# Patient Record
Sex: Male | Born: 1955
Health system: Southern US, Community
[De-identification: ages and names within clinical notes are randomized; demographics above are authoritative.]

## PROBLEM LIST (undated history)

## (undated) DIAGNOSIS — I509 Heart failure, unspecified: Secondary | ICD-10-CM

## (undated) DIAGNOSIS — M1990 Unspecified osteoarthritis, unspecified site: Secondary | ICD-10-CM

## (undated) DIAGNOSIS — I10 Essential (primary) hypertension: Secondary | ICD-10-CM

## (undated) DIAGNOSIS — E871 Hypo-osmolality and hyponatremia: Secondary | ICD-10-CM

## (undated) DIAGNOSIS — C61 Malignant neoplasm of prostate: Secondary | ICD-10-CM

## (undated) DIAGNOSIS — K219 Gastro-esophageal reflux disease without esophagitis: Secondary | ICD-10-CM

## (undated) HISTORY — PX: SHOULDER SURGERY: SHX246

---

## 1999-09-05 ENCOUNTER — Emergency Department (HOSPITAL_COMMUNITY): Admission: EM | Admit: 1999-09-05 | Discharge: 1999-09-05 | Payer: Self-pay | Admitting: Emergency Medicine

## 2000-08-14 ENCOUNTER — Ambulatory Visit (HOSPITAL_COMMUNITY): Admission: RE | Admit: 2000-08-14 | Discharge: 2000-08-14 | Payer: Self-pay | Admitting: Gastroenterology

## 2000-08-14 ENCOUNTER — Encounter (INDEPENDENT_AMBULATORY_CARE_PROVIDER_SITE_OTHER): Payer: Self-pay | Admitting: *Deleted

## 2004-08-28 ENCOUNTER — Emergency Department (HOSPITAL_COMMUNITY): Admission: EM | Admit: 2004-08-28 | Discharge: 2004-08-28 | Payer: Self-pay | Admitting: Advanced Practice Midwife

## 2007-08-27 ENCOUNTER — Emergency Department (HOSPITAL_COMMUNITY): Admission: EM | Admit: 2007-08-27 | Discharge: 2007-08-28 | Payer: Self-pay | Admitting: Emergency Medicine

## 2010-07-29 ENCOUNTER — Emergency Department (HOSPITAL_COMMUNITY): Admission: EM | Admit: 2010-07-29 | Discharge: 2010-07-29 | Payer: Self-pay | Admitting: Emergency Medicine

## 2011-04-26 NOTE — Procedures (Signed)
[place]  Patient:    Austin [name], Austin [name]                     MRN: [id number] Proc. Date: 08/14/00 Adm. Date:  [id number] Attending:  Charna Elizabeth CC:         Kern Reap, M.D.   Procedure Report  DATE OF BIRTH:  [birth year]  REFERRING PHYSICIAN:  Kern Reap, M.D.  PROCEDURE PERFORMED:  Colonoscopy with biopsy x 1.  ENDOSCOPIST:  Anselmo Rod, M.D.  INSTRUMENT USED:  Olympus video colonoscope.  INDICATIONS FOR PROCEDURE:  Rectal bleeding in a 54 year old black male, rule out colonic polyps, masses, hemorrhoids, etc.  PREPROCEDURE PREPARATION:  Informed consent was procured from the patient. The patient was fasted for eight hours prior to the procedure and prepped with a bottle of magnesium citrate and a gallon of NuLytely the night prior to the procedure.  PREPROCEDURE PHYSICAL:  The patient had stable vital signs.  Neck supple. Chest clear to auscultation.  S1, S2 regular.  Abdomen soft with normal abdominal bowel sounds.  DESCRIPTION OF PROCEDURE:  The patient was placed in the left lateral decubitus position and sedated with 75 mg of Demerol and 7 mg of Versed intravenously.  Once the patient was adequately sedated and maintained on low-flow oxygen and continuous cardiac monitoring, the Olympus video colonoscope was advanced from the rectum to the cecum without difficulty.  The entire colonic mucosa appeared healthy with a normal vascular pattern except for one small sessile polyp that was removed by core biopsy from 10 cm.  There were moderate sized internal hemorrhoids.  The patient tolerated the procedure well without complications.  No large masses or polyps were seen.  IMPRESSION: 1. Healthy-appearing colon except for a small sessile polyp removed by cold    biopsy forceps from 10 cm. 2. Moderate sized nonbleeding internal hemorrhoids. 3. Otherwise normal-appearing colon up to cecum.  RECOMMENDATIONS: 1. The  patient has been advised to increase the fluid and fiber in his diet. 2. Await pathology results. 3. Outpatient follow-up in the next two weeks.DD:  08/14/00 TD:  08/15/00 Job: [id number]

## 2011-09-19 LAB — URINALYSIS, ROUTINE W REFLEX MICROSCOPIC
Glucose, UA: NEGATIVE
Hgb urine dipstick: NEGATIVE
Ketones, ur: >80 — AB
Protein, ur: 30 — AB

## 2011-09-19 LAB — LIPASE, BLOOD: Lipase: 25

## 2011-09-19 LAB — DIFFERENTIAL
Basophils Absolute: 0
Eosinophils Absolute: 0
Monocytes Absolute: 0.3
Neutrophils Relative %: 92 — ABNORMAL HIGH

## 2011-09-19 LAB — BASIC METABOLIC PANEL
CO2: 27
Calcium: 9.1
Chloride: 94 — ABNORMAL LOW
GFR calc Af Amer: >60
Glucose, Bld: 120 — ABNORMAL HIGH
Potassium: 3.9
Sodium: 134 — ABNORMAL LOW

## 2011-09-19 LAB — CBC
HCT: 38.2 — ABNORMAL LOW
Hemoglobin: 13.2
MCHC: 34.6
Platelets: 198
RBC: 4.15 — ABNORMAL LOW
RDW: 14.6 — ABNORMAL HIGH

## 2011-09-19 LAB — URINE MICROSCOPIC-ADD ON

## 2014-01-12 ENCOUNTER — Other Ambulatory Visit: Payer: Self-pay | Admitting: Urology

## 2014-01-19 ENCOUNTER — Encounter (HOSPITAL_COMMUNITY): Payer: Self-pay | Admitting: Pharmacy Technician

## 2014-01-24 ENCOUNTER — Encounter (HOSPITAL_COMMUNITY)
Admission: RE | Admit: 2014-01-24 | Discharge: 2014-01-24 | Disposition: A | Discharge status: Home / Self Care | Payer: 59 | Source: Ambulatory Visit | Attending: Urology | Admitting: Urology

## 2014-01-24 ENCOUNTER — Encounter (HOSPITAL_COMMUNITY): Payer: Self-pay

## 2014-01-24 ENCOUNTER — Ambulatory Visit (HOSPITAL_COMMUNITY)
Admission: RE | Admit: 2014-01-24 | Discharge: 2014-01-24 | Disposition: A | Discharge status: Home / Self Care | Payer: 59 | Source: Ambulatory Visit | Attending: Urology | Admitting: Urology

## 2014-01-24 DIAGNOSIS — Z01810 Encounter for preprocedural cardiovascular examination: Secondary | ICD-10-CM | POA: Insufficient documentation

## 2014-01-24 DIAGNOSIS — Z01812 Encounter for preprocedural laboratory examination: Secondary | ICD-10-CM | POA: Insufficient documentation

## 2014-01-24 HISTORY — DX: Gastro-esophageal reflux disease without esophagitis: K21.9

## 2014-01-24 HISTORY — DX: Malignant neoplasm of prostate: C61

## 2014-01-24 HISTORY — DX: Essential (primary) hypertension: I10

## 2014-01-24 HISTORY — DX: Unspecified osteoarthritis, unspecified site: M19.90

## 2014-01-24 LAB — BASIC METABOLIC PANEL
BUN: 13 mg/dL (ref 6–23)
CHLORIDE: 97 meq/L (ref 96–112)
CO2: 26 meq/L (ref 19–32)
Calcium: 9.4 mg/dL (ref 8.4–10.5)
Creatinine, Ser: 1.04 mg/dL (ref 0.50–1.35)
GFR calc Af Amer: 90 mL/min — ABNORMAL LOW (ref >90)
GFR, EST NON AFRICAN AMERICAN: 77 mL/min — AB (ref >90)
GLUCOSE: 85 mg/dL (ref 70–99)
POTASSIUM: 3.9 meq/L (ref 3.7–5.3)
Sodium: 138 mEq/L (ref 137–147)

## 2014-01-24 LAB — CBC
HCT: 32.3 % — ABNORMAL LOW (ref 39.0–52.0)
HEMOGLOBIN: 10.9 g/dL — AB (ref 13.0–17.0)
MCH: 32.2 pg (ref 26.0–34.0)
MCHC: 33.7 g/dL (ref 30.0–36.0)
MCV: 95.6 fL (ref 78.0–100.0)
PLATELETS: 250 10*3/uL (ref 150–400)
RBC: 3.38 MIL/uL — AB (ref 4.22–5.81)
RDW: 12.9 % (ref 11.5–15.5)
WBC: 7.1 10*3/uL (ref 4.0–10.5)

## 2014-01-24 LAB — ABO/RH: ABO/RH(D): B NEG

## 2014-01-24 NOTE — Patient Instructions (Addendum)
19 Austin [name]  01/24/2014   Your procedure is scheduled on: 01/26/14  Report to [facility] at 06:30 AM.  Call this number if you have problems the morning of surgery [phone]: [phone]   Remember:   Do not eat food or drink liquids After Midnight.   Do not wear jewelry, make-up or nail polish.  Do not wear lotions, powders, or perfumes. You may wear deodorant.  Do not shave 48 hours prior to surgery. Men may shave face and neck.  Do not bring valuables to the hospital.  Contacts, dentures or bridgework may not be worn into surgery.  Leave suitcase in the car. After surgery it may be brought to your room.  For patients admitted to the hospital, checkout time is 11:00 AM the day of discharge.    Please read over the following fact sheets that you were given:[name] preparing for surgery sheet, blood fact sheet [name], RN  pre op nurse call if needed [phone]    FAILURE TO [name]   Patient Signature: ___________________________________________

## 2014-01-26 ENCOUNTER — Encounter (HOSPITAL_COMMUNITY)
Admission: RE | Disposition: A | Discharge status: Home / Self Care | Payer: Self-pay | Source: Ambulatory Visit | Attending: Urology

## 2014-01-26 ENCOUNTER — Inpatient Hospital Stay (HOSPITAL_COMMUNITY): Payer: 59 | Admitting: Certified Registered Nurse Anesthetist

## 2014-01-26 ENCOUNTER — Encounter (HOSPITAL_COMMUNITY): Payer: Self-pay | Admitting: Certified Registered Nurse Anesthetist

## 2014-01-26 ENCOUNTER — Inpatient Hospital Stay (HOSPITAL_COMMUNITY)
Admission: RE | Admit: 2014-01-26 | Discharge: 2014-01-27 | DRG: 708 | Disposition: A | Discharge status: Home / Self Care | Payer: 59 | Source: Ambulatory Visit | Attending: Urology | Admitting: Urology

## 2014-01-26 ENCOUNTER — Encounter (HOSPITAL_COMMUNITY): Payer: 59 | Admitting: Certified Registered Nurse Anesthetist

## 2014-01-26 DIAGNOSIS — I10 Essential (primary) hypertension: Secondary | ICD-10-CM | POA: Diagnosis present

## 2014-01-26 DIAGNOSIS — C61 Malignant neoplasm of prostate: Principal | ICD-10-CM | POA: Diagnosis present

## 2014-01-26 DIAGNOSIS — K219 Gastro-esophageal reflux disease without esophagitis: Secondary | ICD-10-CM | POA: Diagnosis present

## 2014-01-26 DIAGNOSIS — N529 Male erectile dysfunction, unspecified: Secondary | ICD-10-CM | POA: Diagnosis present

## 2014-01-26 DIAGNOSIS — Z79899 Other long term (current) drug therapy: Secondary | ICD-10-CM

## 2014-01-26 HISTORY — PX: ROBOT ASSISTED LAPAROSCOPIC RADICAL PROSTATECTOMY: SHX5141

## 2014-01-26 HISTORY — PX: LYMPHADENECTOMY: SHX5960

## 2014-01-26 LAB — TYPE AND SCREEN
ABO/RH(D): B NEG
ANTIBODY SCREEN: NEGATIVE

## 2014-01-26 LAB — HEMOGLOBIN AND HEMATOCRIT, BLOOD
HCT: 31.5 % — ABNORMAL LOW (ref 39.0–52.0)
Hemoglobin: 10.5 g/dL — ABNORMAL LOW (ref 13.0–17.0)

## 2014-01-26 SURGERY — ROBOTIC ASSISTED LAPAROSCOPIC RADICAL PROSTATECTOMY
Anesthesia: General

## 2014-01-26 MED ORDER — KCL IN DEXTROSE-NACL 20-5-0.45 MEQ/L-%-% IV SOLN
INTRAVENOUS | Status: DC
  Administered 2014-01-26 – 2014-01-27 (×2): via INTRAVENOUS
  Filled 2014-01-26 (×4): qty 1000

## 2014-01-26 MED ORDER — PROPOFOL 10 MG/ML IV BOLUS
INTRAVENOUS | Status: AC
  Filled 2014-01-26: qty 20

## 2014-01-26 MED ORDER — HYDROCODONE-ACETAMINOPHEN 5-325 MG PO TABS: 1.0000 | ORAL_TABLET | Freq: Four times a day (QID) | ORAL | Status: DC | PRN

## 2014-01-26 MED ORDER — DEXAMETHASONE SODIUM PHOSPHATE 10 MG/ML IJ SOLN
INTRAMUSCULAR | Status: DC | PRN
  Administered 2014-01-26: 10 mg via INTRAVENOUS

## 2014-01-26 MED ORDER — LOSARTAN POTASSIUM 50 MG PO TABS
100.0000 mg | ORAL_TABLET | Freq: Every day | ORAL | Status: DC
  Administered 2014-01-26 – 2014-01-27 (×2): 100 mg via ORAL
  Filled 2014-01-26 (×3): qty 2

## 2014-01-26 MED ORDER — KETOROLAC TROMETHAMINE 30 MG/ML IJ SOLN: 15.0000 mg | Freq: Once | INTRAMUSCULAR | Status: DC | PRN

## 2014-01-26 MED ORDER — FENTANYL CITRATE 0.05 MG/ML IJ SOLN
INTRAMUSCULAR | Status: AC
  Filled 2014-01-26: qty 5

## 2014-01-26 MED ORDER — ONDANSETRON HCL 4 MG/2ML IJ SOLN
INTRAMUSCULAR | Status: DC | PRN
  Administered 2014-01-26 (×2): 2 mg via INTRAVENOUS

## 2014-01-26 MED ORDER — SODIUM CHLORIDE 0.9 % IV SOLN
Freq: Once | INTRAVENOUS | Status: AC
  Administered 2014-01-26: 1000 mL via INTRAVENOUS

## 2014-01-26 MED ORDER — ONDANSETRON HCL 4 MG/2ML IJ SOLN
INTRAMUSCULAR | Status: AC
  Filled 2014-01-26: qty 2

## 2014-01-26 MED ORDER — HYDRALAZINE HCL 20 MG/ML IJ SOLN
INTRAMUSCULAR | Status: DC | PRN
  Administered 2014-01-26: 10 mg via INTRAVENOUS

## 2014-01-26 MED ORDER — SUCCINYLCHOLINE CHLORIDE 20 MG/ML IJ SOLN
INTRAMUSCULAR | Status: DC | PRN
  Administered 2014-01-26: 100 mg via INTRAVENOUS

## 2014-01-26 MED ORDER — EPHEDRINE SULFATE 50 MG/ML IJ SOLN
INTRAMUSCULAR | Status: AC
  Filled 2014-01-26: qty 1

## 2014-01-26 MED ORDER — LIDOCAINE HCL (CARDIAC) 20 MG/ML IV SOLN
INTRAVENOUS | Status: DC | PRN
  Administered 2014-01-26: 100 mg via INTRAVENOUS

## 2014-01-26 MED ORDER — HYDROCHLOROTHIAZIDE 25 MG PO TABS
25.0000 mg | ORAL_TABLET | Freq: Every day | ORAL | Status: DC
  Administered 2014-01-26 – 2014-01-27 (×2): 25 mg via ORAL
  Filled 2014-01-26 (×3): qty 1

## 2014-01-26 MED ORDER — MIDAZOLAM HCL 2 MG/2ML IJ SOLN
INTRAMUSCULAR | Status: AC
  Filled 2014-01-26: qty 2

## 2014-01-26 MED ORDER — ATROPINE SULFATE 0.4 MG/ML IJ SOLN
INTRAMUSCULAR | Status: AC
  Filled 2014-01-26: qty 1

## 2014-01-26 MED ORDER — ACETAMINOPHEN 500 MG PO TABS
1000.0000 mg | ORAL_TABLET | Freq: Four times a day (QID) | ORAL | Status: AC
  Administered 2014-01-26 – 2014-01-27 (×3): 1000 mg via ORAL
  Filled 2014-01-26 (×3): qty 2

## 2014-01-26 MED ORDER — SENNA 8.6 MG PO TABS
1.0000 | ORAL_TABLET | Freq: Two times a day (BID) | ORAL | Status: DC
  Administered 2014-01-27 (×2): 8.6 mg via ORAL
  Filled 2014-01-26 (×2): qty 1

## 2014-01-26 MED ORDER — GLYCOPYRROLATE 0.2 MG/ML IJ SOLN
INTRAMUSCULAR | Status: DC | PRN
  Administered 2014-01-26: 0.2 mg via INTRAVENOUS
  Administered 2014-01-26: .2 mg via INTRAVENOUS

## 2014-01-26 MED ORDER — ONDANSETRON HCL 4 MG/2ML IJ SOLN: 4.0000 mg | INTRAMUSCULAR | Status: DC | PRN

## 2014-01-26 MED ORDER — HYDROMORPHONE HCL PF 1 MG/ML IJ SOLN
INTRAMUSCULAR | Status: AC
  Filled 2014-01-26: qty 1

## 2014-01-26 MED ORDER — CISATRACURIUM BESYLATE (PF) 10 MG/5ML IV SOLN
INTRAVENOUS | Status: DC | PRN
  Administered 2014-01-26: 2 mg via INTRAVENOUS
  Administered 2014-01-26 (×2): 4 mg via INTRAVENOUS
  Administered 2014-01-26 (×2): 2 mg via INTRAVENOUS
  Administered 2014-01-26: 10 mg via INTRAVENOUS

## 2014-01-26 MED ORDER — HYDROMORPHONE HCL PF 1 MG/ML IJ SOLN
0.5000 mg | INTRAMUSCULAR | Status: DC | PRN
  Administered 2014-01-26 – 2014-01-27 (×2): 1 mg via INTRAVENOUS
  Filled 2014-01-26 (×2): qty 1

## 2014-01-26 MED ORDER — CIPROFLOXACIN HCL 500 MG PO TABS: 500.0000 mg | ORAL_TABLET | Freq: Two times a day (BID) | ORAL | Status: DC

## 2014-01-26 MED ORDER — NEOSTIGMINE METHYLSULFATE 1 MG/ML IJ SOLN
INTRAMUSCULAR | Status: AC
  Filled 2014-01-26: qty 10

## 2014-01-26 MED ORDER — HYDRALAZINE HCL 20 MG/ML IJ SOLN
INTRAMUSCULAR | Status: AC
  Filled 2014-01-26: qty 1

## 2014-01-26 MED ORDER — PROMETHAZINE HCL 25 MG/ML IJ SOLN: 6.2500 mg | INTRAMUSCULAR | Status: DC | PRN

## 2014-01-26 MED ORDER — LABETALOL HCL 5 MG/ML IV SOLN
INTRAVENOUS | Status: DC | PRN
  Administered 2014-01-26 (×2): 2.5 mg via INTRAVENOUS
  Administered 2014-01-26: 5 mg via INTRAVENOUS

## 2014-01-26 MED ORDER — HYDROMORPHONE HCL PF 1 MG/ML IJ SOLN
0.2500 mg | INTRAMUSCULAR | Status: DC | PRN
  Administered 2014-01-26 (×2): 0.5 mg via INTRAVENOUS

## 2014-01-26 MED ORDER — NEOSTIGMINE METHYLSULFATE 1 MG/ML IJ SOLN
INTRAMUSCULAR | Status: DC | PRN
  Administered 2014-01-26: 4 mg via INTRAVENOUS

## 2014-01-26 MED ORDER — FENTANYL CITRATE 0.05 MG/ML IJ SOLN
INTRAMUSCULAR | Status: DC | PRN
  Administered 2014-01-26: 50 ug via INTRAVENOUS
  Administered 2014-01-26: 150 ug via INTRAVENOUS
  Administered 2014-01-26: 100 ug via INTRAVENOUS
  Administered 2014-01-26 (×4): 50 ug via INTRAVENOUS

## 2014-01-26 MED ORDER — GLYCOPYRROLATE 0.2 MG/ML IJ SOLN
INTRAMUSCULAR | Status: AC
  Filled 2014-01-26: qty 3

## 2014-01-26 MED ORDER — BUPIVACAINE LIPOSOME 1.3 % IJ SUSP
20.0000 mL | Freq: Once | INTRAMUSCULAR | Status: DC
  Filled 2014-01-26: qty 20

## 2014-01-26 MED ORDER — LACTATED RINGERS IR SOLN
Status: DC | PRN
  Administered 2014-01-26: 1000 mL

## 2014-01-26 MED ORDER — LACTATED RINGERS IV SOLN
INTRAVENOUS | Status: DC | PRN
  Administered 2014-01-26 (×3): via INTRAVENOUS

## 2014-01-26 MED ORDER — CEFAZOLIN SODIUM-DEXTROSE 2-3 GM-% IV SOLR
INTRAVENOUS | Status: AC
  Filled 2014-01-26: qty 50

## 2014-01-26 MED ORDER — KCL IN DEXTROSE-NACL 20-5-0.45 MEQ/L-%-% IV SOLN
INTRAVENOUS | Status: AC
  Filled 2014-01-26: qty 1000

## 2014-01-26 MED ORDER — PROPOFOL 10 MG/ML IV BOLUS
INTRAVENOUS | Status: DC | PRN
  Administered 2014-01-26: 200 mg via INTRAVENOUS
  Administered 2014-01-26 (×2): 25 mg via INTRAVENOUS

## 2014-01-26 MED ORDER — LABETALOL HCL 5 MG/ML IV SOLN
INTRAVENOUS | Status: AC
  Filled 2014-01-26: qty 4

## 2014-01-26 MED ORDER — BUPIVACAINE LIPOSOME 1.3 % IJ SUSP
INTRAMUSCULAR | Status: DC | PRN
  Administered 2014-01-26: 20 mL

## 2014-01-26 MED ORDER — CISATRACURIUM BESYLATE 20 MG/10ML IV SOLN
INTRAVENOUS | Status: AC
  Filled 2014-01-26: qty 10

## 2014-01-26 MED ORDER — LIDOCAINE HCL (CARDIAC) 20 MG/ML IV SOLN
INTRAVENOUS | Status: AC
  Filled 2014-01-26: qty 5

## 2014-01-26 MED ORDER — SODIUM CHLORIDE 0.9 % IJ SOLN
INTRAMUSCULAR | Status: AC
  Filled 2014-01-26: qty 10

## 2014-01-26 MED ORDER — CEFAZOLIN SODIUM-DEXTROSE 2-3 GM-% IV SOLR
2.0000 g | INTRAVENOUS | Status: AC
  Administered 2014-01-26: 2 g via INTRAVENOUS

## 2014-01-26 MED ORDER — OXYCODONE HCL 5 MG PO TABS
5.0000 mg | ORAL_TABLET | ORAL | Status: DC | PRN
  Administered 2014-01-26 – 2014-01-27 (×4): 5 mg via ORAL
  Filled 2014-01-26 (×4): qty 1

## 2014-01-26 MED ORDER — DOCUSATE SODIUM 100 MG PO CAPS
100.0000 mg | ORAL_CAPSULE | Freq: Two times a day (BID) | ORAL | Status: DC
  Administered 2014-01-27 (×2): 100 mg via ORAL
  Filled 2014-01-26 (×3): qty 1

## 2014-01-26 MED ORDER — SODIUM CHLORIDE 0.9 % IJ SOLN
INTRAMUSCULAR | Status: AC
  Filled 2014-01-26: qty 20

## 2014-01-26 MED ORDER — MIDAZOLAM HCL 5 MG/5ML IJ SOLN
INTRAMUSCULAR | Status: DC | PRN
  Administered 2014-01-26: 0.5 mg via INTRAVENOUS
  Administered 2014-01-26: 1 mg via INTRAVENOUS
  Administered 2014-01-26: 0.5 mg via INTRAVENOUS

## 2014-01-26 MED ORDER — FENTANYL CITRATE 0.05 MG/ML IJ SOLN
INTRAMUSCULAR | Status: AC
  Filled 2014-01-26: qty 2

## 2014-01-26 MED ORDER — INDOCYANINE GREEN 25 MG IV SOLR
INTRAVENOUS | Status: DC | PRN
  Administered 2014-01-26: .4 mg

## 2014-01-26 MED ORDER — LOSARTAN POTASSIUM-HCTZ 100-25 MG PO TABS: 1.0000 | ORAL_TABLET | Freq: Every morning | ORAL | Status: DC

## 2014-01-26 MED ORDER — DEXAMETHASONE SODIUM PHOSPHATE 10 MG/ML IJ SOLN
INTRAMUSCULAR | Status: AC
  Filled 2014-01-26: qty 1

## 2014-01-26 SURGICAL SUPPLY — 54 items
ADH SKN CLS APL DERMABOND .7 (GAUZE/BANDAGES/DRESSINGS) ×2
CABLE HIGH FREQUENCY MONO STRZ (ELECTRODE) ×4 IMPLANT
CANISTER SUCTION 2500CC (MISCELLANEOUS) ×4 IMPLANT
CATH FOLEY 2WAY SLVR 18FR 30CC (CATHETERS) ×4 IMPLANT
CATH TIEMANN FOLEY 18FR 5CC (CATHETERS) ×4 IMPLANT
CHLORAPREP W/TINT 26ML (MISCELLANEOUS) ×4 IMPLANT
CLIP LIGATING HEM O LOK PURPLE (MISCELLANEOUS) ×8 IMPLANT
CLIP LIGATING HEMO LOK XL GOLD (MISCELLANEOUS) ×4 IMPLANT
CLOTH BEACON ORANGE TIMEOUT ST (SAFETY) ×4 IMPLANT
CONT SPECI 4OZ STER CLIK (MISCELLANEOUS) ×4 IMPLANT
COVER SURGICAL LIGHT HANDLE (MISCELLANEOUS) ×4 IMPLANT
COVER TIP SHEARS 8 DVNC (MISCELLANEOUS) ×2 IMPLANT
COVER TIP SHEARS 8MM DA VINCI (MISCELLANEOUS) ×2
CUTTER ECHEON FLEX ENDO 45 340 (ENDOMECHANICALS) ×4 IMPLANT
DECANTER SPIKE VIAL GLASS SM (MISCELLANEOUS) ×4 IMPLANT
DERMABOND ADVANCED (GAUZE/BANDAGES/DRESSINGS) ×2
DERMABOND ADVANCED .7 DNX12 (GAUZE/BANDAGES/DRESSINGS) ×2 IMPLANT
DRAPE SURG IRRIG POUCH 19X23 (DRAPES) ×4 IMPLANT
DRSG TEGADERM 2-3/8X2-3/4 SM (GAUZE/BANDAGES/DRESSINGS) ×16 IMPLANT
DRSG TEGADERM 4X4.75 (GAUZE/BANDAGES/DRESSINGS) ×8 IMPLANT
DRSG TEGADERM 6X8 (GAUZE/BANDAGES/DRESSINGS) ×8 IMPLANT
ELECT REM PT RETURN 9FT ADLT (ELECTROSURGICAL) ×4
ELECTRODE REM PT RTRN 9FT ADLT (ELECTROSURGICAL) ×2 IMPLANT
GAUZE SPONGE 2X2 8PLY STRL LF (GAUZE/BANDAGES/DRESSINGS) ×2 IMPLANT
GLOVE BIO SURGEON STRL SZ 6.5 (GLOVE) ×3 IMPLANT
GLOVE BIO SURGEONS STRL SZ 6.5 (GLOVE) ×1
GLOVE BIOGEL M STRL SZ7.5 (GLOVE) ×12 IMPLANT
GOWN STRL REUS W/TWL LRG LVL3 (GOWN DISPOSABLE) ×8 IMPLANT
GOWN STRL REUS W/TWL XL LVL3 (GOWN DISPOSABLE) ×8 IMPLANT
HEMOSTAT SURGICEL 4X8 (HEMOSTASIS) ×4 IMPLANT
HOLDER FOLEY CATH W/STRAP (MISCELLANEOUS) ×4 IMPLANT
IV LACTATED RINGERS 1000ML (IV SOLUTION) ×4 IMPLANT
KIT ACCESSORY DA VINCI DISP (KITS) ×2
KIT ACCESSORY DVNC DISP (KITS) ×2 IMPLANT
KIT PROCEDURE DA VINCI SI (MISCELLANEOUS) ×2
KIT PROCEDURE DVNC SI (MISCELLANEOUS) ×2 IMPLANT
NEEDLE INSUFFLATION 14GA 120MM (NEEDLE) ×4 IMPLANT
NEEDLE SPNL 22GX7 SPINOC (NEEDLE) ×4 IMPLANT
PACK ROBOT UROLOGY CUSTOM (CUSTOM PROCEDURE TRAY) ×4 IMPLANT
RELOAD GREEN ECHELON 45 (STAPLE) ×4 IMPLANT
SET TUBE IRRIG SUCTION NO TIP (IRRIGATION / IRRIGATOR) ×4 IMPLANT
SOLUTION ELECTROLUBE (MISCELLANEOUS) ×4 IMPLANT
SPONGE GAUZE 2X2 STER 10/PKG (GAUZE/BANDAGES/DRESSINGS) ×2
SPONGE LAP 4X18 X RAY DECT (DISPOSABLE) ×4 IMPLANT
SUT ETHILON 3 0 PS 1 (SUTURE) ×4 IMPLANT
SUT MNCRL AB 4-0 PS2 18 (SUTURE) ×8 IMPLANT
SUT PDS AB 1 CT1 27 (SUTURE) ×8 IMPLANT
SUT VICRYL 0 UR6 27IN ABS (SUTURE) ×4 IMPLANT
SUT VLOC BARB 180 ABS3/0GR12 (SUTURE) ×12
SUTURE VLOC BRB 180 ABS3/0GR12 (SUTURE) ×6 IMPLANT
SYR 27GX1/2 1ML LL SAFETY (SYRINGE) ×4 IMPLANT
TOWEL OR NON WOVEN STRL DISP B (DISPOSABLE) ×4 IMPLANT
TROCAR 12M 150ML BLUNT (TROCAR) ×4 IMPLANT
WATER STERILE IRR 1500ML POUR (IV SOLUTION) ×8 IMPLANT

## 2014-01-26 NOTE — Transfer of Care (Signed)
Immediate Anesthesia Transfer of Care Note  Patient: Austin [name]  Procedure(s) Performed: Procedure(s): ROBOTIC ASSISTED LAPAROSCOPIC RADICAL PROSTATECTOMY (N/A) LYMPHADENECTOMY WITH INDOCYANINE GREEN DYE (Bilateral)  Patient Location: PACU  Anesthesia Type:General  Level of Consciousness: awake, alert , oriented, patient cooperative and responds to stimulation  Airway & Oxygen Therapy: Patient Spontanous Breathing and Patient connected to face mask oxygen  Post-op Assessment: Report given to PACU RN, Post -op Vital signs reviewed and stable and Patient moving all extremities  Post vital signs: Reviewed and stable  Complications: No apparent anesthesia complications

## 2014-01-26 NOTE — Brief Op Note (Signed)
01/26/2014  12:19 PM  PATIENT:  Austin [name]  58 y.o. male  PRE-OPERATIVE DIAGNOSIS:  PROSTATE CANCER  POST-OPERATIVE DIAGNOSIS:  PROSTATE CANCER  PROCEDURE:  Procedure(s): ROBOTIC ASSISTED LAPAROSCOPIC RADICAL PROSTATECTOMY (N/A) LYMPHADENECTOMY WITH INDOCYANINE GREEN DYE (Bilateral)  SURGEON:  Surgeon(s) and Role:    * Alexis Frock, MD - Primary  PHYSICIAN ASSISTANT:   ASSISTANTS: Felipa Furnace, PA   ANESTHESIA:   local and general  EBL:  Total I/O In: 1000 [I.V.:1000] Out: 50 [Blood:50]  BLOOD ADMINISTERED:none  DRAINS: 1 - JP to bulb suction, 2 - Foley to straight drain   LOCAL MEDICATIONS USED:  MARCAINE     SPECIMEN:  Source of Specimen:  1 - Bilateral pelvic lymph nodes, 2 - periprostatic fat, 3- Posterior blader neck margin and revised, 4- Rt periviescial sentinal node, 5 - radical prostatectomy  DISPOSITION OF SPECIMEN:  PATHOLOGY  COUNTS:  YES  TOURNIQUET:  * No tourniquets in log *  DICTATION: .Other Dictation: Dictation Number [id number]  PLAN OF CARE: Admit to inpatient   PATIENT DISPOSITION:  PACU - hemodynamically stable.   Delay start of Pharmacological VTE agent (>24hrs) due to surgical blood loss or risk of bleeding: not applicable

## 2014-01-26 NOTE — Anesthesia Preprocedure Evaluation (Signed)
Anesthesia Evaluation  Patient identified by MRN, date of birth, ID band Patient awake    Reviewed: Allergy & Precautions, H&P , NPO status , Patient's Chart, lab work & pertinent test results  Airway Mallampati: II  TM Distance: <3 FB Neck ROM: Full    Dental no notable dental hx.    Pulmonary neg pulmonary ROS,  breath sounds clear to auscultation  Pulmonary exam normal       Cardiovascular hypertension, Pt. on medications Rhythm:Regular Rate:Normal     Neuro/Psych negative neurological ROS  negative psych ROS   GI/Hepatic negative GI ROS, Neg liver ROS,   Endo/Other  negative endocrine ROS  Renal/GU negative Renal ROS  negative genitourinary   Musculoskeletal negative musculoskeletal ROS (+)   Abdominal   Peds negative pediatric ROS (+)  Hematology  (+) anemia ,   Anesthesia Other Findings   Reproductive/Obstetrics negative OB ROS                             Anesthesia Physical Anesthesia Plan  ASA: II  Anesthesia Plan: General   Post-op Pain Management:    Induction: Intravenous  Airway Management Planned: Oral ETT  Additional Equipment:   Intra-op Plan:   Post-operative Plan: Extubation in OR  Informed Consent: I have reviewed the patients History and Physical, chart, labs and discussed the procedure including the risks, benefits and alternatives for the proposed anesthesia with the patient or authorized representative who has indicated his/her understanding and acceptance.   Dental advisory given  Plan Discussed with: CRNA and Surgeon  Anesthesia Plan Comments:         Anesthesia Quick Evaluation  

## 2014-01-26 NOTE — Anesthesia Procedure Notes (Signed)
Procedure Name: Intubation Date/Time: 01/26/2014 9:15 AM Performed by: Ofilia Neas Pre-anesthesia Checklist: Patient identified, Emergency Drugs available, Suction available, Patient being monitored and Timeout performed Patient Re-evaluated:Patient Re-evaluated prior to inductionPreoxygenation: Pre-oxygenation with 100% oxygen Intubation Type: IV induction Ventilation: Mask ventilation without difficulty Laryngoscope Size: Mac and 4 Grade View: Grade II Tube type: Oral Tube size: 7.5 mm Number of attempts: 1 Airway Equipment and Method: Stylet Placement Confirmation: ETT inserted through vocal cords under direct vision and positive ETCO2 Secured at: 20 cm Tube secured with: Tape Dental Injury: Teeth and Oropharynx as per pre-operative assessment

## 2014-01-26 NOTE — H&P (Signed)
Austin [name] is an 58 y.o. male.    Chief Complaint: Pre-Op Robotic Prostatectomy  HPI:    1 - Large Volume Moderate Risk Prostate Cancer - Pt with Gleason 4+3=7 in RMA, RLA; Gleason 3+4=7 in RMM RMB; Gleason 3+3=6 in ALL others by prostate biopsy 11/2013 on evaluation of PSA 4.9. TRUS volume 19m, no medial lobe.  2 - Erectile Dysfunction - Pt with slowly progressive decline in ability to achieve and maintain erection. Presently adequate for intercourse "most but not all" of the time. No prior therapy. Libido preserved.   PMH sig for HTN. No prior surgery. No CV disease.   Today KMorisis seen to proceed with prostatectomy. No interval fevers.  Past Medical History  Diagnosis Date  . Prostate cancer   . Hypertension   . GERD (gastroesophageal reflux disease)   . Arthritis     hands    Past Surgical History  Procedure Laterality Date  . Shoulder surgery Left     No family history on file. Social History:  reports that he has never smoked. He has never used smokeless tobacco. He reports that he drinks alcohol. He reports that he does not use illicit drugs.  Allergies: No Known Allergies  Medications Prior to Admission  Medication Sig Dispense Refill  . losartan-hydrochlorothiazide (HYZAAR) 100-25 MG per tablet Take 1 tablet by mouth every morning.      . Multiple Vitamin (MULTIVITAMIN WITH MINERALS) TABS tablet Take 1 tablet by mouth daily.        Results for orders placed during the hospital encounter of 01/24/14 (from the past 48 hour(s))  CBC     Status: Abnormal   Collection Time    01/24/14  3:05 PM      Result Value Ref Range   WBC 7.1  4.0 - 10.5 K/uL   RBC 3.38 (*) 4.22 - 5.81 MIL/uL   Hemoglobin 10.9 (*) 13.0 - 17.0 g/dL   HCT 32.3 (*) 39.0 - 52.0 %   MCV 95.6  78.0 - 100.0 fL   MCH 32.2  26.0 - 34.0 pg   MCHC 33.7  30.0 - 36.0 g/dL   RDW 12.9  11.5 - 15.5 %   Platelets 250  150 - 400 K/uL  BASIC METABOLIC PANEL     Status: Abnormal   Collection Time     01/24/14  3:05 PM      Result Value Ref Range   Sodium 138  137 - 147 mEq/L   Potassium 3.9  3.7 - 5.3 mEq/L   Chloride 97  96 - 112 mEq/L   CO2 26  19 - 32 mEq/L   Glucose, Bld 85  70 - 99 mg/dL   BUN 13  6 - 23 mg/dL   Creatinine, Ser 1.04  0.50 - 1.35 mg/dL   Calcium 9.4  8.4 - 10.5 mg/dL   GFR calc non Af Amer 77 (*) >90 mL/min   GFR calc Af Amer 90 (*) >90 mL/min   Comment: (NOTE)     The eGFR has been calculated using the CKD EPI equation.     This calculation has not been validated in all clinical situations.     eGFR's persistently <90 mL/min signify possible Chronic Kidney     Disease.  TYPE AND SCREEN     Status: None   Collection Time    01/24/14  3:05 PM      Result Value Ref Range   ABO/RH(D) B NEG  Antibody Screen NEG     Sample Expiration 02/07/2014    ABO/RH     Status: None   Collection Time    01/24/14  3:05 PM      Result Value Ref Range   ABO/RH(D) B NEG     Dg Chest 2 View  01/24/2014   CLINICAL DATA:  58 year old male preoperative study for prostatectomy. Initial encounter.  EXAM: CHEST  2 VIEW  COMPARISON:  07/29/2010.  FINDINGS: Larger lung volumes. Normal cardiac size and mediastinal contours. Visualized tracheal air column is within normal limits. The lungs are clear. No pneumothorax or effusion. Stable visualized osseous structures.  IMPRESSION: Negative, no acute cardiopulmonary abnormality.   Electronically Signed   By: Lars Pinks M.D.   On: 01/24/2014 16:16    Review of Systems  Constitutional: Negative.  Negative for fever and chills.  HENT: Negative.   Eyes: Negative.   Respiratory: Negative.   Cardiovascular: Negative.   Gastrointestinal: Negative.   Genitourinary: Negative.  Negative for hematuria and flank pain.  Musculoskeletal: Negative.   Skin: Negative.   Neurological: Negative.   Endo/Heme/Allergies: Negative.   Psychiatric/Behavioral: Negative.     Blood pressure 148/98, pulse 100, temperature 97.9 F (36.6 C),  temperature source Oral, resp. rate 18, SpO2 100.00%. Physical Exam  Constitutional: He is oriented to person, place, and time. He appears well-developed and well-nourished.  HENT:  Head: Normocephalic and atraumatic.  Eyes: EOM are normal. Pupils are equal, round, and reactive to light.  Neck: Normal range of motion. Neck supple.  Cardiovascular: Normal rate.   Respiratory: Effort normal. He has no wheezes.  GI: Soft. Bowel sounds are normal.  Genitourinary: Penis normal.  Musculoskeletal: Normal range of motion.  Neurological: He is alert and oriented to person, place, and time.  Skin: Skin is warm and dry.  Psychiatric: He has a normal mood and affect. His behavior is normal. Judgment and thought content normal.     Assessment/Plan  1 - Large Volume Moderate Risk Prostate Cancer - Although not "high risk" by criteria, is certainly very significant cancer in relatively young man with minimal comorbidity. We addressed that regardless of primary therapy I estimate approx 50% chance he will need adjuvant therapy.  We rediscussed prostatectomy and specifically robotic prostatectomy with bilateral pelvic lymphadenectomy being the technique that I most commonly perform. I showed the patient on their abdomen the approximately 6 small incision (trocar) sites as well as presumed extraction sites with robotic approach as well as possible open incision sites should open conversion be necessary. We rediscussed peri-operative risks including bleeding, infection, deep vein thrombosis, pulmonary embolism, compartment syndrome, nuropathy / neuropraxia, heart attack, stroke, death, as well as long-term risks such as non-cure / need for additional therapy. We specifically readdressed that the procedure would compromise urinary control leading to stress incontinence which typically resolves with time and pelvic rehabilitation (Kegel's, etc..), but can sometimes be permanent and require additional therapy  including surgery. We also specifically readdressed sexual sequellae including significant erectile dysfunction which typically partially resolves with time but can also be permanent and require additional therapy including surgery.   We rediscussed the typical hospital course including usual 1-2 night hospitalization, discharge with foley catheter in place usually for 1-2 weeks before voiding trial as well as usually 2 week recovery until able to perform most non-strenuous activity and 6 weeks until able to return to most jobs and more strenuous activity such as exercise. Pt voiced understanding and wants to proceed today as planned.  2 - Erectile Dysfunction - Presently modest bother. I very explicitly stated that this will be significantly worse post-op. He has very good understanding of this and his primary goal remains cancer control.     Kiylee Thoreson 01/26/2014, 6:32 AM

## 2014-01-26 NOTE — Preoperative (Signed)
Beta Blockers   Reason not to administer Beta Blockers:Not Applicable 

## 2014-01-26 NOTE — Discharge Instructions (Signed)
1. Activity:  You are encouraged to ambulate frequently (about every hour during waking hours) to help prevent blood clots from forming in your legs or lungs.  However, you should not engage in any heavy lifting (> 10-15 lbs), strenuous activity, or straining. °2. Diet: You should continue a clear liquid diet until passing gas from below.  Once this occurs, you may advance your diet to a soft diet that would be easy to digest (i.e soups, scrambled eggs, mashed potatoes, etc.) for 24 hours just as you would if getting over a bad stomach flu.  If tolerating this diet well for 24 hours, you may then begin eating regular food.  It will be normal to have some amount of bloating, nausea, and abdominal discomfort intermittently. °3. Prescriptions:  You will be provided a prescription for pain medication to take as needed.  If your pain is not severe enough to require the prescription pain medication, you may take extra strength Tylenol instead.  You should also take an over the counter stool softener (Colace 100 mg twice daily) to avoid straining with bowel movements as the pain medication may constipate you. Finally, you will also be provided a prescription for an antibiotic to begin the day prior to your return visit in the office for catheter removal. °4. Catheter care: You will be taught how to take care of the catheter by the nursing staff prior to discharge from the hospital.  You may use both a leg bag and the larger bedside bag but it is recommended to at least use the bigger bedside bag at nighttime as the leg bag is small and will fill up overnight and also does not drain as well when lying flat. You may periodically feel a strong urge to void with the catheter in place.  This is a bladder spasm and most often can occur when having a bowel movement or when you are moving around. It is typically self-limited and usually will stop after a few minutes.  You may use some Vaseline or Neosporin around the tip of the  catheter to reduce friction at the tip of the penis. °5. Incisions: You may remove your dressing bandages the 2nd day after surgery.  You most likely will have a few small staples in each of the incisions and once the bandages are removed, the incisions may stay open to air.  You may start showering (not soaking or bathing in water) 48 hours after surgery and the incisions simply need to be patted dry after the shower.  No additional care is needed. °6. What to call us about: You should call the office ([phone]) if you develop fever > 101, persistent vomiting, or the catheter stops draining. Also, feel free to call with any other questions you may have and remember the handout that was provided to you as a reference preoperatively which answers many of the common questions that arise after surgery. ° °You may resume aspirin, vitamins, and supplements 7 days after surgery. °

## 2014-01-27 ENCOUNTER — Encounter (HOSPITAL_COMMUNITY): Payer: Self-pay | Admitting: Urology

## 2014-01-27 LAB — BASIC METABOLIC PANEL
BUN: 10 mg/dL (ref 6–23)
CALCIUM: 8.5 mg/dL (ref 8.4–10.5)
CO2: 28 mEq/L (ref 19–32)
Chloride: 99 mEq/L (ref 96–112)
Creatinine, Ser: 0.98 mg/dL (ref 0.50–1.35)
GFR, EST NON AFRICAN AMERICAN: 89 mL/min — AB (ref >90)
Glucose, Bld: 159 mg/dL — ABNORMAL HIGH (ref 70–99)
POTASSIUM: 4.3 meq/L (ref 3.7–5.3)
Sodium: 139 mEq/L (ref 137–147)

## 2014-01-27 LAB — HEMOGLOBIN AND HEMATOCRIT, BLOOD
HCT: 28.8 % — ABNORMAL LOW (ref 39.0–52.0)
HEMOGLOBIN: 9.3 g/dL — AB (ref 13.0–17.0)

## 2014-01-27 NOTE — Progress Notes (Signed)
Urology Progress Note  Subjective:     No acute urologic events overnight. He has tolerated regular food. Negative flatus or BM. Negative nausea. Positive ambulation.   ROS: Negative: chest pain or SOB.  Objective:  Patient Vitals for the past 24 hrs:  BP Temp Temp src Pulse Resp SpO2 Height Weight  01/27/14 0550 131/88 mmHg 97.5 F (36.4 C) Oral 83 18 100 % - -  01/27/14 0149 113/74 mmHg 98.1 F (36.7 C) Oral 73 16 99 % - -  01/26/14 2134 114/69 mmHg 98.4 F (36.9 C) Oral 81 16 96 % - -  01/26/14 1354 - - - - - - 5\' 7"  (1.702 m) 72.4 kg (159 lb 9.8 oz)  01/26/14 1345 146/86 mmHg 97.4 F (36.3 C) - 104 16 100 % - -  01/26/14 1330 - 97.9 F (36.6 C) - - - - - -  01/26/14 1315 144/79 mmHg - - 94 17 100 % - -  01/26/14 1300 145/86 mmHg - - 90 13 100 % - -  01/26/14 1245 139/76 mmHg - - 82 17 100 % - -  01/26/14 1235 133/83 mmHg 97.6 F (36.4 C) - 84 15 100 % - -    Physical Exam: General:  No acute distress, awake Cardiovascular:    [x]   S1/S2 present, RRR  []   Irregularly irregular Chest:  CTA-B Abdomen:               []  Soft, appropriately TTP  []  Soft, NTTP  [x]  Soft, appropriately TTP, incision(s) clean/dry/intact, JP serosanguinous.  Genitourinary: Foley draining clear yellow urine.     I/O last 3 completed shifts: In: 4125 [I.V.:4125] Out: 1460 [WYOVZ:8588; Drains:190; Blood:50]  Recent Labs     01/24/14  1505  01/26/14  1250  01/27/14  0412  HGB  10.9*  10.5*  9.3*  WBC  7.1   --    --   PLT  250   --    --     Recent Labs     01/24/14  1505  01/27/14  0412  NA  138  139  K  3.9  4.3  CL  97  99  CO2  26  28  BUN  13  10  CREATININE  1.04  0.98  CALCIUM  9.4  8.5  GFRNONAA  77*  89*  GFRAA  90*  >90     No results found for this basename: PT, INR, APTT,  in the last 72 hours   No components found with this basename: ABG,     Length of stay: 1 days.  Assessment: Prostate cancer POD#1 Robotic radical prostatectomy w/ bilateral PLND  (Dr. Tresa Moore)   Plan: Continue ambulation.  D/c JP drain.   Saline lock IV.  Discharge home today.  PA to give home d/c instructions.   Rolan Bucco, MD [phone]

## 2014-01-27 NOTE — Discharge Summary (Signed)
  Date of admission: 01/26/2014  Date of discharge: 01/27/2014  Admission diagnosis: Prostate Cancer  Discharge diagnosis: Prostate Cancer  History and Physical: For full details, please see admission history and physical. Briefly, Austin [name] is a 58 y.o. gentleman with localized prostate cancer.  After discussing management/treatment options, he elected to proceed with surgical treatment.  Hospital Course: Austin [name] was taken to the operating room on 01/26/2014 and underwent a robotic assisted laparoscopic radical prostatectomy. He tolerated this procedure well and without complications. Postoperatively, he was able to be transferred to a regular hospital room following recovery from anesthesia.  He was able to begin ambulating the night of surgery. He remained hemodynamically stable overnight.  He had excellent urine output with appropriately minimal output from his pelvic drain and his pelvic drain was removed on POD #1.  He was transitioned to oral pain medication, tolerated a clear liquid diet, and had met all discharge criteria and was able to be discharged home later on POD#1.  Laboratory values:  Recent Labs  01/26/14 1250 01/27/14 0412  HGB 10.5* 9.3*  HCT 31.5* 28.8*    Disposition: Home  Discharge instruction: He was instructed to be ambulatory but to refrain from heavy lifting, strenuous activity, or driving. He was instructed on urethral catheter care.  Discharge medications:     Medication List    STOP taking these medications       multivitamin with minerals Tabs tablet      TAKE these medications       ciprofloxacin 500 MG tablet  Commonly known as:  CIPRO  Take 1 tablet (500 mg total) by mouth 2 (two) times daily. Start day prior to office visit for foley removal     HYDROcodone-acetaminophen 5-325 MG per tablet  Commonly known as:  NORCO  Take 1-2 tablets by mouth every 6 (six) hours as needed.     losartan-hydrochlorothiazide 100-25 MG per  tablet  Commonly known as:  HYZAAR  Take 1 tablet by mouth every morning.        Followup: He will followup in 1 week for catheter removal and to discuss his surgical pathology results.  I have seen and examined the patient and agree with the above assessment and plan.

## 2014-01-27 NOTE — Progress Notes (Signed)
Patient discharge home. Discharge instructions including leg bag/cath care reviewed with patient and wife. Patient verbalized understanding.

## 2014-01-27 NOTE — Op Note (Signed)
NAME:  EMPEROR, HIRTE NO.:  [id number]  MEDICAL RECORD NO.:  [id number]  LOCATION:  [id number]                         FACILITY:  [facility]  PHYSICIAN:  Alexis Frock, MD     DATE OF BIRTH:  1956-09-26  DATE OF PROCEDURE: 01/26/2014 DATE OF DISCHARGE:                              OPERATIVE REPORT   DIAGNOSIS:  Moderate risk prostate cancer.  PROCEDURES:  Robotic-assisted laparoscopic radical prostatectomy, bilateral pelvic lymphadenectomy, template plus sentinel ICG.  ESTIMATED BLOOD LOSS:  100 mL.  COMPLICATIONS:  None.  ASSISTANT: Felipa Furnace, PA  SPECIMENS: 1. Radical prostatectomy. 2. Periprostatic fat. 3. Right external iliac lymph nodes. 4. Right obturator lymph nodes. 5. Left external iliac lymph nodes. 6. Left obturator lymph nodes. 7. Right perivesical lymph node, sentinel. 8. Posterior bladder neck margin frozen section, benign glands     present. 9. Posterior bladder neck margin revised for permanent.  INDICATIONS:  Mr. Sunde is a pleasant 58 year old gentleman with history of elevated PSA.  He was found on workup of this to have moderate risk prostate cancer including bilateral disease as well as apical and base involvement.  Options were discussed in detail including surveillance versus various forms of radiation versus surgery with and without minimally invasive assistance and he wished to proceed with the latter.  Informed consent was obtained and placed in the medical record.  PROCEDURE IN DETAIL:  Patient being Anna Guldner, was verified. Procedure being robotic radical prostatectomy was confirmed.  Procedure was carried out.  Time-out was performed.  Intravenous antibiotics were administered.  General endotracheal anesthesia was introduced.  The patient was placed into a low lithotomy position.  Sterile field was created prepping and draping the patient's penis, perineum, and proximal thighs using iodine x3.  His infra-xiphoid  abdomen was prepped using chlorhexidine gluconate after further fashioned on the operating table using 3-inch tape across the chest over foam padding.  A test of steep Trendelenburg position was performed.  He was found to be suitably positioned.  Foley catheter was placed for easier straight drain.  Next, high-flow low pressure pneumoperitoneum was obtained using Veress technique in the infraumbilical midline having passed the aspiration and drop test.  Next, a 12-mm robotic camera port was placed in the same location.  Laparoscopic examination of the peritoneal cavity revealed no significant adhesions and no visceral injury.  Additional ports were placed as follows; right paramedian 8-mm robotic port, right far lateral 12-mm assistant port, right paramedian 5 mm suction port, left paramedian 8-mm robotic port, left far lateral 8-mm robotic port.  Robot was docked and passed through electronic checks.  Initial attention was directed at development of the space of Retzius first on the left side. Incision was made lateral to the left medial umbilical ligament from the area of the umbilicus towards the area of the internal ring and coursing across the iliac vessels.  The bladder was carefully swept away from the pelvic sidewall towards the area of the endopelvic fascia, and mirror image dissection was performed on the right side sweeping the right bladder way from the pelvic sidewall.  Loose anterior bladder test was then taken down using cautery scissors, it exposed the anterior base of  the prostate.  There was some periprostatic fat, which was further released from the bladder neck area and set aside for permanent pathology.  Next, a 0.2 mL of indocyanine green dye was then injected into the right and left lobes respectively.  The percutaneous spinal needle with aspiration in between each steps to avoid spillage, which did not occur.  Next, the endopelvic fascia was carefully swept  away from the lateral aspect of the prostate first on the right side, then on the left side in the base to apex orientation.  This exposed the area of the dorsal venous complex, which was controlled using vascular load stapler, and approximately 15 minutes had elapsed since dye injection. The pelvis was interrogated with infrared light, no significant sentinel nodes were found in the typical template areas.  As such, left template lymphadenectomy was then carefully performed.  First, the left external group was obtained by carefully mobilizing all fiber fatty tissue in the confines of the left external iliac artery, vein, pelvic side wall, and ureter.  Lymphostasis was achieved with cold clips.  This set aside, labeled left external iliac lymph nodes.  Next, all fiber fatty tissue in the confines of the left obturator nerve pelvic side wall and left external iliac vein were carefully mobilized, set aside, labeled left obturator lymph nodes.  Obturator nerve was inspected following these maneuvers and found to be uninjured.  Next, a mirror image lymphadenectomy was performed on the right side.  Again, the right external iliac lymph nodes, the confines being the right external iliac artery, vein, ureter and pelvic sidewall.  Lymphostasis was achieved with cold clips, and also the right obturator group was similarly obtained with lymphostasis clips.  The obturator nerve was inspected on the right side and found to be uninjured.  The entire pelvis was once again inspected under near infrared fluorescence and a single area was noted corresponding to the lymphatic channel at the right perivesical area.  There was one small area, which appeared to be a node along this channel.  This was quite small, was dissected free and set aside, labeled the right perivesical lymph node, sentinel.  Next, a bladder neck was identified by moving the Foley catheter back and forth, and incision was made in  anterior-posterior direction separating the bladder neck from the base of the prostate.  At the posterior bladder neck area, there appeared to be a questionable amount of small prostatic tissue still with the bladder side, this was sent for permanent pathology and found to be consistent with some benign glans.  Therefore, this area was revised until the circular muscle fibers of the bladder neck could be convincingly seen.  This set aside, labeled final posterior bladder neck margin.  Next, posterior dissection was performed by incising approximately 7 mm inferior and posterior to the posterior lip of the prostate and an obvious fascia was then seen and entered.  The bilateral vas deferens were dissected for distance approximately 4 cm, placed on gentle superior traction and ligated, and the bilateral seminal vesicles were dissected to the tips and also placed on gentle superior traction. Dissection was then proceeded still within the plane albeit in the base to apex orientation, thus exposed the pedicles bilaterally.  First on the left side, the pedicles were controlled using sequential clipping technique towards the area of the presumed neurovascular bundle.  The neurovascular tissue was very carefully swept laterally, performing aggressive nerve sparing on the left side.  In the right side, the pedicle was  similarly controlled using cold clips and a moderate aggressive nerve sparing was performed on the right side.  Next, apical dissection was performed by placing the prostate in gentle superior traction approaching from the anterior aspect.  The membranous urethra was coldly incised as of the posterior urethral plate, this completely freed up the prostatectomy specimen, which was placed in an EndoCatch bag for later retrieval.  Digital rectal exam was then performed using indicator glove and no evidence of rectal violation was seen.  Next, posterior urethral dissection was performed  using a single V-Loc suture reapproximating the posterior urethral plate to the posterior bladder neck bringing the structures into tension-free apposition.  Next, mucosal apposition was performed using double-armed V-Loc suture from the 6 o'clock to 12 o'clock position moving the catheter back and forth. Anterior reconstruction was performed by anchoring the anastomotic stitch to the area of the puboprostatic ligaments.  The Foley catheter was then easily placed and irrigated quantitatively and no gross leak was identified.  There was still a small area of the venous using near the area of the right pedicle, this was carefully inspected and no arterial pump pressure was seen.  As such, a Surgicel was placed in this location, which resulted in complete hemostasis.  All sponge and needle counts were correct.  Closed suction drain was brought through the previous left lateral most assistant port into the area of the pelvis. Robot was then undocked.  The previous 12-mm assistant port on the right side was closed using Vicryl and a suture passer under laparoscopic vision.  Specimen was retrieved by extending the previous camera port site for total distance approximately 3 cm, removing the prostatectomy specimen and setting aside for permanent pathology.  This extraction site was closed at the level of the fascia using figure-of-eight PDS x3 followed by the Scarpa's using Vicryl.  All incisions were infiltrated with dilute lipolyzed Marcaine and reapproximated at the level of skin using subcuticular Monocryl followed by Dermabond.  The procedure was then terminated.  The patient tolerated the procedure well.  There were no immediate periprocedural complications.  The patient was taken to the postanesthesia care unit in stable condition.          ______________________________ Alexis Frock, MD     TM/MEDQ  D:  01/26/2014  T:  01/27/2014  Job:  [id number]

## 2014-01-31 NOTE — Anesthesia Postprocedure Evaluation (Signed)
  Anesthesia Post-op Note  Patient: Austin [name]  Procedure(s) Performed: Procedure(s) (LRB): ROBOTIC ASSISTED LAPAROSCOPIC RADICAL PROSTATECTOMY (N/A) LYMPHADENECTOMY WITH INDOCYANINE GREEN DYE (Bilateral)  Patient Location: PACU  Anesthesia Type: General  Level of Consciousness: awake and alert   Airway and Oxygen Therapy: Patient Spontanous Breathing  Post-op Pain: mild  Post-op Assessment: Post-op Vital signs reviewed, Patient's Cardiovascular Status Stable, Respiratory Function Stable, Patent Airway and No signs of Nausea or vomiting  Last Vitals:  Filed Vitals:   01/27/14 1428  BP: 111/72  Pulse: 87  Temp: 36.6 C  Resp: 18    Post-op Vital Signs: stable   Complications: No apparent anesthesia complications

## 2017-04-05 ENCOUNTER — Other Ambulatory Visit: Payer: Self-pay

## 2017-04-05 ENCOUNTER — Encounter (HOSPITAL_COMMUNITY): Payer: Self-pay | Admitting: Emergency Medicine

## 2017-04-05 ENCOUNTER — Emergency Department (HOSPITAL_COMMUNITY)
Admission: EM | Admit: 2017-04-05 | Discharge: 2017-04-05 | Disposition: A | Discharge status: Home / Self Care | Payer: 59 | Attending: Emergency Medicine | Admitting: Emergency Medicine

## 2017-04-05 DIAGNOSIS — R799 Abnormal finding of blood chemistry, unspecified: Secondary | ICD-10-CM | POA: Diagnosis present

## 2017-04-05 DIAGNOSIS — E871 Hypo-osmolality and hyponatremia: Secondary | ICD-10-CM | POA: Insufficient documentation

## 2017-04-05 DIAGNOSIS — Z8546 Personal history of malignant neoplasm of prostate: Secondary | ICD-10-CM | POA: Diagnosis not present

## 2017-04-05 DIAGNOSIS — I10 Essential (primary) hypertension: Secondary | ICD-10-CM | POA: Diagnosis not present

## 2017-04-05 LAB — CBC WITH DIFFERENTIAL/PLATELET
BASOS ABS: 0 10*3/uL (ref 0.0–0.1)
Basophils Relative: 0 %
Eosinophils Absolute: 0 10*3/uL (ref 0.0–0.7)
Eosinophils Relative: 0 %
HEMATOCRIT: 30.2 % — AB (ref 39.0–52.0)
HEMOGLOBIN: 10.6 g/dL — AB (ref 13.0–17.0)
Lymphocytes Relative: 15 %
Lymphs Abs: 1.4 10*3/uL (ref 0.7–4.0)
MCH: 32.7 pg (ref 26.0–34.0)
MCHC: 35.1 g/dL (ref 30.0–36.0)
MCV: 93.2 fL (ref 78.0–100.0)
Monocytes Absolute: 1.8 10*3/uL — ABNORMAL HIGH (ref 0.1–1.0)
Monocytes Relative: 19 %
NEUTROS PCT: 66 %
Neutro Abs: 6.1 10*3/uL (ref 1.7–7.7)
Platelets: 351 10*3/uL (ref 150–400)
RBC: 3.24 MIL/uL — ABNORMAL LOW (ref 4.22–5.81)
RDW: 11.9 % (ref 11.5–15.5)
WBC: 9.3 10*3/uL (ref 4.0–10.5)

## 2017-04-05 LAB — BASIC METABOLIC PANEL
ANION GAP: 11 (ref 5–15)
BUN: 18 mg/dL (ref 6–20)
CHLORIDE: 90 mmol/L — AB (ref 101–111)
CO2: 23 mmol/L (ref 22–32)
Calcium: 9.3 mg/dL (ref 8.9–10.3)
Creatinine, Ser: 1.31 mg/dL — ABNORMAL HIGH (ref 0.61–1.24)
GFR calc Af Amer: >60 mL/min (ref >60)
GFR calc non Af Amer: 57 mL/min — ABNORMAL LOW (ref >60)
Glucose, Bld: 92 mg/dL (ref 65–99)
Potassium: 3.6 mmol/L (ref 3.5–5.1)
Sodium: 124 mmol/L — ABNORMAL LOW (ref 135–145)

## 2017-04-05 MED ORDER — SODIUM CHLORIDE 0.9 % IV BOLUS (SEPSIS)
2000.0000 mL | Freq: Once | INTRAVENOUS | Status: AC
  Administered 2017-04-05: 2000 mL via INTRAVENOUS

## 2017-04-05 MED ORDER — SODIUM CHLORIDE 0.9 % IV SOLN
INTRAVENOUS | Status: DC
  Administered 2017-04-05: 13:00:00 via INTRAVENOUS

## 2017-04-05 NOTE — ED Triage Notes (Signed)
Pt from home with wife sts that he was called by his PCP this am after being seen yesterday. Pt was told to come to ED because his Na+ low and his kidney function is elevated. Pt reports that he was able to urinate normally this am. Pt reports that had  Many days of nausea with emesis. Pt reports that he drank powerade yesterday and last night with no difficulty. Pt is A&O and in NAD

## 2017-04-05 NOTE — ED Provider Notes (Signed)
[facility] DEPT Provider Note   CSN: [id number] Arrival date & time: 04/05/17  1139     History   Chief Complaint Chief Complaint  Patient presents with  . Abnormal Lab    HPI Austin [name] is a 61 y.o. male.  61 year old male resents here with complaint of left eye revised showed a low sodium as well as elevated creatinine. Patient had been sick several days ago with vomiting which he attributed to any medication. Denied any fever or chills. No abdominal discomfort. Went to his doctor's office yesterday and was found to be hyponatremic he was the value. States he feels that his baseline at this time. He can take oral intake properly. Was sent to the ED for further management      Past Medical History:  Diagnosis Date  . Arthritis    hands  . GERD (gastroesophageal reflux disease)   . Hypertension   . Prostate cancer [facility])     Patient Active Problem List   Diagnosis Date Noted  . Prostate cancer ([place]) 01/26/2014    Past Surgical History:  Procedure Laterality Date  . LYMPHADENECTOMY Bilateral 01/26/2014   Procedure: LYMPHADENECTOMY WITH INDOCYANINE GREEN DYE;  Surgeon: Alexis Frock, MD;  Location: [facility] ORS;  Service: Urology;  Laterality: Bilateral;  . ROBOT ASSISTED LAPAROSCOPIC RADICAL PROSTATECTOMY N/A 01/26/2014   Procedure: ROBOTIC ASSISTED LAPAROSCOPIC RADICAL PROSTATECTOMY;  Surgeon: Alexis Frock, MD;  Location: [facility] ORS;  Service: Urology;  Laterality: N/A;  . SHOULDER SURGERY Left        Home Medications    Prior to Admission medications   Medication Sig Start Date End Date Taking? Authorizing Provider  ciprofloxacin (CIPRO) 500 MG tablet Take 1 tablet (500 mg total) by mouth 2 (two) times daily. Start day prior to office visit for foley removal 01/26/14   Debbrah Alar, PA-C  HYDROcodone-acetaminophen (NORCO) 5-325 MG per tablet Take 1-2 tablets by mouth every 6 (six) hours as needed. 01/26/14   Debbrah Alar, PA-C  losartan-hydrochlorothiazide  (HYZAAR) 100-25 MG per tablet Take 1 tablet by mouth every morning.    Historical Provider, MD    Family History No family history on file.  Social History Social History  Substance Use Topics  . Smoking status: Never Smoker  . Smokeless tobacco: Never Used  . Alcohol use Yes     Comment: 3 beers daily     Allergies   Patient has no known allergies.   Review of Systems Review of Systems  All other systems reviewed and are negative.    Physical Exam Updated Vital Signs BP 110/75   Pulse 84   Temp 97.7 F (36.5 C) (Oral)   Resp 15   Ht 5' 9.5" (1.765 m)   Wt 72.6 kg   SpO2 98%   BMI 23.29 kg/m   Physical Exam  Constitutional: He is oriented to person, place, and time. He appears well-developed and well-nourished.  Non-toxic appearance. No distress.  HENT:  Head: Normocephalic and atraumatic.  Eyes: Conjunctivae, EOM and lids are normal. Pupils are equal, round, and reactive to light.  Neck: Normal range of motion. Neck supple. No tracheal deviation present. No thyroid mass present.  Cardiovascular: Normal rate, regular rhythm and normal heart sounds.  Exam reveals no gallop.   No murmur heard. Pulmonary/Chest: Effort normal and breath sounds normal. No stridor. No respiratory distress. He has no decreased breath sounds. He has no wheezes. He has no rhonchi. He has no rales.  Abdominal: Soft. Normal appearance and bowel  sounds are normal. He exhibits no distension. There is no tenderness. There is no rebound and no CVA tenderness.  Musculoskeletal: Normal range of motion. He exhibits no edema or tenderness.  Neurological: He is alert and oriented to person, place, and time. He has normal strength. No cranial nerve deficit or sensory deficit. GCS eye subscore is 4. GCS verbal subscore is 5. GCS motor subscore is 6.  Skin: Skin is warm and dry. No abrasion and no rash noted.  Psychiatric: He has a normal mood and affect. His speech is normal and behavior is normal.    Nursing note and vitals reviewed.    ED Treatments / Results  Labs (all labs ordered are listed, but only abnormal results are displayed) Labs Reviewed  CBC WITH DIFFERENTIAL/PLATELET - Abnormal; Notable for the following:       Result Value   RBC 3.24 (*)    Hemoglobin 10.6 (*)    HCT 30.2 (*)    Monocytes Absolute 1.8 (*)    All other components within normal limits  BASIC METABOLIC PANEL - Abnormal; Notable for the following:    Sodium 124 (*)    Chloride 90 (*)    Creatinine, Ser 1.31 (*)    GFR calc non Af Amer 57 (*)    All other components within normal limits    EKG  EKG Interpretation None       Radiology No results found.  Procedures Procedures (including critical care time)  Medications Ordered in ED Medications  0.9 %  sodium chloride infusion ( Intravenous New Bag/Given 04/05/17 1250)  sodium chloride 0.9 % bolus 2,000 mL (not administered)     Initial Impression / Assessment and Plan / ED Course  I have reviewed the triage vital signs and the nursing notes.  Pertinent labs & imaging results that were available during my care of the patient were reviewed by me and considered in my medical decision making (see chart for details).     Patient given IV fluids here for his likely dehydration. Sodium value noted. Patient feels fine and will follow-up with his doctor next week for repeat lab studies.  Final Clinical Impressions(s) / ED Diagnoses   Final diagnoses:  None    New Prescriptions New Prescriptions   No medications on file     Lacretia Leigh, MD 04/05/17 1515

## 2017-04-05 NOTE — Discharge Instructions (Signed)
Your sodium today was 124 and your creatinine was 1.31. He was given fluids in the ER and need to follow-up with your Dr. next week for repeat basic metabolic panel

## 2018-09-25 ENCOUNTER — Ambulatory Visit: Payer: 59 | Admitting: Nurse Practitioner

## 2018-09-25 ENCOUNTER — Encounter: Payer: Self-pay | Admitting: Nurse Practitioner

## 2018-09-25 VITALS — BP 120/90 | HR 109 | Temp 97.9°F | Ht 69.5 in | Wt 147.8 lb

## 2018-09-25 DIAGNOSIS — F1010 Alcohol abuse, uncomplicated: Secondary | ICD-10-CM | POA: Diagnosis not present

## 2018-09-25 DIAGNOSIS — I10 Essential (primary) hypertension: Secondary | ICD-10-CM

## 2018-09-25 DIAGNOSIS — K921 Melena: Secondary | ICD-10-CM

## 2018-09-25 DIAGNOSIS — Z9119 Patient's noncompliance with other medical treatment and regimen: Secondary | ICD-10-CM

## 2018-09-25 NOTE — Progress Notes (Addendum)
  Subjective:     Patient ID: Austin [name] , male    DOB: 09-03-1956 , 61 y.o.   MRN: [id number]   Reports blood in his stool for the last 2 months, describes as being red.  Denies constipation.  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  Drinks 1-2 beer per night.   Hypertension  This is a chronic problem. The current episode started more than 1 year ago. The problem is uncontrolled. Pertinent negatives include no anxiety or headaches. Risk factors for coronary artery disease include male gender.     Past Medical History:  Diagnosis Date  . Arthritis    hands  . GERD (gastroesophageal reflux disease)   . Hypertension   . Prostate cancer ([place])       Current Outpatient Medications:  .  losartan-hydrochlorothiazide (HYZAAR) 100-25 MG per tablet, Take 1 tablet by mouth every morning., Disp: , Rfl:    No Known Allergies   Review of Systems  Constitutional: Negative.   Respiratory: Negative.   Cardiovascular: Negative.   Gastrointestinal: Positive for blood in stool. Negative for abdominal distention, abdominal pain, anal bleeding, constipation, diarrhea, nausea, rectal pain and vomiting.  Skin: Negative.   Neurological: Negative for headaches.     Today's Vitals   09/25/18 1441  BP: 120/90  Pulse: (!) 109  Temp: 97.9 F (36.6 C)  TempSrc: Oral  SpO2: 94%  Weight: 147 lb 12.8 oz (67 kg)  Height: 5' 9.5" (1.765 m)   Body mass index is 21.51 kg/m.   Objective:  Physical Exam  Constitutional: He is oriented to person, place, and time. He appears well-developed and well-nourished.  Cardiovascular: Normal heart sounds and intact distal pulses. Tachycardia present.  Pulmonary/Chest: Effort normal and breath sounds normal.  Genitourinary: Rectal exam shows no tenderness and guaiac negative stool.  Neurological: He is alert and oriented to person, place, and time.  Smells of alcohol.    Skin: Skin is warm and dry.         Assessment And Plan:     1. Blood in stool  Negative guiac, however with his alcohol history I will refer him to GI for further evaluation   - Ambulatory referral to Gastroenterology - CBC with Diff  2. ETOH abuse  Reports drinking 1-2 beer per day,   Smells of alcohol today  Will check alcohol level today  Encouraged to quit drinking - Ambulatory referral to Gastroenterology - CBC with Diff - Alcohol  3. Essential hypertension  Chronic,   Has not been seen in 1 1/2 years  Will restart his blood pressure medicine - BMP8+eGFR       [name], FNP

## 2018-09-30 LAB — CBC WITH DIFFERENTIAL/PLATELET
BASOS ABS: 0 10*3/uL (ref 0.0–0.2)
Basos: 0 %
EOS (ABSOLUTE): 0 10*3/uL (ref 0.0–0.4)
Eos: 0 %
Hematocrit: 30.8 % — ABNORMAL LOW (ref 37.5–51.0)
Hemoglobin: 10.4 g/dL — ABNORMAL LOW (ref 13.0–17.7)
IMMATURE GRANS (ABS): 0 10*3/uL (ref 0.0–0.1)
IMMATURE GRANULOCYTES: 0 %
LYMPHS: 31 %
Lymphocytes Absolute: 2.7 10*3/uL (ref 0.7–3.1)
MCH: 31.8 pg (ref 26.6–33.0)
MCHC: 33.8 g/dL (ref 31.5–35.7)
MCV: 94 fL (ref 79–97)
Monocytes Absolute: 1.1 10*3/uL — ABNORMAL HIGH (ref 0.1–0.9)
Monocytes: 13 %
NEUTROS PCT: 56 %
Neutrophils Absolute: 4.8 10*3/uL (ref 1.4–7.0)
PLATELETS: 237 10*3/uL (ref 150–450)
RBC: 3.27 x10E6/uL — ABNORMAL LOW (ref 4.14–5.80)
RDW: 12.8 % (ref 12.3–15.4)
WBC: 8.6 10*3/uL (ref 3.4–10.8)

## 2018-09-30 LAB — BMP8+EGFR
BUN/Creatinine Ratio: 9 — ABNORMAL LOW (ref 10–24)
BUN: 9 mg/dL (ref 8–27)
CALCIUM: 9 mg/dL (ref 8.6–10.2)
CHLORIDE: 99 mmol/L (ref 96–106)
CO2: 22 mmol/L (ref 20–29)
Creatinine, Ser: 0.97 mg/dL (ref 0.76–1.27)
GFR calc Af Amer: 96 mL/min/{1.73_m2} (ref >59)
GFR calc non Af Amer: 83 mL/min/{1.73_m2} (ref >59)
Glucose: 69 mg/dL (ref 65–99)
Potassium: 4.2 mmol/L (ref 3.5–5.2)
Sodium: 137 mmol/L (ref 134–144)

## 2018-09-30 LAB — ETHANOL: ETHANOL LVL: 0.137 %

## 2018-11-10 DIAGNOSIS — K921 Melena: Secondary | ICD-10-CM | POA: Insufficient documentation

## 2018-11-10 DIAGNOSIS — I10 Essential (primary) hypertension: Secondary | ICD-10-CM | POA: Insufficient documentation

## 2018-11-10 DIAGNOSIS — F1010 Alcohol abuse, uncomplicated: Secondary | ICD-10-CM | POA: Insufficient documentation

## 2018-11-26 ENCOUNTER — Other Ambulatory Visit: Payer: Self-pay

## 2018-11-26 MED ORDER — LOSARTAN POTASSIUM-HCTZ 100-25 MG PO TABS: 1.0000 | ORAL_TABLET | Freq: Every day | ORAL | 1 refills | Status: DC

## 2019-04-09 ENCOUNTER — Other Ambulatory Visit: Payer: Self-pay | Admitting: Nurse Practitioner

## 2019-07-13 ENCOUNTER — Other Ambulatory Visit: Payer: Self-pay

## 2019-07-13 DIAGNOSIS — Z20822 Contact with and (suspected) exposure to covid-19: Secondary | ICD-10-CM

## 2019-07-14 LAB — NOVEL CORONAVIRUS, NAA: SARS-CoV-2, NAA: NOT DETECTED

## 2019-07-15 ENCOUNTER — Telehealth: Payer: Self-pay

## 2019-07-15 NOTE — Telephone Encounter (Signed)
Patient's wife called stating pt was exposed to covid at work and he needs  To be tested.  I HAVE RETURNED HER CALL AND SHE STATED HE WENT AND GOT TESTED AT [facility] I ADVISED HER THAT HE SHOULD BE QUARANTINED UNTIL HIS RESULTS ARE BACK. PT CONSENTED TO TELEPHONE VISIT. Lonia Mad

## 2019-07-19 ENCOUNTER — Ambulatory Visit (INDEPENDENT_AMBULATORY_CARE_PROVIDER_SITE_OTHER): Payer: BC Managed Care – PPO | Admitting: Nurse Practitioner

## 2019-07-19 ENCOUNTER — Other Ambulatory Visit: Payer: Self-pay

## 2019-07-19 ENCOUNTER — Encounter: Payer: Self-pay | Admitting: Nurse Practitioner

## 2019-07-19 VITALS — BP 130/88 | Wt 147.0 lb

## 2019-07-19 DIAGNOSIS — Z0289 Encounter for other administrative examinations: Secondary | ICD-10-CM | POA: Diagnosis not present

## 2019-07-19 DIAGNOSIS — Z20822 Contact with and (suspected) exposure to covid-19: Secondary | ICD-10-CM

## 2019-07-19 DIAGNOSIS — Z20828 Contact with and (suspected) exposure to other viral communicable diseases: Secondary | ICD-10-CM | POA: Diagnosis not present

## 2019-07-19 NOTE — Progress Notes (Deleted)
  Subjective:     Patient ID: Austin [name] , male    DOB: Dec 02, 1956 , 63 y.o.   MRN: [id number]   Chief Complaint  Patient presents with  . covid    patient stated he does not have any symptoms he was only tested because he was exposed to someone positive     HPI  He went to have a COVID test due to exposure - coworker was positive.  He worked with him 7 days ago.  He has been out of work for 7 days.  He is now back at work.  CMT [phone] Attn: [place].      Past Medical History:  Diagnosis Date  . Arthritis    hands  . GERD (gastroesophageal reflux disease)   . Hypertension   . Prostate cancer ([place])      No family history on file.   Current Outpatient Medications:  .  losartan-hydrochlorothiazide (HYZAAR) 100-25 MG tablet, Take 1 tablet by mouth daily., Disp: 90 tablet, Rfl: 1 .  Multiple Vitamin (MULTIVITAMIN) tablet, Take 1 tablet by mouth daily., Disp: , Rfl:    No Known Allergies   Review of Systems  Constitutional: Negative.   Respiratory: Negative.   Cardiovascular: Negative.   Neurological: Negative.   Psychiatric/Behavioral: Negative.      Today's Vitals   07/19/19 1452  BP: 130/88  Weight: 147 lb (66.7 kg)  PainSc: 0-No pain   Body mass index is 21.4 kg/m.   Objective:  Physical Exam Constitutional:      Appearance: Normal appearance.  Cardiovascular:     Rate and Rhythm: Normal rate and regular rhythm.     Pulses: Normal pulses.     Heart sounds: Normal heart sounds. No murmur.  Pulmonary:     Effort: Pulmonary effort is normal. No respiratory distress.     Breath sounds: Normal breath sounds.  Skin:    Capillary Refill: Capillary refill takes less than 2 seconds.  Neurological:     General: No focal deficit present.     Mental Status: He is alert and oriented to person, place, and time.         Assessment And Plan:     1. Exposure to Covid-19 Virus  He was exposed to a coworker who was positive for coronavirus, he has been  out of work for the last 7 days  His coronavirus test was negative  He does not have any symptoms  Will provide a letter to return to work.    [name], FNP    THE PATIENT IS ENCOURAGED TO PRACTICE SOCIAL DISTANCING DUE TO THE COVID-19 PANDEMIC.

## 2019-08-01 ENCOUNTER — Encounter: Payer: Self-pay | Admitting: Nurse Practitioner

## 2019-08-04 NOTE — Progress Notes (Signed)
Virtual Visit via Telephone   This visit type was conducted due to national recommendations for restrictions regarding the COVID-19 Pandemic (e.g. social distancing) in an effort to limit this patient's exposure and mitigate transmission in our community.  Due to his co-morbid illnesses, this patient is at least at moderate risk for complications without adequate follow up.  This format is felt to be most appropriate for this patient at this time.  All issues noted in this document were discussed and addressed.  A limited physical exam was performed with this format.    This visit type was conducted due to national recommendations for restrictions regarding the COVID-19 Pandemic (e.g. social distancing) in an effort to limit this patient's exposure and mitigate transmission in our community.  Patients identity confirmed using two different identifiers.  This format is felt to be most appropriate for this patient at this time.  All issues noted in this document were discussed and addressed.  No physical exam was performed (except for noted visual exam findings with Video Visits).    Date:  08/04/2019   ID:  Austin [name], DOB January 18, 1956, MRN [id number]  Patient Location:  Home - spoke with Normajean Glasgow  Provider location:   Office    Chief Complaint:  Requested to be tested for covid due to possible exposure  History of Present Illness:    Austin [name] is a 63 y.o. male who presents via video conferencing for a telehealth visit today.    The patient does not have symptoms concerning for COVID-19 infection (fever, chills, cough, or new shortness of breath).   He went to have a COVID test due to exposure - coworker was positive.  He worked with him 7 days ago.  He has been out of work for 7 days.  He is now back at work.  CMT [phone] Attn: [place].     Past Medical History:  Diagnosis Date  . Arthritis    hands  . GERD (gastroesophageal reflux disease)   . Hypertension    . Prostate cancer [facility])    Past Surgical History:  Procedure Laterality Date  . LYMPHADENECTOMY Bilateral 01/26/2014   Procedure: LYMPHADENECTOMY WITH INDOCYANINE GREEN DYE;  Surgeon: Alexis Frock, MD;  Location: [facility];  Service: Urology;  Laterality: Bilateral;  . ROBOT ASSISTED LAPAROSCOPIC RADICAL PROSTATECTOMY N/A 01/26/2014   Procedure: ROBOTIC ASSISTED LAPAROSCOPIC RADICAL PROSTATECTOMY;  Surgeon: Alexis Frock, MD;  Location: [facility];  Service: Urology;  Laterality: N/A;  . SHOULDER SURGERY Left      Current Meds  Medication Sig  . losartan-hydrochlorothiazide (HYZAAR) 100-25 MG tablet Take 1 tablet by mouth daily.  . Multiple Vitamin (MULTIVITAMIN) tablet Take 1 tablet by mouth daily.     Allergies:   Patient has no known allergies.   Social History   Tobacco Use  . Smoking status: Never Smoker  . Smokeless tobacco: Never Used  Substance Use Topics  . Alcohol use: Yes    Comment: 3 beers daily  . Drug use: No     Family Hx: The patient's family history is not on file.  ROS:   Please see the history of present illness.    Review of Systems  Constitutional: Negative.   HENT: Negative.   Respiratory: Negative.   Cardiovascular: Negative.   Neurological: Negative for dizziness and tingling.  Psychiatric/Behavioral: Negative.     All other systems reviewed and are negative.   Labs/Other Tests and Data Reviewed:    Recent Labs: 09/25/2018: BUN  9; Creatinine, Ser 0.97; Hemoglobin 10.4; Platelets 237; Potassium 4.2; Sodium 137   Recent Lipid Panel No results found for: CHOL, TRIG, HDL, CHOLHDL, LDLCALC, LDLDIRECT  Wt Readings from Last 3 Encounters:  07/19/19 147 lb (66.7 kg)  09/25/18 147 lb 12.8 oz (67 kg)  04/05/17 160 lb (72.6 kg)     Exam:    Vital Signs:  BP 130/88 (BP Location: Left Arm, Patient Position: Sitting, Cuff Size: Small)   Wt 147 lb (66.7 kg)   BMI 21.40 kg/m     Physical Exam  Constitutional: He is oriented to person, place,  and time.  Neurological: He is alert and oriented to person, place, and time.  Psychiatric: Mood, memory, affect and judgment normal.  Unable to visualize due to telephone visit  ASSESSMENT & PLAN:     1. Exposure to Covid-19 Virus  He was exposed to a coworker who was positive for coronavirus, he has been out of work for the last 7 days  His coronavirus test was negative  He does not have any symptoms  Will provide a letter to return to work.    COVID-19 Education: The signs and symptoms of COVID-19 were discussed with the patient and how to seek care for testing (follow up with PCP or arrange E-visit).  The importance of social distancing was discussed today.  Patient Risk:   After full review of this patients clinical status, I feel that they are at least moderate risk at this time.  Time:   Today, I have spent 10 minutes/ seconds with the patient with telehealth technology discussing above diagnoses.     Medication Adjustments/Labs and Tests Ordered: Current medicines are reviewed at length with the patient today.  Concerns regarding medicines are outlined above.   Tests Ordered: No orders of the defined types were placed in this encounter.   Medication Changes: No orders of the defined types were placed in this encounter.   Disposition:  Follow up prn  Signed, [name], FNP

## 2019-10-15 ENCOUNTER — Other Ambulatory Visit: Payer: Self-pay | Admitting: Nurse Practitioner

## 2020-01-19 ENCOUNTER — Other Ambulatory Visit: Payer: Self-pay | Admitting: Gastroenterology

## 2020-01-19 DIAGNOSIS — R7989 Other specified abnormal findings of blood chemistry: Secondary | ICD-10-CM

## 2020-01-25 ENCOUNTER — Ambulatory Visit
Admission: RE | Admit: 2020-01-25 | Discharge: 2020-01-25 | Disposition: A | Discharge status: Home / Self Care | Payer: 59 | Source: Ambulatory Visit | Attending: Gastroenterology | Admitting: Gastroenterology

## 2020-01-25 DIAGNOSIS — R7989 Other specified abnormal findings of blood chemistry: Secondary | ICD-10-CM

## 2020-01-26 ENCOUNTER — Telehealth: Payer: Self-pay

## 2020-01-26 NOTE — Telephone Encounter (Signed)
PT SPOUSE CALLED TO [facility] APPT FOR SPOUSE ATT TO CONTACT PT BACK [phone] NO ANS PHONE HANGS UP UNABLE TO LVM

## 2020-01-27 ENCOUNTER — Other Ambulatory Visit: Payer: Self-pay | Admitting: Nurse Practitioner

## 2020-01-28 ENCOUNTER — Ambulatory Visit (INDEPENDENT_AMBULATORY_CARE_PROVIDER_SITE_OTHER): Payer: 59

## 2020-01-28 ENCOUNTER — Other Ambulatory Visit: Payer: Self-pay

## 2020-01-28 ENCOUNTER — Encounter: Payer: Self-pay | Admitting: Emergency Medicine

## 2020-01-28 ENCOUNTER — Ambulatory Visit
Admission: EM | Admit: 2020-01-28 | Discharge: 2020-01-28 | Disposition: A | Discharge status: Home / Self Care | Payer: 59 | Attending: Physician Assistant | Admitting: Physician Assistant

## 2020-01-28 DIAGNOSIS — I10 Essential (primary) hypertension: Secondary | ICD-10-CM | POA: Diagnosis not present

## 2020-01-28 DIAGNOSIS — I509 Heart failure, unspecified: Secondary | ICD-10-CM

## 2020-01-28 DIAGNOSIS — R0602 Shortness of breath: Secondary | ICD-10-CM

## 2020-01-28 LAB — CBC WITH DIFFERENTIAL/PLATELET
Basophils Absolute: 0 10*3/uL (ref 0.0–0.2)
Basos: 1 %
EOS (ABSOLUTE): 0 10*3/uL (ref 0.0–0.4)
Eos: 0 %
Hematocrit: 30.7 % — ABNORMAL LOW (ref 37.5–51.0)
Hemoglobin: 9.9 g/dL — ABNORMAL LOW (ref 13.0–17.7)
Immature Grans (Abs): 0 10*3/uL (ref 0.0–0.1)
Immature Granulocytes: 0 %
Lymphocytes Absolute: 1 10*3/uL (ref 0.7–3.1)
Lymphs: 18 %
MCH: 30.1 pg (ref 26.6–33.0)
MCHC: 32.2 g/dL (ref 31.5–35.7)
MCV: 93 fL (ref 79–97)
Monocytes Absolute: 1 10*3/uL — ABNORMAL HIGH (ref 0.1–0.9)
Monocytes: 17 %
NRBC: 1 % — ABNORMAL HIGH (ref 0–0)
Neutrophils Absolute: 3.7 10*3/uL (ref 1.4–7.0)
Neutrophils: 64 %
Platelets: 203 10*3/uL (ref 150–450)
RBC: 3.29 x10E6/uL — ABNORMAL LOW (ref 4.14–5.80)
RDW: 19.3 % — ABNORMAL HIGH (ref 11.6–15.4)
WBC: 5.7 10*3/uL (ref 3.4–10.8)

## 2020-01-28 LAB — BASIC METABOLIC PANEL
BUN/Creatinine Ratio: 11 (ref 10–24)
BUN: 11 mg/dL (ref 8–27)
CO2: 15 mmol/L — ABNORMAL LOW (ref 20–29)
Calcium: 8.3 mg/dL — ABNORMAL LOW (ref 8.6–10.2)
Chloride: 94 mmol/L — ABNORMAL LOW (ref 96–106)
Creatinine, Ser: 1 mg/dL (ref 0.76–1.27)
GFR calc Af Amer: 92 mL/min/{1.73_m2} (ref >59)
GFR calc non Af Amer: 79 mL/min/{1.73_m2} (ref >59)
Glucose: 107 mg/dL — ABNORMAL HIGH (ref 65–99)
Potassium: 4.7 mmol/L (ref 3.5–5.2)
Sodium: 126 mmol/L — ABNORMAL LOW (ref 134–144)

## 2020-01-28 LAB — BRAIN NATRIURETIC PEPTIDE: BNP: 1421 pg/mL — ABNORMAL HIGH (ref 0.0–100.0)

## 2020-01-28 MED ORDER — FUROSEMIDE 40 MG PO TABS: 40.0000 mg | ORAL_TABLET | Freq: Every day | ORAL | 0 refills | Status: DC

## 2020-01-28 NOTE — Discharge Instructions (Addendum)
As discussed, your chest xray showed that you have fluid in your lungs. Given this result with leg swelling, we suspect you to have heart failure. I have attached some information about this. At this time, start lasix to help get fluid off your lungs and legs. Follow up with PCP next week for recheck. If any worsening symptoms, worsening shortness of breath, chest pain, weakness, dizziness, go to the emergency department for further evaluation needed.

## 2020-01-28 NOTE — ED Triage Notes (Signed)
Pt presents to [facility] for assessment of 3 days of cough, emesis, diarrhea (relieved by pepto-bismol).  Pt states he had a COVID test for work 2 days ago that was negative.  States starting this morning he has 1 episode of emesis and also developing SOB, where he breathes more heavily during exertion.

## 2020-01-28 NOTE — ED Provider Notes (Addendum)
[facility]    CSN: [id number] Arrival date & time: 01/28/20  0920      History   Chief Complaint Chief Complaint  Patient presents with  . Cough    HPI Austin [name] is a 64 y.o. male.   65 year old male with history of prostate cancer, GERD, HTN comes in for 1 week history of nausea, vomiting, diarrhea, cough. States nausea, vomiting, diarrhea has improved significantly, but cough has gotten worse. Denies fever, chills, body aches. State had low abdominal pain with vomiting/diarrhea. Can take some fluid intake if taking small sips. Denies loss of taste/smell. Started today with dyspnea on exertion. Denies chest pain. Has noticed leg swelling for the past 4 weeks the improves at night. Denies orthopnea. Never smoker.      Past Medical History:  Diagnosis Date  . Arthritis    hands  . GERD (gastroesophageal reflux disease)   . Hypertension   . Prostate cancer [facility])     Patient Active Problem List   Diagnosis Date Noted  . ETOH abuse 11/10/2018  . Essential hypertension 11/10/2018  . Blood in stool 11/10/2018  . Prostate cancer ([place]) 01/26/2014    Past Surgical History:  Procedure Laterality Date  . LYMPHADENECTOMY Bilateral 01/26/2014   Procedure: LYMPHADENECTOMY WITH INDOCYANINE GREEN DYE;  Surgeon: Alexis Frock, MD;  Location[facility];  Service: Urology;  Laterality: Bilateral;  . ROBOT ASSISTED LAPAROSCOPIC RADICAL PROSTATECTOMY N/A 01/26/2014   Procedure: ROBOTIC ASSISTED LAPAROSCOPIC RADICAL PROSTATECTOMY;  Surgeon: Alexis Frock, MD;  Location: [facility];  Service: Urology;  Laterality: N/A;  . SHOULDER SURGERY Left        Home Medications    Prior to Admission medications   Medication Sig Start Date End Date Taking? Authorizing Provider  furosemide (LASIX) 40 MG tablet Take 1 tablet (40 mg total) by mouth daily. 01/28/20   Ok Edwards, PA-C  losartan-hydrochlorothiazide (HYZAAR) 100-25 MG tablet TAKE 1 TABLET BY MOUTH EVERY DAY 01/27/20    [name], FNP  Multiple Vitamin (MULTIVITAMIN) tablet Take 1 tablet by mouth daily.    [provider]    Family History Family History  Problem Relation Age of Onset  . Hypertension Mother   . Hypertension Father     Social History Social History   Tobacco Use  . Smoking status: Never Smoker  . Smokeless tobacco: Never Used  Substance Use Topics  . Alcohol use: Yes    Comment: 3 beers daily  . Drug use: No     Allergies   Patient has no known allergies.   Review of Systems Review of Systems  Reason unable to perform ROS: See HPI as above.     Physical Exam Triage Vital Signs ED Triage Vitals [01/28/20 0936]  Enc Vitals Group     BP (!) 125/93     Pulse Rate (!) 104     Resp 20     Temp 97.9 F (36.6 C)     Temp Source Temporal     SpO2 95 %     Weight      Height      Head Circumference      Peak Flow      Pain Score 0     Pain Loc      Pain Edu?      Excl. in [place]?    No data found.  Updated Vital Signs BP (!) 125/93 (BP Location: Left Arm)   Pulse (!) 104  Temp 97.9 F (36.6 C) (Temporal)   Resp 20   SpO2 95%   Physical Exam Constitutional:      General: He is not in acute distress.    Appearance: Normal appearance. He is not ill-appearing, toxic-appearing or diaphoretic.  HENT:     Head: Normocephalic and atraumatic.     Mouth/Throat:     Mouth: Mucous membranes are moist.     Pharynx: Oropharynx is clear. Uvula midline.  Cardiovascular:     Rate and Rhythm: Regular rhythm. Tachycardia present.     Heart sounds: Normal heart sounds. No murmur. No friction rub. No gallop.   Pulmonary:     Effort: Pulmonary effort is normal. No accessory muscle usage, prolonged expiration, respiratory distress or retractions.     Comments: Lungs clear to auscultation without adventitious lung sounds. Abdominal:     General: Bowel sounds are normal.     Palpations: Abdomen is soft.     Tenderness: There is no right CVA tenderness, left  CVA tenderness or guarding.  Musculoskeletal:     Cervical back: Normal range of motion and neck supple.     Comments: 1+ pitting edema bilaterally to below the knee. No erythema, warmth. No tenderness to palpation  Neurological:     General: No focal deficit present.     Mental Status: He is alert and oriented to person, place, and time.     UC Treatments / Results  Labs (all labs ordered are listed, but only abnormal results are displayed) Labs Reviewed  CBC WITH DIFFERENTIAL/PLATELET - Abnormal; Notable for the following components:      Result Value   RBC 3.29 (*)    Hemoglobin 9.9 (*)    Hematocrit 30.7 (*)    RDW 19.3 (*)    Monocytes Absolute 1.0 (*)    NRBC 1 (*)    All other components within normal limits   Narrative:    Performed at:  [facility] [address] [place], [place]  [id number] Lab Director: Rush Farmer MD, Phone:  [phone]  BASIC METABOLIC PANEL - Abnormal; Notable for the following components:   Glucose 107 (*)    Sodium 126 (*)    Chloride 94 (*)    CO2 15 (*)    Calcium 8.3 (*)    All other components within normal limits   Narrative:    Performed at:  [facility] [address] [place], [place]  [id number] Lab Director: Rush Farmer MD, Phone:  [phone]  [place] - Abnormal; Notable for the following components:   BNP 1,421.0 (*)    All other components within normal limits   Narrative:    Performed at:  [address] [place], [place]  [id number] Lab Director: Rush Farmer MD, Phone:  [phone]    EKG   Radiology DG Chest 2 View  Result Date: 01/28/2020 CLINICAL DATA:  Shortness of breath.  Leg swelling. EXAM: CHEST - 2 VIEW COMPARISON:  January 24, 2014 FINDINGS: No pneumothorax. The cardiac silhouette appears enlarged, new since 2015. There is a small right effusion with underlying atelectasis. There is a small left effusion with associated opacity in the  left base. The hila and mediastinum are normal. No pneumothorax. IMPRESSION: 1. The cardiac silhouette is enlarged, new since 2015. This could represent cardiomegaly or pericardial effusion. 2. There is a small left effusion with associated opacity. The associated opacity could represent pneumonia, aspiration, or atelectasis. Recommend clinical correlation. 3. Small  right effusion with atelectasis. Electronically Signed   By: Dorise Bullion III M.D   On: 01/28/2020 10:13    Procedures Procedures (including critical care time)  Medications Ordered in UC Medications - No data to display  Initial Impression / Assessment and Plan / UC Course  I have reviewed the triage vital signs and the nursing notes.  Pertinent labs & imaging results that were available during my care of the patient were reviewed by me and considered in my medical decision making (see chart for details).    Discussed case with Dr Lanice Shirts. 64 year old male with history of HTN comes in for 1 week history of nausea, vomiting, diarrhea, cough. N/V/D has resolved, but with worsening cough, now with dyspnea on exertion. Denies fever, chills, body aches. Leg swelling that patient feels has been for 4 weeks. No orthopnea, chest pain. COVID test 2 days ago negative.   EKG sinus tachycardia, 103bpm, rightward axis, V5-V6 T wave inversion, otherwise, no significant changes from prior EKG. CXR with bilateral small effusion, with current clinical picture, consistent with new onset CHF. He is without chest pain, low suspicion of ACS causing symptoms. Will obtain CBC, BMP, BNP for further evaluation. Return precautions given. Otherwise patient to follow up with PCP next week for further evaluation needed and management needed. Patient discharged in stable condition pending lab results. Patient and wife expresses understanding and agrees to plan.  Case discussed with Dr Lanny Cramp and agrees to plan.   Discussed lab results with Dr Lanny Cramp, BNP  elevated, confirming CHF. Otherwise, no alarming signs at this time. Jerry Caras, RN contacted patient to inform of lab results, where patient verbalized improvement of symptoms. Will continue to monitor for now and follow plan placed prior with PCP follow up within the week. Return precautions were provided again per RN. Please see RN note.   Final Clinical Impressions(s) / UC Diagnoses   Final diagnoses:  New onset of congestive heart failure [facility])   ED Prescriptions    Medication Sig Dispense Auth. Provider   furosemide (LASIX) 40 MG tablet Take 1 tablet (40 mg total) by mouth daily. 5 tablet Ok Edwards, PA-C     PDMP not reviewed this encounter.   Ok Edwards, PA-C 01/28/20 [place], Babita Amaker V, PA-C 01/29/20 1316

## 2020-01-29 ENCOUNTER — Telehealth: Payer: Self-pay | Admitting: Emergency Medicine

## 2020-01-29 NOTE — Telephone Encounter (Signed)
Spoke with pt and pt wife; results given and verbalized understanding of plan of care

## 2020-01-31 ENCOUNTER — Encounter: Payer: Self-pay | Admitting: Nurse Practitioner

## 2020-01-31 ENCOUNTER — Other Ambulatory Visit: Payer: Self-pay | Admitting: Nurse Practitioner

## 2020-01-31 ENCOUNTER — Other Ambulatory Visit: Payer: Self-pay

## 2020-01-31 ENCOUNTER — Ambulatory Visit: Payer: 59 | Admitting: Nurse Practitioner

## 2020-01-31 VITALS — BP 126/80 | HR 81 | Temp 97.6°F | Ht 69.5 in | Wt 156.6 lb

## 2020-01-31 DIAGNOSIS — F1010 Alcohol abuse, uncomplicated: Secondary | ICD-10-CM

## 2020-01-31 DIAGNOSIS — I110 Hypertensive heart disease with heart failure: Secondary | ICD-10-CM

## 2020-01-31 DIAGNOSIS — I509 Heart failure, unspecified: Secondary | ICD-10-CM | POA: Diagnosis not present

## 2020-01-31 DIAGNOSIS — I10 Essential (primary) hypertension: Secondary | ICD-10-CM

## 2020-01-31 NOTE — Progress Notes (Addendum)
This visit occurred during the SARS-CoV-2 public health emergency.  Safety protocols were in place, including screening questions prior to the visit, additional usage of staff PPE, and extensive cleaning of exam room while observing appropriate contact time as indicated for disinfecting solutions.  Subjective:     Patient ID: Austin [name] , male    DOB: [birth year] , 64 y.o.   MRN: [id number]   Chief Complaint  Patient presents with  . Follow-up    patient went to the urgent care and was diagnosed with bronchitis     HPI  He went to work and was told that he did not look good.  He was feeling dizzy.  He reports he had bronchitis.  He was coughing and would vomit at times.  He was diagnosed with new onset CHF.  When he laid flat he would have shortness of breath and would vomit.  He took nyquil and did not get better. He reports he had swelling to his feet but his wife feels this has improved.     Past Medical History:  Diagnosis Date  . Arthritis    hands  . GERD (gastroesophageal reflux disease)   . Hypertension   . Prostate cancer [facility])      Family History  Problem Relation Age of Onset  . Hypertension Mother   . Hypertension Father      Current Outpatient Medications:  .  furosemide (LASIX) 40 MG tablet, Take 1 tablet (40 mg total) by mouth daily., Disp: 5 tablet, Rfl: 0 .  losartan-hydrochlorothiazide (HYZAAR) 100-25 MG tablet, TAKE 1 TABLET BY MOUTH EVERY DAY, Disp: 30 tablet, Rfl: 0 .  Multiple Vitamin (MULTIVITAMIN) tablet, Take 1 tablet by mouth daily., Disp: , Rfl:    No Known Allergies   Review of Systems  Constitutional: Negative.   Respiratory: Negative.   Cardiovascular: Negative for chest pain, palpitations and leg swelling.  Neurological: Positive for dizziness. Negative for headaches.  Psychiatric/Behavioral: Negative.      Today's Vitals   01/31/20 1033  BP: 126/80  Pulse: 81  Temp: 97.6 F (36.4 C)  TempSrc: Oral  Weight: 156 lb 9.6 oz (71  kg)  Height: 5' 9.5" (1.765 m)  PainSc: 0-No pain   Body mass index is 22.79 kg/m.   Objective:  Physical Exam Constitutional:      General: He is not in acute distress.    Appearance: Normal appearance.  Cardiovascular:     Rate and Rhythm: Normal rate and regular rhythm.     Pulses: Normal pulses.     Heart sounds: Normal heart sounds. No murmur.  Pulmonary:     Effort: Pulmonary effort is normal. No respiratory distress.     Breath sounds: Normal breath sounds.  Musculoskeletal:     Right lower leg: Edema (trace) present.     Left lower leg: Edema (trace) present.  Skin:    Capillary Refill: Capillary refill takes less than 2 seconds.  Neurological:     General: No focal deficit present.     Mental Status: He is alert and oriented to person, place, and time.  Psychiatric:        Mood and Affect: Mood normal.        Behavior: Behavior normal.        Thought Content: Thought content normal.        Judgment: Judgment normal.         Assessment And Plan:     1. Essential hypertension Chronic, fair control  He does not follow up on a regular basis at times but his blood pressure is doing okay at this time  2. Acute congestive heart failure, unspecified heart failure type ([place])  New diagnosis after having swelling to feet, coughing and elevated BNP  Treated with lasix x 5 days  He is continue with his blood pressure medication with a diuretic  Advised to avoid alcohol, high salt foods  Will order an ECHO to check for any abnormalities and will possibly refer to cardiology pending results - Brain natriuretic peptide  3. ETOH abuse  Discussed with him the importance to cut back focusing on quitting drinking alcohol.     [name], FNP    THE PATIENT IS ENCOURAGED TO PRACTICE SOCIAL DISTANCING DUE TO THE COVID-19 PANDEMIC.

## 2020-02-01 ENCOUNTER — Other Ambulatory Visit: Payer: Self-pay | Admitting: Nurse Practitioner

## 2020-02-01 DIAGNOSIS — I509 Heart failure, unspecified: Secondary | ICD-10-CM

## 2020-02-01 LAB — BRAIN NATRIURETIC PEPTIDE: BNP: 2128.8 pg/mL — ABNORMAL HIGH (ref 0.0–100.0)

## 2020-02-01 MED ORDER — FUROSEMIDE 20 MG PO TABS: 20.0000 mg | ORAL_TABLET | Freq: Every day | ORAL | 2 refills | Status: DC

## 2020-02-03 ENCOUNTER — Other Ambulatory Visit (HOSPITAL_COMMUNITY): Payer: 59

## 2020-02-09 LAB — HM COLONOSCOPY

## 2020-02-14 ENCOUNTER — Encounter: Payer: Self-pay | Admitting: Internal Medicine

## 2020-02-14 ENCOUNTER — Ambulatory Visit: Payer: 59 | Admitting: Nurse Practitioner

## 2020-02-14 ENCOUNTER — Other Ambulatory Visit: Payer: Self-pay

## 2020-02-14 ENCOUNTER — Encounter: Payer: Self-pay | Admitting: Nurse Practitioner

## 2020-02-14 VITALS — BP 126/84 | HR 78 | Temp 97.7°F | Ht 69.5 in | Wt 156.4 lb

## 2020-02-14 DIAGNOSIS — I10 Essential (primary) hypertension: Secondary | ICD-10-CM

## 2020-02-14 DIAGNOSIS — Z1159 Encounter for screening for other viral diseases: Secondary | ICD-10-CM

## 2020-02-14 DIAGNOSIS — I509 Heart failure, unspecified: Secondary | ICD-10-CM | POA: Diagnosis not present

## 2020-02-14 DIAGNOSIS — I110 Hypertensive heart disease with heart failure: Secondary | ICD-10-CM

## 2020-02-14 MED ORDER — LOSARTAN POTASSIUM-HCTZ 100-25 MG PO TABS: 1.0000 | ORAL_TABLET | Freq: Every day | ORAL | 1 refills | Status: DC

## 2020-02-14 NOTE — Progress Notes (Signed)
This visit occurred during the SARS-CoV-2 public health emergency.  Safety protocols were in place, including screening questions prior to the visit, additional usage of staff PPE, and extensive cleaning of exam room while observing appropriate contact time as indicated for disinfecting solutions.  Subjective:     Patient ID: Austin [name] , male    DOB: June 07, 1956 , 64 y.o.   MRN: [id number]   Chief Complaint  Patient presents with  . Hypertension    HPI  He did not have the ECHO done due to insurance, planning for Wednesday appt.   Follow up new onset CHF. Continues to drink at least 1 - 40 oz beer daily.   He has a decreased appetite.  His wife reports a drastic improvement in his swelling and wheezing, and shortness of breath.   He is no longer coughing and vomiting.    Hypertension This is a chronic problem. The current episode started more than 1 year ago. The problem is unchanged. The problem is controlled. Pertinent negatives include no anxiety, chest pain, headaches, palpitations or PND. There are no associated agents to hypertension. Risk factors for coronary artery disease include sedentary lifestyle. There are no compliance problems.  There is no history of angina. There is no history of chronic renal disease.     Past Medical History:  Diagnosis Date  . Arthritis    hands  . GERD (gastroesophageal reflux disease)   . Hypertension   . Prostate cancer [facility])      Family History  Problem Relation Age of Onset  . Hypertension Mother   . Hypertension Father      Current Outpatient Medications:  .  furosemide (LASIX) 20 MG tablet, Take 1 tablet (20 mg total) by mouth daily., Disp: 30 tablet, Rfl: 2 .  losartan-hydrochlorothiazide (HYZAAR) 100-25 MG tablet, TAKE 1 TABLET BY MOUTH EVERY DAY, Disp: 30 tablet, Rfl: 0 .  Multiple Vitamin (MULTIVITAMIN) tablet, Take 1 tablet by mouth daily., Disp: , Rfl:    No Known Allergies   Review of Systems  Constitutional:  Positive for appetite change (decreased appetite).  Respiratory: Negative.   Cardiovascular: Negative.  Negative for chest pain, palpitations, leg swelling and PND.  Neurological: Negative for dizziness and headaches.  Psychiatric/Behavioral: Negative.      Today's Vitals   02/14/20 0941  BP: 126/84  Pulse: 78  Temp: 97.7 F (36.5 C)  TempSrc: Oral  Weight: 156 lb 6.4 oz (70.9 kg)  Height: 5' 9.5" (1.765 m)  PainSc: 0-No pain   Body mass index is 22.77 kg/m.   Objective:  Physical Exam Constitutional:      Appearance: Normal appearance.  Cardiovascular:     Rate and Rhythm: Normal rate and regular rhythm.     Pulses: Normal pulses.     Heart sounds: Murmur (mild murmur present) present.  Pulmonary:     Effort: Pulmonary effort is normal. No respiratory distress.     Breath sounds: Normal breath sounds.  Skin:    Capillary Refill: Capillary refill takes less than 2 seconds.  Neurological:     General: No focal deficit present.     Mental Status: He is alert and oriented to person, place, and time.         Assessment And Plan:   1. Acute congestive heart failure, unspecified heart failure type [facility])  He is doing much better, swelling is improved  No longer has wheezing, mild murmur present  Will likely refer to Cardiology pending ECHO  He  is advised to increase his water intake - BMP8+eGFR  2. Essential hypertension  Chronic, good control  Continue with current medications - losartan-hydrochlorothiazide (HYZAAR) 100-25 MG tablet; Take 1 tablet by mouth daily.  Dispense: 90 tablet; Refill: 1 - BMP8+eGFR  3. Encounter for hepatitis C screening test for low risk patient Will check for Hepatitis C screening due to being born between the years [birth year] - Hepatitis C antibody   [name], FNP    THE PATIENT IS ENCOURAGED TO PRACTICE SOCIAL DISTANCING DUE TO THE COVID-19 PANDEMIC.

## 2020-02-15 LAB — BMP8+EGFR
BUN/Creatinine Ratio: 16 (ref 10–24)
BUN: 19 mg/dL (ref 8–27)
CO2: 21 mmol/L (ref 20–29)
Calcium: 9.4 mg/dL (ref 8.6–10.2)
Chloride: 94 mmol/L — ABNORMAL LOW (ref 96–106)
Creatinine, Ser: 1.22 mg/dL (ref 0.76–1.27)
GFR calc Af Amer: 72 mL/min/{1.73_m2} (ref >59)
GFR calc non Af Amer: 62 mL/min/{1.73_m2} (ref >59)
Glucose: 68 mg/dL (ref 65–99)
Potassium: 3.7 mmol/L (ref 3.5–5.2)
Sodium: 131 mmol/L — ABNORMAL LOW (ref 134–144)

## 2020-02-15 LAB — HEPATITIS C ANTIBODY: Hep C Virus Ab: <0.1 s/co ratio (ref 0.0–0.9)

## 2020-02-16 ENCOUNTER — Other Ambulatory Visit: Payer: Self-pay | Admitting: Nurse Practitioner

## 2020-02-16 ENCOUNTER — Ambulatory Visit (HOSPITAL_COMMUNITY): Payer: 59 | Attending: Cardiovascular Disease

## 2020-02-16 ENCOUNTER — Other Ambulatory Visit: Payer: Self-pay

## 2020-02-16 ENCOUNTER — Other Ambulatory Visit: Payer: 59

## 2020-02-16 DIAGNOSIS — R931 Abnormal findings on diagnostic imaging of heart and coronary circulation: Secondary | ICD-10-CM

## 2020-02-16 DIAGNOSIS — I509 Heart failure, unspecified: Secondary | ICD-10-CM

## 2020-02-16 DIAGNOSIS — I10 Essential (primary) hypertension: Secondary | ICD-10-CM | POA: Diagnosis present

## 2020-02-16 MED ORDER — PERFLUTREN LIPID MICROSPHERE
1.0000 mL | INTRAVENOUS | Status: AC | PRN
  Administered 2020-02-16: 2 mL via INTRAVENOUS

## 2020-02-17 ENCOUNTER — Telehealth: Payer: Self-pay | Admitting: Cardiology

## 2020-02-17 ENCOUNTER — Other Ambulatory Visit: Payer: Self-pay | Admitting: Nurse Practitioner

## 2020-02-17 DIAGNOSIS — R931 Abnormal findings on diagnostic imaging of heart and coronary circulation: Secondary | ICD-10-CM

## 2020-02-17 DIAGNOSIS — I509 Heart failure, unspecified: Secondary | ICD-10-CM

## 2020-02-17 LAB — BRAIN NATRIURETIC PEPTIDE: BNP: 583.9 pg/mL — ABNORMAL HIGH (ref 0.0–100.0)

## 2020-02-17 MED ORDER — ENTRESTO 24-26 MG PO TABS: 1.0000 | ORAL_TABLET | Freq: Two times a day (BID) | ORAL | 2 refills | Status: DC

## 2020-02-17 NOTE — Telephone Encounter (Signed)
Pts wife is calling requesting to accompany the pt to his OV appt with Dr. Gardiner Rhyme on 02/22/20.  Wife states the pt does not have any disabilities that require him to have assistance, but he is just a poor historian with giving his medical information to Providers, and remembering what the Provider tells him to do after the visit is complete.  Wife is aware of our visitor policy and being he has no mental or physical disabilities, she is ok with that, but would like to be included in on the appt via cell phone, when Dr.Schumann is doing the visit with the pt.  Wife states she knows his medications the best, and she will need to know if any changes or testing is recommended at that visit.  Informed the pts Wife Austin [name] that we can absolutely accommodate having her on the speaker phone while Dr. Gardiner Rhyme is seeing the pt on 3/16.  Informed the pts Wife that I will route this message to Dr. Gardiner Rhyme and his RN to make them aware that wife would like to be included on pt the  OV via cell phone.  Her number they should call was confirmed as [phone].  Updated this in appt notes as well. Wife verbalized understanding and agrees with this plan.

## 2020-02-17 NOTE — Telephone Encounter (Signed)
Wife, Cecille Rubin, is requesting to come with patient to his appt with Dr. Gardiner Rhyme on 02/22/20. She states that she goes with him to all of his appts because he can tend to get confused then upset. Please advise.

## 2020-02-20 NOTE — H&P (View-Only) (Signed)
Cardiology Office Note:    Date:  02/22/2020   ID:  Austin [name], DOB May 06, 1956, MRN [id number]  PCP:  [name], MD  Cardiologist:  No primary care provider on file.  Electrophysiologist:  None   Referring MD: [name], FNP   Chief Complaint  Patient presents with  . Congestive Heart Failure    History of Present Illness:    Austin [name] is a 64 y.o. male with a hx of prostate cancer, hypertension, alcohol use who is referred by [name], FNP for evaluation of newly diagnosed systolic heart failure.  He presented to urgent care on 01/28/2020 with nausea, vomiting, diarrhea, cough, and dyspnea on exertion.  Chest x-ray showed bilateral small pleural effusions.  BNP was elevated.  Discharged on Lasix 40 mg daily x5 days.  TTE on 02/16/2020 showed EF less than 20%, global hypokinesis, severe LV dilatation, grade 3 diastolic dysfunction, severe RV dysfunction, severe left atrial dilatation, severe right atrial dilatation, mild mitral regurgitation, mild to moderate tricuspid regurgitation.  He was started on Entresto on 3/11.  Denies any lightheadedness, dizziness, syncope.  Reports dyspnea has improved with Lasix, currently on 20 mg daily.  He denies any chest pain but does have dyspnea on exertion.  No smoking history.  Reports he would drink about 5 beers/day, now drinking about 1 beer per day.  Brother had stent in 34s    Past Medical History:  Diagnosis Date  . Arthritis    hands  . GERD (gastroesophageal reflux disease)   . Hypertension   . Prostate cancer [facility])     Past Surgical History:  Procedure Laterality Date  . LYMPHADENECTOMY Bilateral 01/26/2014   Procedure: LYMPHADENECTOMY WITH INDOCYANINE GREEN DYE;  Surgeon: Alexis Frock, MD;  Location: [facility];  Service: Urology;  Laterality: Bilateral;  . ROBOT ASSISTED LAPAROSCOPIC RADICAL PROSTATECTOMY N/A 01/26/2014   Procedure: ROBOTIC ASSISTED LAPAROSCOPIC RADICAL PROSTATECTOMY;  Surgeon: Alexis Frock,  MD;  Location: [facility];  Service: Urology;  Laterality: N/A;  . SHOULDER SURGERY Left     Current Medications: Current Meds  Medication Sig  . furosemide (LASIX) 20 MG tablet Take 1 tablet (20 mg total) by mouth daily.  . Multiple Vitamin (MULTIVITAMIN) tablet Take 1 tablet by mouth daily.  . [DISCONTINUED] sacubitril-valsartan (ENTRESTO) 24-26 MG Take 1 tablet by mouth 2 (two) times daily.     Allergies:   Patient has no known allergies.   Social History   Socioeconomic History  . Marital status: Married    Spouse name: Not on file  . Number of children: Not on file  . Years of education: Not on file  . Highest education level: Not on file  Occupational History  . Not on file  Tobacco Use  . Smoking status: Never Smoker  . Smokeless tobacco: Never Used  Substance and Sexual Activity  . Alcohol use: Yes    Comment: 3 beers daily  . Drug use: No  . Sexual activity: Not on file  Other Topics Concern  . Not on file  Social History Narrative  . Not on file   Social Determinants of Health   Financial Resource Strain:   . Difficulty of Paying Living Expenses:   Food Insecurity:   . Worried About Charity fundraiser in the Last Year:   . Arboriculturist in the Last Year:   Transportation Needs:   . Film/video editor (Medical):   Marland Kitchen Lack of Transportation (Non-Medical):   Physical Activity:   .  Days of Exercise per Week:   . Minutes of Exercise per Session:   Stress:   . Feeling of Stress :   Social Connections:   . Frequency of Communication with Friends and Family:   . Frequency of Social Gatherings with Friends and Family:   . Attends Religious Services:   . Active Member of Clubs or Organizations:   . Attends Archivist Meetings:   Marland Kitchen Marital Status:      Family History: The patient's family history includes Hypertension in his father and mother.  ROS:   Please see the history of present illness.     All other systems reviewed and are  negative.  EKGs/Labs/Other Studies Reviewed:    The following studies were reviewed today:   EKG:  EKG is  ordered today.  The ekg ordered today demonstrates normal sinus rhythm, rate 100, LVH with repolarization abnormalities, QTC 464  TTE 02/16/20: 1. Left ventricular ejection fraction, by estimation, is <20%. The left  ventricle has severely decreased function. The left ventricle demonstrates  global hypokinesis. The left ventricular internal cavity size was severely  dilated. Left ventricular  diastolic parameters are consistent with Grade III diastolic dysfunction  (restrictive). There is the interventricular septum is flattened in  diastole ('D' shaped left ventricle), consistent with right ventricular  volume overload.  2. Right ventricular systolic function is severely reduced. The right  ventricular size is severely enlarged. There is normal pulmonary artery  systolic pressure.  3. Left atrial size was severely dilated.  4. Right atrial size was severely dilated.  5. The mitral valve is normal in structure. Mild mitral valve  regurgitation. No evidence of mitral stenosis.  6. Tricuspid valve regurgitation is mild to moderate.  7. The aortic valve is normal in structure. Aortic valve regurgitation is  not visualized. No aortic stenosis is present.    Recent Labs: 01/28/2020: Hemoglobin 9.9; Platelets 203 02/14/2020: BUN 19; Creatinine, Ser 1.22; Potassium 3.7; Sodium 131 02/16/2020: BNP 583.9  Recent Lipid Panel No results found for: CHOL, TRIG, HDL, CHOLHDL, VLDL, LDLCALC, LDLDIRECT  Physical Exam:    VS:  BP (!) 82/64 (BP Location: Left Arm, Patient Position: Sitting, Cuff Size: Normal)   Pulse 100   Temp (!) 97.2 F (36.2 C)   Ht 5' 9.5" (1.765 m)   Wt 164 lb (74.4 kg)   SpO2 99%   BMI 23.87 kg/m     Wt Readings from Last 3 Encounters:  02/22/20 164 lb (74.4 kg)  02/14/20 156 lb 6.4 oz (70.9 kg)  01/31/20 156 lb 9.6 oz (71 kg)     GEN: in no acute  distress HEENT: Normal NECK: + JVD LYMPHATICS: No lymphadenopathy CARDIAC: RRR, no murmurs, rubs, gallops RESPIRATORY:  Clear to auscultation without rales, wheezing or rhonchi  ABDOMEN: Soft, non-tender, non-distended MUSCULOSKELETAL:  1+ BLE edema SKIN: Warm and dry NEUROLOGIC:  Alert and oriented x 3 PSYCHIATRIC:  Normal affect   ASSESSMENT:    1. Acute combined systolic and diastolic heart failure (HCC)   2. Cardiomyopathy, unspecified type ([place])   3. Hypotension, unspecified hypotension type   4. Pre-procedure lab exam   5. Lipid screening   6. Alcohol use    PLAN:    Acute combined systolic and diastolic heart failure: new diagnosis, TTE on 02/16/2020 showed EF less than 20%.  Unclear etiology, differential includes ischemia and alcohol use -Started on Entresto 24-26 mg on 02/17/2020.  Hypotensive to 82/64 in clinic today, will discontinue Entresto.   -  Will hold off on starting other GDMT meds given significant hypotension in clinic today.  Will monitor BP off Entresto and start Toprol-XL and losartan as able -On Lasix 20 mg daily.  Appears mildly hypervolemic on exam, would continue Lasix -Check BMET, CBC, lipid panel -LHC/RHC.  Risks and benefits of cardiac catheterization have been discussed with the patient.  These include bleeding, infection, kidney damage, stroke, heart attack, death.  The patient understands these risks and is willing to proceed.  Hypotension: BP 82/64 in clinic today.  He is asymptomatic.  Suspect secondary to [facility] use, will discontinue.  Advised that if develops symptoms such as lightheadedness or syncope, or worsening dyspnea, will need evaluation in ED  Alcohol use: Was drinking at least 5 beers after work each night, has cut back to 1 drink per day.  Encouraged cessation  RTC in 1 week  Medication Adjustments/Labs and Tests Ordered: Current medicines are reviewed at length with the patient today.  Concerns regarding medicines are outlined  above.  Orders Placed This Encounter  Procedures  . Basic metabolic panel  . CBC  . Lipid panel  . EKG 12-Lead   No orders of the defined types were placed in this encounter.   Patient Instructions  Medication Instructions:  STOP Entresto  *If you need a refill on your cardiac medications before your next appointment, please call your pharmacy*   Lab Work: BMET, CBC, [place] at 11:05 AM [address]   If you have labs (blood work) drawn today and your tests are completely normal, you will receive your results only by: Marland Kitchen MyChart Message (if you have MyChart) OR . A paper copy in the mail If you have any lab test that is abnormal or we need to change your treatment, we will call you to review the results.   Testing/Procedures: Your physician has requested that you have a cardiac catheterization. Cardiac catheterization is used to diagnose and/or treat various heart conditions. Doctors may recommend this procedure for a number of different reasons. The most common reason is to evaluate chest pain. Chest pain can be a symptom of coronary artery disease (CAD), and cardiac catheterization can show whether plaque is narrowing or blocking your heart's arteries. This procedure is also used to evaluate the valves, as well as measure the blood flow and oxygen levels in different parts of your heart. For further information please visit HugeFiesta.[email or web address]. Please follow instruction sheet, as given.  Follow-Up: At [facility], you and your health needs are our priority.  As part of our continuing mission to provide you with exceptional heart care, we have created designated Provider Care Teams.  These Care Teams include your primary Cardiologist (physician) and Advanced Practice Providers (APPs -  Physician Assistants and Nurse Practitioners) who all work together to provide you with the care you need, when you need it.  We recommend signing up for the  patient portal called "MyChart".  Sign up information is provided on this After Visit Summary.  MyChart is used to connect with patients for Virtual Visits (Telemedicine).  Patients are able to view lab/test results, encounter notes, upcoming appointments, etc.  Non-urgent messages can be sent to your provider as well.   To learn more about what you can do with MyChart, go to [email or web address].    Your next appointment:   3/26 at 11:20 AM with Dr. Gardiner Rhyme  Other Instructions Take blood pressure at home-call tomorrow with readings  Signed, Donato Heinz, MD  02/22/2020 11:54 AM    [place]

## 2020-02-20 NOTE — Progress Notes (Signed)
Cardiology Office Note:    Date:  02/22/2020   ID:  Austin [name], DOB [birth year], MRN [id number]  PCP:  [name], MD  Cardiologist:  No primary care provider on file.  Electrophysiologist:  None   Referring MD: [name], FNP   Chief Complaint  Patient presents with  . Congestive Heart Failure    History of Present Illness:    Austin [name] is a 64 y.o. male with a hx of prostate cancer, hypertension, alcohol use who is referred by [name], FNP for evaluation of newly diagnosed systolic heart failure.  He presented to urgent care on 01/28/2020 with nausea, vomiting, diarrhea, cough, and dyspnea on exertion.  Chest x-ray showed bilateral small pleural effusions.  BNP was elevated.  Discharged on Lasix 40 mg daily x5 days.  TTE on 02/16/2020 showed EF less than 20%, global hypokinesis, severe LV dilatation, grade 3 diastolic dysfunction, severe RV dysfunction, severe left atrial dilatation, severe right atrial dilatation, mild mitral regurgitation, mild to moderate tricuspid regurgitation.  He was started on Entresto on 3/11.  Denies any lightheadedness, dizziness, syncope.  Reports dyspnea has improved with Lasix, currently on 20 mg daily.  He denies any chest pain but does have dyspnea on exertion.  No smoking history.  Reports he would drink about 5 beers/day, now drinking about 1 beer per day.  Brother had stent in 38s    Past Medical History:  Diagnosis Date  . Arthritis    hands  . GERD (gastroesophageal reflux disease)   . Hypertension   . Prostate cancer [facility])     Past Surgical History:  Procedure Laterality Date  . LYMPHADENECTOMY Bilateral 01/26/2014   Procedure: LYMPHADENECTOMY WITH INDOCYANINE GREEN DYE;  Surgeon: Alexis Frock, MD;  Location: [facility];  Service: Urology;  Laterality: Bilateral;  . ROBOT ASSISTED LAPAROSCOPIC RADICAL PROSTATECTOMY N/A 01/26/2014   Procedure: ROBOTIC ASSISTED LAPAROSCOPIC RADICAL PROSTATECTOMY;  Surgeon: Alexis Frock,  MD;  Location: [facility];  Service: Urology;  Laterality: N/A;  . SHOULDER SURGERY Left     Current Medications: Current Meds  Medication Sig  . furosemide (LASIX) 20 MG tablet Take 1 tablet (20 mg total) by mouth daily.  . Multiple Vitamin (MULTIVITAMIN) tablet Take 1 tablet by mouth daily.  . [DISCONTINUED] sacubitril-valsartan (ENTRESTO) 24-26 MG Take 1 tablet by mouth 2 (two) times daily.     Allergies:   Patient has no known allergies.   Social History   Socioeconomic History  . Marital status: Married    Spouse name: Not on file  . Number of children: Not on file  . Years of education: Not on file  . Highest education level: Not on file  Occupational History  . Not on file  Tobacco Use  . Smoking status: Never Smoker  . Smokeless tobacco: Never Used  Substance and Sexual Activity  . Alcohol use: Yes    Comment: 3 beers daily  . Drug use: No  . Sexual activity: Not on file  Other Topics Concern  . Not on file  Social History Narrative  . Not on file   Social Determinants of Health   Financial Resource Strain:   . Difficulty of Paying Living Expenses:   Food Insecurity:   . Worried About Charity fundraiser in the Last Year:   . Arboriculturist in the Last Year:   Transportation Needs:   . Film/video editor (Medical):   Marland Kitchen Lack of Transportation (Non-Medical):   Physical Activity:   .  Days of Exercise per Week:   . Minutes of Exercise per Session:   Stress:   . Feeling of Stress :   Social Connections:   . Frequency of Communication with Friends and Family:   . Frequency of Social Gatherings with Friends and Family:   . Attends Religious Services:   . Active Member of Clubs or Organizations:   . Attends Archivist Meetings:   Marland Kitchen Marital Status:      Family History: The patient's family history includes Hypertension in his father and mother.  ROS:   Please see the history of present illness.     All other systems reviewed and are  negative.  EKGs/Labs/Other Studies Reviewed:    The following studies were reviewed today:   EKG:  EKG is  ordered today.  The ekg ordered today demonstrates normal sinus rhythm, rate 100, LVH with repolarization abnormalities, QTC 464  TTE 02/16/20: 1. Left ventricular ejection fraction, by estimation, is <20%. The left  ventricle has severely decreased function. The left ventricle demonstrates  global hypokinesis. The left ventricular internal cavity size was severely  dilated. Left ventricular  diastolic parameters are consistent with Grade III diastolic dysfunction  (restrictive). There is the interventricular septum is flattened in  diastole ('D' shaped left ventricle), consistent with right ventricular  volume overload.  2. Right ventricular systolic function is severely reduced. The right  ventricular size is severely enlarged. There is normal pulmonary artery  systolic pressure.  3. Left atrial size was severely dilated.  4. Right atrial size was severely dilated.  5. The mitral valve is normal in structure. Mild mitral valve  regurgitation. No evidence of mitral stenosis.  6. Tricuspid valve regurgitation is mild to moderate.  7. The aortic valve is normal in structure. Aortic valve regurgitation is  not visualized. No aortic stenosis is present.    Recent Labs: 01/28/2020: Hemoglobin 9.9; Platelets 203 02/14/2020: BUN 19; Creatinine, Ser 1.22; Potassium 3.7; Sodium 131 02/16/2020: BNP 583.9  Recent Lipid Panel No results found for: CHOL, TRIG, HDL, CHOLHDL, VLDL, LDLCALC, LDLDIRECT  Physical Exam:    VS:  BP (!) 82/64 (BP Location: Left Arm, Patient Position: Sitting, Cuff Size: Normal)   Pulse 100   Temp (!) 97.2 F (36.2 C)   Ht 5' 9.5" (1.765 m)   Wt 164 lb (74.4 kg)   SpO2 99%   BMI 23.87 kg/m     Wt Readings from Last 3 Encounters:  02/22/20 164 lb (74.4 kg)  02/14/20 156 lb 6.4 oz (70.9 kg)  01/31/20 156 lb 9.6 oz (71 kg)     GEN: in no acute  distress HEENT: Normal NECK: + JVD LYMPHATICS: No lymphadenopathy CARDIAC: RRR, no murmurs, rubs, gallops RESPIRATORY:  Clear to auscultation without rales, wheezing or rhonchi  ABDOMEN: Soft, non-tender, non-distended MUSCULOSKELETAL:  1+ BLE edema SKIN: Warm and dry NEUROLOGIC:  Alert and oriented x 3 PSYCHIATRIC:  Normal affect   ASSESSMENT:    1. Acute combined systolic and diastolic heart failure (HCC)   2. Cardiomyopathy, unspecified type ([place])   3. Hypotension, unspecified hypotension type   4. Pre-procedure lab exam   5. Lipid screening   6. Alcohol use    PLAN:    Acute combined systolic and diastolic heart failure: new diagnosis, TTE on 02/16/2020 showed EF less than 20%.  Unclear etiology, differential includes ischemia and alcohol use -Started on Entresto 24-26 mg on 02/17/2020.  Hypotensive to 82/64 in clinic today, will discontinue Entresto.   -  Will hold off on starting other GDMT meds given significant hypotension in clinic today.  Will monitor BP off Entresto and start Toprol-XL and losartan as able -On Lasix 20 mg daily.  Appears mildly hypervolemic on exam, would continue Lasix -Check BMET, CBC, lipid panel -LHC/RHC.  Risks and benefits of cardiac catheterization have been discussed with the patient.  These include bleeding, infection, kidney damage, stroke, heart attack, death.  The patient understands these risks and is willing to proceed.  Hypotension: BP 82/64 in clinic today.  He is asymptomatic.  Suspect secondary to [facility] use, will discontinue.  Advised that if develops symptoms such as lightheadedness or syncope, or worsening dyspnea, will need evaluation in ED  Alcohol use: Was drinking at least 5 beers after work each night, has cut back to 1 drink per day.  Encouraged cessation  RTC in 1 week  Medication Adjustments/Labs and Tests Ordered: Current medicines are reviewed at length with the patient today.  Concerns regarding medicines are outlined  above.  Orders Placed This Encounter  Procedures  . Basic metabolic panel  . CBC  . Lipid panel  . EKG 12-Lead   No orders of the defined types were placed in this encounter.   Patient Instructions  Medication Instructions:  STOP Entresto  *If you need a refill on your cardiac medications before your next appointment, please call your pharmacy*   Lab Work: BMET, CBC, [place] at 11:05 AM [address]   If you have labs (blood work) drawn today and your tests are completely normal, you will receive your results only by: Marland Kitchen MyChart Message (if you have MyChart) OR . A paper copy in the mail If you have any lab test that is abnormal or we need to change your treatment, we will call you to review the results.   Testing/Procedures: Your physician has requested that you have a cardiac catheterization. Cardiac catheterization is used to diagnose and/or treat various heart conditions. Doctors may recommend this procedure for a number of different reasons. The most common reason is to evaluate chest pain. Chest pain can be a symptom of coronary artery disease (CAD), and cardiac catheterization can show whether plaque is narrowing or blocking your heart's arteries. This procedure is also used to evaluate the valves, as well as measure the blood flow and oxygen levels in different parts of your heart. For further information please visit HugeFiesta.tn. Please follow instruction sheet, as given.  Follow-Up: At [facility], you and your health needs are our priority.  As part of our continuing mission to provide you with exceptional heart care, we have created designated Provider Care Teams.  These Care Teams include your primary Cardiologist (physician) and Advanced Practice Providers (APPs -  Physician Assistants and Nurse Practitioners) who all work together to provide you with the care you need, when you need it.  We recommend signing up for the  patient portal called "MyChart".  Sign up information is provided on this After Visit Summary.  MyChart is used to connect with patients for Virtual Visits (Telemedicine).  Patients are able to view lab/test results, encounter notes, upcoming appointments, etc.  Non-urgent messages can be sent to your provider as well.   To learn more about what you can do with MyChart, go to [email or web address].    Your next appointment:   3/26 at 11:20 AM with Dr. Gardiner Rhyme  Other Instructions Take blood pressure at home-call tomorrow with readings  Signed, Donato Heinz, MD  02/22/2020 11:54 AM    [place]

## 2020-02-22 ENCOUNTER — Encounter: Payer: Self-pay | Admitting: Cardiology

## 2020-02-22 ENCOUNTER — Ambulatory Visit: Payer: 59 | Admitting: Cardiology

## 2020-02-22 ENCOUNTER — Other Ambulatory Visit: Payer: Self-pay

## 2020-02-22 ENCOUNTER — Other Ambulatory Visit (HOSPITAL_COMMUNITY)
Admission: RE | Admit: 2020-02-22 | Discharge: 2020-02-22 | Disposition: A | Discharge status: Home / Self Care | Payer: 59 | Source: Ambulatory Visit | Attending: Cardiovascular Disease | Admitting: Cardiovascular Disease

## 2020-02-22 VITALS — BP 82/64 | HR 100 | Temp 97.2°F | Ht 69.5 in | Wt 164.0 lb

## 2020-02-22 DIAGNOSIS — I5041 Acute combined systolic (congestive) and diastolic (congestive) heart failure: Secondary | ICD-10-CM | POA: Diagnosis not present

## 2020-02-22 DIAGNOSIS — Z789 Other specified health status: Secondary | ICD-10-CM

## 2020-02-22 DIAGNOSIS — Z01812 Encounter for preprocedural laboratory examination: Secondary | ICD-10-CM | POA: Insufficient documentation

## 2020-02-22 DIAGNOSIS — I429 Cardiomyopathy, unspecified: Secondary | ICD-10-CM | POA: Diagnosis not present

## 2020-02-22 DIAGNOSIS — Z20822 Contact with and (suspected) exposure to covid-19: Secondary | ICD-10-CM | POA: Diagnosis not present

## 2020-02-22 DIAGNOSIS — F1090 Alcohol use, unspecified, uncomplicated: Secondary | ICD-10-CM

## 2020-02-22 DIAGNOSIS — Z7289 Other problems related to lifestyle: Secondary | ICD-10-CM

## 2020-02-22 DIAGNOSIS — I959 Hypotension, unspecified: Secondary | ICD-10-CM

## 2020-02-22 DIAGNOSIS — Z13220 Encounter for screening for lipoid disorders: Secondary | ICD-10-CM

## 2020-02-22 LAB — LIPID PANEL
Chol/HDL Ratio: 1.6 ratio (ref 0.0–5.0)
Cholesterol, Total: 125 mg/dL (ref 100–199)
HDL: 78 mg/dL (ref >39)
LDL Chol Calc (NIH): 28 mg/dL (ref 0–99)
Triglycerides: 103 mg/dL (ref 0–149)
VLDL Cholesterol Cal: 19 mg/dL (ref 5–40)

## 2020-02-22 LAB — CBC
Hematocrit: 31.2 % — ABNORMAL LOW (ref 37.5–51.0)
Hemoglobin: 10.3 g/dL — ABNORMAL LOW (ref 13.0–17.7)
MCH: 30 pg (ref 26.6–33.0)
MCHC: 33 g/dL (ref 31.5–35.7)
MCV: 91 fL (ref 79–97)
Platelets: 217 10*3/uL (ref 150–450)
RBC: 3.43 x10E6/uL — ABNORMAL LOW (ref 4.14–5.80)
RDW: 18.1 % — ABNORMAL HIGH (ref 11.6–15.4)
WBC: 4.9 10*3/uL (ref 3.4–10.8)

## 2020-02-22 LAB — BASIC METABOLIC PANEL
BUN/Creatinine Ratio: 10 (ref 10–24)
BUN: 9 mg/dL (ref 8–27)
CO2: 16 mmol/L — ABNORMAL LOW (ref 20–29)
Calcium: 9.3 mg/dL (ref 8.6–10.2)
Chloride: 91 mmol/L — ABNORMAL LOW (ref 96–106)
Creatinine, Ser: 0.88 mg/dL (ref 0.76–1.27)
GFR calc Af Amer: 105 mL/min/{1.73_m2} (ref >59)
GFR calc non Af Amer: 91 mL/min/{1.73_m2} (ref >59)
Glucose: 74 mg/dL (ref 65–99)
Potassium: 5.5 mmol/L — ABNORMAL HIGH (ref 3.5–5.2)
Sodium: 122 mmol/L — ABNORMAL LOW (ref 134–144)

## 2020-02-22 LAB — SARS CORONAVIRUS 2 (TAT 6-24 HRS): SARS Coronavirus 2: NEGATIVE

## 2020-02-22 NOTE — Patient Instructions (Signed)
Medication Instructions:  STOP Entresto  *If you need a refill on your cardiac medications before your next appointment, please call your pharmacy*   Lab Work: BMET, CBC, Lipid TODAY  COVID TEST TODAY at 11:05 AM [address]   If you have labs (blood work) drawn today and your tests are completely normal, you will receive your results only by: Marland Kitchen MyChart Message (if you have MyChart) OR . A paper copy in the mail If you have any lab test that is abnormal or we need to change your treatment, we will call you to review the results.   Testing/Procedures: Your physician has requested that you have a cardiac catheterization. Cardiac catheterization is used to diagnose and/or treat various heart conditions. Doctors may recommend this procedure for a number of different reasons. The most common reason is to evaluate chest pain. Chest pain can be a symptom of coronary artery disease (CAD), and cardiac catheterization can show whether plaque is narrowing or blocking your heart's arteries. This procedure is also used to evaluate the valves, as well as measure the blood flow and oxygen levels in different parts of your heart. For further information please visit HugeFiesta.tn. Please follow instruction sheet, as given.  Follow-Up: At [facility], you and your health needs are our priority.  As part of our continuing mission to provide you with exceptional heart care, we have created designated Provider Care Teams.  These Care Teams include your primary Cardiologist (physician) and Advanced Practice Providers (APPs -  Physician Assistants and Nurse Practitioners) who all work together to provide you with the care you need, when you need it.  We recommend signing up for the patient portal called "MyChart".  Sign up information is provided on this After Visit Summary.  MyChart is used to connect with patients for Virtual Visits (Telemedicine).  Patients are able to view lab/test results,  encounter notes, upcoming appointments, etc.  Non-urgent messages can be sent to your provider as well.   To learn more about what you can do with MyChart, go to [email or web address].    Your next appointment:   3/26 at 11:20 AM with Dr. Gardiner Rhyme  Other Instructions Take blood pressure at home-call tomorrow with readings

## 2020-02-23 ENCOUNTER — Telehealth: Payer: Self-pay | Admitting: Cardiology

## 2020-02-23 NOTE — Telephone Encounter (Signed)
  Pt's wife calling, she said she's returning call from [facility].   Please call

## 2020-02-23 NOTE — Telephone Encounter (Signed)
Spoke to wife: reporting blood pressure readings as requested by Dr. Gardiner Rhyme after stopping Delene Loll yesterday.   BP today 88/67 92/72 95/75.  Patient denies dizziness, lightheadedness.   Advised to continue to monitor as this is improving since OV yesterday.    Ok to call with updated readings tomorrow as well.   She agreed and verbalized understanding.

## 2020-02-24 ENCOUNTER — Telehealth: Payer: Self-pay | Admitting: *Deleted

## 2020-02-24 NOTE — Telephone Encounter (Signed)
Spoke with pt wife, she reports the patient is feeling fine. Will forward to haley, dr schumann's nurse.

## 2020-02-24 NOTE — Telephone Encounter (Addendum)
Pt contacted pre-catheterization scheduled at [facility] for: Friday February 25, 2020 7:30 AM Verified arrival time and place: [facility]) at: 5:30 AM   No solid food after midnight prior to cath, clear liquids until 5 AM day of procedure.  Hold: Lasix-AM of procedure  Except hold medications AM meds can be  taken pre-cath with sip of water including: ASA 81 mg   Confirmed patient has responsible adult to drive home post procedure and observe 24 hours after arriving home: yes  Currently, due to Covid-19 pandemic, only one person will be allowed with patient. Must be the same person for patient's entire stay and will be required to wear a mask. They will be asked to wait in the waiting room for the duration of the patient's stay.  Patients are required to wear a mask when they enter the hospital.      COVID-19 Pre-Screening Questions:  . In the past 7 to 10 days have you had a cough,  shortness of breath, headache, congestion, fever (100 or greater) body aches, chills, sore throat, or sudden loss of taste or sense of smell? Per pt's wife, some symptoms related to current heart problem . Have you been around anyone with known Covid 19 in the past 7-10 days? no . Have you been around anyone who is awaiting Covid 19 test results in the past 7 to 10 days? no . Have you been around anyone who has been exposed to Covid 19, or has mentioned symptoms of Covid 19 within the past 7 to 10 days? no   I reviewed procedure/mask/visitor instructions, COVID-19 screening questions with pt's wife (DPR), she verbalized understanding, thanked me for call.               Pt's wife aware per Dr Lajoyce Lauber tomorrow morning at hospital.

## 2020-02-24 NOTE — Telephone Encounter (Signed)
Pt c/o BP issue: STAT if pt c/o blurred vision, one-sided weakness or slurred speech  1. What are your last 5 BP readings?   02/24/20:   7:30 AM - 89/70 7:53 AM - 130/80 8:10 AM - 100/76  2. Are you having any other symptoms (ex. Dizziness, headache, blurred vision, passed out)? No  3. What is your BP issue? Patient's wife, Dory Larsen states she is calling to report updated BP readings to [facility]. Please call to discuss.

## 2020-02-25 ENCOUNTER — Encounter (HOSPITAL_COMMUNITY)
Admission: AD | Disposition: A | Discharge status: Home / Self Care | Payer: Self-pay | Source: Home / Self Care | Attending: Cardiovascular Disease

## 2020-02-25 ENCOUNTER — Ambulatory Visit (HOSPITAL_COMMUNITY)
Admission: AD | Admit: 2020-02-25 | Discharge: 2020-02-25 | Disposition: A | Discharge status: Home / Self Care | Payer: 59 | Attending: Cardiovascular Disease | Admitting: Cardiovascular Disease

## 2020-02-25 DIAGNOSIS — I5022 Chronic systolic (congestive) heart failure: Secondary | ICD-10-CM | POA: Diagnosis not present

## 2020-02-25 DIAGNOSIS — I110 Hypertensive heart disease with heart failure: Secondary | ICD-10-CM | POA: Insufficient documentation

## 2020-02-25 DIAGNOSIS — M19042 Primary osteoarthritis, left hand: Secondary | ICD-10-CM | POA: Diagnosis not present

## 2020-02-25 DIAGNOSIS — I428 Other cardiomyopathies: Secondary | ICD-10-CM | POA: Diagnosis present

## 2020-02-25 DIAGNOSIS — I959 Hypotension, unspecified: Secondary | ICD-10-CM | POA: Diagnosis not present

## 2020-02-25 DIAGNOSIS — Z79899 Other long term (current) drug therapy: Secondary | ICD-10-CM | POA: Diagnosis not present

## 2020-02-25 DIAGNOSIS — Z8249 Family history of ischemic heart disease and other diseases of the circulatory system: Secondary | ICD-10-CM | POA: Insufficient documentation

## 2020-02-25 DIAGNOSIS — M19041 Primary osteoarthritis, right hand: Secondary | ICD-10-CM | POA: Insufficient documentation

## 2020-02-25 HISTORY — PX: RIGHT/LEFT HEART CATH AND CORONARY ANGIOGRAPHY: CATH118266

## 2020-02-25 LAB — POCT I-STAT 7, (LYTES, BLD GAS, ICA,H+H)
Acid-base deficit: 4 mmol/L — ABNORMAL HIGH (ref 0.0–2.0)
Bicarbonate: 20.7 mmol/L (ref 20.0–28.0)
Calcium, Ion: 1.24 mmol/L (ref 1.15–1.40)
HCT: 32 % — ABNORMAL LOW (ref 39.0–52.0)
Hemoglobin: 10.9 g/dL — ABNORMAL LOW (ref 13.0–17.0)
O2 Saturation: 99 %
Potassium: 3.7 mmol/L (ref 3.5–5.1)
Sodium: 129 mmol/L — ABNORMAL LOW (ref 135–145)
TCO2: 22 mmol/L (ref 22–32)
pCO2 arterial: 34.1 mmHg (ref 32.0–48.0)
pH, Arterial: 7.391 (ref 7.350–7.450)
pO2, Arterial: 142 mmHg — ABNORMAL HIGH (ref 83.0–108.0)

## 2020-02-25 LAB — BASIC METABOLIC PANEL
Anion gap: 12 (ref 5–15)
BUN: 12 mg/dL (ref 8–23)
CO2: 23 mmol/L (ref 22–32)
Calcium: 9.4 mg/dL (ref 8.9–10.3)
Chloride: 91 mmol/L — ABNORMAL LOW (ref 98–111)
Creatinine, Ser: 0.94 mg/dL (ref 0.61–1.24)
GFR calc Af Amer: >60 mL/min (ref >60)
GFR calc non Af Amer: >60 mL/min (ref >60)
Glucose, Bld: 90 mg/dL (ref 70–99)
Potassium: 3.9 mmol/L (ref 3.5–5.1)
Sodium: 126 mmol/L — ABNORMAL LOW (ref 135–145)

## 2020-02-25 LAB — POCT I-STAT EG7
Acid-base deficit: 4 mmol/L — ABNORMAL HIGH (ref 0.0–2.0)
Bicarbonate: 21.3 mmol/L (ref 20.0–28.0)
Calcium, Ion: 1.09 mmol/L — ABNORMAL LOW (ref 1.15–1.40)
HCT: 31 % — ABNORMAL LOW (ref 39.0–52.0)
Hemoglobin: 10.5 g/dL — ABNORMAL LOW (ref 13.0–17.0)
O2 Saturation: 75 %
Potassium: 3.5 mmol/L (ref 3.5–5.1)
Sodium: 132 mmol/L — ABNORMAL LOW (ref 135–145)
TCO2: 22 mmol/L (ref 22–32)
pCO2, Ven: 38.1 mmHg — ABNORMAL LOW (ref 44.0–60.0)
pH, Ven: 7.355 (ref 7.250–7.430)
pO2, Ven: 42 mmHg (ref 32.0–45.0)

## 2020-02-25 SURGERY — RIGHT/LEFT HEART CATH AND CORONARY ANGIOGRAPHY
Anesthesia: LOCAL

## 2020-02-25 MED ORDER — HYDRALAZINE HCL 20 MG/ML IJ SOLN: 10.0000 mg | INTRAMUSCULAR | Status: DC | PRN

## 2020-02-25 MED ORDER — SODIUM CHLORIDE 0.9% FLUSH: 3.0000 mL | INTRAVENOUS | Status: DC | PRN

## 2020-02-25 MED ORDER — FENTANYL CITRATE (PF) 100 MCG/2ML IJ SOLN
INTRAMUSCULAR | Status: DC | PRN
  Administered 2020-02-25: 25 ug via INTRAVENOUS

## 2020-02-25 MED ORDER — FUROSEMIDE 10 MG/ML IJ SOLN
INTRAMUSCULAR | Status: DC | PRN
  Administered 2020-02-25: 20 mg via INTRAVENOUS

## 2020-02-25 MED ORDER — SODIUM CHLORIDE 0.9 % IV SOLN: 250.0000 mL | INTRAVENOUS | Status: DC | PRN

## 2020-02-25 MED ORDER — HEPARIN SODIUM (PORCINE) 1000 UNIT/ML IJ SOLN
INTRAMUSCULAR | Status: AC
  Filled 2020-02-25: qty 1

## 2020-02-25 MED ORDER — LIDOCAINE HCL (PF) 1 % IJ SOLN
INTRAMUSCULAR | Status: AC
  Filled 2020-02-25: qty 30

## 2020-02-25 MED ORDER — LABETALOL HCL 5 MG/ML IV SOLN: 10.0000 mg | INTRAVENOUS | Status: DC | PRN

## 2020-02-25 MED ORDER — SODIUM CHLORIDE 0.9 % IV SOLN: INTRAVENOUS | Status: DC

## 2020-02-25 MED ORDER — HEPARIN (PORCINE) IN NACL 1000-0.9 UT/500ML-% IV SOLN
INTRAVENOUS | Status: AC
  Filled 2020-02-25: qty 1000

## 2020-02-25 MED ORDER — MIDAZOLAM HCL 2 MG/2ML IJ SOLN
INTRAMUSCULAR | Status: AC
  Filled 2020-02-25: qty 2

## 2020-02-25 MED ORDER — FUROSEMIDE 10 MG/ML IJ SOLN
INTRAMUSCULAR | Status: AC
  Filled 2020-02-25: qty 4

## 2020-02-25 MED ORDER — MIDAZOLAM HCL 2 MG/2ML IJ SOLN
INTRAMUSCULAR | Status: DC | PRN
  Administered 2020-02-25: 1 mg via INTRAVENOUS

## 2020-02-25 MED ORDER — VERAPAMIL HCL 2.5 MG/ML IV SOLN
INTRAVENOUS | Status: DC | PRN
  Administered 2020-02-25: 10 mL via INTRA_ARTERIAL

## 2020-02-25 MED ORDER — HEPARIN (PORCINE) IN NACL 1000-0.9 UT/500ML-% IV SOLN
INTRAVENOUS | Status: DC | PRN
  Administered 2020-02-25 (×2): 500 mL

## 2020-02-25 MED ORDER — LIDOCAINE HCL (PF) 1 % IJ SOLN
INTRAMUSCULAR | Status: DC | PRN
  Administered 2020-02-25 (×2): 2 mL

## 2020-02-25 MED ORDER — ASPIRIN 81 MG PO CHEW: 81.0000 mg | CHEWABLE_TABLET | ORAL | Status: AC

## 2020-02-25 MED ORDER — IOHEXOL 350 MG/ML SOLN
INTRAVENOUS | Status: DC | PRN
  Administered 2020-02-25: 45 mL

## 2020-02-25 MED ORDER — FENTANYL CITRATE (PF) 100 MCG/2ML IJ SOLN
INTRAMUSCULAR | Status: AC
  Filled 2020-02-25: qty 2

## 2020-02-25 MED ORDER — ONDANSETRON HCL 4 MG/2ML IJ SOLN: 4.0000 mg | Freq: Four times a day (QID) | INTRAMUSCULAR | Status: DC | PRN

## 2020-02-25 MED ORDER — SODIUM CHLORIDE 0.9% FLUSH: 3.0000 mL | Freq: Two times a day (BID) | INTRAVENOUS | Status: DC

## 2020-02-25 MED ORDER — SODIUM CHLORIDE 0.9 % IV SOLN: INTRAVENOUS | Status: AC

## 2020-02-25 MED ORDER — VERAPAMIL HCL 2.5 MG/ML IV SOLN
INTRAVENOUS | Status: AC
  Filled 2020-02-25: qty 2

## 2020-02-25 MED ORDER — ACETAMINOPHEN 325 MG PO TABS: 650.0000 mg | ORAL_TABLET | ORAL | Status: DC | PRN

## 2020-02-25 MED ORDER — ASPIRIN 81 MG PO CHEW
CHEWABLE_TABLET | ORAL | Status: AC
  Filled 2020-02-25: qty 1

## 2020-02-25 MED ORDER — HEPARIN SODIUM (PORCINE) 1000 UNIT/ML IJ SOLN
INTRAMUSCULAR | Status: DC | PRN
  Administered 2020-02-25: 4000 [IU] via INTRAVENOUS

## 2020-02-25 SURGICAL SUPPLY — 12 items
CATH 5FR JL3.5 JR4 ANG PIG MP (CATHETERS) ×1 IMPLANT
CATH BALLN WEDGE 5F 110CM (CATHETERS) ×1 IMPLANT
DEVICE RAD COMP TR BAND LRG (VASCULAR PRODUCTS) ×2 IMPLANT
GLIDESHEATH SLEND SS 6F .021 (SHEATH) ×1 IMPLANT
GUIDEWIRE INQWIRE 1.5J.035X260 (WIRE) IMPLANT
INQWIRE 1.5J .035X260CM (WIRE) ×2
KIT ENCORE 26 ADVANTAGE (KITS) ×1 IMPLANT
KIT HEART LEFT (KITS) ×2 IMPLANT
PACK CARDIAC CATHETERIZATION (CUSTOM PROCEDURE TRAY) ×2 IMPLANT
SHEATH GLIDE SLENDER 4/5FR (SHEATH) ×2 IMPLANT
TRANSDUCER W/STOPCOCK (MISCELLANEOUS) ×2 IMPLANT
TUBING CIL FLEX 10 FLL-RA (TUBING) ×2 IMPLANT

## 2020-02-25 NOTE — Interval H&P Note (Signed)
History and Physical Interval Note:  02/25/2020 7:19 AM  Austin [name]  has presented today for surgery, with the diagnosis of cardiomyopathy.  The various methods of treatment have been discussed with the patient and family. After consideration of risks, benefits and other options for treatment, the patient has consented to  Procedure(s): RIGHT/LEFT HEART CATH AND CORONARY ANGIOGRAPHY (N/A) as a surgical intervention.  The patient's history has been reviewed, patient examined, no change in status, stable for surgery.  I have reviewed the patient's chart and labs.  Questions were answered to the patient's satisfaction.    Cath Lab Visit (complete for each Cath Lab visit)  Clinical Evaluation Leading to the Procedure:   ACS: No.  Non-ACS:    Anginal Classification: CCS II  Anti-ischemic medical therapy: No Therapy  Non-Invasive Test Results: No non-invasive testing performed  Prior CABG: No previous CABG        Lauree Chandler

## 2020-02-25 NOTE — Research (Signed)
[place] Informed Consent   Subject Name: Austin [name]  Subject met inclusion and exclusion criteria.  The informed consent form, study requirements and expectations were reviewed with the subject and questions and concerns were addressed prior to the signing of the consent form.  The subject verbalized understanding of the trail requirements.  The subject agreed to participate in the [facility] trial and signed the informed consent.  The informed consent was obtained prior to performance of any protocol-specific procedures for the subject.  A copy of the signed informed consent was given to the subject and a copy was placed in the subject's medical record.  Philemon Kingdom D 02/25/2020, 0700am

## 2020-02-25 NOTE — Discharge Instructions (Signed)
Increase Lasix to 40 mg po BID for 3 days then return to 40 mg daily  Radial Site Care  This sheet gives you information about how to care for yourself after your procedure. Your health care provider may also give you more specific instructions. If you have problems or questions, contact your health care provider. What can I expect after the procedure? After the procedure, it is common to have:  Bruising and tenderness at the catheter insertion area. Follow these instructions at home: Medicines  Take over-the-counter and prescription medicines only as told by your health care provider. Insertion site care  Follow instructions from your health care provider about how to take care of your insertion site. Make sure you: ? Wash your hands with soap and water before you change your bandage (dressing). If soap and water are not available, use hand sanitizer. ? Change your dressing as told by your health care provider. ? Leave stitches (sutures), skin glue, or adhesive strips in place. These skin closures may need to stay in place for 2 weeks or longer. If adhesive strip edges start to loosen and curl up, you may trim the loose edges. Do not remove adhesive strips completely unless your health care provider tells you to do that.  Check your insertion site every day for signs of infection. Check for: ? Redness, swelling, or pain. ? Fluid or blood. ? Pus or a bad smell. ? Warmth.  Do not take baths, swim, or use a hot tub until your health care provider approves.  You may shower 24-48 hours after the procedure, or as directed by your health care provider. ? Remove the dressing and gently wash the site with plain soap and water. ? Pat the area dry with a clean towel. ? Do not rub the site. That could cause bleeding.  Do not apply powder or lotion to the site. Activity   For 24 hours after the procedure, or as directed by your health care provider: ? Do not flex or bend the affected  arm. ? Do not push or pull heavy objects with the affected arm. ? Do not drive yourself home from the hospital or clinic. You may drive 24 hours after the procedure unless your health care provider tells you not to. ? Do not operate machinery or power tools.  Do not lift anything that is heavier than 10 lb (4.5 kg), or the limit that you are told, until your health care provider says that it is safe.  Ask your health care provider when it is okay to: ? Return to work or school. ? Resume usual physical activities or sports. ? Resume sexual activity. General instructions  If the catheter site starts to bleed, raise your arm and put firm pressure on the site. If the bleeding does not stop, get help right away. This is a medical emergency.  If you went home on the same day as your procedure, a responsible adult should be with you for the first 24 hours after you arrive home.  Keep all follow-up visits as told by your health care provider. This is important. Contact a health care provider if:  You have a fever.  You have redness, swelling, or yellow drainage around your insertion site. Get help right away if:  You have unusual pain at the radial site.  The catheter insertion area swells very fast.  The insertion area is bleeding, and the bleeding does not stop when you hold steady pressure on the area.  Your arm or hand becomes pale, cool, tingly, or numb. These symptoms may represent a serious problem that is an emergency. Do not wait to see if the symptoms will go away. Get medical help right away. Call your local emergency services (911 in the [place] Do not drive yourself to the hospital. Summary  After the procedure, it is common to have bruising and tenderness at the site.  Follow instructions from your health care provider about how to take care of your radial site wound. Check the wound every day for signs of infection.  Do not lift anything that is heavier than 10 lb (4.5  kg), or the limit that you are told, until your health care provider says that it is safe. This information is not intended to replace advice given to you by your health care provider. Make sure you discuss any questions you have with your health care provider. Document Revised: 12/31/2017 Document Reviewed: 12/31/2017 Elsevier Patient Education  2020 [place] After These instructions provide you with information about caring for yourself after your procedure. Your health care provider may also give you more specific instructions. Your treatment has been planned according to current medical practices, but problems sometimes occur. Call your health care provider if you have any problems or questions after your procedure. What can I expect after the procedure? After your procedure, you may:  Feel sleepy for several hours.  Feel clumsy and have poor balance for several hours.  Feel forgetful about what happened after the procedure.  Have poor judgment for several hours.  Feel nauseous or vomit.  Have a sore throat if you had a breathing tube during the procedure. Follow these instructions at home: For at least 24 hours after the procedure:      Have a responsible adult stay with you. It is important to have someone help care for you until you are awake and alert.  Rest as needed.  Do not: ? Participate in activities in which you could fall or become injured. ? Drive. ? Use heavy machinery. ? Drink alcohol. ? Take sleeping pills or medicines that cause drowsiness. ? Make important decisions or sign legal documents. ? Take care of children on your own. Eating and drinking  Follow the diet that is recommended by your health care provider.  If you vomit, drink water, juice, or soup when you can drink without vomiting.  Make sure you have little or no nausea before eating solid foods. General instructions  Take over-the-counter and  prescription medicines only as told by your health care provider.  If you have sleep apnea, surgery and certain medicines can increase your risk for breathing problems. Follow instructions from your health care provider about wearing your sleep device: ? Anytime you are sleeping, including during daytime naps. ? While taking prescription pain medicines, sleeping medicines, or medicines that make you drowsy.  If you smoke, do not smoke without supervision.  Keep all follow-up visits as told by your health care provider. This is important. Contact a health care provider if:  You keep feeling nauseous or you keep vomiting.  You feel light-headed.  You develop a rash.  You have a fever. Get help right away if:  You have trouble breathing. Summary  For several hours after your procedure, you may feel sleepy and have poor judgment.  Have a responsible adult stay with you for at least 24 hours or until you are awake and alert. This information is not  intended to replace advice given to you by your health care provider. Make sure you discuss any questions you have with your health care provider. Document Revised: 02/23/2018 Document Reviewed: 03/17/2016 Elsevier Patient Education  [place].

## 2020-02-28 NOTE — Progress Notes (Addendum)
Cardiology Office Note:    Date:  03/07/2020   ID:  SHERI HOTTINGER, DOB 1956-05-18, MRN [id number]  PCP:  [name], MD  Cardiologist:  No primary care provider on file.  Electrophysiologist:  None   Referring MD: [name], MD   Chief Complaint  Patient presents with  . Congestive Heart Failure    History of Present Illness:    MOSHEH EYMARD is a 64 y.o. male with a hx of prostate cancer, hypertension, alcohol use who presents for follow-up.  He was referred by [name], FNP for evaluation of newly diagnosed systolic heart failure, initially seen on 02/22/2020.  He presented to urgent care on 01/28/2020 with nausea, vomiting, diarrhea, cough, and dyspnea on exertion.  Chest x-ray showed bilateral small pleural effusions.  BNP was elevated.  Discharged on Lasix 40 mg daily x5 days.  TTE on 02/16/2020 showed EF less than 20%, global hypokinesis, severe LV dilatation, grade 3 diastolic dysfunction, severe RV dysfunction, severe left atrial dilatation, severe right atrial dilatation, mild mitral regurgitation, mild to moderate tricuspid regurgitation.  He was started on Entresto on 3/11.  Denies any lightheadedness, dizziness, syncope.  Reports dyspnea has improved with Lasix, currently on 20 mg daily.  He denies any chest pain but does have dyspnea on exertion.  No smoking history.  Reports he would drink about 5 beers/day, now drinking about 1 beer per day.  Brother had stent in 7s  At initial clinic visit on 02/22/2020, he was hypotensive to 82/64.  He had recently been started on Entresto, this was discontinued.  He underwent LHC/RHC on 02/25/2020, which showed no coronary artery disease, but elevated filling pressures (RA 15, RV 49/8, PA 51/24/35, PW 19, LVEDP 28, CI 4.0).  He was given a dose of IV Lasix in the Cath Lab and instructed to take Lasix 40 mg p.o. twice daily for 3 days then return to 40 mg daily.  Has not been weighing himself.  Denies any dyspnea at rest. Can walk  up 1 flight of stairs without stopping.     Past Medical History:  Diagnosis Date  . Arthritis    hands  . GERD (gastroesophageal reflux disease)   . Hypertension   . Prostate cancer [facility])     Past Surgical History:  Procedure Laterality Date  . LYMPHADENECTOMY Bilateral 01/26/2014   Procedure: LYMPHADENECTOMY WITH INDOCYANINE GREEN DYE;  Surgeon: Alexis Frock, MD;  Location: [facility];  Service: Urology;  Laterality: Bilateral;  . RIGHT/LEFT HEART CATH AND CORONARY ANGIOGRAPHY N/A 02/25/2020   Procedure: RIGHT/LEFT HEART CATH AND CORONARY ANGIOGRAPHY;  Surgeon: Burnell Blanks, MD;  Location: [facility];  Service: Cardiovascular;  Laterality: N/A;  . ROBOT ASSISTED LAPAROSCOPIC RADICAL PROSTATECTOMY N/A 01/26/2014   Procedure: ROBOTIC ASSISTED LAPAROSCOPIC RADICAL PROSTATECTOMY;  Surgeon: Alexis Frock, MD;  Location: [facility];  Service: Urology;  Laterality: N/A;  . SHOULDER SURGERY Left     Current Medications: Current Meds  Medication Sig  . furosemide (LASIX) 40 MG tablet Take 1 tablet (40 mg total) by mouth 2 (two) times daily.  . Multiple Vitamin (MULTIVITAMIN) tablet Take 1 tablet by mouth daily.  . [DISCONTINUED] furosemide (LASIX) 20 MG tablet Take 1 tablet (20 mg total) by mouth daily. (Patient taking differently: Take 40 mg by mouth daily. )     Allergies:   Latex   Social History   Socioeconomic History  . Marital status: Married    Spouse name: Not on file  . Number  of children: Not on file  . Years of education: Not on file  . Highest education level: Not on file  Occupational History  . Not on file  Tobacco Use  . Smoking status: Never Smoker  . Smokeless tobacco: Never Used  Substance and Sexual Activity  . Alcohol use: Yes    Comment: 3 beers daily  . Drug use: No  . Sexual activity: Not on file  Other Topics Concern  . Not on file  Social History Narrative  . Not on file   Social Determinants of Health   Financial Resource  Strain:   . Difficulty of Paying Living Expenses:   Food Insecurity:   . Worried About Charity fundraiser in the Last Year:   . Arboriculturist in the Last Year:   Transportation Needs:   . Film/video editor (Medical):   Marland Kitchen Lack of Transportation (Non-Medical):   Physical Activity:   . Days of Exercise per Week:   . Minutes of Exercise per Session:   Stress:   . Feeling of Stress :   Social Connections:   . Frequency of Communication with Friends and Family:   . Frequency of Social Gatherings with Friends and Family:   . Attends Religious Services:   . Active Member of Clubs or Organizations:   . Attends Archivist Meetings:   Marland Kitchen Marital Status:      Family History: The patient's family history includes Hypertension in his father and mother.  ROS:   Please see the history of present illness.     All other systems reviewed and are negative.  EKGs/Labs/Other Studies Reviewed:    The following studies were reviewed today:   EKG:  EKG is  ordered today.  The ekg ordered today demonstrates normal sinus rhythm, rate 113, poor R wave progression, QTC 477  TTE 02/16/20: 1. Left ventricular ejection fraction, by estimation, is <20%. The left  ventricle has severely decreased function. The left ventricle demonstrates  global hypokinesis. The left ventricular internal cavity size was severely  dilated. Left ventricular  diastolic parameters are consistent with Grade III diastolic dysfunction  (restrictive). There is the interventricular septum is flattened in  diastole ('D' shaped left ventricle), consistent with right ventricular  volume overload.  2. Right ventricular systolic function is severely reduced. The right  ventricular size is severely enlarged. There is normal pulmonary artery  systolic pressure.  3. Left atrial size was severely dilated.  4. Right atrial size was severely dilated.  5. The mitral valve is normal in structure. Mild mitral valve   regurgitation. No evidence of mitral stenosis.  6. Tricuspid valve regurgitation is mild to moderate.  7. The aortic valve is normal in structure. Aortic valve regurgitation is  not visualized. No aortic stenosis is present.    Recent Labs: 02/16/2020: BNP 583.9 02/22/2020: Platelets 217 02/25/2020: Hemoglobin 10.9 03/03/2020: BUN 14; Creatinine, Ser 1.02; Magnesium 1.5; Potassium 3.9; Sodium 131  Recent Lipid Panel    Component Value Date/Time   CHOL 125 02/22/2020 1034   TRIG 103 02/22/2020 1034   HDL 78 02/22/2020 1034   CHOLHDL 1.6 02/22/2020 1034   LDLCALC 28 02/22/2020 1034    Physical Exam:    VS:  BP (!) 140/96   Pulse (!) 113   Temp (!) 97 F (36.1 C)   Ht 5' 9.5" (1.765 m)   Wt 164 lb 3.2 oz (74.5 kg)   SpO2 96%   BMI 23.90 kg/m  Wt Readings from Last 3 Encounters:  03/03/20 164 lb 3.2 oz (74.5 kg)  02/25/20 165 lb (74.8 kg)  02/22/20 164 lb (74.4 kg)     GEN: in no acute distress HEENT: Normal NECK: + JVD LYMPHATICS: No lymphadenopathy CARDIAC: RRR, no murmurs, rubs, gallops RESPIRATORY:  Clear to auscultation without rales, wheezing or rhonchi  ABDOMEN: Soft, non-tender, non-distended MUSCULOSKELETAL:  1+ BLE edema SKIN: Warm and dry NEUROLOGIC:  Alert and oriented x 3 PSYCHIATRIC:  Normal affect   ASSESSMENT:    1. Acute combined systolic and diastolic heart failure ([place])   2. NICM (nonischemic cardiomyopathy) ([place])   3. Medication management   4. Alcohol use    PLAN:    Acute combined systolic and diastolic heart failure: new diagnosis, TTE on 02/16/2020 showed EF less than 20%.  Nonischemic cardiomyopathy, normal coronary arteries on cath 02/25/2020.  Could be secondary to significant alcohol use -Hypotensive to 82/64 at initial clinic visit, Delene Loll was discontinued -BP improved with holding entresto. Will start losartan 25 mg daily -On Lasix 40 mg daily.  Appears volume overloaded, will increase Lasix to 40 mg twice daily.  Will check  BMP/magnesium -Cardiac MRI to evaluate NICM  Alcohol use: Was drinking at least 5 beers after work each night, has cut back to 1 drink per day.  Encouraged cessation  RTC in 2 weeks  Medication Adjustments/Labs and Tests Ordered: Current medicines are reviewed at length with the patient today.  Concerns regarding medicines are outlined above.  Orders Placed This Encounter  Procedures  . MR CARDIAC MORPHOLOGY W WO CONTRAST  . Basic metabolic panel  . Magnesium  . EKG 12-Lead   Meds ordered this encounter  Medications  . losartan (COZAAR) 25 MG tablet    Sig: Take 1 tablet (25 mg total) by mouth daily.    Dispense:  90 tablet    Refill:  3  . furosemide (LASIX) 40 MG tablet    Sig: Take 1 tablet (40 mg total) by mouth 2 (two) times daily.    Dispense:  180 tablet    Refill:  3    Patient Instructions  Medication Instructions:  INCREASE furosemide (Lasix) to 40 mg TWO times daily START Losartan 25 mg daily  *If you need a refill on your cardiac medications before your next appointment, please call your pharmacy*   Lab Work: BMET, Mag today  If you have labs (blood work) drawn today and your tests are completely normal, you will receive your results only by: Marland Kitchen MyChart Message (if you have MyChart) OR . A paper copy in the mail If you have any lab test that is abnormal or we need to change your treatment, we will call you to review the results.   Testing/Procedures: Your physician has requested that you have a cardiac MRI. Cardiac MRI uses a computer to create images of your heart as its beating, producing both still and moving pictures of your heart and major blood vessels. For further information please visit [email or web address] Please follow the instruction sheet given to you today for more information.   Follow-Up: At [facility], you and your health needs are our priority.  As part of our continuing mission to provide you with exceptional heart care, we have  created designated Provider Care Teams.  These Care Teams include your primary Cardiologist (physician) and Advanced Practice Providers (APPs -  Physician Assistants and Nurse Practitioners) who all work together to provide you with the care you need, when you need  it.  We recommend signing up for the patient portal called "MyChart".  Sign up information is provided on this After Visit Summary.  MyChart is used to connect with patients for Virtual Visits (Telemedicine).  Patients are able to view lab/test results, encounter notes, upcoming appointments, etc.  Non-urgent messages can be sent to your provider as well.   To learn more about what you can do with MyChart, go to [email or web address].    Your next appointment:   2 week(s)  The format for your next appointment:   In Person  Provider:   Oswaldo Milian, MD   Other Instructions Please weigh yourself daily and write it down. Call the office if you gain 3 lbs overnight or 5 lbs total in 1 week.        Signed, Donato Heinz, MD  03/07/2020 9:36 AM    [place]

## 2020-03-03 ENCOUNTER — Other Ambulatory Visit: Payer: Self-pay

## 2020-03-03 ENCOUNTER — Encounter: Payer: Self-pay | Admitting: Cardiology

## 2020-03-03 ENCOUNTER — Ambulatory Visit (INDEPENDENT_AMBULATORY_CARE_PROVIDER_SITE_OTHER): Payer: 59 | Admitting: Cardiology

## 2020-03-03 VITALS — BP 140/96 | HR 113 | Temp 97.0°F | Ht 69.5 in | Wt 164.2 lb

## 2020-03-03 DIAGNOSIS — F1090 Alcohol use, unspecified, uncomplicated: Secondary | ICD-10-CM

## 2020-03-03 DIAGNOSIS — Z79899 Other long term (current) drug therapy: Secondary | ICD-10-CM

## 2020-03-03 DIAGNOSIS — I5041 Acute combined systolic (congestive) and diastolic (congestive) heart failure: Secondary | ICD-10-CM | POA: Diagnosis not present

## 2020-03-03 DIAGNOSIS — Z7289 Other problems related to lifestyle: Secondary | ICD-10-CM

## 2020-03-03 DIAGNOSIS — I428 Other cardiomyopathies: Secondary | ICD-10-CM

## 2020-03-03 DIAGNOSIS — Z789 Other specified health status: Secondary | ICD-10-CM

## 2020-03-03 MED ORDER — LOSARTAN POTASSIUM 25 MG PO TABS: 25.0000 mg | ORAL_TABLET | Freq: Every day | ORAL | 3 refills | Status: DC

## 2020-03-03 MED ORDER — FUROSEMIDE 40 MG PO TABS: 40.0000 mg | ORAL_TABLET | Freq: Two times a day (BID) | ORAL | 3 refills | Status: DC

## 2020-03-03 NOTE — Patient Instructions (Signed)
Medication Instructions:  INCREASE furosemide (Lasix) to 40 mg TWO times daily START Losartan 25 mg daily  *If you need a refill on your cardiac medications before your next appointment, please call your pharmacy*   Lab Work: BMET, Mag today  If you have labs (blood work) drawn today and your tests are completely normal, you will receive your results only by: Marland Kitchen MyChart Message (if you have MyChart) OR . A paper copy in the mail If you have any lab test that is abnormal or we need to change your treatment, we will call you to review the results.   Testing/Procedures: Your physician has requested that you have a cardiac MRI. Cardiac MRI uses a computer to create images of your heart as its beating, producing both still and moving pictures of your heart and major blood vessels. For further information please visit [email or web address] Please follow the instruction sheet given to you today for more information.   Follow-Up: At [facility], you and your health needs are our priority.  As part of our continuing mission to provide you with exceptional heart care, we have created designated Provider Care Teams.  These Care Teams include your primary Cardiologist (physician) and Advanced Practice Providers (APPs -  Physician Assistants and Nurse Practitioners) who all work together to provide you with the care you need, when you need it.  We recommend signing up for the patient portal called "MyChart".  Sign up information is provided on this After Visit Summary.  MyChart is used to connect with patients for Virtual Visits (Telemedicine).  Patients are able to view lab/test results, encounter notes, upcoming appointments, etc.  Non-urgent messages can be sent to your provider as well.   To learn more about what you can do with MyChart, go to [email or web address].    Your next appointment:   2 week(s)  The format for your next appointment:   In Person  Provider:   Oswaldo Milian,  MD   Other Instructions Please weigh yourself daily and write it down. Call the office if you gain 3 lbs overnight or 5 lbs total in 1 week.

## 2020-03-04 ENCOUNTER — Other Ambulatory Visit: Payer: Self-pay | Admitting: Cardiology

## 2020-03-04 DIAGNOSIS — I428 Other cardiomyopathies: Secondary | ICD-10-CM

## 2020-03-04 LAB — BASIC METABOLIC PANEL
BUN/Creatinine Ratio: 14 (ref 10–24)
BUN: 14 mg/dL (ref 8–27)
CO2: 14 mmol/L — ABNORMAL LOW (ref 20–29)
Calcium: 9 mg/dL (ref 8.6–10.2)
Chloride: 95 mmol/L — ABNORMAL LOW (ref 96–106)
Creatinine, Ser: 1.02 mg/dL (ref 0.76–1.27)
GFR calc Af Amer: 89 mL/min/{1.73_m2} (ref >59)
GFR calc non Af Amer: 77 mL/min/{1.73_m2} (ref >59)
Glucose: 78 mg/dL (ref 65–99)
Potassium: 3.9 mmol/L (ref 3.5–5.2)
Sodium: 131 mmol/L — ABNORMAL LOW (ref 134–144)

## 2020-03-04 LAB — MAGNESIUM: Magnesium: 1.5 mg/dL — ABNORMAL LOW (ref 1.6–2.3)

## 2020-03-04 MED ORDER — POTASSIUM CHLORIDE CRYS ER 20 MEQ PO TBCR: 20.0000 meq | EXTENDED_RELEASE_TABLET | Freq: Every day | ORAL | 3 refills | Status: DC

## 2020-03-04 MED ORDER — MAGNESIUM OXIDE 400 MG PO CAPS: 400.0000 mg | ORAL_CAPSULE | Freq: Two times a day (BID) | ORAL | 3 refills | Status: DC

## 2020-03-09 ENCOUNTER — Other Ambulatory Visit: Payer: Self-pay

## 2020-03-09 DIAGNOSIS — I428 Other cardiomyopathies: Secondary | ICD-10-CM

## 2020-03-09 LAB — BASIC METABOLIC PANEL
BUN/Creatinine Ratio: 20 (ref 10–24)
BUN: 30 mg/dL — ABNORMAL HIGH (ref 8–27)
CO2: 21 mmol/L (ref 20–29)
Calcium: 9.2 mg/dL (ref 8.6–10.2)
Chloride: 87 mmol/L — ABNORMAL LOW (ref 96–106)
Creatinine, Ser: 1.48 mg/dL — ABNORMAL HIGH (ref 0.76–1.27)
GFR calc Af Amer: 57 mL/min/{1.73_m2} — ABNORMAL LOW (ref >59)
GFR calc non Af Amer: 49 mL/min/{1.73_m2} — ABNORMAL LOW (ref >59)
Glucose: 84 mg/dL (ref 65–99)
Potassium: 4.2 mmol/L (ref 3.5–5.2)
Sodium: 127 mmol/L — ABNORMAL LOW (ref 134–144)

## 2020-03-09 LAB — MAGNESIUM: Magnesium: 1.5 mg/dL — ABNORMAL LOW (ref 1.6–2.3)

## 2020-03-10 ENCOUNTER — Ambulatory Visit (INDEPENDENT_AMBULATORY_CARE_PROVIDER_SITE_OTHER): Payer: 59 | Admitting: Cardiology

## 2020-03-10 ENCOUNTER — Encounter: Payer: Self-pay | Admitting: Cardiology

## 2020-03-10 ENCOUNTER — Other Ambulatory Visit: Payer: Self-pay

## 2020-03-10 VITALS — BP 96/68 | HR 95 | Ht 69.5 in | Wt 159.4 lb

## 2020-03-10 DIAGNOSIS — I5041 Acute combined systolic (congestive) and diastolic (congestive) heart failure: Secondary | ICD-10-CM

## 2020-03-10 DIAGNOSIS — N179 Acute kidney failure, unspecified: Secondary | ICD-10-CM

## 2020-03-10 DIAGNOSIS — I428 Other cardiomyopathies: Secondary | ICD-10-CM | POA: Diagnosis not present

## 2020-03-10 MED ORDER — FUROSEMIDE 40 MG PO TABS: 40.0000 mg | ORAL_TABLET | Freq: Every day | ORAL | 3 refills | Status: DC

## 2020-03-10 NOTE — Patient Instructions (Addendum)
Medication Instructions:  Stop taking losartan   don't take  Lasix  Today  03/10/20 , or tomorrow 03/11/20   - Restart taking Lasix at 40 mg  On Sunday 03/11/20  Do not take potassium while not taking Lasix , but continue taking your Magnesium    *If you need a refill on your cardiac medications before your next appointment, please call your pharmacy*   Lab Work: [facility] Magnesium on Monday 03/13/20   Testing/Procedures: Not needed   Follow-Up: At [facility], you and your health needs are our priority.  As part of our continuing mission to provide you with exceptional heart care, we have created designated Provider Care Teams.  These Care Teams include your primary Cardiologist (physician) and Advanced Practice Providers (APPs -  Physician Assistants and Nurse Practitioners) who all work together to provide you with the care you need, when you need it.  We recommend signing up for the patient portal called "MyChart".  Sign up information is provided on this After Visit Summary.  MyChart is used to connect with patients for Virtual Visits (Telemedicine).  Patients are able to view lab/test results, encounter notes, upcoming appointments, etc.  Non-urgent messages can be sent to your provider as well.   To learn more about what you can do with MyChart, go to [email or web address].    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

## 2020-03-10 NOTE — Progress Notes (Signed)
Cardiology Office Note:    Date:  03/10/2020   ID:  MOROCCO EISENBEIS, DOB [birth year], MRN [id number]  PCP:  [name], MD  Cardiologist:  No primary care provider on file.  Electrophysiologist:  None   Referring MD: [name], MD   Chief Complaint  Patient presents with  . Congestive Heart Failure    History of Present Illness:    Austin [name] is a 64 y.o. male with a hx of prostate cancer, hypertension, alcohol use who presents for follow-up.  He was referred by [name], FNP for evaluation of newly diagnosed systolic heart failure, initially seen on 02/22/2020.  He presented to urgent care on 01/28/2020 with nausea, vomiting, diarrhea, cough, and dyspnea on exertion.  Chest x-ray showed bilateral small pleural effusions.  BNP was elevated.  Discharged on Lasix 40 mg daily x5 days.  TTE on 02/16/2020 showed EF less than 20%, global hypokinesis, severe LV dilatation, grade 3 diastolic dysfunction, severe RV dysfunction, severe left atrial dilatation, severe right atrial dilatation, mild mitral regurgitation, mild to moderate tricuspid regurgitation.  He was started on Entresto on 3/11 by his PCP and referred to cardiology.  Reports he would drink about 5 beers/day, now drinking about 1 beer per day.  Brother had stent in 42s  At initial clinic visit on 02/22/2020, he was hypotensive to 82/64.  He had recently been started on Entresto, this was discontinued.  He underwent LHC/RHC on 02/25/2020, which showed no coronary artery disease, but elevated filling pressures (RA 15, RV 49/8, PA 51/24/35, PW 19, LVEDP 28, CI 4.0).  He was given a dose of IV Lasix in the Cath Lab and instructed to take Lasix 40 mg p.o. twice daily for 3 days then return to 40 mg daily.  At clinic visit on  03/03/20, noted to be volume overloaded, lasix dose increased to 40 mg twice daily was also started on losartan 25 mg.  Labs on 03/09/2020 showed worsening renal function (creatinine 1.0->1.5) and worsening  hyponatremia (131 ->127).  Since last clinic visit, he reports that he is feeling well.  Has lost 5 pounds in last week.  Denies any dyspnea and reports his lower extremity edema is improved.   Wt Readings from Last 3 Encounters:  03/10/20 159 lb 6.4 oz (72.3 kg)  03/03/20 164 lb 3.2 oz (74.5 kg)  02/25/20 165 lb (74.8 kg)     Past Medical History:  Diagnosis Date  . Arthritis    hands  . GERD (gastroesophageal reflux disease)   . Hypertension   . Prostate cancer [facility])     Past Surgical History:  Procedure Laterality Date  . LYMPHADENECTOMY Bilateral 01/26/2014   Procedure: LYMPHADENECTOMY WITH INDOCYANINE GREEN DYE;  Surgeon: Alexis Frock, MD;  Location: [facility] ORS;  Service: Urology;  Laterality: Bilateral;  . RIGHT/LEFT HEART CATH AND CORONARY ANGIOGRAPHY N/A 02/25/2020   Procedure: RIGHT/LEFT HEART CATH AND CORONARY ANGIOGRAPHY;  Surgeon: Burnell Blanks, MD;  Location: [facility];  Service: Cardiovascular;  Laterality: N/A;  . ROBOT ASSISTED LAPAROSCOPIC RADICAL PROSTATECTOMY N/A 01/26/2014   Procedure: ROBOTIC ASSISTED LAPAROSCOPIC RADICAL PROSTATECTOMY;  Surgeon: Alexis Frock, MD;  Location: [facility];  Service: Urology;  Laterality: N/A;  . SHOULDER SURGERY Left     Current Medications: Current Meds  Medication Sig  . furosemide (LASIX) 40 MG tablet Take 1 tablet (40 mg total) by mouth daily.  . Magnesium Oxide 400 MG CAPS Take 1 capsule (400 mg total) by mouth 2 (two) times  daily.  . Multiple Vitamin (MULTIVITAMIN) tablet Take 1 tablet by mouth daily.  . potassium chloride SA (KLOR-CON M20) 20 MEQ tablet Take 1 tablet (20 mEq total) by mouth daily.  . [DISCONTINUED] furosemide (LASIX) 40 MG tablet Take 1 tablet (40 mg total) by mouth 2 (two) times daily.  . [DISCONTINUED] losartan (COZAAR) 25 MG tablet Take 1 tablet (25 mg total) by mouth daily.     Allergies:   Latex   Social History   Socioeconomic History  . Marital status: Married    Spouse  name: Not on file  . Number of children: Not on file  . Years of education: Not on file  . Highest education level: Not on file  Occupational History  . Not on file  Tobacco Use  . Smoking status: Never Smoker  . Smokeless tobacco: Never Used  Substance and Sexual Activity  . Alcohol use: Yes    Comment: 3 beers daily  . Drug use: No  . Sexual activity: Not on file  Other Topics Concern  . Not on file  Social History Narrative  . Not on file   Social Determinants of Health   Financial Resource Strain:   . Difficulty of Paying Living Expenses:   Food Insecurity:   . Worried About Charity fundraiser in the Last Year:   . Arboriculturist in the Last Year:   Transportation Needs:   . Film/video editor (Medical):   Marland Kitchen Lack of Transportation (Non-Medical):   Physical Activity:   . Days of Exercise per Week:   . Minutes of Exercise per Session:   Stress:   . Feeling of Stress :   Social Connections:   . Frequency of Communication with Friends and Family:   . Frequency of Social Gatherings with Friends and Family:   . Attends Religious Services:   . Active Member of Clubs or Organizations:   . Attends Archivist Meetings:   Marland Kitchen Marital Status:      Family History: The patient's family history includes Hypertension in his father and mother.  ROS:   Please see the history of present illness.     All other systems reviewed and are negative.  EKGs/Labs/Other Studies Reviewed:    The following studies were reviewed today:   EKG:  EKG is  ordered today.  The ekg ordered today demonstrates normal sinus rhythm, rate 113, poor R wave progression, QTC 477  TTE 02/16/20: 1. Left ventricular ejection fraction, by estimation, is <20%. The left  ventricle has severely decreased function. The left ventricle demonstrates  global hypokinesis. The left ventricular internal cavity size was severely  dilated. Left ventricular  diastolic parameters are consistent with  Grade III diastolic dysfunction  (restrictive). There is the interventricular septum is flattened in  diastole ('D' shaped left ventricle), consistent with right ventricular  volume overload.  2. Right ventricular systolic function is severely reduced. The right  ventricular size is severely enlarged. There is normal pulmonary artery  systolic pressure.  3. Left atrial size was severely dilated.  4. Right atrial size was severely dilated.  5. The mitral valve is normal in structure. Mild mitral valve  regurgitation. No evidence of mitral stenosis.  6. Tricuspid valve regurgitation is mild to moderate.  7. The aortic valve is normal in structure. Aortic valve regurgitation is  not visualized. No aortic stenosis is present.    Recent Labs: 02/16/2020: BNP 583.9 02/22/2020: Platelets 217 02/25/2020: Hemoglobin 10.9 03/09/2020: BUN 30;  Creatinine, Ser 1.48; Magnesium 1.5; Potassium 4.2; Sodium 127  Recent Lipid Panel    Component Value Date/Time   CHOL 125 02/22/2020 1034   TRIG 103 02/22/2020 1034   HDL 78 02/22/2020 1034   CHOLHDL 1.6 02/22/2020 1034   LDLCALC 28 02/22/2020 1034    Physical Exam:    VS:  BP 96/68   Pulse 95   Ht 5' 9.5" (1.765 m)   Wt 159 lb 6.4 oz (72.3 kg)   SpO2 99%   BMI 23.20 kg/m     Wt Readings from Last 3 Encounters:  03/10/20 159 lb 6.4 oz (72.3 kg)  03/03/20 164 lb 3.2 oz (74.5 kg)  02/25/20 165 lb (74.8 kg)     GEN: in no acute distress HEENT: Normal NECK: no JVD LYMPHATICS: No lymphadenopathy CARDIAC: RRR, no murmurs, rubs, gallops RESPIRATORY:  Clear to auscultation without rales, wheezing or rhonchi  ABDOMEN: Soft, non-tender, non-distended MUSCULOSKELETAL:  trace BLE edema SKIN: Warm and dry NEUROLOGIC:  Alert and oriented x 3 PSYCHIATRIC:  Normal affect   ASSESSMENT:    1. Acute combined systolic (congestive) and diastolic (congestive) heart failure ([place])   2. NICM (nonischemic cardiomyopathy) ([place])   3. AKI (acute kidney  injury) ([place])   4. Hypomagnesemia    PLAN:    Acute combined systolic and diastolic heart failure: new diagnosis, TTE on 02/16/2020 showed EF less than 20%.  Nonischemic cardiomyopathy, normal coronary arteries on cath 02/25/2020.  Could be secondary to significant alcohol use -Hypotensive to SBP 80s at initial clinic visit, Entresto was discontinued. -Developed AKI with starting losartan, will hold -On Lasix 40 mg twice daily.  Suspect bump in creatinine due to overdiuresis and starting losartan, will discontinue losartan and hold Lasix for 2 days.  Asked patient to monitor weights over next 2 days and plan to restart Lasix 40 mg once daily on Sunday 4/4.  We will plan to recheck BMP on Monday 4/5 -Cardiac MRI ordered to evaluate NICM -Given his severe systolic dysfunction, and difficulty with tolerating GDMT due to hypotension/AKI as above, will refer to Advanced Heart Failure for evaluation  AKI: Creatinine bumped from 1.0-1.5.  Likely due to overdiuresis, will hold Lasix x2 days and then restart at 40 mg daily as above  Hypomagnesemia: Started on repletion  Alcohol use: Was drinking at least 5 beers after work each night, has cut back to 1 drink per day.  Encouraged cessation  RTC in 1 week  Medication Adjustments/Labs and Tests Ordered: Current medicines are reviewed at length with the patient today.  Concerns regarding medicines are outlined above.  Orders Placed This Encounter  Procedures  . Basic metabolic panel  . Magnesium  . AMB referral to CHF clinic   Meds ordered this encounter  Medications  . furosemide (LASIX) 40 MG tablet    Sig: Take 1 tablet (40 mg total) by mouth daily.    Dispense:  180 tablet    Refill:  3    Patient Instructions  Medication Instructions:  Stop taking losartan   don't take  Lasix  Today  03/10/20 , or tomorrow 03/11/20   - Restart taking Lasix at 40 mg  On Sunday 03/11/20  Do not take potassium while not taking Lasix , but continue taking your  Magnesium    *If you need a refill on your cardiac medications before your next appointment, please call your pharmacy*   Lab Work: [facility] Magnesium on Monday 03/13/20   Testing/Procedures: Not needed   Follow-Up:  At [facility], you and your health needs are our priority.  As part of our continuing mission to provide you with exceptional heart care, we have created designated Provider Care Teams.  These Care Teams include your primary Cardiologist (physician) and Advanced Practice Providers (APPs -  Physician Assistants and Nurse Practitioners) who all work together to provide you with the care you need, when you need it.  We recommend signing up for the patient portal called "MyChart".  Sign up information is provided on this After Visit Summary.  MyChart is used to connect with patients for Virtual Visits (Telemedicine).  Patients are able to view lab/test results, encounter notes, upcoming appointments, etc.  Non-urgent messages can be sent to your provider as well.   To learn more about what you can do with MyChart, go to [email or web address].    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

## 2020-03-14 LAB — MAGNESIUM: Magnesium: 1.6 mg/dL (ref 1.6–2.3)

## 2020-03-14 LAB — BASIC METABOLIC PANEL
BUN/Creatinine Ratio: 20 (ref 10–24)
BUN: 20 mg/dL (ref 8–27)
CO2: 22 mmol/L (ref 20–29)
Calcium: 9.1 mg/dL (ref 8.6–10.2)
Chloride: 91 mmol/L — ABNORMAL LOW (ref 96–106)
Creatinine, Ser: 1.02 mg/dL (ref 0.76–1.27)
GFR calc Af Amer: 89 mL/min/{1.73_m2} (ref >59)
GFR calc non Af Amer: 77 mL/min/{1.73_m2} (ref >59)
Glucose: 91 mg/dL (ref 65–99)
Potassium: 4.3 mmol/L (ref 3.5–5.2)
Sodium: 129 mmol/L — ABNORMAL LOW (ref 134–144)

## 2020-03-16 ENCOUNTER — Telehealth: Payer: Self-pay | Admitting: Cardiology

## 2020-03-16 ENCOUNTER — Encounter: Payer: Self-pay | Admitting: Cardiology

## 2020-03-16 NOTE — Telephone Encounter (Signed)
Spoke with Mrs. Lovena Le concerning appointment for Cardiac MRI scheduled Wednesday 04/12/20 at 11:00 am---arrival time is 10:15 am 1st floor radiology---will mail information to patient.

## 2020-03-16 NOTE — Progress Notes (Signed)
Cardiology Office Note:    Date:  03/17/2020   ID:  Austin [name], DOB 1956/08/12, MRN [id number]  PCP:  [name], MD  Cardiologist:  No primary care provider on file.  Electrophysiologist:  None   Referring MD: [name], MD   Chief Complaint  Patient presents with  . Congestive Heart Failure    History of Present Illness:    Austin [name] is a 64 y.o. male with a hx of prostate cancer, hypertension, alcohol use who presents for follow-up.  He was referred by [name], FNP for evaluation of newly diagnosed systolic heart failure, initially seen on 02/22/2020.  He presented to urgent care on 01/28/2020 with nausea, vomiting, diarrhea, cough, and dyspnea on exertion.  Chest x-ray showed bilateral small pleural effusions.  BNP was elevated.  Discharged on Lasix 40 mg daily x5 days.  TTE on 02/16/2020 showed EF less than 20%, global hypokinesis, severe LV dilatation, grade 3 diastolic dysfunction, severe RV dysfunction, severe left atrial dilatation, severe right atrial dilatation, mild mitral regurgitation, mild to moderate tricuspid regurgitation.  He was started on Entresto on 3/11 by his PCP and referred to cardiology.  Reports he would drink about 5 beers/day, now drinking about 1 beer per day.  Brother had stent in 68s  At initial clinic visit on 02/22/2020, he was hypotensive to 82/64.  He had recently been started on Entresto, this was discontinued.  He underwent LHC/RHC on 02/25/2020, which showed no coronary artery disease, but elevated filling pressures (RA 15, RV 49/8, PA 51/24/35, PW 19, LVEDP 28, CI 4.0).  He was given a dose of IV Lasix in the Cath Lab and instructed to take Lasix 40 mg p.o. twice daily for 3 days then return to 40 mg daily.  At clinic visit on  03/03/20, noted to be volume overloaded, lasix dose increased to 40 mg twice daily was also started on losartan 25 mg.  Labs on 03/09/2020 showed worsening renal function (creatinine 1.0->1.5) and worsening  hyponatremia (131 ->127).  Losartan was discontinued and Lasix was held for 2 days and restarted at 40 mg daily.  Repeat labs 03/13/2020 showed creatinine had normalized (1.0).  Since last clinic visit, reports that he has been doing well.  States that he cut grass last week with pushmower.  Reports some fatigue but otherwise has no complaints.  Weight has been stable.  Currently drinking 3 beers per week.   Wt Readings from Last 3 Encounters:  03/17/20 162 lb 3.2 oz (73.6 kg)  03/10/20 159 lb 6.4 oz (72.3 kg)  03/03/20 164 lb 3.2 oz (74.5 kg)     Past Medical History:  Diagnosis Date  . Arthritis    hands  . GERD (gastroesophageal reflux disease)   . Hypertension   . Prostate cancer [facility])     Past Surgical History:  Procedure Laterality Date  . LYMPHADENECTOMY Bilateral 01/26/2014   Procedure: LYMPHADENECTOMY WITH INDOCYANINE GREEN DYE;  Surgeon: Alexis Frock, MD;  Location: [facility];  Service: Urology;  Laterality: Bilateral;  . RIGHT/LEFT HEART CATH AND CORONARY ANGIOGRAPHY N/A 02/25/2020   Procedure: RIGHT/LEFT HEART CATH AND CORONARY ANGIOGRAPHY;  Surgeon: Burnell Blanks, MD;  Location: [facility];  Service: Cardiovascular;  Laterality: N/A;  . ROBOT ASSISTED LAPAROSCOPIC RADICAL PROSTATECTOMY N/A 01/26/2014   Procedure: ROBOTIC ASSISTED LAPAROSCOPIC RADICAL PROSTATECTOMY;  Surgeon: Alexis Frock, MD;  Location: [facility];  Service: Urology;  Laterality: N/A;  . SHOULDER SURGERY Left     Current Medications:  Current Meds  Medication Sig  . furosemide (LASIX) 40 MG tablet Take 1 tablet (40 mg total) by mouth daily.  . Magnesium Oxide 400 MG CAPS Take 1 capsule (400 mg total) by mouth 2 (two) times daily.  . Multiple Vitamin (MULTIVITAMIN) tablet Take 1 tablet by mouth daily.  . potassium chloride SA (KLOR-CON M20) 20 MEQ tablet Take 1 tablet (20 mEq total) by mouth daily.     Allergies:   Latex   Social History   Socioeconomic History  . Marital status:  Married    Spouse name: Not on file  . Number of children: Not on file  . Years of education: Not on file  . Highest education level: Not on file  Occupational History  . Not on file  Tobacco Use  . Smoking status: Never Smoker  . Smokeless tobacco: Never Used  Substance and Sexual Activity  . Alcohol use: Yes    Comment: 3 beers daily  . Drug use: No  . Sexual activity: Not on file  Other Topics Concern  . Not on file  Social History Narrative  . Not on file   Social Determinants of Health   Financial Resource Strain:   . Difficulty of Paying Living Expenses:   Food Insecurity:   . Worried About Charity fundraiser in the Last Year:   . Arboriculturist in the Last Year:   Transportation Needs:   . Film/video editor (Medical):   Marland Kitchen Lack of Transportation (Non-Medical):   Physical Activity:   . Days of Exercise per Week:   . Minutes of Exercise per Session:   Stress:   . Feeling of Stress :   Social Connections:   . Frequency of Communication with Friends and Family:   . Frequency of Social Gatherings with Friends and Family:   . Attends Religious Services:   . Active Member of Clubs or Organizations:   . Attends Archivist Meetings:   Marland Kitchen Marital Status:      Family History: The patient's family history includes Hypertension in his father and mother.  ROS:   Please see the history of present illness.     All other systems reviewed and are negative.  EKGs/Labs/Other Studies Reviewed:    The following studies were reviewed today:   EKG:  EKG is  ordered today.  The ekg ordered today demonstrates normal sinus rhythm, rate 113, poor R wave progression, QTC 477  TTE 02/16/20: 1. Left ventricular ejection fraction, by estimation, is <20%. The left  ventricle has severely decreased function. The left ventricle demonstrates  global hypokinesis. The left ventricular internal cavity size was severely  dilated. Left ventricular  diastolic parameters are  consistent with Grade III diastolic dysfunction  (restrictive). There is the interventricular septum is flattened in  diastole ('D' shaped left ventricle), consistent with right ventricular  volume overload.  2. Right ventricular systolic function is severely reduced. The right  ventricular size is severely enlarged. There is normal pulmonary artery  systolic pressure.  3. Left atrial size was severely dilated.  4. Right atrial size was severely dilated.  5. The mitral valve is normal in structure. Mild mitral valve  regurgitation. No evidence of mitral stenosis.  6. Tricuspid valve regurgitation is mild to moderate.  7. The aortic valve is normal in structure. Aortic valve regurgitation is  not visualized. No aortic stenosis is present.    Recent Labs: 02/16/2020: BNP 583.9 02/22/2020: Platelets 217 02/25/2020: Hemoglobin 10.9 03/13/2020:  BUN 20; Creatinine, Ser 1.02; Magnesium 1.6; Potassium 4.3; Sodium 129  Recent Lipid Panel    Component Value Date/Time   CHOL 125 02/22/2020 1034   TRIG 103 02/22/2020 1034   HDL 78 02/22/2020 1034   CHOLHDL 1.6 02/22/2020 1034   LDLCALC 28 02/22/2020 1034    Physical Exam:    VS:  BP 125/74   Pulse (!) 101   Temp (!) 96.9 F (36.1 C)   Ht 5\' 10"  (1.778 m)   Wt 162 lb 3.2 oz (73.6 kg)   SpO2 96%   BMI 23.27 kg/m     Wt Readings from Last 3 Encounters:  03/17/20 162 lb 3.2 oz (73.6 kg)  03/10/20 159 lb 6.4 oz (72.3 kg)  03/03/20 164 lb 3.2 oz (74.5 kg)     GEN: in no acute distress HEENT: Normal NECK: JVD LYMPHATICS: No lymphadenopathy CARDIAC: RRR, no murmurs, rubs, gallops RESPIRATORY:  Clear to auscultation without rales, wheezing or rhonchi  ABDOMEN: Soft, non-tender, non-distended MUSCULOSKELETAL:  trace BLE edema SKIN: Warm and dry NEUROLOGIC:  Alert and oriented x 3 PSYCHIATRIC:  Normal affect   ASSESSMENT:    1. Acute combined systolic and diastolic heart failure ([place])   2. NICM (nonischemic cardiomyopathy)  ([place])   3. AKI (acute kidney injury) ([place])   4. Alcohol use   5. Hypomagnesemia    PLAN:    Acute combined systolic and diastolic heart failure: new diagnosis, TTE on 02/16/2020 showed EF less than 20%.  Nonischemic cardiomyopathy, normal coronary arteries on cath 02/25/2020.  Could be secondary to significant alcohol use -Hypotensive to SBP 80s at initial clinic visit, Entresto was discontinued.  Developed AKI with starting losartan, holding -Continue Lasix 40 mg daily -Start Toprol-XL 12.5 mg daily -Cardiac MRI ordered to evaluate NICM -Given his severe systolic dysfunction, and difficulty with tolerating GDMT due to hypotension/AKI as above, referred to Advanced Heart Failure for evaluation.  Has appointment with Dr. Aundra Dubin on 04/11/2020  AKI: Creatinine bumped from 1.0-1.5.  Likely due to overdiuresis, appears to have resolved with holding Lasix x2 days and restarting at 40 mg daily.  Will check BMET, magnesium  Hypomagnesemia: Continue magnesium oxide 40 mg twice daily  Alcohol use: Was drinking at least 5 beers after work each night, has cut back to 3 beers per week.  Encouraged cessation  RTC in 1 week  Medication Adjustments/Labs and Tests Ordered: Current medicines are reviewed at length with the patient today.  Concerns regarding medicines are outlined above.  No orders of the defined types were placed in this encounter.  Meds ordered this encounter  Medications  . metoprolol succinate (TOPROL XL) 25 MG 24 hr tablet    Sig: Take 0.5 tablets (12.5 mg total) by mouth daily.    Dispense:  45 tablet    Refill:  3    Patient Instructions  Medication Instructions:   Begin taking Metoprolol Succinate (Toprol XL), 12.5mg , half a tab, once daily  Labwork: None ordered.  Testing/Procedures: None ordered.  Follow-Up: Your physician recommends that you schedule a follow-up appointment   Friday April 16 @ 9AM   Any Other Special Instructions Will Be Listed Below (If  Applicable).   Eat a heart-healthy diet that is low in salt, saturated fat, and cholesterol. Your doctor may suggest foods that are high in fiber, such as: ? Fresh fruits and vegetables. ? Whole grains. ? Beans.  Do not use any products that contain nicotine or tobacco, such as cigarettes and e-cigarettes. If  you need help quitting, ask your doctor.   General instructions  Keep a record of your weight. ? Record your hospital or clinic weight. When you get home, compare it to your scale and record your weight. ? Weigh yourself first thing in the morning each day, and record the weights. You should weigh yourself every morning after you pee and before you eat breakfast. Wear the same amount of clothing each time you weigh yourself. ? Share your weight record with your doctor. These can help your doctor see if your body is holding extra fluid. ? Tell your doctor right away if you have gained weight quickly, or if you have gained weight as told by your doctor. Your medicines may need to be adjusted.  Check your blood pressure as often as told by your doctor. ? Buy a home blood pressure cuff at your drugstore. ? Record your blood pressure readings. Bring them with you for your clinic visits.  Stay at a healthy weight. Ask your doctor what weight is healthy for you.  Think about doing therapy or being a part of a support group.  Keep all follow-up visits as told by your doctor. This is important. Contact a doctor if:  You have questions about your medicines.  You miss a dose of your medicine. Get help right away if:  You have very bad chest pain, especially if the pain is crushing or pressure-like and spreads to the arms, back, neck, or jaw.  You have more swelling in your hands, feet, ankles, or belly (abdomen).  You feel sick to your stomach (nauseous).  You have strange sweating.  Your skin turns blue or pale.  Your shortness of breath gets worse.  You feel dizzy or  unsteady.  Your vision is blurry.  You have a headache.  You cough up bloody split.  You cannot sleep because it is hard to breathe.  You start to feel a "jumping" or "fluttering" sensation (palpitations) in the chest that is unusual for you.  You feel like you cannot get enough air.  You gain weight rapidly. These symptoms may be an emergency. Do not wait to see if the symptoms will go away. Get medical help right away. Call your local emergency services (911 in the [place] Do not drive yourself to the hospital.    If you need a refill on your cardiac medications before your next appointment, please call your pharmacy.     Signed, Donato Heinz, MD  03/17/2020 10:00 AM    [place]

## 2020-03-17 ENCOUNTER — Other Ambulatory Visit: Payer: Self-pay

## 2020-03-17 ENCOUNTER — Ambulatory Visit (INDEPENDENT_AMBULATORY_CARE_PROVIDER_SITE_OTHER): Payer: 59 | Admitting: Cardiology

## 2020-03-17 ENCOUNTER — Encounter: Payer: Self-pay | Admitting: Cardiology

## 2020-03-17 VITALS — BP 125/74 | HR 101 | Temp 96.9°F | Ht 70.0 in | Wt 162.2 lb

## 2020-03-17 DIAGNOSIS — F1090 Alcohol use, unspecified, uncomplicated: Secondary | ICD-10-CM

## 2020-03-17 DIAGNOSIS — N179 Acute kidney failure, unspecified: Secondary | ICD-10-CM

## 2020-03-17 DIAGNOSIS — I5041 Acute combined systolic (congestive) and diastolic (congestive) heart failure: Secondary | ICD-10-CM | POA: Diagnosis not present

## 2020-03-17 DIAGNOSIS — Z789 Other specified health status: Secondary | ICD-10-CM

## 2020-03-17 DIAGNOSIS — Z7289 Other problems related to lifestyle: Secondary | ICD-10-CM

## 2020-03-17 DIAGNOSIS — I428 Other cardiomyopathies: Secondary | ICD-10-CM

## 2020-03-17 MED ORDER — METOPROLOL SUCCINATE ER 25 MG PO TB24: 12.5000 mg | ORAL_TABLET | Freq: Every day | ORAL | 3 refills | Status: DC

## 2020-03-17 NOTE — Patient Instructions (Addendum)
Medication Instructions:   Begin taking Metoprolol Succinate (Toprol XL), 12.5mg , half a tab, once daily  Labwork: None ordered.  Testing/Procedures: None ordered.  Follow-Up: Your physician recommends that you schedule a follow-up appointment   Friday April 16 @ 9AM   Any Other Special Instructions Will Be Listed Below (If Applicable).   Eat a heart-healthy diet that is low in salt, saturated fat, and cholesterol. Your doctor may suggest foods that are high in fiber, such as: ? Fresh fruits and vegetables. ? Whole grains. ? Beans.  Do not use any products that contain nicotine or tobacco, such as cigarettes and e-cigarettes. If you need help quitting, ask your doctor.   General instructions  Keep a record of your weight. ? Record your hospital or clinic weight. When you get home, compare it to your scale and record your weight. ? Weigh yourself first thing in the morning each day, and record the weights. You should weigh yourself every morning after you pee and before you eat breakfast. Wear the same amount of clothing each time you weigh yourself. ? Share your weight record with your doctor. These can help your doctor see if your body is holding extra fluid. ? Tell your doctor right away if you have gained weight quickly, or if you have gained weight as told by your doctor. Your medicines may need to be adjusted.  Check your blood pressure as often as told by your doctor. ? Buy a home blood pressure cuff at your drugstore. ? Record your blood pressure readings. Bring them with you for your clinic visits.  Stay at a healthy weight. Ask your doctor what weight is healthy for you.  Think about doing therapy or being a part of a support group.  Keep all follow-up visits as told by your doctor. This is important. Contact a doctor if:  You have questions about your medicines.  You miss a dose of your medicine. Get help right away if:  You have very bad chest pain,  especially if the pain is crushing or pressure-like and spreads to the arms, back, neck, or jaw.  You have more swelling in your hands, feet, ankles, or belly (abdomen).  You feel sick to your stomach (nauseous).  You have strange sweating.  Your skin turns blue or pale.  Your shortness of breath gets worse.  You feel dizzy or unsteady.  Your vision is blurry.  You have a headache.  You cough up bloody split.  You cannot sleep because it is hard to breathe.  You start to feel a "jumping" or "fluttering" sensation (palpitations) in the chest that is unusual for you.  You feel like you cannot get enough air.  You gain weight rapidly. These symptoms may be an emergency. Do not wait to see if the symptoms will go away. Get medical help right away. Call your local emergency services (911 in the [place] Do not drive yourself to the hospital.    If you need a refill on your cardiac medications before your next appointment, please call your pharmacy.

## 2020-03-18 LAB — BASIC METABOLIC PANEL
BUN/Creatinine Ratio: 12 (ref 10–24)
BUN: 13 mg/dL (ref 8–27)
CO2: 21 mmol/L (ref 20–29)
Calcium: 9.5 mg/dL (ref 8.6–10.2)
Chloride: 95 mmol/L — ABNORMAL LOW (ref 96–106)
Creatinine, Ser: 1.11 mg/dL (ref 0.76–1.27)
GFR calc Af Amer: 81 mL/min/{1.73_m2} (ref >59)
GFR calc non Af Amer: 70 mL/min/{1.73_m2} (ref >59)
Glucose: 79 mg/dL (ref 65–99)
Potassium: 4.1 mmol/L (ref 3.5–5.2)
Sodium: 134 mmol/L (ref 134–144)

## 2020-03-18 LAB — MAGNESIUM: Magnesium: 1.5 mg/dL — ABNORMAL LOW (ref 1.6–2.3)

## 2020-03-23 NOTE — Progress Notes (Signed)
Cardiology Office Note:    Date:  03/25/2020   ID:  Austin [name], DOB [birth year], MRN [id number]  PCP:  [name], MD  Cardiologist:  No primary care provider on file.  Electrophysiologist:  None   Referring MD: [name], MD   Chief Complaint  Patient presents with  . Congestive Heart Failure    History of Present Illness:    Austin [name] is a 64 y.o. male with a hx of prostate cancer, hypertension, alcohol use who presents for follow-up.  He was referred by [name], FNP for evaluation of newly diagnosed systolic heart failure, initially seen on 02/22/2020.  He presented to urgent care on 01/28/2020 with nausea, vomiting, diarrhea, cough, and dyspnea on exertion.  Chest x-ray showed bilateral small pleural effusions.  BNP was elevated.  Discharged on Lasix 40 mg daily x5 days.  TTE on 02/16/2020 showed EF less than 20%, global hypokinesis, severe LV dilatation, grade 3 diastolic dysfunction, severe RV dysfunction, severe left atrial dilatation, severe right atrial dilatation, mild mitral regurgitation, mild to moderate tricuspid regurgitation.  He was started on Entresto on 3/11 by his PCP and referred to cardiology.  Reports he would drink about 5 beers/day.  Family history includes brother had stent in 68s.  At initial clinic visit on 02/22/2020, he was hypotensive to 82/64.  He had recently been started on Entresto, this was discontinued.  He underwent LHC/RHC on 02/25/2020, which showed no coronary artery disease, but elevated filling pressures (RA 15, RV 49/8, PA 51/24/35, PW 19, LVEDP 28, CI 4.0).  He was given a dose of IV Lasix in the Cath Lab and instructed to take Lasix 40 mg p.o. twice daily for 3 days then return to 40 mg daily.  At clinic visit on  03/03/20, noted to be volume overloaded, lasix dose increased to 40 mg twice daily was also started on losartan 25 mg.  Labs on 03/09/2020 showed worsening renal function (creatinine 1.0->1.5) and worsening hyponatremia  (131 ->127).  Losartan was discontinued and Lasix was held for 2 days and restarted at 40 mg daily.  Repeat labs 03/13/2020 showed creatinine had normalized (1.0).  Since last clinic visit, he reports that his dyspnea is stable.  He states that his weights have been stable at home, but he is up 5 pounds on our scales.  Drank 1 beer this past week.      Wt Readings from Last 3 Encounters:  03/24/20 167 lb (75.8 kg)  03/17/20 162 lb 3.2 oz (73.6 kg)  03/10/20 159 lb 6.4 oz (72.3 kg)     Past Medical History:  Diagnosis Date  . Arthritis    hands  . GERD (gastroesophageal reflux disease)   . Hypertension   . Prostate cancer [facility])     Past Surgical History:  Procedure Laterality Date  . LYMPHADENECTOMY Bilateral 01/26/2014   Procedure: LYMPHADENECTOMY WITH INDOCYANINE GREEN DYE;  Surgeon: Alexis Frock, MD;  Location: [facility];  Service: Urology;  Laterality: Bilateral;  . RIGHT/LEFT HEART CATH AND CORONARY ANGIOGRAPHY N/A 02/25/2020   Procedure: RIGHT/LEFT HEART CATH AND CORONARY ANGIOGRAPHY;  Surgeon: Burnell Blanks, MD;  Location: [place] CV LAB;  Service: Cardiovascular;  Laterality: N/A;  . ROBOT ASSISTED LAPAROSCOPIC RADICAL PROSTATECTOMY N/A 01/26/2014   Procedure: ROBOTIC ASSISTED LAPAROSCOPIC RADICAL PROSTATECTOMY;  Surgeon: Alexis Frock, MD;  Location: [facility];  Service: Urology;  Laterality: N/A;  . SHOULDER SURGERY Left     Current Medications: Current Meds  Medication Sig  .  furosemide (LASIX) 40 MG tablet Take 1 tablet (40 mg total) by mouth daily.  . Magnesium Oxide 400 MG CAPS Take 1 capsule (400 mg total) by mouth 2 (two) times daily.  . metoprolol succinate (TOPROL XL) 25 MG 24 hr tablet Take 0.5 tablets (12.5 mg total) by mouth daily.  . Multiple Vitamin (MULTIVITAMIN) tablet Take 1 tablet by mouth daily.  . potassium chloride SA (KLOR-CON M20) 20 MEQ tablet Take 1 tablet (20 mEq total) by mouth daily.     Allergies:   Latex   Social History    Socioeconomic History  . Marital status: Married    Spouse name: Not on file  . Number of children: Not on file  . Years of education: Not on file  . Highest education level: Not on file  Occupational History  . Not on file  Tobacco Use  . Smoking status: Never Smoker  . Smokeless tobacco: Never Used  Substance and Sexual Activity  . Alcohol use: Yes    Comment: 3 beers daily  . Drug use: No  . Sexual activity: Not on file  Other Topics Concern  . Not on file  Social History Narrative  . Not on file   Social Determinants of Health   Financial Resource Strain:   . Difficulty of Paying Living Expenses:   Food Insecurity:   . Worried About Charity fundraiser in the Last Year:   . Arboriculturist in the Last Year:   Transportation Needs:   . Film/video editor (Medical):   Marland Kitchen Lack of Transportation (Non-Medical):   Physical Activity:   . Days of Exercise per Week:   . Minutes of Exercise per Session:   Stress:   . Feeling of Stress :   Social Connections:   . Frequency of Communication with Friends and Family:   . Frequency of Social Gatherings with Friends and Family:   . Attends Religious Services:   . Active Member of Clubs or Organizations:   . Attends Archivist Meetings:   Marland Kitchen Marital Status:      Family History: The patient's family history includes Hypertension in his father and mother.  ROS:   Please see the history of present illness.     All other systems reviewed and are negative.  EKGs/Labs/Other Studies Reviewed:    The following studies were reviewed today:   EKG:  EKG is  ordered today.  The ekg ordered today demonstrates normal sinus rhythm, rate 113, poor R wave progression, QTC 477  TTE 02/16/20: 1. Left ventricular ejection fraction, by estimation, is <20%. The left  ventricle has severely decreased function. The left ventricle demonstrates  global hypokinesis. The left ventricular internal cavity size was severely   dilated. Left ventricular  diastolic parameters are consistent with Grade III diastolic dysfunction  (restrictive). There is the interventricular septum is flattened in  diastole ('D' shaped left ventricle), consistent with right ventricular  volume overload.  2. Right ventricular systolic function is severely reduced. The right  ventricular size is severely enlarged. There is normal pulmonary artery  systolic pressure.  3. Left atrial size was severely dilated.  4. Right atrial size was severely dilated.  5. The mitral valve is normal in structure. Mild mitral valve  regurgitation. No evidence of mitral stenosis.  6. Tricuspid valve regurgitation is mild to moderate.  7. The aortic valve is normal in structure. Aortic valve regurgitation is  not visualized. No aortic stenosis is present.  Recent Labs: 02/22/2020: Platelets 217 02/25/2020: Hemoglobin 10.9 03/24/2020: BNP 3,023.7; BUN 8; Creatinine, Ser 1.01; Magnesium 1.2; Potassium 4.3; Sodium 127  Recent Lipid Panel    Component Value Date/Time   CHOL 125 02/22/2020 1034   TRIG 103 02/22/2020 1034   HDL 78 02/22/2020 1034   CHOLHDL 1.6 02/22/2020 1034   LDLCALC 28 02/22/2020 1034    Physical Exam:    VS:  BP 130/82   Pulse 67   Temp (!) 97 F (36.1 C)   Ht 5\' 9"  (1.753 m)   Wt 167 lb (75.8 kg)   SpO2 97%   BMI 24.66 kg/m     Wt Readings from Last 3 Encounters:  03/24/20 167 lb (75.8 kg)  03/17/20 162 lb 3.2 oz (73.6 kg)  03/10/20 159 lb 6.4 oz (72.3 kg)     GEN: in no acute distress HEENT: Normal NECK: + JVD LYMPHATICS: No lymphadenopathy CARDIAC: RRR, no murmurs, rubs, gallops RESPIRATORY:  Clear to auscultation without rales, wheezing or rhonchi  ABDOMEN: Soft, non-tender, non-distended MUSCULOSKELETAL:  trace BLE edema SKIN: Warm and dry NEUROLOGIC:  Alert and oriented x 3 PSYCHIATRIC:  Normal affect   ASSESSMENT:    1. Acute combined systolic and diastolic heart failure ([place])   2.  Hypomagnesemia   3. Medication management    PLAN:    Acute combined systolic and diastolic heart failure: new diagnosis, TTE on 02/16/2020 showed EF less than 20%.  Nonischemic cardiomyopathy, normal coronary arteries on cath 02/25/2020.  Could be secondary to significant alcohol use -Hypotensive to SBP 80s at initial clinic visit, Entresto was discontinued.  Developed AKI with starting losartan, holding -Continue Lasix 40 mg daily -Continue Toprol-XL 12.5 mg daily -Reports stable weights at home, but up 5 pounds on our scales.  Lungs are CTAB and no edema, but he does have JVD suggesting extra volume.  Will check BMET, BNP.  If stable creatinine/potassium, will add spironolactone 12.5 mg daily to augment diuresis -Cardiac MRI ordered to evaluate NICM -Given his severe systolic dysfunction, and difficulty with tolerating GDMT due to hypotension/AKI as above, referred to Advanced Heart Failure for evaluation.  Has appointment with Dr. Aundra Dubin on 04/11/2020  Hypomagnesemia: Continue magnesium oxide 400 mg twice daily.  He reports he was only taking once a day, recommended taking twice daily given persistent hypomagnesemia.  Will recheck magnesium today  Alcohol use: Was drinking at least 5 beers after work each night, has cut back to 1 beer in last week.  Encouraged cessation  RTC in 1 week  Medication Adjustments/Labs and Tests Ordered: Current medicines are reviewed at length with the patient today.  Concerns regarding medicines are outlined above.  Orders Placed This Encounter  Procedures  . Basic metabolic panel  . Brain natriuretic peptide  . Magnesium   No orders of the defined types were placed in this encounter.   Patient Instructions  Medication Instructions:  Your physician recommends that you continue on your current medications as directed. Please refer to the Current Medication list given to you today.  *If you need a refill on your cardiac medications before your next  appointment, please call your pharmacy*   Lab Work: BMET, BNP, Mag today  If you have labs (blood work) drawn today and your tests are completely normal, you will receive your results only by: Marland Kitchen MyChart Message (if you have MyChart) OR . A paper copy in the mail If you have any lab test that is abnormal or we need to change  your treatment, we will call you to review the results.  Follow-Up: At [facility], you and your health needs are our priority.  As part of our continuing mission to provide you with exceptional heart care, we have created designated Provider Care Teams.  These Care Teams include your primary Cardiologist (physician) and Advanced Practice Providers (APPs -  Physician Assistants and Nurse Practitioners) who all work together to provide you with the care you need, when you need it.  We recommend signing up for the patient portal called "MyChart".  Sign up information is provided on this After Visit Summary.  MyChart is used to connect with patients for Virtual Visits (Telemedicine).  Patients are able to view lab/test results, encounter notes, upcoming appointments, etc.  Non-urgent messages can be sent to your provider as well.   To learn more about what you can do with MyChart, go to [email or web address].    Your next appointment:   2 week(s)  The format for your next appointment:   In Person  Provider:   Oswaldo Milian, MD       Signed, Donato Heinz, MD  03/25/2020 8:39 PM    [place]

## 2020-03-24 ENCOUNTER — Encounter: Payer: Self-pay | Admitting: Cardiology

## 2020-03-24 ENCOUNTER — Ambulatory Visit (INDEPENDENT_AMBULATORY_CARE_PROVIDER_SITE_OTHER): Payer: 59 | Admitting: Cardiology

## 2020-03-24 ENCOUNTER — Other Ambulatory Visit: Payer: Self-pay

## 2020-03-24 VITALS — BP 130/82 | HR 67 | Temp 97.0°F | Ht 69.0 in | Wt 167.0 lb

## 2020-03-24 DIAGNOSIS — Z7289 Other problems related to lifestyle: Secondary | ICD-10-CM | POA: Diagnosis not present

## 2020-03-24 DIAGNOSIS — Z79899 Other long term (current) drug therapy: Secondary | ICD-10-CM

## 2020-03-24 DIAGNOSIS — I5041 Acute combined systolic (congestive) and diastolic (congestive) heart failure: Secondary | ICD-10-CM | POA: Diagnosis not present

## 2020-03-24 DIAGNOSIS — Z789 Other specified health status: Secondary | ICD-10-CM

## 2020-03-24 NOTE — Patient Instructions (Signed)
Medication Instructions:  Your physician recommends that you continue on your current medications as directed. Please refer to the Current Medication list given to you today.  *If you need a refill on your cardiac medications before your next appointment, please call your pharmacy*   Lab Work: BMET, BNP, Mag today  If you have labs (blood work) drawn today and your tests are completely normal, you will receive your results only by: Marland Kitchen MyChart Message (if you have MyChart) OR . A paper copy in the mail If you have any lab test that is abnormal or we need to change your treatment, we will call you to review the results.  Follow-Up: At [facility], you and your health needs are our priority.  As part of our continuing mission to provide you with exceptional heart care, we have created designated Provider Care Teams.  These Care Teams include your primary Cardiologist (physician) and Advanced Practice Providers (APPs -  Physician Assistants and Nurse Practitioners) who all work together to provide you with the care you need, when you need it.  We recommend signing up for the patient portal called "MyChart".  Sign up information is provided on this After Visit Summary.  MyChart is used to connect with patients for Virtual Visits (Telemedicine).  Patients are able to view lab/test results, encounter notes, upcoming appointments, etc.  Non-urgent messages can be sent to your provider as well.   To learn more about what you can do with MyChart, go to [email or web address].    Your next appointment:   2 week(s)  The format for your next appointment:   In Person  Provider:   Oswaldo Milian, MD

## 2020-03-25 ENCOUNTER — Telehealth: Payer: Self-pay | Admitting: Cardiology

## 2020-03-25 LAB — BRAIN NATRIURETIC PEPTIDE: BNP: 3023.7 pg/mL — ABNORMAL HIGH (ref 0.0–100.0)

## 2020-03-25 LAB — BASIC METABOLIC PANEL
BUN/Creatinine Ratio: 8 — ABNORMAL LOW (ref 10–24)
BUN: 8 mg/dL (ref 8–27)
CO2: 17 mmol/L — ABNORMAL LOW (ref 20–29)
Calcium: 9.2 mg/dL (ref 8.6–10.2)
Chloride: 90 mmol/L — ABNORMAL LOW (ref 96–106)
Creatinine, Ser: 1.01 mg/dL (ref 0.76–1.27)
GFR calc Af Amer: 90 mL/min/{1.73_m2} (ref >59)
GFR calc non Af Amer: 78 mL/min/{1.73_m2} (ref >59)
Glucose: 82 mg/dL (ref 65–99)
Potassium: 4.3 mmol/L (ref 3.5–5.2)
Sodium: 127 mmol/L — ABNORMAL LOW (ref 134–144)

## 2020-03-25 LAB — MAGNESIUM: Magnesium: 1.2 mg/dL — ABNORMAL LOW (ref 1.6–2.3)

## 2020-03-25 NOTE — Telephone Encounter (Signed)
Spoke with patient and his wife.  His labs show significantly elevated BNP (3024, up from 583) and worsening hyponatremia.  Renal function stable.  Worsening hypomagnesemia (1.2).  Recommend admission for repletion of his magnesium and then close monitoring with IV diuresis.  He does not want to come to ED tonight, but is agreeable to coming tomorrow.  States that he will come to [facility] ED tomorrow morning.

## 2020-03-26 ENCOUNTER — Inpatient Hospital Stay (HOSPITAL_COMMUNITY)
Admission: EM | Admit: 2020-03-26 | Discharge: 2020-03-28 | DRG: 292 | Disposition: A | Discharge status: Home / Self Care | Payer: 59 | Attending: Cardiology | Admitting: Cardiology

## 2020-03-26 ENCOUNTER — Other Ambulatory Visit: Payer: Self-pay

## 2020-03-26 ENCOUNTER — Encounter (HOSPITAL_COMMUNITY): Payer: Self-pay | Admitting: Emergency Medicine

## 2020-03-26 ENCOUNTER — Inpatient Hospital Stay: Payer: Self-pay

## 2020-03-26 ENCOUNTER — Emergency Department (HOSPITAL_COMMUNITY): Payer: 59

## 2020-03-26 DIAGNOSIS — I5043 Acute on chronic combined systolic (congestive) and diastolic (congestive) heart failure: Secondary | ICD-10-CM | POA: Diagnosis present

## 2020-03-26 DIAGNOSIS — Z8546 Personal history of malignant neoplasm of prostate: Secondary | ICD-10-CM

## 2020-03-26 DIAGNOSIS — I110 Hypertensive heart disease with heart failure: Principal | ICD-10-CM | POA: Diagnosis present

## 2020-03-26 DIAGNOSIS — E871 Hypo-osmolality and hyponatremia: Secondary | ICD-10-CM | POA: Diagnosis present

## 2020-03-26 DIAGNOSIS — Z7289 Other problems related to lifestyle: Secondary | ICD-10-CM

## 2020-03-26 DIAGNOSIS — Z79899 Other long term (current) drug therapy: Secondary | ICD-10-CM | POA: Diagnosis not present

## 2020-03-26 DIAGNOSIS — E876 Hypokalemia: Secondary | ICD-10-CM | POA: Diagnosis present

## 2020-03-26 DIAGNOSIS — Z8249 Family history of ischemic heart disease and other diseases of the circulatory system: Secondary | ICD-10-CM

## 2020-03-26 DIAGNOSIS — D509 Iron deficiency anemia, unspecified: Secondary | ICD-10-CM | POA: Diagnosis present

## 2020-03-26 DIAGNOSIS — I5082 Biventricular heart failure: Secondary | ICD-10-CM | POA: Diagnosis present

## 2020-03-26 DIAGNOSIS — E861 Hypovolemia: Secondary | ICD-10-CM | POA: Diagnosis present

## 2020-03-26 DIAGNOSIS — I509 Heart failure, unspecified: Secondary | ICD-10-CM

## 2020-03-26 DIAGNOSIS — Z20822 Contact with and (suspected) exposure to covid-19: Secondary | ICD-10-CM | POA: Diagnosis present

## 2020-03-26 DIAGNOSIS — F1010 Alcohol abuse, uncomplicated: Secondary | ICD-10-CM | POA: Diagnosis present

## 2020-03-26 DIAGNOSIS — I428 Other cardiomyopathies: Secondary | ICD-10-CM | POA: Diagnosis present

## 2020-03-26 DIAGNOSIS — Z720 Tobacco use: Secondary | ICD-10-CM | POA: Diagnosis not present

## 2020-03-26 DIAGNOSIS — I5023 Acute on chronic systolic (congestive) heart failure: Secondary | ICD-10-CM | POA: Diagnosis not present

## 2020-03-26 HISTORY — DX: Hypo-osmolality and hyponatremia: E87.1

## 2020-03-26 HISTORY — DX: Hypomagnesemia: E83.42

## 2020-03-26 LAB — BASIC METABOLIC PANEL
Anion gap: 11 (ref 5–15)
Anion gap: 13 (ref 5–15)
BUN: 7 mg/dL — ABNORMAL LOW (ref 8–23)
BUN: 6 mg/dL — ABNORMAL LOW (ref 8–23)
CO2: 20 mmol/L — ABNORMAL LOW (ref 22–32)
CO2: 21 mmol/L — ABNORMAL LOW (ref 22–32)
Calcium: 8.6 mg/dL — ABNORMAL LOW (ref 8.9–10.3)
Calcium: 9.1 mg/dL (ref 8.9–10.3)
Chloride: 93 mmol/L — ABNORMAL LOW (ref 98–111)
Chloride: 94 mmol/L — ABNORMAL LOW (ref 98–111)
Creatinine, Ser: 1.01 mg/dL (ref 0.61–1.24)
Creatinine, Ser: 0.99 mg/dL (ref 0.61–1.24)
GFR calc Af Amer: >60 mL/min (ref >60)
GFR calc Af Amer: >60 mL/min (ref >60)
GFR calc non Af Amer: >60 mL/min (ref >60)
GFR calc non Af Amer: >60 mL/min (ref >60)
Glucose, Bld: 100 mg/dL — ABNORMAL HIGH (ref 70–99)
Glucose, Bld: 93 mg/dL (ref 70–99)
Potassium: 3.6 mmol/L (ref 3.5–5.1)
Potassium: 3.9 mmol/L (ref 3.5–5.1)
Sodium: 124 mmol/L — ABNORMAL LOW (ref 135–145)
Sodium: 128 mmol/L — ABNORMAL LOW (ref 135–145)

## 2020-03-26 LAB — BRAIN NATRIURETIC PEPTIDE: B Natriuretic Peptide: 2124.9 pg/mL — ABNORMAL HIGH (ref 0.0–100.0)

## 2020-03-26 LAB — TROPONIN I (HIGH SENSITIVITY)
Troponin I (High Sensitivity): 19 ng/L — ABNORMAL HIGH (ref <18)
Troponin I (High Sensitivity): 19 ng/L — ABNORMAL HIGH (ref <18)

## 2020-03-26 LAB — COOXEMETRY PANEL
Carboxyhemoglobin: 0.7 % (ref 0.5–1.5)
Methemoglobin: 0.6 % (ref 0.0–1.5)
O2 Saturation: 11.3 %
Total hemoglobin: 10.4 g/dL — ABNORMAL LOW (ref 12.0–16.0)

## 2020-03-26 LAB — CBC
HCT: 29 % — ABNORMAL LOW (ref 39.0–52.0)
Hemoglobin: 9.8 g/dL — ABNORMAL LOW (ref 13.0–17.0)
MCH: 33.1 pg (ref 26.0–34.0)
MCHC: 33.8 g/dL (ref 30.0–36.0)
MCV: 98 fL (ref 80.0–100.0)
Platelets: 141 10*3/uL — ABNORMAL LOW (ref 150–400)
RBC: 2.96 MIL/uL — ABNORMAL LOW (ref 4.22–5.81)
RDW: 21.7 % — ABNORMAL HIGH (ref 11.5–15.5)
WBC: 5.7 10*3/uL (ref 4.0–10.5)
nRBC: 0 % (ref 0.0–0.2)

## 2020-03-26 LAB — SARS CORONAVIRUS 2 (TAT 6-24 HRS): SARS Coronavirus 2: NEGATIVE

## 2020-03-26 LAB — MAGNESIUM
Magnesium: 1.3 mg/dL — ABNORMAL LOW (ref 1.7–2.4)
Magnesium: 1.6 mg/dL — ABNORMAL LOW (ref 1.7–2.4)

## 2020-03-26 MED ORDER — SODIUM CHLORIDE 0.9% FLUSH: 10.0000 mL | INTRAVENOUS | Status: DC | PRN

## 2020-03-26 MED ORDER — MAGNESIUM SULFATE 2 GM/50ML IV SOLN
2.0000 g | Freq: Once | INTRAVENOUS | Status: AC
  Administered 2020-03-26: 18:00:00 2 g via INTRAVENOUS
  Filled 2020-03-26: qty 50

## 2020-03-26 MED ORDER — FUROSEMIDE 10 MG/ML IJ SOLN
60.0000 mg | Freq: Once | INTRAMUSCULAR | Status: AC
  Administered 2020-03-26: 60 mg via INTRAVENOUS
  Filled 2020-03-26: qty 6

## 2020-03-26 MED ORDER — SODIUM CHLORIDE 0.9 % IV SOLN: 250.0000 mL | INTRAVENOUS | Status: DC | PRN

## 2020-03-26 MED ORDER — MAGNESIUM SULFATE 2 GM/50ML IV SOLN
2.0000 g | Freq: Once | INTRAVENOUS | Status: AC
  Administered 2020-03-26: 11:00:00 2 g via INTRAVENOUS
  Filled 2020-03-26: qty 50

## 2020-03-26 MED ORDER — SODIUM CHLORIDE 0.9% FLUSH: 3.0000 mL | Freq: Once | INTRAVENOUS | Status: DC

## 2020-03-26 MED ORDER — SODIUM CHLORIDE 0.9% FLUSH
3.0000 mL | Freq: Two times a day (BID) | INTRAVENOUS | Status: DC
  Administered 2020-03-26 – 2020-03-27 (×3): 3 mL via INTRAVENOUS

## 2020-03-26 MED ORDER — ACETAMINOPHEN 325 MG PO TABS: 650.0000 mg | ORAL_TABLET | ORAL | Status: DC | PRN

## 2020-03-26 MED ORDER — ENOXAPARIN SODIUM 40 MG/0.4ML ~~LOC~~ SOLN
40.0000 mg | SUBCUTANEOUS | Status: DC
  Administered 2020-03-26 – 2020-03-27 (×2): 40 mg via SUBCUTANEOUS
  Filled 2020-03-26 (×3): qty 0.4

## 2020-03-26 MED ORDER — SPIRONOLACTONE 12.5 MG HALF TABLET
12.5000 mg | ORAL_TABLET | Freq: Every day | ORAL | Status: DC
  Administered 2020-03-26 – 2020-03-28 (×3): 12.5 mg via ORAL
  Filled 2020-03-26 (×3): qty 1

## 2020-03-26 MED ORDER — CHLORHEXIDINE GLUCONATE CLOTH 2 % EX PADS
6.0000 | MEDICATED_PAD | Freq: Every day | CUTANEOUS | Status: DC
  Administered 2020-03-27 – 2020-03-28 (×2): 6 via TOPICAL

## 2020-03-26 MED ORDER — ONDANSETRON HCL 4 MG/2ML IJ SOLN: 4.0000 mg | Freq: Four times a day (QID) | INTRAMUSCULAR | Status: DC | PRN

## 2020-03-26 MED ORDER — POTASSIUM CHLORIDE CRYS ER 20 MEQ PO TBCR
40.0000 meq | EXTENDED_RELEASE_TABLET | Freq: Once | ORAL | Status: AC
  Administered 2020-03-26: 40 meq via ORAL
  Filled 2020-03-26: qty 2

## 2020-03-26 MED ORDER — SODIUM CHLORIDE 0.9% FLUSH: 3.0000 mL | INTRAVENOUS | Status: DC | PRN

## 2020-03-26 NOTE — Progress Notes (Signed)
Peripherally Inserted Central Catheter Placement  The IV Nurse has discussed with the patient and/or persons authorized to consent for the patient, the purpose of this procedure and the potential benefits and risks involved with this procedure.  The benefits include less needle sticks, lab draws from the catheter, and the patient may be discharged home with the catheter. Risks include, but not limited to, infection, bleeding, blood clot (thrombus formation), and puncture of an artery; nerve damage and irregular heartbeat and possibility to perform a PICC exchange if needed/ordered by physician.  Alternatives to this procedure were also discussed.  Bard Power PICC patient education guide, fact sheet on infection prevention and patient information card has been provided to patient /or left at bedside.    PICC Placement Documentation  PICC Double Lumen [id number] PICC Right Basilic 35 cm (Active)  Indication for Insertion or Continuance of Line Vasoactive infusions 03/26/20 1800  Site Assessment Clean;Dry;Intact 03/26/20 1800  Lumen #1 Status Flushed;Blood return noted;Saline locked 03/26/20 1800  Lumen #2 Status Flushed;Blood return noted;Saline locked 03/26/20 1800  Dressing Type Transparent 03/26/20 1800  Dressing Status Clean;Dry;Intact;Antimicrobial disc in place 03/26/20 1800  [place] checked and tightened 03/26/20 1800  Dressing Change Due 04/02/20 03/26/20 1800       Austin [name] 03/26/2020, 6:49 PM

## 2020-03-26 NOTE — ED Provider Notes (Signed)
Emergency Department Provider Note   I have reviewed the triage vital signs and the nursing notes.   HISTORY  Chief Complaint abnormal labs   HPI Austin [name] is a 64 y.o. male with past medical history of nonischemic cardiomyopathy with EF of less than 20% presents to the emergency department with abnormal labs.  He had labs drawn at his cardiology office on 4/16.  Labs were drawn at that visit and results came back yesterday.  He was called by his cardiologist who recommended he come into the emergency department for evaluation and admission for replacement of his magnesium along with diuresis.  Patient states that he is not having symptoms such as shortness of breath or chest pain.  His weight has been stable in the 165 range.  He has been compliant with his diuresis at home.  He denies feeling short of breath or having chest pain.   Past Medical History:  Diagnosis Date  . Arthritis    hands  . GERD (gastroesophageal reflux disease)   . Hypertension   . Hypomagnesemia   . Hyponatremia   . Prostate cancer [facility])     Patient Active Problem List   Diagnosis Date Noted  . NICM (nonischemic cardiomyopathy) ([place])   . ETOH abuse 11/10/2018  . Essential hypertension 11/10/2018  . Blood in stool 11/10/2018  . Prostate cancer ([place]) 01/26/2014    Past Surgical History:  Procedure Laterality Date  . LYMPHADENECTOMY Bilateral 01/26/2014   Procedure: LYMPHADENECTOMY WITH INDOCYANINE GREEN DYE;  Surgeon: Alexis Frock, MD;  [facility];  Service: Urology;  Laterality: Bilateral;  . RIGHT/LEFT HEART CATH AND CORONARY ANGIOGRAPHY N/A 02/25/2020   Procedure: RIGHT/LEFT HEART CATH AND CORONARY ANGIOGRAPHY;  Surgeon: Burnell Blanks, MD;  Location: [place] CV LAB;  Service: Cardiovascular;  Laterality: N/A;  . ROBOT ASSISTED LAPAROSCOPIC RADICAL PROSTATECTOMY N/A 01/26/2014   Procedure: ROBOTIC ASSISTED LAPAROSCOPIC RADICAL PROSTATECTOMY;  Surgeon: Alexis Frock, MD;   [facility];  Service: Urology;  Laterality: N/A;  . SHOULDER SURGERY Left     Allergies Latex  Family History  Problem Relation Age of Onset  . Hypertension Mother   . Hypertension Father     Social History Social History   Tobacco Use  . Smoking status: Never Smoker  . Smokeless tobacco: Never Used  Substance Use Topics  . Alcohol use: Yes    Comment: 3 beers daily  . Drug use: No    Review of Systems  Constitutional: No fever/chills. Cardiovascular: Denies chest pain. Respiratory: Denies shortness of breath. Gastrointestinal: No abdominal pain.  Genitourinary: Negative for dysuria. Musculoskeletal: Negative for back pain. Skin: Negative for rash. Neurological: Negative for headaches.  10-point ROS otherwise negative.  ____________________________________________   PHYSICAL EXAM:  VITAL SIGNS: ED Triage Vitals  Enc Vitals Group     BP 03/26/20 0723 119/89     Pulse Rate 03/26/20 0723 (!) 101     Resp 03/26/20 0723 14     Temp 03/26/20 0723 98.7 F (37.1 C)     Temp Source 03/26/20 0723 Oral     SpO2 03/26/20 0723 100 %     Weight 03/26/20 0723 165 lb (74.8 kg)     Height 03/26/20 0723 5' 9.5" (1.765 m)   Constitutional: Alert and oriented. Well appearing and in no acute distress. Eyes: Conjunctivae are normal.  Head: Atraumatic. Nose: No congestion/rhinnorhea. Mouth/Throat: Mucous membranes are moist.  Neck: No stridor.   Cardiovascular: Tachycardia. Good peripheral circulation. Grossly normal heart  sounds.   Respiratory: Normal respiratory effort.  No retractions. Lungs CTAB. Gastrointestinal: Soft and nontender. No distention.  Musculoskeletal: No gross deformities of extremities. Neurologic:  Normal speech and language.  Skin:  Skin is warm, dry and intact. No rash noted.  ____________________________________________   LABS (all labs ordered are listed, but only abnormal results are displayed)  Labs Reviewed  BASIC METABOLIC PANEL -  Abnormal; Notable for the following components:      Result Value   Sodium 124 (*)    Chloride 93 (*)    CO2 20 (*)    Glucose, Bld 100 (*)    BUN 7 (*)    Calcium 8.6 (*)    All other components within normal limits  CBC - Abnormal; Notable for the following components:   RBC 2.96 (*)    Hemoglobin 9.8 (*)    HCT 29.0 (*)    RDW 21.7 (*)    Platelets 141 (*)    All other components within normal limits  BRAIN NATRIURETIC PEPTIDE - Abnormal; Notable for the following components:   B Natriuretic Peptide 2,124.9 (*)    All other components within normal limits  MAGNESIUM - Abnormal; Notable for the following components:   Magnesium 1.3 (*)    All other components within normal limits  TROPONIN I (HIGH SENSITIVITY) - Abnormal; Notable for the following components:   Troponin I (High Sensitivity) 19 (*)    All other components within normal limits  TROPONIN I (HIGH SENSITIVITY) - Abnormal; Notable for the following components:   Troponin I (High Sensitivity) 19 (*)    All other components within normal limits   ____________________________________________  EKG   EKG Interpretation  Date/Time:  Sunday March 26 2020 07:31:25 EDT Ventricular Rate:  102 PR Interval:  172 QRS Duration: 112 QT Interval:  380 QTC Calculation: 495 R Axis:   -49 Text Interpretation: Sinus tachycardia Possible Left atrial enlargement Left axis deviation Minimal voltage criteria for LVH, may be normal variant ( Cornell product ) ST & T wave abnormality, consider lateral ischemia Abnormal ECG No STEMI Confirmed by Nanda Quinton [phone]) on 03/26/2020 8:22:13 AM       ____________________________________________  RADIOLOGY  DG Chest Portable 1 View  Result Date: 03/26/2020 CLINICAL DATA:  Shortness of breath.  Hypertension. EXAM: PORTABLE CHEST 1 VIEW COMPARISON:  01/28/2020 FINDINGS: Midline trachea. Moderate cardiomegaly. Small left pleural effusion is similar. Right pleural effusion has resolved.  No pneumothorax. Possible mild pulmonary venous congestion, without overt congestive failure. Left greater than right base airspace disease, similar on the left and improved on the right. IMPRESSION: Cardiomegaly with possible mild pulmonary venous congestion, but no overt congestive failure. Persistent small left pleural effusion with adjacent atelectasis or infection. Improved right base Airspace disease, likely atelectasis. Electronically Signed   By: Abigail Miyamoto M.D.   On: 03/26/2020 09:09    ____________________________________________   PROCEDURES  Procedure(s) performed:   Procedures  CRITICAL CARE Performed by: Margette Fast Total critical care time: 35 minutes Critical care time was exclusive of separately billable procedures and treating other patients. Critical care was necessary to treat or prevent imminent or life-threatening deterioration. Critical care was time spent personally by me on the following activities: development of treatment plan with patient and/or surrogate as well as nursing, discussions with consultants, evaluation of patient's response to treatment, examination of patient, obtaining history from patient or surrogate, ordering and performing treatments and interventions, ordering and review of laboratory studies, ordering and review of  radiographic studies, pulse oximetry and re-evaluation of patient's condition.  Nanda Quinton, MD Emergency Medicine ____________________________________________   INITIAL IMPRESSION / ASSESSMENT AND PLAN / ED COURSE  Pertinent labs & imaging results that were available during my care of the patient were reviewed by me and considered in my medical decision making (see chart for details).   Patient presents to the emergency department evaluation of abnormal labs drawn at his cardiology office 2 days ago.  Plan for repeat labs here including magnesium.  Will obtain chest x-ray.  Patient is not having increased shortness of  breath.  Appears relatively euvolemic on exam here.   Labs reviewed and discussed with patient and Dr. Gardiner Rhyme with Cardiology. He will admit. Mg replaced and will give lasix.   Discussed patient's case with Cardiology, Dr. Gardiner Rhyme to request admission. Patient and family (if present) updated with plan. Care transferred to Cardiology service.  I reviewed all nursing notes, vitals, pertinent old records, EKGs, labs, imaging (as available).  ____________________________________________  FINAL CLINICAL IMPRESSION(S) / ED DIAGNOSES  Final diagnoses:  Hypomagnesemia  Congestive heart failure, unspecified HF chronicity, unspecified heart failure type ([place])    MEDICATIONS GIVEN DURING THIS VISIT:  Medications  sodium chloride flush (NS) 0.9 % injection 3 mL (3 mLs Intravenous Not Given 03/26/20 0851)  magnesium sulfate IVPB 2 g 50 mL (2 g Intravenous New Bag/Given 03/26/20 1054)  furosemide (LASIX) injection 60 mg (60 mg Intravenous Given 03/26/20 1125)    Note:  This document was prepared using Dragon voice recognition software and may include unintentional dictation errors.  Nanda Quinton, MD, [facility] Emergency Medicine    Davelyn Gwinn, [facility], MD 03/26/20 [phone]

## 2020-03-26 NOTE — H&P (Addendum)
Cardiology Admission History and Physical:   Patient ID: Austin [name] MRN: [id number]; DOB: [birth year]   Admission date: 03/26/2020  Primary Care Provider: [name], MD Primary Cardiologist: No primary care provider on file. Primary Electrophysiologist:  None   Chief Complaint:  Heart failure  Patient Profile:   Austin [name] is a 64 y.o. male with history of prostate cancer, hypertension, alcohol use, recently diagnosed combined systolic/diastolic heart failure who presents with decompensated heart failure.    History of Present Illness:   [name] is a 64 y.o. male with history of prostate cancer, hypertension, alcohol use, recently diagnosed combined systolic/diastolic heart failure who presents with decompensated heart failure.  He presented to urgent care on 01/28/2020 with nausea, vomiting, diarrhea, cough, and dyspnea on exertion.  Chest x-ray showed bilateral small pleural effusions.  BNP was elevated.  Discharged on Lasix 40 mg daily x5 days.  TTE on 02/16/2020 showed EF less than 20%, global hypokinesis, severe LV dilatation, grade 3 diastolic dysfunction, severe RV dysfunction, severe left atrial dilatation, severe right atrial dilatation, mild mitral regurgitation, mild to moderate tricuspid regurgitation.  He was started on Entresto on 3/11 by his PCP and referred to cardiology.  Reports he would drink about 5 beers/day.    At initial clinic visit on 02/22/2020, he was hypotensive to 82/64.  He had recently been started on Entresto, this was discontinued.  He underwent LHC/RHC on 02/25/2020, which showed no coronary artery disease, but elevated filling pressures (RA 15, RV 49/8, PA 51/24/35, PW 19, LVEDP 28, CI 4.0).  He was given a dose of IV Lasix in the Cath Lab and instructed to take Lasix 40 mg p.o. twice daily for 3 days then return to 40 mg daily.  At clinic visit on  03/03/20, noted to be volume overloaded, lasix dose increased to 40 mg twice daily was also  started on losartan 25 mg.  Labs on 03/09/2020 showed worsening renal function (creatinine 1.0->1.5) and worsening hyponatremia (131 ->127).  Losartan was discontinued and Lasix was held for 2 days and restarted at 40 mg daily.  Repeat labs 03/13/2020 showed creatinine had normalized (1.0).  At clinic visit on 4/16, he was noted to be volume overloaded.  His Labs showed significantly elevated BNP (3024, up from 583) and worsening hyponatremia.  Renal function stable.  Worsening hypomagnesemia (1.2).  Recommended admission for repletion of his magnesium and close monitoring with IV diuresis.  He currently reports his dyspnea is stable.  He had diarrhea earlier this week, but states that has resolved.   Past Medical History:  Diagnosis Date  . Arthritis    hands  . GERD (gastroesophageal reflux disease)   . Hypertension   . Hypomagnesemia   . Hyponatremia   . Prostate cancer [facility])     Past Surgical History:  Procedure Laterality Date  . LYMPHADENECTOMY Bilateral 01/26/2014   Procedure: LYMPHADENECTOMY WITH INDOCYANINE GREEN DYE;  Surgeon: Alexis Frock, MD;  Location: [facility];  Service: Urology;  Laterality: Bilateral;  . RIGHT/LEFT HEART CATH AND CORONARY ANGIOGRAPHY N/A 02/25/2020   Procedure: RIGHT/LEFT HEART CATH AND CORONARY ANGIOGRAPHY;  Surgeon: Burnell Blanks, MD;  Location: [facility];  Service: Cardiovascular;  Laterality: N/A;  . ROBOT ASSISTED LAPAROSCOPIC RADICAL PROSTATECTOMY N/A 01/26/2014   Procedure: ROBOTIC ASSISTED LAPAROSCOPIC RADICAL PROSTATECTOMY;  Surgeon: Alexis Frock, MD;  Location: [facility];  Service: Urology;  Laterality: N/A;  . SHOULDER SURGERY Left      Medications Prior to Admission: Prior to  Admission medications   Medication Sig Start Date End Date Taking? Authorizing Provider  furosemide (LASIX) 40 MG tablet Take 1 tablet (40 mg total) by mouth daily. 03/10/20  Yes Donato Heinz, MD  Magnesium Oxide 400 MG CAPS Take 1 capsule (400 mg  total) by mouth 2 (two) times daily. 03/04/20  Yes Donato Heinz, MD  metoprolol succinate (TOPROL XL) 25 MG 24 hr tablet Take 0.5 tablets (12.5 mg total) by mouth daily. 03/17/20  Yes Donato Heinz, MD  Multiple Vitamin (MULTIVITAMIN) tablet Take 1 tablet by mouth daily.   Yes [provider]  potassium chloride SA (KLOR-CON M20) 20 MEQ tablet Take 1 tablet (20 mEq total) by mouth daily. 03/04/20  Yes Donato Heinz, MD     Allergies:    Allergies  Allergen Reactions  . Latex Swelling and Rash    Social History:   Social History   Socioeconomic History  . Marital status: Married    Spouse name: Not on file  . Number of children: Not on file  . Years of education: Not on file  . Highest education level: Not on file  Occupational History  . Not on file  Tobacco Use  . Smoking status: Never Smoker  . Smokeless tobacco: Never Used  Substance and Sexual Activity  . Alcohol use: Yes    Comment: 3 beers daily  . Drug use: No  . Sexual activity: Not on file  Other Topics Concern  . Not on file  Social History Narrative  . Not on file   Social Determinants of Health   Financial Resource Strain:   . Difficulty of Paying Living Expenses:   Food Insecurity:   . Worried About Charity fundraiser in the Last Year:   . Arboriculturist in the Last Year:   Transportation Needs:   . Film/video editor (Medical):   Marland Kitchen Lack of Transportation (Non-Medical):   Physical Activity:   . Days of Exercise per Week:   . Minutes of Exercise per Session:   Stress:   . Feeling of Stress :   Social Connections:   . Frequency of Communication with Friends and Family:   . Frequency of Social Gatherings with Friends and Family:   . Attends Religious Services:   . Active Member of Clubs or Organizations:   . Attends Archivist Meetings:   Marland Kitchen Marital Status:   Intimate Partner Violence:   . Fear of Current or Ex-Partner:   . Emotionally Abused:     Marland Kitchen Physically Abused:   . Sexually Abused:     Family History:   The patient's family history includes Hypertension in his father and mother.    ROS:  Please see the history of present illness.  All other ROS reviewed and negative.     Physical Exam/Data:   Vitals:   03/26/20 1115 03/26/20 1130 03/26/20 1145 03/26/20 1200  BP: (!) 122/92 (!) 127/92 (!) 127/95 138/83  Pulse:  97    Resp: (!) 25 (!) 27 19 (!) 27  Temp:      TempSrc:      SpO2:  100%    Weight:      Height:        Intake/Output Summary (Last 24 hours) at 03/26/2020 1215 Last data filed at 03/26/2020 1154 Gross per 24 hour  Intake 50 ml  Output 275 ml  Net -225 ml   Last 3 Weights 03/26/2020 03/24/2020 03/17/2020  Weight (lbs)  165 lb 167 lb 162 lb 3.2 oz  Weight (kg) 74.844 kg 75.751 kg 73.573 kg     Body mass index is 24.02 kg/m.  General:  in no acute distress HEENT: normal Lymph: no adenopathy Neck: + JVD Endocrine:  No thryomegaly Vascular: No carotid bruits Cardiac:  normal S1, S2; RRR; no murmur  Lungs:  clear to auscultation bilaterally, no wheezing, rhonchi or rales  Abd: soft, nontender, no hepatomegaly  Ext: 1+ LE edema Musculoskeletal:  No deformities, BUE and BLE strength normal and equal Skin: warm and dry  Neuro:  CNs 2-12 intact, no focal abnormalities noted Psych:  Normal affect    EKG:  The ECG that was done  was personally reviewed and demonstrates sinus tachycardia, rate 102, LVH, T wave inversion in V5/6  Relevant CV Studies: TTE 02/16/20: 1. Left ventricular ejection fraction, by estimation, is <20%. The left  ventricle has severely decreased function. The left ventricle demonstrates  global hypokinesis. The left ventricular internal cavity size was severely  dilated. Left ventricular  diastolic parameters are consistent with Grade III diastolic dysfunction  (restrictive). There is the interventricular septum is flattened in  diastole ('D' shaped left ventricle), consistent  with right ventricular  volume overload.  2. Right ventricular systolic function is severely reduced. The right  ventricular size is severely enlarged. There is normal pulmonary artery  systolic pressure.  3. Left atrial size was severely dilated.  4. Right atrial size was severely dilated.  5. The mitral valve is normal in structure. Mild mitral valve  regurgitation. No evidence of mitral stenosis.  6. Tricuspid valve regurgitation is mild to moderate.  7. The aortic valve is normal in structure. Aortic valve regurgitation is  not visualized. No aortic stenosis is present.   LHC/RHC 02/25/20:  Hemodynamic findings consistent with mild pulmonary hypertension.   1. No angiographic evidence of CAD 2. Elevated right and left heart pressures.   Medical management of cardiomyopathy. Would recommend starting a beta blocker and Ace-inh/ARB as BP tolerates. Continue Lasix. Will give one dose of IV Lasix today. I will have him take lasix 40 mg po BID at home for three days then return to 40 mg daily.   Laboratory Data:  High Sensitivity Troponin:   Recent Labs  Lab 03/26/20 0825 03/26/20 1035  TROPONINIHS 19* 19*      Chemistry Recent Labs  Lab 03/24/20 0956 03/26/20 0734  NA 127* 124*  K 4.3 3.6  CL 90* 93*  CO2 17* 20*  GLUCOSE 82 100*  BUN 8 7*  CREATININE 1.01 1.01  CALCIUM 9.2 8.6*  GFRNONAA 78 >60  GFRAA 90 >60  ANIONGAP  --  11    No results for input(s): PROT, ALBUMIN, AST, ALT, ALKPHOS, BILITOT in the last 168 hours. Hematology Recent Labs  Lab 03/26/20 0734  WBC 5.7  RBC 2.96*  HGB 9.8*  HCT 29.0*  MCV 98.0  MCH 33.1  MCHC 33.8  RDW 21.7*  PLT 141*   BNP Recent Labs  Lab 03/24/20 0956 03/26/20 0825  BNP 3,023.7* 2,124.9*    DDimer No results for input(s): DDIMER in the last 168 hours.   Radiology/Studies:  DG Chest Portable 1 View  Result Date: 03/26/2020 CLINICAL DATA:  Shortness of breath.  Hypertension. EXAM: PORTABLE CHEST 1 VIEW  COMPARISON:  01/28/2020 FINDINGS: Midline trachea. Moderate cardiomegaly. Small left pleural effusion is similar. Right pleural effusion has resolved. No pneumothorax. Possible mild pulmonary venous congestion, without overt congestive failure. Left greater than  right base airspace disease, similar on the left and improved on the right. IMPRESSION: Cardiomegaly with possible mild pulmonary venous congestion, but no overt congestive failure. Persistent small left pleural effusion with adjacent atelectasis or infection. Improved right base Airspace disease, likely atelectasis. Electronically Signed   By: Abigail Miyamoto M.D.   On: 03/26/2020 09:09    Assessment and Plan:   Acute on chronic combined systolic/diastolic heart failure: TTE on 02/16/2020 with LVEF less than 20%, severe RV systolic dysfunction.  Right heart cath/left heart cath on 3/19 showed normal coronary arteries, but elevated filling pressures (RA 15, RV 49/8, PA 51/24/35, PW 19, LVEDP 28, CI 4.0).  Difficulty with tolerating GDMT due to hypotension/AKI.  Worsening volume overload and hyponatremia/hypomagnesemia as outpatient, prompting inpatient admission -Given IV Lasix 60 mg in the ED, will follow up response -Start spironolactone 12.5 mg daily -Place PICC line to check CVP/co-ox -Has been unable to tolerate Entresto (hypotension), losartan (AKI), and had recent decompensation after starting metoprolol.  Will hold beta-blocker -Cardiac MRI has been scheduled as outpatient for further work-up of his cardiomyopathy  Hypomagnesemia: Magnesium 1.3.  Given 2 mg IV magnesium in the ED. we will follow and continue to replete as needed  Alcohol use: Previously was drinking at least 5 beers per night, which may be etiology of his cardiomyopathy.  He has cut back significantly, reported only 1 beer in the past week.  Will monitor for evidence of withdrawal   Severity of Illness: The appropriate patient status for this patient is INPATIENT.  Inpatient status is judged to be reasonable and necessary in order to provide the required intensity of service to ensure the patient's safety. The patient's presenting symptoms, physical exam findings, and initial radiographic and laboratory data in the context of their chronic comorbidities is felt to place them at high risk for further clinical deterioration. Furthermore, it is not anticipated that the patient will be medically stable for discharge from the hospital within 2 midnights of admission. The following factors support the patient status of inpatient.   " The patient's presenting symptoms include acute on chronic combined systolic and diastolic heart failure. " The worrisome physical exam findings include +JVD. LE edema. " The initial radiographic and laboratory data are worrisome because of hyponatremia. " The chronic co-morbidities include chronic combined systolic and diastolic heart failure.   * I certify that at the point of admission it is my clinical judgment that the patient will require inpatient hospital care spanning beyond 2 midnights from the point of admission due to high intensity of service, high risk for further deterioration and high frequency of surveillance required.*    For questions or updates, please contact [place] Please consult [email or web address] for contact info under        Signed, Donato Heinz, MD  03/26/2020 12:15 PM

## 2020-03-26 NOTE — ED Triage Notes (Signed)
Pt states PCP called him yesterday and told him to come to ED for abnormal labs.  Per note, pt with elevated BNP, hyponatremia, and hypomagnesemia.  Pt states he feels fine.

## 2020-03-27 DIAGNOSIS — E871 Hypo-osmolality and hyponatremia: Secondary | ICD-10-CM

## 2020-03-27 DIAGNOSIS — Z720 Tobacco use: Secondary | ICD-10-CM

## 2020-03-27 LAB — IRON AND TIBC
Iron: 27 ug/dL — ABNORMAL LOW (ref 45–182)
Saturation Ratios: 6 % — ABNORMAL LOW (ref 17.9–39.5)
TIBC: 416 ug/dL (ref 250–450)
UIBC: 389 ug/dL

## 2020-03-27 LAB — TSH: TSH: 0.936 u[IU]/mL (ref 0.350–4.500)

## 2020-03-27 LAB — BASIC METABOLIC PANEL
Anion gap: 10 (ref 5–15)
BUN: 7 mg/dL — ABNORMAL LOW (ref 8–23)
CO2: 23 mmol/L (ref 22–32)
Calcium: 8.7 mg/dL — ABNORMAL LOW (ref 8.9–10.3)
Chloride: 96 mmol/L — ABNORMAL LOW (ref 98–111)
Creatinine, Ser: 0.92 mg/dL (ref 0.61–1.24)
GFR calc Af Amer: >60 mL/min (ref >60)
GFR calc non Af Amer: >60 mL/min (ref >60)
Glucose, Bld: 126 mg/dL — ABNORMAL HIGH (ref 70–99)
Potassium: 3.3 mmol/L — ABNORMAL LOW (ref 3.5–5.1)
Sodium: 129 mmol/L — ABNORMAL LOW (ref 135–145)

## 2020-03-27 LAB — CBC
HCT: 30.1 % — ABNORMAL LOW (ref 39.0–52.0)
Hemoglobin: 9.9 g/dL — ABNORMAL LOW (ref 13.0–17.0)
MCH: 32.4 pg (ref 26.0–34.0)
MCHC: 32.9 g/dL (ref 30.0–36.0)
MCV: 98.4 fL (ref 80.0–100.0)
Platelets: 146 10*3/uL — ABNORMAL LOW (ref 150–400)
RBC: 3.06 MIL/uL — ABNORMAL LOW (ref 4.22–5.81)
RDW: 21.7 % — ABNORMAL HIGH (ref 11.5–15.5)
WBC: 4.6 10*3/uL (ref 4.0–10.5)
nRBC: 0 % (ref 0.0–0.2)

## 2020-03-27 LAB — COOXEMETRY PANEL
Carboxyhemoglobin: 1.6 % — ABNORMAL HIGH (ref 0.5–1.5)
Methemoglobin: 0.4 % (ref 0.0–1.5)
O2 Saturation: 63.8 %
Total hemoglobin: 9.8 g/dL — ABNORMAL LOW (ref 12.0–16.0)

## 2020-03-27 LAB — MAGNESIUM: Magnesium: 1.6 mg/dL — ABNORMAL LOW (ref 1.7–2.4)

## 2020-03-27 LAB — FERRITIN: Ferritin: 79 ng/mL (ref 24–336)

## 2020-03-27 LAB — VITAMIN B12: Vitamin B-12: 892 pg/mL (ref 180–914)

## 2020-03-27 MED ORDER — DIGOXIN 125 MCG PO TABS
0.1250 mg | ORAL_TABLET | Freq: Every day | ORAL | Status: DC
  Administered 2020-03-28: 0.125 mg via ORAL
  Filled 2020-03-27: qty 1

## 2020-03-27 MED ORDER — SACUBITRIL-VALSARTAN 24-26 MG PO TABS
1.0000 | ORAL_TABLET | Freq: Two times a day (BID) | ORAL | Status: DC
  Administered 2020-03-27 – 2020-03-28 (×2): 1 via ORAL
  Filled 2020-03-27 (×2): qty 1

## 2020-03-27 MED ORDER — FUROSEMIDE 10 MG/ML IJ SOLN
80.0000 mg | Freq: Two times a day (BID) | INTRAMUSCULAR | Status: DC
  Administered 2020-03-28: 09:00:00 80 mg via INTRAVENOUS
  Filled 2020-03-27: qty 8

## 2020-03-27 MED ORDER — FUROSEMIDE 10 MG/ML IJ SOLN
40.0000 mg | Freq: Two times a day (BID) | INTRAMUSCULAR | Status: DC
  Administered 2020-03-27: 40 mg via INTRAVENOUS
  Filled 2020-03-27: qty 4

## 2020-03-27 MED ORDER — POTASSIUM CHLORIDE CRYS ER 20 MEQ PO TBCR
40.0000 meq | EXTENDED_RELEASE_TABLET | ORAL | Status: AC
  Administered 2020-03-27 (×2): 40 meq via ORAL
  Filled 2020-03-27 (×2): qty 2

## 2020-03-27 MED ORDER — MAGNESIUM SULFATE 2 GM/50ML IV SOLN
2.0000 g | Freq: Once | INTRAVENOUS | Status: AC
  Administered 2020-03-27: 2 g via INTRAVENOUS
  Filled 2020-03-27: qty 50

## 2020-03-27 NOTE — Progress Notes (Addendum)
Progress Note  Patient Name: Austin [name] Date of Encounter: 03/27/2020  Primary Cardiologist: No primary care provider on file.   Subjective   PICC line placed, CVP 17 yesterday, but not getting a good waveform this morning.  Co-ox 64%.  Net negative 2.9L on IV lasix 60 mg yesterday.  Weight down 5 lbs.  Reports dyspnea improved.  Inpatient Medications    Scheduled Meds: . Chlorhexidine Gluconate Cloth  6 each Topical Daily  . enoxaparin (LOVENOX) injection  40 mg Subcutaneous Q24H  . sodium chloride flush  3 mL Intravenous Once  . sodium chloride flush  3 mL Intravenous Q12H  . spironolactone  12.5 mg Oral Daily   Continuous Infusions: . sodium chloride     PRN Meds: sodium chloride, acetaminophen, ondansetron (ZOFRAN) IV, sodium chloride flush, sodium chloride flush   Vital Signs    Vitals:   03/26/20 2357 03/27/20 0419 03/27/20 0700 03/27/20 0736  BP: 126/87 112/74 (!) 129/91   Pulse: 95 95    Resp:   16   Temp: 98.5 F (36.9 C) 98.2 F (36.8 C)  98 F (36.7 C)  TempSrc: Axillary Oral  Oral  SpO2:   100%   Weight:  72.6 kg    Height:        Intake/Output Summary (Last 24 hours) at 03/27/2020 0919 Last data filed at 03/27/2020 0700 Gross per 24 hour  Intake 290 ml  Output 3155 ml  Net -2865 ml   Last 3 Weights 03/27/2020 03/26/2020 03/26/2020  Weight (lbs) 160 lb 158 lb 4.8 oz 165 lb  Weight (kg) 72.576 kg 71.804 kg 74.844 kg      Telemetry    Sinus rhythm, rate 80s to 90s- Personally Reviewed  ECG    No new EKG- Personally Reviewed  Physical Exam   GEN: No acute distress.   Neck: + JVD Cardiac: RRR, no murmurs Respiratory: Clear to auscultation bilaterally. GI: Soft, nontender, non-distended  MS: No edema; No deformity. Neuro:  Nonfocal  Psych: Normal affect   Labs    High Sensitivity Troponin:   Recent Labs  Lab 03/26/20 0825 03/26/20 1035  TROPONINIHS 19* 19*      Chemistry Recent Labs  Lab 03/24/20 0956 03/26/20 0734  03/26/20 1633  NA 127* 124* 128*  K 4.3 3.6 3.9  CL 90* 93* 94*  CO2 17* 20* 21*  GLUCOSE 82 100* 93  BUN 8 7* 6*  CREATININE 1.01 1.01 0.99  CALCIUM 9.2 8.6* 9.1  GFRNONAA 78 >60 >60  GFRAA 90 >60 >60  ANIONGAP  --  11 13     Hematology Recent Labs  Lab 03/26/20 0734  WBC 5.7  RBC 2.96*  HGB 9.8*  HCT 29.0*  MCV 98.0  MCH 33.1  MCHC 33.8  RDW 21.7*  PLT 141*    BNP Recent Labs  Lab 03/24/20 0956 03/26/20 0825  BNP 3,023.7* 2,124.9*     DDimer No results for input(s): DDIMER in the last 168 hours.   Radiology    DG Chest Portable 1 View  Result Date: 03/26/2020 CLINICAL DATA:  Shortness of breath.  Hypertension. EXAM: PORTABLE CHEST 1 VIEW COMPARISON:  01/28/2020 FINDINGS: Midline trachea. Moderate cardiomegaly. Small left pleural effusion is similar. Right pleural effusion has resolved. No pneumothorax. Possible mild pulmonary venous congestion, without overt congestive failure. Left greater than right base airspace disease, similar on the left and improved on the right. IMPRESSION: Cardiomegaly with possible mild pulmonary venous congestion, but no overt congestive  failure. Persistent small left pleural effusion with adjacent atelectasis or infection. Improved right base Airspace disease, likely atelectasis. Electronically Signed   By: Abigail Miyamoto M.D.   On: 03/26/2020 09:09   [place] EKG SITE RITE  Result Date: 03/26/2020 If Site Rite image not attached, placement could not be confirmed due to current cardiac rhythm.   Cardiac Studies   TTE 02/16/20: 1. Left ventricular ejection fraction, by estimation, is <20%. The left  ventricle has severely decreased function. The left ventricle demonstrates  global hypokinesis. The left ventricular internal cavity size was severely  dilated. Left ventricular  diastolic parameters are consistent with Grade III diastolic dysfunction  (restrictive). There is the interventricular septum is flattened in  diastole ('D' shaped  left ventricle), consistent with right ventricular  volume overload.  2. Right ventricular systolic function is severely reduced. The right  ventricular size is severely enlarged. There is normal pulmonary artery  systolic pressure.  3. Left atrial size was severely dilated.  4. Right atrial size was severely dilated.  5. The mitral valve is normal in structure. Mild mitral valve  regurgitation. No evidence of mitral stenosis.  6. Tricuspid valve regurgitation is mild to moderate.  7. The aortic valve is normal in structure. Aortic valve regurgitation is  not visualized. No aortic stenosis is present.   LHC/RHC 02/25/20:  Hemodynamic findings consistent with mild pulmonary hypertension.  1. No angiographic evidence of CAD 2. Elevated right and left heart pressures.   Medical management of cardiomyopathy. Would recommend starting a beta blocker and Ace-inh/ARB as BP tolerates. Continue Lasix. Will give one dose of IV Lasix today. I will have him take lasix 40 mg po BID at home for three days then return to 40 mg daily.   Patient Profile     64 y.o. male with history of prostate cancer, hypertension, alcohol use, recently diagnosed combined systolic/diastolic heart failure who presents with decompensated heart failure  Assessment & Plan    Acute on chronic combined systolic/diastolic heart failure: TTE on 02/16/2020 with LVEF less than 20%, severe RV systolic dysfunction.  Right heart cath/left heart cath on 3/19 showed normal coronary arteries, but elevated filling pressures (RA 15, RV 49/8, PA 51/24/35, PW 19, LVEDP 28, CI 4.0).  Difficulty with tolerating GDMT due to hypotension/AKI.  Worsening volume overload and hyponatremia/hypomagnesemia as outpatient, prompting inpatient admission.  PICC line placed, CVP 17, Co-ox 64% on presentation -IV Lasix 40 mg BID -Continue spironolactone 12.5 mg daily -Has been unable to tolerate Entresto (hypotension), losartan (AKI), and had  recent decompensation after starting metoprolol.  Will hold off on ACE/ARB or beta-blocker -Cardiac MRI has been scheduled as outpatient for further work-up of his cardiomyopathy -Scheduled to see Dr Aundra Dubin on 5/4.  Will discuss with Advanced Heart Failure  Hypokalemia/Hypomagnesemia: continue to replete  Hyponatremia: Sodium 124 on admission, improving with diuresis.  129 this morning  Alcohol use: Previously was drinking at least 5 beers per night, which may be etiology of his cardiomyopathy.  He has cut back significantly, reported only 1 beer in the past week.  Will monitor for evidence of withdrawal   For questions or updates, please contact [place] Please consult [email or web address] for contact info under        Signed, Donato Heinz, MD  03/27/2020, 9:19 AM

## 2020-03-27 NOTE — Consult Note (Addendum)
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 Gardiner Rhyme, Cardiology.   64 y/o AAM w/ h/o prostate cancer, HTN, ETOH abuse (reports drinking, on average, 2 40oz beers day) and newly diagnosed heart failure, diagnosed 01/2020 after presenting to an urgent care w/ complaints of nausea, vomiting, cough and DOE. CXR and BNP c/w acute CHF. He was given Rx for Lasix and referred to general cardiology for further evaluation. Outpatient echo, 02/16/20, showed severely reduced LVEF, 20%, w/ global hypokinesis, G3DD and severe RV dysfunction. R/LHC on 3/19 showed elevated right and left heart pressures, mild pulmonary HTN and no CAD. CO was preserved. CO 7.54. CI 3.96. He was treated w/ IV Lasix then transitioned back to PO lasix.   He has been followed in outpatient cardiology clinic by Dr. Gardiner Rhyme and was tried on Entresto but did not tolerate due to hypotension. Later changed to losartan but this was also later stopped after developing an AKI. He has been able to tolerate low dose Toprol XL. Pt last seen in cardiology clinic 4/16 and referral to [facility] was placed. Appt scheduled for 5/4. Labs were also obtained at visit on 4/16 and he was found to have significantly elevated BNP at 3,024, w/ worsening hyponatremia and hypomagnesemia (1.2). He was contacted and recommended admission for IV diuretics, repletion of his magnesium and close inpatient monitoring of electrolytes w/ diuresis. He was admitted on 4/18.   He has been started on IV Lasix 60 mg bid and low dose spironolactone, 12.5 mg. PICC line placed. Co-ox marginal at 64%. BP stable, SBP range 112-138. Na 129. K  3.3. Mg 1.6. SCr 0.92. UOP decent, -3.5L out yesterday. CVP remains elevated, 18-low 20 range. No resting dyspnea. cMRI has been ordered. Not completed yet.   He is married w/ several children. 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      2D Echo 02/16/20 1. Left ventricular ejection fraction, by estimation, is <20%. The left ventricle has severely decreased function. The left ventricle demonstrates global hypokinesis. The left ventricular internal cavity size was severely dilated. Left ventricular diastolic parameters are consistent with Grade III diastolic dysfunction (restrictive). There is the interventricular septum is flattened in diastole ('D' shaped left ventricle), consistent with right ventricular volume overload. 2. Right ventricular systolic function is severely reduced. The right ventricular size is severely enlarged. There is normal pulmonary artery systolic pressure. 3. Left atrial size was severely dilated. 4. Right atrial size was severely dilated. 5. The mitral valve is normal in structure. Mild mitral valve regurgitation. No evidence of mitral stenosis. 6. Tricuspid valve regurgitation is mild to moderate. 7. The aortic valve is normal in structure. Aortic valve regurgitation is not visualized. No aortic stenosis is present.  [facility] 02/25/20 Conclusion    Hemodynamic findings consistent with mild pulmonary hypertension.   1. No angiographic evidence of CAD 2. Elevated right and left heart pressures.   Medical management of cardiomyopathy. Would recommend starting a beta blocker and Ace-inh/ARB as BP tolerates. Continue Lasix. Will give one dose of IV Lasix today. I will have him take lasix 40 mg po BID at  home for three days then return to 40 mg daily.      Review of Systems: [y] = yes, [ ]  = no   . General: Weight gain [ ] ; Weight loss [ ] ; Anorexia [ ] ; Fatigue [ ] ; Fever [ ] ; Chills [ ] ;  Weakness [ ]   . Cardiac: Chest pain/pressure [ ] ; Resting SOB [ ] ; Exertional SOB [ ] ; Orthopnea [ ] ; Pedal Edema [ ] ; Palpitations [ ] ; Syncope [ ] ; Presyncope [ ] ; Paroxysmal nocturnal dyspnea[ ]   . Pulmonary: Cough [ ] ; Wheezing[ ] ; Hemoptysis[ ] ; Sputum [ ] ; Snoring [ ]   . GI: Vomiting[ ] ; Dysphagia[ ] ; Melena[ ] ; Hematochezia [ ] ; Heartburn[ ] ; Abdominal pain [ ] ; Constipation [ ] ; Diarrhea [ ] ; BRBPR [ ]   . GU: Hematuria[ ] ; Dysuria [ ] ; Nocturia[ ]   . Vascular: Pain in legs with walking [ ] ; Pain in feet with lying flat [ ] ; Non-healing sores [ ] ; Stroke [ ] ; TIA [ ] ; Slurred speech [ ] ;  . Neuro: Headaches[ ] ; Vertigo[ ] ; Seizures[ ] ; Paresthesias[ ] ;Blurred vision [ ] ; Diplopia [ ] ; Vision changes [ ]   . Ortho/Skin: Arthritis [ ] ; Joint pain [ ] ; Muscle pain [ ] ; Joint swelling [ ] ; Back Pain [ ] ; Rash [ ]   . Psych: Depression[ ] ; Anxiety[ ]   . Heme: Bleeding problems [ ] ; Clotting disorders [ ] ; Anemia [ ]   . Endocrine: Diabetes [ ] ; Thyroid dysfunction[ ]   Home Medications Prior to Admission medications   Medication Sig Start Date End Date Taking? Authorizing Provider  furosemide (LASIX) 40 MG tablet Take 1 tablet (40 mg total) by mouth daily. 03/10/20  Yes Donato Heinz, MD  Magnesium Oxide 400 MG CAPS Take 1 capsule (400 mg total) by mouth 2 (two) times daily. 03/04/20  Yes Donato Heinz, MD  metoprolol succinate (TOPROL XL) 25 MG 24 hr tablet Take 0.5 tablets (12.5 mg total) by mouth daily. 03/17/20  Yes Donato Heinz, MD  Multiple Vitamin (MULTIVITAMIN) tablet Take 1 tablet by mouth daily.   Yes [provider]  potassium chloride SA (KLOR-CON M20) 20 MEQ tablet Take 1 tablet (20 mEq total) by mouth daily. 03/04/20  Yes Donato Heinz, MD    Past Medical History: Past Medical History:  Diagnosis Date  . Arthritis    hands  . GERD (gastroesophageal reflux disease)   . Hypertension   . Hypomagnesemia   . Hyponatremia   . Prostate  cancer [facility])     Past Surgical History: Past Surgical History:  Procedure Laterality Date  . LYMPHADENECTOMY Bilateral 01/26/2014   Procedure: LYMPHADENECTOMY WITH INDOCYANINE GREEN DYE;  Surgeon: Alexis Frock, MD;  Location: [facility];  Service: Urology;  Laterality: Bilateral;  . RIGHT/LEFT HEART CATH AND CORONARY ANGIOGRAPHY N/A 02/25/2020   Procedure: RIGHT/LEFT HEART CATH AND CORONARY ANGIOGRAPHY;  Surgeon: Burnell Blanks, MD;  Location: [facility];  Service: Cardiovascular;  Laterality: N/A;  . ROBOT ASSISTED LAPAROSCOPIC RADICAL PROSTATECTOMY N/A 01/26/2014   Procedure: ROBOTIC ASSISTED LAPAROSCOPIC RADICAL PROSTATECTOMY;  Surgeon: Alexis Frock, MD;  Location: [facility];  Service: Urology;  Laterality: N/A;  . SHOULDER SURGERY Left     Family History: Family History  Problem Relation Age of Onset  . Hypertension Mother   . Hypertension Father     Social History: Social History   Socioeconomic History  . Marital status: Married    Spouse name: Not on file  . Number of children: Not on file  .  Years of education: Not on file  . Highest education level: Not on file  Occupational History  . Not on file  Tobacco Use  . Smoking status: Never Smoker  . Smokeless tobacco: Never Used  Substance and Sexual Activity  . Alcohol use: Yes    Comment: 3 beers daily  . Drug use: No  . Sexual activity: Not on file  Other Topics Concern  . Not on file  Social History Narrative  . Not on file   Social Determinants of Health   Financial Resource Strain:   . Difficulty of Paying Living Expenses:   Food Insecurity:   . Worried About Charity fundraiser in the Last Year:   . Arboriculturist in the Last Year:   Transportation Needs:   . Film/video editor (Medical):   Marland Kitchen Lack of Transportation (Non-Medical):   Physical Activity:   . Days of Exercise per Week:   . Minutes of Exercise per Session:   Stress:   . Feeling of Stress :   Social Connections:   .  Frequency of Communication with Friends and Family:   . Frequency of Social Gatherings with Friends and Family:   . Attends Religious Services:   . Active Member of Clubs or Organizations:   . Attends Archivist Meetings:   Marland Kitchen Marital Status:     Allergies:  Allergies  Allergen Reactions  . Latex Swelling and Rash    Objective:    Vital Signs:   Temp:  [97.8 F (36.6 C)-98.5 F (36.9 C)] 97.8 F (36.6 C) (04/19 1149) Pulse Rate:  [95-107] 95 (04/19 0419) Resp:  [16] 16 (04/19 0700) BP: (112-135)/(74-99) 129/91 (04/19 0700) SpO2:  [98 %-100 %] 100 % (04/19 0700) Weight:  [72.6 kg] 72.6 kg (04/19 0419) Last BM Date: 03/26/20  Weight change: Filed Weights   03/26/20 0723 03/26/20 1505 03/27/20 0419  Weight: 74.8 kg 71.8 kg 72.6 kg    Intake/Output:   Intake/Output Summary (Last 24 hours) at 03/27/2020 1527 Last data filed at 03/27/2020 1151 Gross per 24 hour  Intake 360 ml  Output 2080 ml  Net -1720 ml      Physical Exam    CVP 18-low 20 range  General:  Middle aged AAM, dentition in poor repair. No resp difficulty HEENT: normal Neck: supple. Elevated JVD . Carotids 2+ bilat; no bruits. No lymphadenopathy or thyromegaly appreciated. Cor: PMI nondisplaced. Regular rate & rhythm. No rubs, gallops or murmurs. Lungs: clear, no wheezing  Abdomen: soft, nontender, nondistended. No hepatosplenomegaly. No bruits or masses. Good bowel sounds. Extremities: no cyanosis, clubbing, rash, edema Neuro: alert & orientedx3, cranial nerves grossly intact. moves all 4 extremities w/o difficulty. Affect pleasant   Telemetry   NSR 90s, no ventricular arrhythmias   EKG    Sinus tachycardia, LAE, 102 bpm, LVH   Labs   Basic Metabolic Panel: Recent Labs  Lab 03/24/20 0956 03/24/20 0956 03/26/20 0734 03/26/20 0825 03/26/20 1633 03/27/20 0954  NA 127*  --  124*  --  128* 129*  K 4.3  --  3.6  --  3.9 3.3*  CL 90*  --  93*  --  94* 96*  CO2 17*  --  20*  --   21* 23  GLUCOSE 82  --  100*  --  93 126*  BUN 8  --  7*  --  6* 7*  CREATININE 1.01  --  1.01  --  0.99 0.92  CALCIUM  9.2   < > 8.6*  --  9.1 8.7*  MG 1.2*  --   --  1.3* 1.6* 1.6*   < > = values in this interval not displayed.    Liver Function Tests: No results for input(s): AST, ALT, ALKPHOS, BILITOT, PROT, ALBUMIN in the last 168 hours. No results for input(s): LIPASE, AMYLASE in the last 168 hours. No results for input(s): AMMONIA in the last 168 hours.  CBC: Recent Labs  Lab 03/26/20 0734 03/27/20 0954  WBC 5.7 4.6  HGB 9.8* 9.9*  HCT 29.0* 30.1*  MCV 98.0 98.4  PLT 141* 146*    Cardiac Enzymes: No results for input(s): CKTOTAL, CKMB, CKMBINDEX, TROPONINI in the last 168 hours.  BNP: BNP (last 3 results) Recent Labs    02/16/20 1038 03/24/20 0956 03/26/20 0825  BNP 583.9* 3,023.7* 2,124.9*    ProBNP (last 3 results) No results for input(s): PROBNP in the last 8760 hours.   CBG: No results for input(s): GLUCAP in the last 168 hours.  Coagulation Studies: No results for input(s): LABPROT, INR in the last 72 hours.   Imaging   No results found.   Medications:     Current Medications: . Chlorhexidine Gluconate Cloth  6 each Topical Daily  . enoxaparin (LOVENOX) injection  40 mg Subcutaneous Q24H  . furosemide  40 mg Intravenous BID  . sodium chloride flush  3 mL Intravenous Once  . sodium chloride flush  3 mL Intravenous Q12H  . spironolactone  12.5 mg Oral Daily    Infusions: . sodium chloride       Assessment/Plan   1. Acute on Chronic Combined Systolic and Diastolic Heart Failure, W/ Biventricular Dysfunction:  - Diagnosed 01/2020. Echo 3/21 w/ severe biventricular failure. LVEF 123456, RV systolic function severely reduced. Also w/ grade 3 DD. [facility] 3/21 showed elevated right and left heart pressures, mild pulmonary HTN and no CAD c/w NICM. CO was preserved. CO 7.54. CI 3.96.  - ETOH abuse potential etiology. TSH normal. HIV test pending.  Plan cMRI to r/o infiltrative CM. Also recommend outpatient sleep study. ETOH cessation imperative  - needs further diuresis, does not appear grossly volume overloaded on exam but CVP elevated in the 18-low 20 range. BNP was elevated at 3000 on admit.  - may need inotropic support to push diuresis. Initial co-ox marginal at 64%.  - Has had difficulties tolerating GDMT due to hypotension and AKI. Failed both Entresto and Losartan.  - continue low dose spironolactone, 12.5 mg bid - add digoxin 0.125 mg - may retry low dose losartan 12.5 mg qhs - may need addition of midodrine for BP support  - not a good candidate for advanced therapies due to ETOH abuse. Not a candidate for VAD w/ RV failure.   2. Hyponatremia: 124 on admit, trending up now at 129. Mentation ok - suspect hypervolemic hyponatremia - continue diuresis per above  - monitor, can use tolvaptan as needed   - fluid restrict  3. Hypomagnesemia - Mg 1.6, likely 2/2 chronic ETOH abuse  - received 2 mg of MgSO4 - repeat Mg level in AM, may need scheduled Mag Oxide   4. Hypokalemia - K 3.3, secondary to IV diuresis + chronic hypomagnesemia - supp Mg per above + KCl supp  - continue spironolactone, 12.5 mg bid    5. Anemia: - chronic, at least since 2015, baseline Hgb 9-10  - may be 2/2 chronic ETOH use, MCV borderline at 98  - check Fe, Ferritin,  Folate and B-12   6. ETOH Abuse - prior to admit was drinking 2 40 oz beers a day - no signs of withdrawal at present  - discussed importance of quitting   Length of Stay: 1  Brittainy Simmons, PA-C  03/27/2020, 3:27 PM  Advanced Heart Failure Team Pager [phone] (M-F; 7a - 4p)  Please contact [facility] for night-coverage after hours (4p -7a ) and weekends on [email or web address]  Patient seen with PA, agree with the above note.   Patient has had exertional dyspnea since 1/21.  I reviewed his echo from 3/21, EF 20% with moderately decreased RV systolic function.  Cath with no  CAD, preserved cardiac output, elevated right>left heart filling pressures.  He drinks at least 2 40 oz beers/day.  Have had difficulty with volume management as outpatient, and have had difficulty getting him on GDMT due to hypotension and elevated creatinine.  Currently, SBP 110s-120s with creatinine 0.92.  He was admitted with ongoing exertional dyspnea.   PICC in place, CVP 18 with co-ox 64%.  He is diuresing reasonably well with IV Lasix 40 mg bid.   General: NAD Neck: JVP 14-16 cm, no thyromegaly or thyroid nodule.  Lungs: Clear to auscultation bilaterally with normal respiratory effort. CV: Lateral PMI.  Heart regular S1/S2, no S3/S4, no murmur.  No peripheral edema.   Abdomen: Soft, nontender, no hepatosplenomegaly, no distention.  Skin: Intact without lesions or rashes.  Neurologic: Alert and oriented x 3.  Psych: Normal affect. Extremities: No clubbing or cyanosis.  HEENT: Normal.   1. Acute on chronic systolic CHF: Nonischemic cardiomyopathy.  Echo from 3/21 was reviewed, showed EF 20% with moderately decreased RV systolic function by my read.  RHC/LHC from 3/21 showed no significant coronary disease, preserved cardiac output, R>L heart failure.  It is possible that this is ETOH cardiomyopathy, could also be myocarditis-related.  No definite FH of CHF.  CVP 18 today with co-ox 64%. Think RV failure is playing a significant role here. Volume overloaded on exam. Creatinine stable 0.92.  - Needs further diuresis, Lasix 80 mg IV bid.  - Continue spironolactone 12.5 daily.  - Re-try Entresto 24/26 bid, he appears to have BP room.  - He will need cardiac MRI to assess for infiltrative disease.  - Agree with digoxin 0.125.  - Add on LFTs (RV failure).  - Narrow QRS, so unlikely to be CRT candidate.  2. Hyponatremia: Hypovolemic hyponatremia, Na 129 today.  - Fluid restrict.  3. ETOH abuse: At least 2 40 oz beers/day.  Needs to cut back.   [name] 03/27/2020 5:14 PM

## 2020-03-28 ENCOUNTER — Inpatient Hospital Stay (HOSPITAL_COMMUNITY): Payer: 59

## 2020-03-28 ENCOUNTER — Encounter: Payer: Self-pay | Admitting: *Deleted

## 2020-03-28 DIAGNOSIS — I5023 Acute on chronic systolic (congestive) heart failure: Secondary | ICD-10-CM

## 2020-03-28 DIAGNOSIS — Z006 Encounter for examination for normal comparison and control in clinical research program: Secondary | ICD-10-CM

## 2020-03-28 LAB — HEPATIC FUNCTION PANEL
ALT: 17 U/L (ref 0–44)
AST: 28 U/L (ref 15–41)
Albumin: 3.2 g/dL — ABNORMAL LOW (ref 3.5–5.0)
Alkaline Phosphatase: 89 U/L (ref 38–126)
Bilirubin, Direct: 0.5 mg/dL — ABNORMAL HIGH (ref 0.0–0.2)
Indirect Bilirubin: 1.1 mg/dL — ABNORMAL HIGH (ref 0.3–0.9)
Total Bilirubin: 1.6 mg/dL — ABNORMAL HIGH (ref 0.3–1.2)
Total Protein: 7.2 g/dL (ref 6.5–8.1)

## 2020-03-28 LAB — CBC
HCT: 33.2 % — ABNORMAL LOW (ref 39.0–52.0)
Hemoglobin: 11 g/dL — ABNORMAL LOW (ref 13.0–17.0)
MCH: 32.4 pg (ref 26.0–34.0)
MCHC: 33.1 g/dL (ref 30.0–36.0)
MCV: 97.9 fL (ref 80.0–100.0)
Platelets: 156 10*3/uL (ref 150–400)
RBC: 3.39 MIL/uL — ABNORMAL LOW (ref 4.22–5.81)
RDW: 22 % — ABNORMAL HIGH (ref 11.5–15.5)
WBC: 5 10*3/uL (ref 4.0–10.5)
nRBC: 0 % (ref 0.0–0.2)

## 2020-03-28 LAB — HIV ANTIBODY (ROUTINE TESTING W REFLEX): HIV Screen 4th Generation wRfx: NONREACTIVE

## 2020-03-28 LAB — FOLATE RBC
Folate, Hemolysate: 454 ng/mL
Folate, RBC: 1469 ng/mL (ref >498)
Hematocrit: 30.9 % — ABNORMAL LOW (ref 37.5–51.0)

## 2020-03-28 LAB — COOXEMETRY PANEL
Carboxyhemoglobin: 1.1 % (ref 0.5–1.5)
Carboxyhemoglobin: 1.7 % — ABNORMAL HIGH (ref 0.5–1.5)
Methemoglobin: 0.6 % (ref 0.0–1.5)
Methemoglobin: 0.9 % (ref 0.0–1.5)
O2 Saturation: 47.8 %
O2 Saturation: 60.3 %
Total hemoglobin: 11 g/dL — ABNORMAL LOW (ref 12.0–16.0)
Total hemoglobin: 12.3 g/dL (ref 12.0–16.0)

## 2020-03-28 LAB — BASIC METABOLIC PANEL
Anion gap: 9 (ref 5–15)
BUN: 7 mg/dL — ABNORMAL LOW (ref 8–23)
CO2: 23 mmol/L (ref 22–32)
Calcium: 8.8 mg/dL — ABNORMAL LOW (ref 8.9–10.3)
Chloride: 98 mmol/L (ref 98–111)
Creatinine, Ser: 0.93 mg/dL (ref 0.61–1.24)
GFR calc Af Amer: >60 mL/min (ref >60)
GFR calc non Af Amer: >60 mL/min (ref >60)
Glucose, Bld: 106 mg/dL — ABNORMAL HIGH (ref 70–99)
Potassium: 4.2 mmol/L (ref 3.5–5.1)
Sodium: 130 mmol/L — ABNORMAL LOW (ref 135–145)

## 2020-03-28 LAB — MAGNESIUM: Magnesium: 1.5 mg/dL — ABNORMAL LOW (ref 1.7–2.4)

## 2020-03-28 MED ORDER — SPIRONOLACTONE 25 MG PO TABS: 25.0000 mg | ORAL_TABLET | Freq: Every day | ORAL | Status: DC

## 2020-03-28 MED ORDER — TORSEMIDE 20 MG PO TABS: 40.0000 mg | ORAL_TABLET | Freq: Every day | ORAL | 5 refills | Status: DC

## 2020-03-28 MED ORDER — SODIUM CHLORIDE 0.9 % IV SOLN
510.0000 mg | INTRAVENOUS | Status: DC
  Administered 2020-03-28: 12:00:00 510 mg via INTRAVENOUS
  Filled 2020-03-28: qty 17

## 2020-03-28 MED ORDER — SACUBITRIL-VALSARTAN 24-26 MG PO TABS: 1.0000 | ORAL_TABLET | Freq: Two times a day (BID) | ORAL | 5 refills | Status: DC

## 2020-03-28 MED ORDER — MAGNESIUM SULFATE 4 GM/100ML IV SOLN
4.0000 g | Freq: Once | INTRAVENOUS | Status: AC
  Administered 2020-03-28: 10:00:00 4 g via INTRAVENOUS
  Filled 2020-03-28: qty 100

## 2020-03-28 MED ORDER — DIGOXIN 125 MCG PO TABS: 0.1250 mg | ORAL_TABLET | Freq: Every day | ORAL | 5 refills | Status: DC

## 2020-03-28 MED ORDER — SPIRONOLACTONE 25 MG PO TABS: 25.0000 mg | ORAL_TABLET | Freq: Every day | ORAL | 5 refills | Status: DC

## 2020-03-28 MED ORDER — TORSEMIDE 20 MG PO TABS
40.0000 mg | ORAL_TABLET | Freq: Every day | ORAL | Status: DC
  Administered 2020-03-28: 40 mg via ORAL
  Filled 2020-03-28: qty 2

## 2020-03-28 MED ORDER — GADOBUTROL 1 MMOL/ML IV SOLN
9.0000 mL | Freq: Once | INTRAVENOUS | Status: AC | PRN
  Administered 2020-03-28: 15:00:00 9 mL via INTRAVENOUS

## 2020-03-28 NOTE — TOC Progression Note (Signed)
Transition of Care [facility]) - Progression Note    Patient Details  Name: Austin [name] MRN: [id number] Date of Birth: Mar 05, 1956  Transition of Care [facility]) CM/SW Contact  Zenon Mayo, RN Phone Number: 03/28/2020, 4:15 PM  Clinical Narrative:    NCM spoke with patient , asked if he wanted a [facility] for CHF disease management , he states no his wife will be with him.         Expected Discharge Plan and Services           Expected Discharge Date: 03/28/20                                     Social Determinants of Health (SDOH) Interventions    Readmission Risk Interventions No flowsheet data found.

## 2020-03-28 NOTE — Discharge Summary (Signed)
Advanced Heart Failure Team  Discharge Summary   Patient ID: Austin [name] MRN: [id number], DOB/AGE: 1956/04/09 64 y.o. Admit date: 03/26/2020 D/C date:     03/28/2020   Primary Discharge Diagnoses:  Acute on Chronic Combined Systolic and Diastolic Heart Failure w/ Biventricular Dysfunction ETOH abuse Iron Deficiency Anemia  Hypomagnesemia  Hyponatremia   Hospital Course:   64 y/o AAM w/ h/o prostate cancer, HTN, ETOH abuse (reports drinking, on average, 2 40oz beers day) and newly diagnosed heart failure, diagnosed 01/2020 after presenting to an urgent care w/ complaints of nausea, vomiting, cough and DOE. CXR and BNP c/w acute CHF. He was given Rx for Lasix and referred to general cardiology for further evaluation. Outpatient echo, 02/16/20, showed severely reduced LVEF, 20%, w/ global hypokinesis, G3DD and severe RV dysfunction. R/LHC on 3/19 showed elevated right and left heart pressures, mild pulmonary HTN and no CAD. CO was preserved. CO 7.54. CI 3.96. He was treated w/ IV Lasix then transitioned back to PO lasix.   He has been followed in outpatient cardiology clinic by Dr. Gardiner Rhyme and was tried on Entresto but did not tolerate due to hypotension. Later changed to losartan but this was also later stopped after developing an AKI. He has been able to tolerate low dose Toprol XL. Pt last seen in cardiology clinic 4/16 and referral to [facility] was placed. Appt scheduled for 5/4. Labs were also obtained at visit on 4/16 and he was found to have significantly elevated BNP at 3,024, w/ worsening hyponatremia and hypomagnesemia (1.2). He was contacted and recommended admission for IV diuretics, repletion of his magnesium and close inpatient monitoring of electrolytes w/ diuresis. He was admitted on 4/18.   He was started on IV Lasix and low dose spironolactone, 12.5 mg. PICC line placed. Co-ox marginal at 64%. BP stable, SBP range 112-138. Na 129. K 3.3. Mg 1.6. SCr 0.92. CVP elevated, 18-low 20  range. AHF was consulted to assist w/ further management.   His lasix was increased to 80 mg bid. He was retried on low dose Entresto, 24-26 bid, and tolerated well. Digoxin 0.125 mg was also added to regimen. He had excellent diuresis. Overall, diuresed 14 lb and CVP improved to 6. Renal function and BP remained stable. No orthostatic symptoms. He was transitioned off of IV lasix and placed on 40 mg PO torsemide. Spironolactone was further increased to 25 mg. Co-ox remained stable at 60%. Cardiac MRI was performed prior to discharge. Results pending (will address results at clinic f/u).   His electrolytes were repleted. He also was found to have IDA. Appears chronic. Baseline hgb has been 9-10 since 2015. Fe was low at 27. He was given a dose of IV Fe and will need to f/u with his PCP post discharge for further w/u.   On 4/20, he was seen and examined by Dr. Aundra Dubin and felt stable for discharge home. AHF clinic f/u arranged on 5/4.   Detailed Hospital Problem List  1. Acute on chronic systolic CHF: Nonischemic cardiomyopathy. Echo from 3/21 was reviewed, showed EF 20% with moderately decreased RV systolic function by my read. RHC/LHC from 3/21 showed no significant coronary disease, preserved cardiac output, R>L heart failure. It is possible that this is ETOH cardiomyopathy, could also be myocarditis-related. No definite FH of CHF. Initial CVP was 18 with co-ox 64%. Think RV failure is playing a significant role here. HFTs ok.  - He has responded nicely to IV Lasix + retrial of Entresto. -2.5L in UOP  yesterday. Wt down additional 9 lb (14 lb total). CVP down to 6 today. SCr stable. Co-ox however low at 48%. Will repeat STAT Co-ox.  - Stop IV Lasix and transition to torsemide 40 mg daily.  - Increase spironolactone to 25 mg daily.  - Continue Entresto 24/26 bid. BP ok today. No dizziness.   - Plan cardiac MRI to assess for infiltrative disease (Dr. Gardiner Rhyme to read) - Continue digoxin 0.125.  -  Narrow QRS, so unlikely to be CRT candidate. ? Barostim  2. Hyponatremia: Hypovolemic hyponatremia, Na 130 today. Mentating ok - Fluid restrict.  3. ETOH abuse: At least 2 40 oz beers/day. Needs to cut back.  4. Hypomagnesemia: 1.5 today. Likely 2/2 ETOH use - give 4 g IV MgSO4  5. Anemia:  - chronic, at least since 2015, baseline Hgb 9-10  - may be 2/2 chronic ETOH use, MCV borderline at 98  - B12 level nl. Folate pending - Fe low at 27, Ferritin 79. Will give a dose of feraheme    Discharge Weight Range: 151 lb  Discharge Vitals: Blood pressure 96/71, pulse 95, temperature 97.7 F (36.5 C), temperature source Oral, resp. rate 17, height 5' 9.5" (1.765 m), weight 68.6 kg, SpO2 98 %.  Labs: Lab Results  Component Value Date   WBC 5.0 03/28/2020   HGB 11.0 (L) 03/28/2020   HCT 33.2 (L) 03/28/2020   MCV 97.9 03/28/2020   PLT 156 03/28/2020    Recent Labs  Lab 03/28/20 0440  NA 130*  K 4.2  CL 98  CO2 23  BUN 7*  CREATININE 0.93  CALCIUM 8.8*  PROT 7.2  BILITOT 1.6*  ALKPHOS 89  ALT 17  AST 28  GLUCOSE 106*   Lab Results  Component Value Date   CHOL 125 02/22/2020   HDL 78 02/22/2020   LDLCALC 28 02/22/2020   TRIG 103 02/22/2020   BNP (last 3 results) Recent Labs    02/16/20 1038 03/24/20 0956 03/26/20 0825  BNP 583.9* 3,023.7* 2,124.9*    ProBNP (last 3 results) No results for input(s): PROBNP in the last 8760 hours.   Diagnostic Studies/Procedures   No results found.  Discharge Medications   Allergies as of 03/28/2020      Reactions   Latex Swelling, Rash      Medication List    STOP taking these medications   furosemide 40 MG tablet Commonly known as: LASIX   metoprolol succinate 25 MG 24 hr tablet Commonly known as: Toprol XL   potassium chloride SA 20 MEQ tablet Commonly known as: Klor-Con M20     TAKE these medications   digoxin 0.125 MG tablet Commonly known as: LANOXIN Take 1 tablet (0.125 mg total) by mouth  daily. Start taking on: March 29, 2020   Magnesium Oxide 400 MG Caps Take 1 capsule (400 mg total) by mouth 2 (two) times daily.   multivitamin tablet Take 1 tablet by mouth daily.   sacubitril-valsartan 24-26 MG Commonly known as: ENTRESTO Take 1 tablet by mouth 2 (two) times daily.   spironolactone 25 MG tablet Commonly known as: ALDACTONE Take 1 tablet (25 mg total) by mouth daily. Start taking on: March 29, 2020   torsemide 20 MG tablet Commonly known as: DEMADEX Take 2 tablets (40 mg total) by mouth daily. Start taking on: March 29, 2020       Disposition   The patient will be discharged in stable condition to home.  Follow-up Information    [name]  S, MD Follow up on 04/11/2020.   Specialty: Cardiology Why: Advanced Heart Failure Clinic 11:40 AM  Parking Garage Code 5008 Contact information: [place] [address] [phone]        [name], MD. Call.   Specialty: Internal Medicine Why: Follow-up for anemia  Contact information: [address] [place] [address] [phone]        Donato Heinz, MD Follow up on 04/06/2020.   Specialty: Cardiology Why: 9:00 AM  Contact information: [address] [place] [address] [phone]             Duration of Discharge Encounter: Greater than 35 minutes   Signed, Nelida Gores 03/28/2020, 2:33 PM

## 2020-03-28 NOTE — Research (Signed)
Spoke with patient re: the [employer] research study.  He is not interested today.

## 2020-03-28 NOTE — Progress Notes (Addendum)
Advanced Heart Failure Rounding Note  PCP-Cardiologist: Donato Heinz, MD   Subjective:    Feels well today. Sitting up on side of bed. No dyspnea. Reports good UOP yesterday. -2.5L charted. Wt down an additional 9 lb (14 lb total since admit). CVP down to 6 today. SCr stable.    BP stable w/ Entresto. Denies dizziness.   K 4.0 Mg 1.5  HFTs ok. AST, ALT normal. Total bili 1.6  Co-ox low at 48%. Will repeat STAT Co-ox.    Objective:   Weight Range: 68.6 kg Body mass index is 22.02 kg/m.   Vital Signs:   Temp:  [97.5 F (36.4 C)-98.5 F (36.9 C)] 97.8 F (36.6 C) (04/20 0420) Pulse Rate:  [90-105] 95 (04/20 0420) Resp:  [16-18] 17 (04/20 0420) BP: (94-109)/(71-86) 96/71 (04/20 0420) SpO2:  [97 %-100 %] 98 % (04/20 0420) Weight:  [68.6 kg] 68.6 kg (04/20 0420) Last BM Date: 03/27/20  Weight change: Filed Weights   03/26/20 1505 03/27/20 0419 03/28/20 0420  Weight: 71.8 kg 72.6 kg 68.6 kg    Intake/Output:   Intake/Output Summary (Last 24 hours) at 03/28/2020 0857 Last data filed at 03/28/2020 [id number] Gross per 24 hour  Intake 480 ml  Output 2700 ml  Net -2220 ml      Physical Exam    CVP 6 General:  Middle aged AAM. No resp difficulty HEENT: Normal Neck: Supple. JVP . Carotids 2+ bilat; no bruits. No lymphadenopathy or thyromegaly appreciated. Cor: PMI nondisplaced. Regular rate & rhythm. No rubs, gallops or murmurs. Lungs: Clear, no wheezing  Abdomen: Soft, nontender, nondistended. No hepatosplenomegaly. No bruits or masses. Good bowel sounds. Extremities: No cyanosis, clubbing, rash, edema Neuro: Alert & orientedx3, cranial nerves grossly intact. moves all 4 extremities w/o difficulty. Affect pleasant   Telemetry   NSR 90s, no arrhthymias    Labs    CBC Recent Labs    03/27/20 0954 03/28/20 0440  WBC 4.6 5.0  HGB 9.9* 11.0*  HCT 30.1* 33.2*  MCV 98.4 97.9  PLT 146* [id number]   Basic Metabolic Panel Recent Labs    03/27/20 0954  03/28/20 0440  NA 129* 130*  K 3.3* 4.2  CL 96* 98  CO2 23 23  GLUCOSE 126* 106*  BUN 7* 7*  CREATININE 0.92 0.93  CALCIUM 8.7* 8.8*  MG 1.6* 1.5*   Liver Function Tests Recent Labs    03/28/20 0440  AST 28  ALT 17  ALKPHOS 89  BILITOT 1.6*  PROT 7.2  ALBUMIN 3.2*   No results for input(s): LIPASE, AMYLASE in the last 72 hours. Cardiac Enzymes No results for input(s): CKTOTAL, CKMB, CKMBINDEX, TROPONINI in the last 72 hours.  BNP: BNP (last 3 results) Recent Labs    02/16/20 1038 03/24/20 0956 03/26/20 0825  BNP 583.9* 3,023.7* 2,124.9*    ProBNP (last 3 results) No results for input(s): PROBNP in the last 8760 hours.   D-Dimer No results for input(s): DDIMER in the last 72 hours. Hemoglobin A1C No results for input(s): HGBA1C in the last 72 hours. Fasting Lipid Panel No results for input(s): CHOL, HDL, LDLCALC, TRIG, CHOLHDL, LDLDIRECT in the last 72 hours. Thyroid Function Tests Recent Labs    03/27/20 0956  TSH 0.936    Other results:   Imaging     No results found.   Medications:     Scheduled Medications: . Chlorhexidine Gluconate Cloth  6 each Topical Daily  . digoxin  0.125 mg Oral Daily  .  enoxaparin (LOVENOX) injection  40 mg Subcutaneous Q24H  . furosemide  80 mg Intravenous BID  . sacubitril-valsartan  1 tablet Oral BID  . sodium chloride flush  3 mL Intravenous Once  . sodium chloride flush  3 mL Intravenous Q12H  . spironolactone  12.5 mg Oral Daily     Infusions: . sodium chloride    . magnesium sulfate bolus IVPB       PRN Medications:  sodium chloride, acetaminophen, ondansetron (ZOFRAN) IV, sodium chloride flush, sodium chloride flush    Assessment/Plan   1. Acute on chronic systolic CHF: Nonischemic cardiomyopathy.  Echo from 3/21 was reviewed, showed EF 20% with moderately decreased RV systolic function by my read.  RHC/LHC from 3/21 showed no significant coronary disease, preserved cardiac output, R>L  heart failure.  It is possible that this is ETOH cardiomyopathy, could also be myocarditis-related.  No definite FH of CHF.  Initial CVP was 18 with co-ox 64%. Think RV failure is playing a significant role here. HFTs ok.  - He has responded nicely to IV Lasix + retrial of Entresto. -2.5L in UOP yesterday. Wt down additional 9 lb (14 lb total). CVP down to 6 today. SCr stable. Co-ox however low at 48%. Will repeat STAT Co-ox.  - Stop IV Lasix and transition to torsemide 40 mg daily.  - Increase spironolactone to 25 mg daily.  - Continue Entresto 24/26 bid. BP ok today. No dizziness.   - Plan cardiac MRI to assess for infiltrative disease (Dr. Gardiner Rhyme to read) - Continue digoxin 0.125.  - Narrow QRS, so unlikely to be CRT candidate. ? Barostim  2. Hyponatremia: Hypovolemic hyponatremia, Na 130 today. Mentating ok - Fluid restrict.  3. ETOH abuse: At least 2 40 oz beers/day.  Needs to cut back.  4. Hypomagnesemia: 1.5 today. Likely 2/2 ETOH use - give 4 g IV MgSO4  5. Anemia:  - chronic, at least since 2015, baseline Hgb 9-10  - may be 2/2 chronic ETOH use, MCV borderline at 98  - B12 level nl. Folate pending - Fe low at 27, Ferritin 79. Will give a dose of feraheme    If repeat co-ox normal, could be discharged home later this afternoon, after cardiac MRI. If low co-ox, <55%, will need inotrope. We will f/u on result of co-ox and will leave final recs if stable for d/c.   He has f/u arranged in [facility] on 5/4. Will place appt info in AVS.    Length of Stay: 2  Lyda Jester, PA-C  03/28/2020, 8:57 AM   Patient seen with PA, agree with the above note.   Repeat co-ox today was 60%.  CVP down to 6.    On exam, he is not volume overloaded.   Stop IV Lasix today, start torsemide 40 mg daily. Increase spironolactone to 25 mg daily.  Continue Entresto and digoxin.  I think that he can go home later today after his cardiac MRI.   [name] 03/28/2020 1:42 PM  Advanced Heart  Failure Team Pager [phone] (M-F; 7a - 4p)  Please contact [facility] for night-coverage after hours (4p -7a ) and weekends on [email or web address]

## 2020-03-28 NOTE — Progress Notes (Signed)
PICC removed per order.  Vaseline gauze and 4x4 gauze pressure dressing applied with manual pressure held x5 minutes.  No bleeding post removal, patient and wife aware to leave dressing on for 24 hours.  Follow up with MD for s/s infection, return to ER if bleeding from site and unable to control with manual pressure.  Verbalized understanding.

## 2020-03-29 ENCOUNTER — Telehealth: Payer: Self-pay

## 2020-03-29 NOTE — Telephone Encounter (Signed)
Transition Care Management Follow-up Telephone Call  Date of discharge and from where: 03/28/2020 from [facility]  How have you been since you were released from the hospital? Sleeping per spouse  Any questions or concerns? No   Items Reviewed:  Did the pt receive and understand the discharge instructions provided? Yes   Medications obtained and verified? Yes   Any new allergies since your discharge? No   Dietary orders reviewed? No  Do you have support at home? Yes   Other (ie: DME, Home Health, etc) n/a  Functional Questionnaire: (I = Independent and D = Dependent) ADL's: I  Bathing/Dressing- I   Meal Prep- I  Eating- I  Maintaining continence- I  Transferring/Ambulation- I  Managing Meds- I   Follow up appointments reviewed:    PCP Hospital f/u appt confirmed? Yes  Scheduled to see [name] FNP on 04/03/2020 @ 3:00..  Are transportation arrangements needed? No   If their condition worsens, is the pt aware to call  their PCP or go to the ED? Yes  Was the patient provided with contact information for the PCP's office or ED? Yes  Was the pt encouraged to call back with questions or concerns? Yes

## 2020-04-03 ENCOUNTER — Encounter: Payer: Self-pay | Admitting: Nurse Practitioner

## 2020-04-03 ENCOUNTER — Ambulatory Visit (INDEPENDENT_AMBULATORY_CARE_PROVIDER_SITE_OTHER): Payer: 59 | Admitting: Nurse Practitioner

## 2020-04-03 ENCOUNTER — Other Ambulatory Visit: Payer: Self-pay

## 2020-04-03 ENCOUNTER — Ambulatory Visit: Payer: 59 | Admitting: Nurse Practitioner

## 2020-04-03 VITALS — BP 112/72 | HR 101 | Temp 97.7°F | Ht 67.6 in | Wt 151.4 lb

## 2020-04-03 DIAGNOSIS — D509 Iron deficiency anemia, unspecified: Secondary | ICD-10-CM | POA: Diagnosis not present

## 2020-04-03 DIAGNOSIS — I5043 Acute on chronic combined systolic (congestive) and diastolic (congestive) heart failure: Secondary | ICD-10-CM | POA: Diagnosis not present

## 2020-04-03 MED ORDER — FERROUS SULFATE 325 (65 FE) MG PO TABS: 325.0000 mg | ORAL_TABLET | Freq: Every day | ORAL | 3 refills | Status: DC

## 2020-04-03 NOTE — Progress Notes (Signed)
This visit occurred during the SARS-CoV-2 public health emergency.  Safety protocols were in place, including screening questions prior to the visit, additional usage of staff PPE, and extensive cleaning of exam room while observing appropriate contact time as indicated for disinfecting solutions.  Subjective:     Patient ID: Austin [name] , male    DOB: 07/15/1956 , 64 y.o.   MRN: [id number]   Chief Complaint  Patient presents with  . Hospitalization Follow-up    patient has been doing ok silnce leaving the hospital     HPI  Here for follow up from hospitalization. 4/18 - 4/20 he had labs done on 4/16 which showed significantly elevated BNP at 3,024, w/ worsening hyponatremia and hypomagnesemia (1.2). He was contacted and recommended admission for IV diuretics, repletion of his magnesium and close inpatient monitoring of electrolytes w/ diuresis. He was started on IV Lasix and low dose spironolatone, 12.5mg .  He was tried again on low dose Entresto, 24-26 bid and tolerated well.  Digoxin 0.125 mg was also added with good results with diuresis.  Overall he diuresed 14 lbs and his CVP improved to 6.  He is now on torsemide 40 mg PO and his Spironolactone was further increased to 25 mg. He also has iron deficiency anemia which appeared to be chronic. He was given a dose of IV iron and placed on oral supplementation at discharge.   He is not requiring home health at this time but is currently out of work  He was unable to get the entresto due to cost.    Wt Readings from Last 3 Encounters: 04/03/20 : 151 lb 6.4 oz (68.7 kg) 03/28/20 : 151 lb 4.8 oz (68.6 kg) 03/24/20 : 167 lb (75.8 kg)     Past Medical History:  Diagnosis Date  . Arthritis    hands  . GERD (gastroesophageal reflux disease)   . Hypertension   . Hypomagnesemia   . Hyponatremia   . Prostate cancer [facility])      Family History  Problem Relation Age of Onset  . Hypertension Mother   . Hypertension Father       Current Outpatient Medications:  .  digoxin (LANOXIN) 0.125 MG tablet, Take 1 tablet (0.125 mg total) by mouth daily., Disp: 30 tablet, Rfl: 5 .  Magnesium Oxide 400 MG CAPS, Take 1 capsule (400 mg total) by mouth 2 (two) times daily., Disp: 180 capsule, Rfl: 3 .  Multiple Vitamin (MULTIVITAMIN) tablet, Take 1 tablet by mouth daily., Disp: , Rfl:  .  sacubitril-valsartan (ENTRESTO) 24-26 MG, Take 1 tablet by mouth 2 (two) times daily., Disp: 60 tablet, Rfl: 5 .  spironolactone (ALDACTONE) 25 MG tablet, Take 1 tablet (25 mg total) by mouth daily., Disp: 30 tablet, Rfl: 5 .  torsemide (DEMADEX) 20 MG tablet, Take 2 tablets (40 mg total) by mouth daily., Disp: 60 tablet, Rfl: 5   Allergies  Allergen Reactions  . Latex Swelling and Rash     Review of Systems  Constitutional: Negative.   Respiratory: Negative.   Cardiovascular: Negative for chest pain, palpitations and leg swelling.  Neurological: Negative.   Psychiatric/Behavioral: Negative.      Today's Vitals   04/03/20 1527  BP: 112/72  Pulse: (!) 101  Temp: 97.7 F (36.5 C)  TempSrc: Oral  Weight: 151 lb 6.4 oz (68.7 kg)  Height: 5' 7.6" (1.717 m)  PainSc: 0-No pain   Body mass index is 23.29 kg/m.   Objective:  Physical Exam Vitals reviewed.  Constitutional:      General: He is not in acute distress.    Appearance: Normal appearance.  Cardiovascular:     Rate and Rhythm: Normal rate and regular rhythm.     Pulses: Normal pulses.     Heart sounds: Normal heart sounds. No murmur heard.   Pulmonary:     Effort: Pulmonary effort is normal. No respiratory distress.     Breath sounds: Normal breath sounds.  Neurological:     General: No focal deficit present.     Mental Status: He is alert and oriented to person, place, and time.     Cranial Nerves: No cranial nerve deficit.  Psychiatric:        Mood and Affect: Mood normal.        Behavior: Behavior normal.        Thought Content: Thought content normal.          Assessment And Plan:     1. Acute on chronic combined systolic (congestive) and diastolic (congestive) heart failure ([place])  TCM Performed. A member of the clinical team spoke with the patient upon dischare. Discharge summary was reviewed in full detail during the visit. Meds reconciled and compared to discharge meds. Medication list is updated and reviewed with the patient.  Greater than 50% face to face time was spent in counseling an coordination of care.  All questions were answered to the satisfaction of the patient.    Started on torsemide and spironalactone, continued with entresto  2. Iron deficiency anemia, unspecified iron deficiency anemia type  Iron infusion given, he is to continue with oral iron supplement and we will recheck his levels in 1-2 months.        [name], FNP    THE PATIENT IS ENCOURAGED TO PRACTICE SOCIAL DISTANCING DUE TO THE COVID-19 PANDEMIC.

## 2020-04-04 ENCOUNTER — Encounter: Payer: Self-pay | Admitting: Nurse Practitioner

## 2020-04-06 ENCOUNTER — Ambulatory Visit: Payer: 59 | Admitting: Cardiology

## 2020-04-11 ENCOUNTER — Telehealth (HOSPITAL_COMMUNITY): Payer: Self-pay | Admitting: Pharmacist

## 2020-04-11 ENCOUNTER — Other Ambulatory Visit: Payer: Self-pay

## 2020-04-11 ENCOUNTER — Ambulatory Visit (HOSPITAL_COMMUNITY)
Admission: RE | Admit: 2020-04-11 | Discharge: 2020-04-11 | Disposition: A | Discharge status: Home / Self Care | Payer: 59 | Source: Ambulatory Visit | Attending: Cardiology | Admitting: Cardiology

## 2020-04-11 ENCOUNTER — Encounter (HOSPITAL_COMMUNITY): Payer: 59 | Admitting: Cardiology

## 2020-04-11 ENCOUNTER — Encounter (HOSPITAL_COMMUNITY): Payer: Self-pay | Admitting: Cardiology

## 2020-04-11 VITALS — BP 84/60 | HR 88 | Wt 155.4 lb

## 2020-04-11 DIAGNOSIS — I5022 Chronic systolic (congestive) heart failure: Secondary | ICD-10-CM | POA: Diagnosis present

## 2020-04-11 DIAGNOSIS — I110 Hypertensive heart disease with heart failure: Secondary | ICD-10-CM | POA: Insufficient documentation

## 2020-04-11 DIAGNOSIS — I959 Hypotension, unspecified: Secondary | ICD-10-CM | POA: Diagnosis not present

## 2020-04-11 DIAGNOSIS — Z79899 Other long term (current) drug therapy: Secondary | ICD-10-CM | POA: Diagnosis not present

## 2020-04-11 DIAGNOSIS — F1010 Alcohol abuse, uncomplicated: Secondary | ICD-10-CM | POA: Diagnosis not present

## 2020-04-11 DIAGNOSIS — Z8249 Family history of ischemic heart disease and other diseases of the circulatory system: Secondary | ICD-10-CM | POA: Diagnosis not present

## 2020-04-11 DIAGNOSIS — I428 Other cardiomyopathies: Secondary | ICD-10-CM | POA: Diagnosis not present

## 2020-04-11 LAB — BASIC METABOLIC PANEL
Anion gap: 14 (ref 5–15)
BUN: 29 mg/dL — ABNORMAL HIGH (ref 8–23)
CO2: 23 mmol/L (ref 22–32)
Calcium: 9.2 mg/dL (ref 8.9–10.3)
Chloride: 90 mmol/L — ABNORMAL LOW (ref 98–111)
Creatinine, Ser: 1.07 mg/dL (ref 0.61–1.24)
GFR calc Af Amer: >60 mL/min (ref >60)
GFR calc non Af Amer: >60 mL/min (ref >60)
Glucose, Bld: 90 mg/dL (ref 70–99)
Potassium: 3.9 mmol/L (ref 3.5–5.1)
Sodium: 127 mmol/L — ABNORMAL LOW (ref 135–145)

## 2020-04-11 LAB — DIGOXIN LEVEL: Digoxin Level: 0.6 ng/mL — ABNORMAL LOW (ref 0.8–2.0)

## 2020-04-11 MED ORDER — EMPAGLIFLOZIN 10 MG PO TABS: 10.0000 mg | ORAL_TABLET | Freq: Every day | ORAL | 5 refills | Status: DC

## 2020-04-11 MED ORDER — TORSEMIDE 20 MG PO TABS: 20.0000 mg | ORAL_TABLET | Freq: Every day | ORAL | 5 refills | Status: DC

## 2020-04-11 NOTE — Progress Notes (Signed)
Medication Samples have been provided to the patient.  Drug name: Austin [name]       Strength: 24/26mg         Qty: 28  LOT: [id number]  Exp.Date: 8/22  Dosing instructions: 1 tab twice a day  The patient has been instructed regarding the correct time, dose, and frequency of taking this medication, including desired effects and most common side effects.   Austin [name] 12:20 PM 04/11/2020

## 2020-04-11 NOTE — Progress Notes (Signed)
PCP: [name], MD Cardiology: Dr. Gardiner Rhyme HF Cardiology: Dr. Aundra Dubin  64 y.o. with history of ETOH abuse and prior HTN returns for followup of nonischemic cardiomyopathy. Patient had no known prior cardiac history.  In 2/21, he developed exertional dyspnea and was noted at an urgent care center to have CHF.  Echo in 3/21 showed EF < 20% with severe RV dysfunction.  RHC/LHC in 3/21 showed no CAD but R > L filling pressure elevation and preserved cardiac index.  He was admitted in 4/21 with acute/chronic systolic CHF.  He was diuresed and cardiac meds were adjusted.  Cardiac MRI this admission showed EF 14%, moderate LV dilation, severely decreased RV function, and moderate pericardial effusion. There was no evidence for myocarditis noted.   Patient, of note, has a history of heavy ETOH intake for decades. Per his wife, he would drink heavily every day after getting home from work and especially heavily on the weekend. He has a history of ETOH withdrawal.  He does have some family history of CHF (sister, maybe his mother), but he does not have a lot of detail about these diagnoses. No drugs (cocaine, amphetamines).   Patient has been feeling good since discharge.  BP is low but he denies lightheadedness.  He has mostly quit drinking now, this is corroborated by his wife. No dyspnea walking on flat ground now and able to walk up a flight of stairs.  Orthopnea has resolved.  Better appetite.  ECG (personally reviewed): NSR, LVH with repolarization.   Labs (4/21): HIV negative, K 4.2, creatinine 0.93  PMH: 1. Prostate cancer 2. HTN: BP now low.  3. Fe deficiency anemia 4. ETOH abuse 5. GERD 6. Chronic systolic CHF: Nonischemic cardiomyopathy.  - Echo 3/21 with EF < 20%, severe global HK, severely dilated and severely dysfunctional RV, severe biatrial enlargement.  - RHC/LHC (3/21): No CAD; mean RA 15, PA 51/24, mean PCWP 19, CI 3.96.  - HIV negative 4/21 - Cardiac MRI (4/21): LV EF 14%,  moderate LV dilation, mild RV dilation with severely decreased systolic function, moderate pericardial effusion, no LGE noted but images difficult due to recent Fe infusion.   FH: Sister with CHF, brother with pacemaker, mother with CHF.   SH: Married, lives in [place], heavy ETOH in the past has now quit, no drugs.  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   ROS: All systems reviewed and negative except as per HPI.   Current Outpatient Medications  Medication Sig Dispense Refill  . digoxin (LANOXIN) 0.125 MG tablet Take 1 tablet (0.125 mg total) by mouth daily. 30 tablet 5  . ferrous sulfate 325 (65 FE) MG tablet Take 1 tablet (325 mg total) by mouth daily. 30 tablet 3  . Magnesium Oxide 400 MG CAPS Take 1 capsule (400 mg total) by mouth 2 (two) times daily. 180 capsule 3  . Multiple Vitamin (MULTIVITAMIN) tablet Take 1 tablet by mouth daily.    . sacubitril-valsartan (ENTRESTO) 24-26 MG Take 1 tablet by mouth 2 (two) times daily. 60 tablet 5  . spironolactone (ALDACTONE) 25 MG tablet Take 1 tablet (25 mg total) by mouth daily. 30 tablet 5  . torsemide (DEMADEX) 20 MG tablet Take 1 tablet (20 mg total) by mouth daily. 30 tablet 5  . empagliflozin (JARDIANCE) 10 MG TABS tablet Take 10 mg by mouth daily before breakfast. 30 tablet 5   No current facility-administered medications for this encounter.   BP (!) 84/60   Pulse 88   Wt 70.5 kg (  155 lb 6.4 oz)   SpO2 100%   BMI 23.91 kg/m  General: NAD Neck: No JVD, no thyromegaly or thyroid nodule.  Lungs: Clear to auscultation bilaterally with normal respiratory effort. CV: Nondisplaced PMI.  Heart regular S1/S2, no S3/S4, no murmur.  No peripheral edema.  No carotid bruit.  Normal pedal pulses.  Abdomen: Soft, nontender, no hepatosplenomegaly, no distention.  Skin: Intact without lesions or rashes.  Neurologic: Alert and oriented x 3.  Psych: Normal affect. Extremities: No clubbing or cyanosis.  HEENT: Normal.   Assessment/Plan: 1. Chronic  systolic CHF: Nonischemic cardiomyopathy.  Echo from 3/21 showed EF 20% with moderately decreased RV systolic function by my read.  RHC/LHC from 3/21 showed no significant coronary disease, preserved cardiac output, R>L heart failure.  Cardiac MRI in 4/21 showed EF 14%, severe RV dysfunction, no LGE.  HIV negative.  Patient has an extensive history of heavy ETOH intake; it is very possible that this is ETOH cardiomyopathy.  No LGE noted on cardiac MRI but difficult LGE images, cannot rule out prior myocarditis.  Family history is not fully defined.  He has not been noted to have frequent PVCs.  BP is soft but he is feeling much better with no orthostatic symptoms, NYHA class II.  He is not volume overloaded on exam.  He has, for the most part, quit drinking now.   - Add dapagliflozin 10 mg daily and decrease torsemide to 20 mg daily.  BMET today and in 10 days.   - Continue spironolactone 25 daily.  - Continue Entresto 24/26 bid.  - Continue digoxin 0.125, check level today.  - Narrow QRS, so will not be CRT candidate.  - Repeat echo after 6 months of medical management (9/21) to decide on ICD.  2. ETOH abuse: Long history of heavy ETOH.  He has mostly quit drinking now.  This may be the cause of his cardiomyopathy.   - I strongly encouraged him to remain completely abstinent from ETOH.    Followup in 3 wks with HF pharmacist for medication titration.  See me in 6 wks.   Austin [name] 04/11/2020

## 2020-04-11 NOTE — Telephone Encounter (Signed)
Patient Advocate Encounter   Received notification from [employer] that prior authorization for Entresto is required.   PA submitted via fax.  Status is pending   Will continue to follow.  Amaurie Wandel, PharmD, BCPS, BCCP, CPP Heart Failure Clinic Pharmacist [phone]  

## 2020-04-11 NOTE — Patient Instructions (Addendum)
DECREASE Torsemide to 20mg  (1 tab) daily   START Jardiance 10mg  (1 tab) daily   Labs today and repeat in 10 days We will only contact you if something comes back abnormal or we need to make some changes. Otherwise no news is good news!   The social worker Eliezer Lofts will call you to discuss disability.   Your physician recommends that you schedule a follow-up appointment in: 3 weeks with the Pharmacist Lauren and 6 weeks with Dr Aundra Dubin   Please call office at [phone] option 2 if you have any questions or concerns.   At the [facility], you and your health needs are our priority. As part of our continuing mission to provide you with exceptional heart care, we have created designated Provider Care Teams. These Care Teams include your primary Cardiologist (physician) and Advanced Practice Providers (APPs- Physician Assistants and Nurse Practitioners) who all work together to provide you with the care you need, when you need it.   You may see any of the following providers on your designated Care Team at your next follow up: Marland Kitchen Dr Glori Bickers . Dr [name] . Darrick Grinder, NP . Lyda Jester, PA . Audry Riles, PharmD   Please be sure to bring in all your medications bottles to every appointment.

## 2020-04-12 ENCOUNTER — Inpatient Hospital Stay (HOSPITAL_COMMUNITY): Admission: RE | Admit: 2020-04-12 | Payer: 59 | Source: Ambulatory Visit

## 2020-04-12 ENCOUNTER — Telehealth (HOSPITAL_COMMUNITY): Payer: Self-pay | Admitting: Licensed Clinical Social Worker

## 2020-04-12 NOTE — Telephone Encounter (Signed)
CSW consulted to speak with pt regarding disability application process.  CSW called pt and spoke with him and his wife.  Provided with [employer]SA phone number and explained that they needed to call and be screened for an initial phone appt.  Once phone appt complete they would be sent an application packet and CSW encouraged them to call CSW with any questions once they get to this step.  Pt and wife will plan to make appt today so they can get the process started.  No further questions at this time,  Jorge Ny, [facility] Desk#: [phone] Cell#: [phone]

## 2020-04-12 NOTE — Telephone Encounter (Signed)
Advanced Heart Failure Patient Advocate Encounter  Prior Authorization for Delene Loll has been approved.    Effective dates: 04/11/20 through 04/11/21  Audry Riles, PharmD, BCPS, BCCP, CPP Heart Failure Clinic Pharmacist [phone]

## 2020-04-18 ENCOUNTER — Other Ambulatory Visit: Payer: Self-pay

## 2020-04-18 ENCOUNTER — Ambulatory Visit (HOSPITAL_COMMUNITY)
Admission: RE | Admit: 2020-04-18 | Discharge: 2020-04-18 | Disposition: A | Discharge status: Home / Self Care | Payer: 59 | Source: Ambulatory Visit | Attending: Internal Medicine | Admitting: Internal Medicine

## 2020-04-18 DIAGNOSIS — I428 Other cardiomyopathies: Secondary | ICD-10-CM

## 2020-04-18 LAB — BASIC METABOLIC PANEL
Anion gap: 11 (ref 5–15)
BUN: 22 mg/dL (ref 8–23)
CO2: 26 mmol/L (ref 22–32)
Calcium: 9.6 mg/dL (ref 8.9–10.3)
Chloride: 91 mmol/L — ABNORMAL LOW (ref 98–111)
Creatinine, Ser: 1.23 mg/dL (ref 0.61–1.24)
GFR calc Af Amer: >60 mL/min (ref >60)
GFR calc non Af Amer: >60 mL/min (ref >60)
Glucose, Bld: 98 mg/dL (ref 70–99)
Potassium: 4.2 mmol/L (ref 3.5–5.1)
Sodium: 128 mmol/L — ABNORMAL LOW (ref 135–145)

## 2020-04-19 ENCOUNTER — Other Ambulatory Visit: Payer: Self-pay | Admitting: Cardiology

## 2020-04-26 ENCOUNTER — Other Ambulatory Visit: Payer: Self-pay | Admitting: Nurse Practitioner

## 2020-04-26 DIAGNOSIS — I509 Heart failure, unspecified: Secondary | ICD-10-CM

## 2020-05-10 ENCOUNTER — Ambulatory Visit (HOSPITAL_COMMUNITY)
Admission: RE | Admit: 2020-05-10 | Discharge: 2020-05-10 | Disposition: A | Discharge status: Home / Self Care | Payer: 59 | Source: Ambulatory Visit | Attending: Cardiology | Admitting: Cardiology

## 2020-05-10 ENCOUNTER — Other Ambulatory Visit: Payer: Self-pay

## 2020-05-10 VITALS — BP 100/70 | HR 89 | Wt 147.4 lb

## 2020-05-10 DIAGNOSIS — F1011 Alcohol abuse, in remission: Secondary | ICD-10-CM | POA: Insufficient documentation

## 2020-05-10 DIAGNOSIS — I428 Other cardiomyopathies: Secondary | ICD-10-CM | POA: Insufficient documentation

## 2020-05-10 DIAGNOSIS — Z8249 Family history of ischemic heart disease and other diseases of the circulatory system: Secondary | ICD-10-CM | POA: Diagnosis not present

## 2020-05-10 DIAGNOSIS — K219 Gastro-esophageal reflux disease without esophagitis: Secondary | ICD-10-CM | POA: Diagnosis not present

## 2020-05-10 DIAGNOSIS — I110 Hypertensive heart disease with heart failure: Secondary | ICD-10-CM | POA: Insufficient documentation

## 2020-05-10 DIAGNOSIS — Z79899 Other long term (current) drug therapy: Secondary | ICD-10-CM | POA: Diagnosis not present

## 2020-05-10 DIAGNOSIS — I5022 Chronic systolic (congestive) heart failure: Secondary | ICD-10-CM | POA: Diagnosis present

## 2020-05-10 DIAGNOSIS — D509 Iron deficiency anemia, unspecified: Secondary | ICD-10-CM | POA: Insufficient documentation

## 2020-05-10 NOTE — Progress Notes (Addendum)
PCP: [name], MD Cardiology: Dr. Gardiner Rhyme HF Cardiology: Dr. Aundra Dubin  HPI: 64 y.o. with history of ETOH abuse, HTN, iron deficiency anemia, GERD, and chronic systolic CHF which developed in 01/2020. Patient developed exertional dyspnea and an echo in 02/2020 showed EF < 20% with severe RV dysfunction.  RHC/LHC in 02/2020 showed no CAD but R > L filling pressure elevation and preserved cardiac index. Patinet was admitted in 03/2020 with acute/chronic systolic CHF and diuresed. Cardiac MRI showed EF 14%, moderate LV dilation, severely decreased RV function, and moderate pericardial effusion. There was no evidence for myocarditis noted.   Patient, of note, has a history of heavy ETOH intake for decades but has mostly quit drinking now. He has some family history of CHF (sister, maybe his mother). Patient denies using illegal drugs (cocaine, amphetamines).    Recently presented to HF Clinic with Dr. Aundra Dubin on 04/11/2020. At that visit, Patient had reported feeling good since discharge.  BP was low but he denied lightheadedness.  He had mostly quit drinking, this is corroborated by his wife. No dyspnea walking on flat ground and able to walk up a flight of stairs.  Orthopnea had resolved.  Better appetite.  Today he returns to HF clinic for pharmacist medication titration. At last visit with MD, empagliflozin 10 mg daily was added and torsemide was decreased to 20 mg daily. A repeat basic metabolic panel showed a mild increase in creatinine from 1.07 to 1.23 but otherwise stable. Overall he is feeling well today. No dizziness, lightheadedness, chest pains or palpitations. No SOB/DOE. States he does tire more easily than he used to. His weight is down 7 lbs from last visit. States his weight is stable at home. He takes torsemide 20 mg daily and has not needed any extra. No LEE, PND or orthopnea. Noted he was only taking Entresto daily instead of BID because he didn't know it was supposed to be taken twice  daily. Tolerating all medications.    HF Medications: sacubitril-valsartan 24-26 mg BID - patient reported only taking once a day spironolactone 25 mg daily empagliflozin 10 mg daily digoxin 0.125 mg daily torsemide 20 mg daily   Has the patient been experiencing any side effects to the medications prescribed?  No, patient reports no side effects to therapy.  Does the patient have any problems obtaining medications due to transportation or finances?  The patient reported difficulty of obtaining sacubitril-valsartan due to the need for a PA. [facility] was called and the PA issue is now resolved. He has copay cards for both Entresto and empagliflozin.   Understanding of regimen: patient was taking all medications correctly with the exception of sacubitril-valsartan, which was prescribed to be taken twice a day, however the patient reported only taking once day Understanding of indications: good Potential of compliance: excellent Patient understands to avoid NSAIDs. Patient understands to avoid decongestants.    Pertinent Lab Values from 04/18/20: . Serum creatinine 1.23, BUN 22, Potassium 4.2, Sodium 128, Digoxin 0.6 ng/mL  Vital Signs: . Weight: 147.4 lbs (last clinic weight: 155.4 lbs) . Blood pressure: 100/70  . Heart rate: 89 beats per minute  Assessment: 1. Chronic systolic CHF: Nonischemic cardiomyopathy. Echo from 3/21 showed EF 20% with moderately decreased RV systolic function. RHC/LHC from 3/21 showed no significant coronary disease, preserved cardiac output, R>L heart failure.Cardiac MRI in 4/21 showed EF 14%, severe RV dysfunction, no LGE.  HIV negative.  Patient has an extensive history of heavy ETOH intake; it is very possible that  this is ETOH cardiomyopathy.  No LGE noted on cardiac MRI but difficult LGE images, cannot rule out prior myocarditis.  - NYHA class II.  He is not volume overloaded on exam.  He has, for the most part, quit drinking now.   - Vitals: BP 100/70, HR  89 - Instructed patient to take sacubitril-valsartan 24-26 mg BID rather than once a day.  - Continue empagliflozin 10 mg daily - Continue spironolactone 25 mg daily - Continue digoxin 0.125 mg daily - Continue torsemide 20 mg daily - Narrow QRS, so will not be CRT candidate.  - Repeat echo after 6 months of medical management (08/2020) to decide on ICD.  2. ETOH abuse: Long history of heavy ETOH.  He has mostly quit drinking now.  This may be the cause of his cardiomyopathy.     Plan: 1) Medication changes: Based on clinical presentation, vital signs and recent labs will instructed patient to take sacubitril-valsartan 24-26 mg BID instead of just once daily. 2) Labs: check BMET, magnesium, and iron panel during appointment with Dr. Aundra Dubin on 05/31/20 3) Follow-up: 3 weeks with Dr. Phillips Hay, PharmD PGY1 Wing Resident [phone]  Audry Riles, PharmD, BCPS, [facility], CPP Heart Failure Clinic Pharmacist [phone]

## 2020-05-10 NOTE — Patient Instructions (Addendum)
It was a pleasure seeing you today!  MEDICATIONS: -We are changing your medications today -Start taking Entresto 24/26 twice a day -Call if you have questions about your medications.  NEXT APPOINTMENT: Return to clinic in 6/23 with Dr. Aundra Dubin. We will check labs during visit with Dr. Aundra Dubin.  In general, to take care of your heart failure: -Limit your fluid intake to 2 Liters (half-gallon) per day.   -Limit your salt intake to ideally 2-3 grams (2000-3000 mg) per day. -Weigh yourself daily and record, and bring that "weight diary" to your next appointment.  (Weight gain of 2-3 pounds in 1 day typically means fluid weight.) -The medications for your heart are to help your heart and help you live longer.   -Please contact us before stopping any of your heart medications.  Call the clinic at [phone] with questions or to reschedule future appointments.

## 2020-05-26 ENCOUNTER — Telehealth (HOSPITAL_COMMUNITY): Payer: Self-pay | Admitting: Pharmacist

## 2020-05-26 NOTE — Telephone Encounter (Signed)
Provided patient with Jardiance samples.  Medication: Jardiance 10 mg tablets Quantity: 28  Lot: [id number] Expiration date: 09/2020  Dropped samples off at front desk for patient.   Sherren Kerns, PharmD PGY1 Acute Care Pharmacy Resident  Audry Riles, PharmD, BCPS, CPP Heart Failure Clinic Pharmacist [phone]

## 2020-05-31 ENCOUNTER — Encounter (HOSPITAL_COMMUNITY): Payer: Self-pay | Admitting: Cardiology

## 2020-05-31 ENCOUNTER — Ambulatory Visit (HOSPITAL_COMMUNITY)
Admission: RE | Admit: 2020-05-31 | Discharge: 2020-05-31 | Disposition: A | Discharge status: Home / Self Care | Payer: 59 | Source: Ambulatory Visit | Attending: Cardiology | Admitting: Cardiology

## 2020-05-31 ENCOUNTER — Other Ambulatory Visit: Payer: Self-pay

## 2020-05-31 VITALS — BP 118/72 | HR 81 | Wt 147.4 lb

## 2020-05-31 DIAGNOSIS — Z79899 Other long term (current) drug therapy: Secondary | ICD-10-CM | POA: Insufficient documentation

## 2020-05-31 DIAGNOSIS — I110 Hypertensive heart disease with heart failure: Secondary | ICD-10-CM | POA: Diagnosis not present

## 2020-05-31 DIAGNOSIS — F1010 Alcohol abuse, uncomplicated: Secondary | ICD-10-CM | POA: Insufficient documentation

## 2020-05-31 DIAGNOSIS — I3131 Malignant pericardial effusion in diseases classified elsewhere: Secondary | ICD-10-CM

## 2020-05-31 DIAGNOSIS — K219 Gastro-esophageal reflux disease without esophagitis: Secondary | ICD-10-CM | POA: Diagnosis not present

## 2020-05-31 DIAGNOSIS — Z7984 Long term (current) use of oral hypoglycemic drugs: Secondary | ICD-10-CM | POA: Diagnosis not present

## 2020-05-31 DIAGNOSIS — C801 Malignant (primary) neoplasm, unspecified: Secondary | ICD-10-CM

## 2020-05-31 DIAGNOSIS — I5043 Acute on chronic combined systolic (congestive) and diastolic (congestive) heart failure: Secondary | ICD-10-CM | POA: Diagnosis not present

## 2020-05-31 DIAGNOSIS — D509 Iron deficiency anemia, unspecified: Secondary | ICD-10-CM | POA: Diagnosis not present

## 2020-05-31 DIAGNOSIS — Z8546 Personal history of malignant neoplasm of prostate: Secondary | ICD-10-CM | POA: Insufficient documentation

## 2020-05-31 DIAGNOSIS — Z8249 Family history of ischemic heart disease and other diseases of the circulatory system: Secondary | ICD-10-CM | POA: Insufficient documentation

## 2020-05-31 DIAGNOSIS — I313 Pericardial effusion (noninflammatory): Secondary | ICD-10-CM | POA: Diagnosis not present

## 2020-05-31 DIAGNOSIS — I5022 Chronic systolic (congestive) heart failure: Secondary | ICD-10-CM | POA: Diagnosis not present

## 2020-05-31 DIAGNOSIS — I428 Other cardiomyopathies: Secondary | ICD-10-CM | POA: Insufficient documentation

## 2020-05-31 LAB — BASIC METABOLIC PANEL WITH GFR
Anion gap: 11 (ref 5–15)
BUN: 24 mg/dL — ABNORMAL HIGH (ref 8–23)
CO2: 28 mmol/L (ref 22–32)
Calcium: 9.2 mg/dL (ref 8.9–10.3)
Chloride: 93 mmol/L — ABNORMAL LOW (ref 98–111)
Creatinine, Ser: 1.48 mg/dL — ABNORMAL HIGH (ref 0.61–1.24)
GFR calc Af Amer: 57 mL/min — ABNORMAL LOW (ref >60)
GFR calc non Af Amer: 49 mL/min — ABNORMAL LOW (ref >60)
Glucose, Bld: 80 mg/dL (ref 70–99)
Potassium: 3.5 mmol/L (ref 3.5–5.1)
Sodium: 132 mmol/L — ABNORMAL LOW (ref 135–145)

## 2020-05-31 LAB — DIGOXIN LEVEL: Digoxin Level: 1 ng/mL (ref 0.8–2.0)

## 2020-05-31 MED ORDER — CARVEDILOL 3.125 MG PO TABS: 3.1250 mg | ORAL_TABLET | Freq: Two times a day (BID) | ORAL | 3 refills | Status: DC

## 2020-05-31 NOTE — Patient Instructions (Signed)
START Carvedilol 3.125mg  twice daily  Routine lab work today. Will notify you of abnormal results  Follow up and echo in 6 weeks.

## 2020-05-31 NOTE — Progress Notes (Signed)
PCP: [name], MD Cardiology: Dr. Gardiner Rhyme HF Cardiology: Dr. Aundra Dubin  64 y.o. with history of ETOH abuse and prior HTN returns for followup of nonischemic cardiomyopathy. Patient had no known prior cardiac history.  In 2/21, he developed exertional dyspnea and was noted at an urgent care center to have CHF.  Echo in 3/21 showed EF < 20% with severe RV dysfunction.  RHC/LHC in 3/21 showed no CAD but R > L filling pressure elevation and preserved cardiac index.  He was admitted in 4/21 with acute/chronic systolic CHF.  He was diuresed and cardiac meds were adjusted.  Cardiac MRI this admission showed EF 14%, moderate LV dilation, severely decreased RV function, and moderate pericardial effusion. There was no evidence for myocarditis noted.   Patient, of note, has a history of heavy ETOH intake for decades. Per his wife, he would drink heavily every day after getting home from work and especially heavily on the weekend. He has a history of ETOH withdrawal.  He does have some family history of CHF (sister, maybe his mother), but he does not have a lot of detail about these diagnoses. No drugs (cocaine, amphetamines).   Patient has been stable symptomatically.  No orthopnea/PND.  No significant exertional dyspnea.  No chest pain.  No lightheadedness.  Wants to exercise more, plans to swim and walk his dog.  He has cut back on beer but still drinks around 2 beers several nights a week.  No more malt liquor.  Weight is down 8 lbs.   Labs (4/21): HIV negative, K 4.2, creatinine 0.93 Labs (5/21): K 4.2, creatinine 1.23, digoxin 0.6  PMH: 1. Prostate cancer 2. HTN: BP now low.  3. Fe deficiency anemia 4. ETOH abuse 5. GERD 6. Chronic systolic CHF: Nonischemic cardiomyopathy.  - Echo 3/21 with EF < 20%, severe global HK, severely dilated and severely dysfunctional RV, severe biatrial enlargement.  - RHC/LHC (3/21): No CAD; mean RA 15, PA 51/24, mean PCWP 19, CI 3.96.  - HIV negative 4/21 - Cardiac  MRI (4/21): LV EF 14%, moderate LV dilation, mild RV dilation with severely decreased systolic function, moderate pericardial effusion, no LGE noted but images difficult due to recent Fe infusion.   FH: Sister with CHF, brother with pacemaker, mother with CHF.   SH: Married, lives in [place], heavy ETOH in the past has now quit, no drugs.  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   ROS: All systems reviewed and negative except as per HPI.   Current Outpatient Medications  Medication Sig Dispense Refill  . digoxin (LANOXIN) 0.125 MG tablet TAKE 1 TABLET BY MOUTH DAILY 90 tablet 1  . empagliflozin (JARDIANCE) 10 MG TABS tablet Take 10 mg by mouth daily before breakfast. 30 tablet 5  . ferrous sulfate 325 (65 FE) MG tablet Take 1 tablet (325 mg total) by mouth daily. 30 tablet 3  . Magnesium Oxide 400 MG CAPS Take 1 capsule (400 mg total) by mouth 2 (two) times daily. 180 capsule 3  . Multiple Vitamin (MULTIVITAMIN) tablet Take 1 tablet by mouth daily.    . sacubitril-valsartan (ENTRESTO) 24-26 MG Take 1 tablet by mouth 2 (two) times daily. 60 tablet 5  . spironolactone (ALDACTONE) 25 MG tablet Take 1 tablet (25 mg total) by mouth daily. 30 tablet 5  . torsemide (DEMADEX) 20 MG tablet Take 1 tablet (20 mg total) by mouth daily. 30 tablet 5  . carvedilol (COREG) 3.125 MG tablet Take 1 tablet (3.125 mg total) by mouth 2 (two)  times daily with a meal. 60 tablet 3   No current facility-administered medications for this encounter.   BP 118/72   Pulse 81   Wt 66.8 kg (147 lb 6 oz)   SpO2 98%   BMI 22.67 kg/m  General: NAD Neck: No JVD, no thyromegaly or thyroid nodule.  Lungs: Clear to auscultation bilaterally with normal respiratory effort. CV: Nondisplaced PMI.  Heart regular S1/S2, no S3/S4, no murmur.  No peripheral edema.  No carotid bruit.  Normal pedal pulses.  Abdomen: Soft, nontender, no hepatosplenomegaly, no distention.  Skin: Intact without lesions or rashes.  Neurologic: Alert and  oriented x 3.  Psych: Normal affect. Extremities: No clubbing or cyanosis.  HEENT: Normal.   Assessment/Plan: 1. Chronic systolic CHF: Nonischemic cardiomyopathy.  Echo from 3/21 showed EF 20% with moderately decreased RV systolic function by my read.  RHC/LHC from 3/21 showed no significant coronary disease, preserved cardiac output, R>L heart failure.  Cardiac MRI in 4/21 showed EF 14%, severe RV dysfunction, no LGE.  HIV negative.  Patient has an extensive history of heavy ETOH intake; it is very possible that this is ETOH cardiomyopathy.  No LGE noted on cardiac MRI but difficult LGE images, cannot rule out prior myocarditis.  Family history is not fully defined.  He has not been noted to have frequent PVCs.  NYHA class II.  He is not volume overloaded on exam.  He is still drinking some but has cut back a lot.    - Continue empagliflozin 10 mg daily.    - Continue spironolactone 25 daily. BMET today.  - Continue Entresto 24/26 bid.  - Continue digoxin 0.125, check level today.  - I think he can cut back on torsemide to every other day.  - Start Coreg 3.125 mg bid.  - Narrow QRS, so will not be CRT candidate.  - Followup in 6 weeks with limited repeat echo to follow pericardial effusion.  Would get full echo in 9/21 to decide on need for ICD.  2. ETOH abuse: Long history of heavy ETOH.  He has cut back.  This may be the cause of his cardiomyopathy.   - I strongly encouraged him to remain completely abstinent from ETOH.    Followup in 6 wks with limited echo for pericardial effusion.   [name] 05/31/2020

## 2020-06-02 ENCOUNTER — Other Ambulatory Visit (HOSPITAL_COMMUNITY): Payer: Self-pay

## 2020-06-02 DIAGNOSIS — I5022 Chronic systolic (congestive) heart failure: Secondary | ICD-10-CM

## 2020-06-02 MED ORDER — TORSEMIDE 20 MG PO TABS: 20.0000 mg | ORAL_TABLET | ORAL | 3 refills | Status: DC

## 2020-06-02 NOTE — Progress Notes (Signed)
Pt and pts wife advised and verbalized understanding,med list updated to reflect changes, lab appt scheduled,lab order entered

## 2020-06-06 ENCOUNTER — Ambulatory Visit (INDEPENDENT_AMBULATORY_CARE_PROVIDER_SITE_OTHER): Payer: 59 | Admitting: Nurse Practitioner

## 2020-06-06 ENCOUNTER — Encounter: Payer: Self-pay | Admitting: Nurse Practitioner

## 2020-06-06 ENCOUNTER — Other Ambulatory Visit: Payer: Self-pay

## 2020-06-06 VITALS — BP 124/80 | HR 95 | Temp 98.6°F | Ht 67.6 in | Wt 147.0 lb

## 2020-06-06 DIAGNOSIS — D509 Iron deficiency anemia, unspecified: Secondary | ICD-10-CM

## 2020-06-06 DIAGNOSIS — F1010 Alcohol abuse, uncomplicated: Secondary | ICD-10-CM | POA: Diagnosis not present

## 2020-06-06 NOTE — Patient Instructions (Signed)
   I strongly encourage you to stop drinking alcohol.

## 2020-06-06 NOTE — Progress Notes (Signed)
This visit occurred during the SARS-CoV-2 public health emergency.  Safety protocols were in place, including screening questions prior to the visit, additional usage of staff PPE, and extensive cleaning of exam room while observing appropriate contact time as indicated for disinfecting solutions.  Subjective:     Patient ID: Austin [name] , male    DOB: Feb 28, 1956 , 64 y.o.   MRN: [id number]   Chief Complaint  Patient presents with  . iron f/u    Here to recheck iron. He is taking his iron supplement daily.  He is not wearing his mask initially states "I have been fully vaccinated" but did put a mask on once I spoke with him. Denies shortness of breath.  States "I don't know why I keep coming here", he also snatched the mask out of my hand.  At times he was talking loudly and had to ask him to not speak so loudly      Past Medical History:  Diagnosis Date  . Arthritis    hands  . GERD (gastroesophageal reflux disease)   . Hypertension   . Hypomagnesemia   . Hyponatremia   . Prostate cancer [facility])      Family History  Problem Relation Age of Onset  . Hypertension Mother   . Hypertension Father      Current Outpatient Medications:  .  carvedilol (COREG) 3.125 MG tablet, Take 1 tablet (3.125 mg total) by mouth 2 (two) times daily with a meal., Disp: 60 tablet, Rfl: 3 .  digoxin (LANOXIN) 0.125 MG tablet, TAKE 1 TABLET BY MOUTH DAILY, Disp: 90 tablet, Rfl: 1 .  empagliflozin (JARDIANCE) 10 MG TABS tablet, Take 10 mg by mouth daily before breakfast., Disp: 30 tablet, Rfl: 5 .  ferrous sulfate 325 (65 FE) MG tablet, Take 1 tablet (325 mg total) by mouth daily., Disp: 30 tablet, Rfl: 3 .  Magnesium Oxide 400 MG CAPS, Take 1 capsule (400 mg total) by mouth 2 (two) times daily., Disp: 180 capsule, Rfl: 3 .  Multiple Vitamin (MULTIVITAMIN) tablet, Take 1 tablet by mouth daily., Disp: , Rfl:  .  sacubitril-valsartan (ENTRESTO) 24-26 MG, Take 1 tablet by mouth 2 (two) times daily.,  Disp: 60 tablet, Rfl: 5 .  spironolactone (ALDACTONE) 25 MG tablet, Take 1 tablet (25 mg total) by mouth daily., Disp: 30 tablet, Rfl: 5 .  torsemide (DEMADEX) 20 MG tablet, Take 1 tablet (20 mg total) by mouth every other day., Disp: 45 tablet, Rfl: 3   Allergies  Allergen Reactions  . Latex Swelling and Rash     Review of Systems  Constitutional: Negative.  Negative for fatigue.  Respiratory: Negative.  Negative for cough.   Cardiovascular: Negative.  Negative for chest pain, palpitations and leg swelling.  Neurological: Negative for dizziness and headaches.  Psychiatric/Behavioral: Negative.      Today's Vitals   06/06/20 1027  BP: 124/80  Pulse: 95  Temp: 98.6 F (37 C)  TempSrc: Oral  Weight: 147 lb (66.7 kg)  Height: 5' 7.6" (1.717 m)  PainSc: 0-No pain   Body mass index is 22.62 kg/m.   Objective:  Physical Exam Constitutional:      General: He is not in acute distress.    Appearance: Normal appearance.  Cardiovascular:     Rate and Rhythm: Normal rate and regular rhythm.     Pulses: Normal pulses.     Heart sounds: Normal heart sounds. No murmur heard.   Pulmonary:     Effort: Pulmonary effort  is normal. No respiratory distress.     Breath sounds: Normal breath sounds.  Skin:    General: Skin is warm and dry.  Neurological:     General: No focal deficit present.     Mental Status: He is alert and oriented to person, place, and time.     Cranial Nerves: No cranial nerve deficit.  Psychiatric:        Mood and Affect: Mood normal.        Behavior: Behavior normal.        Thought Content: Thought content normal.        Judgment: Judgment normal.     Comments: He is speaking loudly and being rude today.           Assessment And Plan:     1. Iron deficiency anemia, unspecified iron deficiency anemia type  Will recheck his iron studies  He is taking an oral iron supplement daily.  - Iron, TIBC and Ferritin Panel - CBC with  Differential/Platelet  2. ETOH abuse  He appears to be under the influence due to the behavior he is portraying.  I have discussed with him he can not come into the office acting the way he is being rude to staff and without wearing a mask as that is our current policy due to the EPPIR-51 Pandemic.     [name], FNP    THE PATIENT IS ENCOURAGED TO PRACTICE SOCIAL DISTANCING DUE TO THE COVID-19 PANDEMIC.

## 2020-06-07 LAB — CBC WITH DIFFERENTIAL/PLATELET
Basophils Absolute: 0 10*3/uL (ref 0.0–0.2)
Basos: 1 %
EOS (ABSOLUTE): 0.1 10*3/uL (ref 0.0–0.4)
Eos: 1 %
Hematocrit: 30.3 % — ABNORMAL LOW (ref 37.5–51.0)
Hemoglobin: 10.1 g/dL — ABNORMAL LOW (ref 13.0–17.7)
Immature Grans (Abs): 0 10*3/uL (ref 0.0–0.1)
Immature Granulocytes: 0 %
Lymphocytes Absolute: 2.5 10*3/uL (ref 0.7–3.1)
Lymphs: 42 %
MCH: 32.7 pg (ref 26.6–33.0)
MCHC: 33.3 g/dL (ref 31.5–35.7)
MCV: 98 fL — ABNORMAL HIGH (ref 79–97)
Monocytes Absolute: 0.7 10*3/uL (ref 0.1–0.9)
Monocytes: 13 %
Neutrophils Absolute: 2.5 10*3/uL (ref 1.4–7.0)
Neutrophils: 43 %
Platelets: 219 10*3/uL (ref 150–450)
RBC: 3.09 x10E6/uL — ABNORMAL LOW (ref 4.14–5.80)
RDW: 13.8 % (ref 11.6–15.4)
WBC: 5.7 10*3/uL (ref 3.4–10.8)

## 2020-06-07 LAB — IRON,TIBC AND FERRITIN PANEL
Ferritin: 1664 ng/mL — ABNORMAL HIGH (ref 30–400)
Iron Saturation: 90 % (ref 15–55)
Iron: 231 ug/dL — ABNORMAL HIGH (ref 38–169)
Total Iron Binding Capacity: 257 ug/dL (ref 250–450)
UIBC: 26 ug/dL — ABNORMAL LOW (ref 111–343)

## 2020-06-08 ENCOUNTER — Other Ambulatory Visit: Payer: Self-pay | Admitting: Nurse Practitioner

## 2020-06-08 ENCOUNTER — Telehealth: Payer: Self-pay | Admitting: Nurse Practitioner

## 2020-06-08 DIAGNOSIS — D509 Iron deficiency anemia, unspecified: Secondary | ICD-10-CM

## 2020-06-08 MED ORDER — FERROUS SULFATE 325 (65 FE) MG PO TABS: ORAL_TABLET | ORAL | 1 refills | Status: DC

## 2020-06-08 NOTE — Telephone Encounter (Signed)
Called to give lab results, iron levels are elevated he is to take his iron every other day and repeat labs only in 6 weeks. I also advised his wife he can not come into the office intoxicated and be rude to the staff.

## 2020-06-13 ENCOUNTER — Other Ambulatory Visit: Payer: Self-pay

## 2020-06-13 ENCOUNTER — Ambulatory Visit (HOSPITAL_COMMUNITY)
Admission: RE | Admit: 2020-06-13 | Discharge: 2020-06-13 | Disposition: A | Discharge status: Home / Self Care | Payer: 59 | Source: Ambulatory Visit | Attending: Cardiology | Admitting: Cardiology

## 2020-06-13 DIAGNOSIS — I5022 Chronic systolic (congestive) heart failure: Secondary | ICD-10-CM | POA: Diagnosis present

## 2020-06-13 LAB — BASIC METABOLIC PANEL
Anion gap: 15 (ref 5–15)
BUN: 14 mg/dL (ref 8–23)
CO2: 23 mmol/L (ref 22–32)
Calcium: 9.4 mg/dL (ref 8.9–10.3)
Chloride: 94 mmol/L — ABNORMAL LOW (ref 98–111)
Creatinine, Ser: 1.49 mg/dL — ABNORMAL HIGH (ref 0.61–1.24)
GFR calc Af Amer: 57 mL/min — ABNORMAL LOW (ref >60)
GFR calc non Af Amer: 49 mL/min — ABNORMAL LOW (ref >60)
Glucose, Bld: 107 mg/dL — ABNORMAL HIGH (ref 70–99)
Potassium: 3.8 mmol/L (ref 3.5–5.1)
Sodium: 132 mmol/L — ABNORMAL LOW (ref 135–145)

## 2020-06-26 ENCOUNTER — Other Ambulatory Visit: Payer: Self-pay | Admitting: Nurse Practitioner

## 2020-06-26 DIAGNOSIS — D509 Iron deficiency anemia, unspecified: Secondary | ICD-10-CM

## 2020-07-14 ENCOUNTER — Encounter (HOSPITAL_COMMUNITY): Payer: Self-pay | Admitting: Cardiology

## 2020-07-14 ENCOUNTER — Ambulatory Visit (HOSPITAL_COMMUNITY)
Admission: RE | Admit: 2020-07-14 | Discharge: 2020-07-14 | Disposition: A | Discharge status: Home / Self Care | Payer: 59 | Source: Ambulatory Visit | Attending: Cardiology | Admitting: Cardiology

## 2020-07-14 ENCOUNTER — Ambulatory Visit (HOSPITAL_BASED_OUTPATIENT_CLINIC_OR_DEPARTMENT_OTHER)
Admission: RE | Admit: 2020-07-14 | Discharge: 2020-07-14 | Disposition: A | Discharge status: Home / Self Care | Payer: 59 | Source: Ambulatory Visit | Attending: Cardiology | Admitting: Cardiology

## 2020-07-14 ENCOUNTER — Other Ambulatory Visit: Payer: Self-pay

## 2020-07-14 VITALS — BP 106/80 | HR 103 | Ht 67.6 in | Wt 146.0 lb

## 2020-07-14 DIAGNOSIS — Z8249 Family history of ischemic heart disease and other diseases of the circulatory system: Secondary | ICD-10-CM | POA: Insufficient documentation

## 2020-07-14 DIAGNOSIS — Z8546 Personal history of malignant neoplasm of prostate: Secondary | ICD-10-CM | POA: Insufficient documentation

## 2020-07-14 DIAGNOSIS — K219 Gastro-esophageal reflux disease without esophagitis: Secondary | ICD-10-CM | POA: Insufficient documentation

## 2020-07-14 DIAGNOSIS — Z7984 Long term (current) use of oral hypoglycemic drugs: Secondary | ICD-10-CM | POA: Diagnosis not present

## 2020-07-14 DIAGNOSIS — I5022 Chronic systolic (congestive) heart failure: Secondary | ICD-10-CM | POA: Diagnosis not present

## 2020-07-14 DIAGNOSIS — R9431 Abnormal electrocardiogram [ECG] [EKG]: Secondary | ICD-10-CM | POA: Diagnosis not present

## 2020-07-14 DIAGNOSIS — F1010 Alcohol abuse, uncomplicated: Secondary | ICD-10-CM | POA: Diagnosis not present

## 2020-07-14 DIAGNOSIS — I428 Other cardiomyopathies: Secondary | ICD-10-CM | POA: Insufficient documentation

## 2020-07-14 DIAGNOSIS — I313 Pericardial effusion (noninflammatory): Secondary | ICD-10-CM | POA: Insufficient documentation

## 2020-07-14 DIAGNOSIS — I110 Hypertensive heart disease with heart failure: Secondary | ICD-10-CM | POA: Insufficient documentation

## 2020-07-14 DIAGNOSIS — I509 Heart failure, unspecified: Secondary | ICD-10-CM | POA: Insufficient documentation

## 2020-07-14 DIAGNOSIS — Z79899 Other long term (current) drug therapy: Secondary | ICD-10-CM | POA: Insufficient documentation

## 2020-07-14 DIAGNOSIS — I5043 Acute on chronic combined systolic (congestive) and diastolic (congestive) heart failure: Secondary | ICD-10-CM | POA: Diagnosis not present

## 2020-07-14 DIAGNOSIS — I5042 Chronic combined systolic (congestive) and diastolic (congestive) heart failure: Secondary | ICD-10-CM | POA: Diagnosis not present

## 2020-07-14 LAB — BASIC METABOLIC PANEL
Anion gap: 14 (ref 5–15)
BUN: 11 mg/dL (ref 8–23)
CO2: 24 mmol/L (ref 22–32)
Calcium: 9.6 mg/dL (ref 8.9–10.3)
Chloride: 97 mmol/L — ABNORMAL LOW (ref 98–111)
Creatinine, Ser: 1.12 mg/dL (ref 0.61–1.24)
GFR calc Af Amer: >60 mL/min (ref >60)
GFR calc non Af Amer: >60 mL/min (ref >60)
Glucose, Bld: 105 mg/dL — ABNORMAL HIGH (ref 70–99)
Potassium: 4.2 mmol/L (ref 3.5–5.1)
Sodium: 135 mmol/L (ref 135–145)

## 2020-07-14 LAB — ECHOCARDIOGRAM COMPLETE
Area-P 1/2: 4.68 cm2
Calc EF: 37.1 %
S' Lateral: 4.05 cm
Single Plane A2C EF: 38.3 %
Single Plane A4C EF: 32.2 %

## 2020-07-14 LAB — DIGOXIN LEVEL: Digoxin Level: 0.2 ng/mL — ABNORMAL LOW (ref 0.8–2.0)

## 2020-07-14 MED ORDER — CARVEDILOL 6.25 MG PO TABS
6.2500 mg | ORAL_TABLET | Freq: Two times a day (BID) | ORAL | 1 refills | Status: DC
Start: 2020-07-14 — End: 2020-07-31

## 2020-07-14 NOTE — Patient Instructions (Addendum)
Increase Carvedilol to 6.25 mg Twice daily   Decrease Torsemide to 20 mg every other day   Labs done today, we will call you for abnormal labs  Please follow up with our heart failure pharmacist in 2-3 weeks  Your physician recommends that you schedule a follow-up appointment in: 2 months with an echocardiogram  If you have any questions or concerns before your next appointment please send [place] a message through [place] or call our office at [phone].    TO LEAVE A MESSAGE FOR THE NURSE SELECT OPTION 2, PLEASE LEAVE A MESSAGE INCLUDING: . YOUR NAME . DATE OF BIRTH . CALL BACK NUMBER . REASON FOR CALL**this is important as we prioritize the call backs  [place] AS LONG AS YOU CALL BEFORE 4:00 PM  At the [facility], you and your health needs are our priority. As part of our continuing mission to provide you with exceptional heart care, we have created designated Provider Care Teams. These Care Teams include your primary Cardiologist (physician) and Advanced Practice Providers (APPs- Physician Assistants and Nurse Practitioners) who all work together to provide you with the care you need, when you need it.   You may see any of the following providers on your designated Care Team at your next follow up: Marland Kitchen Dr Glori Bickers . Dr [name] . Darrick Grinder, NP . Lyda Jester, PA . Audry Riles, PharmD   Please be sure to bring in all your medications bottles to every appointment.

## 2020-07-14 NOTE — Progress Notes (Signed)
  Echocardiogram 2D Echocardiogram has been performed.  Bobbye Charleston 07/14/2020, 11:03 AM

## 2020-07-16 NOTE — Progress Notes (Signed)
PCP: Austin Brine, FNP Cardiology: Dr. Gardiner Rhyme HF Cardiology: Dr. Aundra Dubin  64 y.o. with history of ETOH abuse and prior HTN returns for followup of nonischemic cardiomyopathy. Patient had no known prior cardiac history.  In 2/21, he developed exertional dyspnea and was noted at an urgent care center to have CHF.  Echo in 3/21 showed EF < 20% with severe RV dysfunction.  RHC/LHC in 3/21 showed no CAD but R > L filling pressure elevation and preserved cardiac index.  He was admitted in 4/21 with acute/chronic systolic CHF.  He was diuresed and cardiac meds were adjusted.  Cardiac MRI this admission showed EF 14%, moderate LV dilation, severely decreased RV function, and moderate pericardial effusion. There was no evidence for myocarditis noted.   Patient, of note, has a history of heavy ETOH intake for decades. Per his wife, he would drink heavily every day after getting home from work and especially heavily on the weekend. He has a history of ETOH withdrawal.  He does have some family history of CHF (sister, maybe his mother), but he does not have a lot of detail about these diagnoses. No drugs (cocaine, amphetamines).   Echo was done today and reviewed, EF 30-35%, diffuse hypokinesis, normal RV, no pericardial effusion.   Patient has been stable symptomatically.  Weight down 1 lb.  He walks down the street for exercise, no dyspnea.  No orthopnea/PND. No lightheadedness.  No chest pain. He has cut back on ETOH, drinking beer occasionally but not heavily.    Labs (4/21): HIV negative, K 4.2, creatinine 0.93 Labs (5/21): K 4.2, creatinine 1.23, digoxin 0.6 Labs (7/21): K 3.8, creatinine 1.49  ECG (personally reviewed): NSR, LAFB, LVH  PMH: 1. Prostate cancer 2. HTN: BP now low.  3. Fe deficiency anemia 4. ETOH abuse 5. GERD 6. Chronic systolic CHF: Nonischemic cardiomyopathy.  - Echo 3/21 with EF < 20%, severe global HK, severely dilated and severely dysfunctional RV, severe biatrial  enlargement.  - RHC/LHC (3/21): No CAD; mean RA 15, PA 51/24, mean PCWP 19, CI 3.96.  - HIV negative 4/21 - Cardiac MRI (4/21): LV EF 14%, moderate LV dilation, mild RV dilation with severely decreased systolic function, moderate pericardial effusion, no LGE noted but images difficult due to recent Fe infusion.  - Echo (8/21): EF 30-35%, diffuse hypokinesis, normal RV, no pericardial fluid.   FH: Sister with CHF, brother with pacemaker, mother with CHF.   SH: Married, lives in [place], heavy ETOH in the past has now quit, no drugs.  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   ROS: All systems reviewed and negative except as per HPI.   Current Outpatient Medications  Medication Sig Dispense Refill  . carvedilol (COREG) 6.25 MG tablet Take 1 tablet (6.25 mg total) by mouth 2 (two) times daily with a meal. 60 tablet 1  . digoxin (LANOXIN) 0.125 MG tablet TAKE 1 TABLET BY MOUTH DAILY 90 tablet 1  . empagliflozin (JARDIANCE) 10 MG TABS tablet Take 10 mg by mouth daily before breakfast. 30 tablet 5  . ferrous sulfate 325 (65 FE) MG tablet TAKE 1 TABLET BY MOUTH EVERY DAY 90 tablet 1  . KLOR-CON M20 20 MEQ tablet Take 20 mEq by mouth daily.    . Magnesium Oxide 400 MG CAPS Take 1 capsule (400 mg total) by mouth 2 (two) times daily. 180 capsule 3  . Multiple Vitamin (MULTIVITAMIN) tablet Take 1 tablet by mouth daily.    . sacubitril-valsartan (ENTRESTO) 24-26 MG Take 1 tablet by  mouth 2 (two) times daily. 60 tablet 5  . spironolactone (ALDACTONE) 25 MG tablet Take 1 tablet (25 mg total) by mouth daily. 30 tablet 5  . torsemide (DEMADEX) 20 MG tablet Take 1 tablet (20 mg total) by mouth every other day. 45 tablet 3   No current facility-administered medications for this encounter.   BP 106/80   Pulse (!) 103   Ht 5' 7.6" (1.717 m)   Wt 66.2 kg (146 lb)   SpO2 98%   BMI 22.46 kg/m  General: NAD Neck: No JVD, no thyromegaly or thyroid nodule.  Lungs: Clear to auscultation bilaterally with normal  respiratory effort. CV: Nondisplaced PMI.  Heart regular S1/S2, no S3/S4, no murmur.  No peripheral edema.  No carotid bruit.  Normal pedal pulses.  Abdomen: Soft, nontender, no hepatosplenomegaly, no distention.  Skin: Intact without lesions or rashes.  Neurologic: Alert and oriented x 3.  Psych: Normal affect. Extremities: No clubbing or cyanosis.  HEENT: Normal.   Assessment/Plan: 1. Chronic systolic CHF: Nonischemic cardiomyopathy.  Echo from 3/21 showed EF 20% with moderately decreased RV systolic function by my read.  RHC/LHC from 3/21 showed no significant coronary disease, preserved cardiac output, R>L heart failure.  Cardiac MRI in 4/21 showed EF 14%, severe RV dysfunction, no LGE.  HIV negative.  Patient has an extensive history of heavy ETOH intake; it is very possible that this is ETOH cardiomyopathy.  No LGE noted on cardiac MRI but difficult LGE images, cannot rule out prior myocarditis.  Family history is not fully defined.  He has not been noted to have frequent PVCs.  Echo was done today and reviewed, EF somewhat improved to 30-35%, no pericardial fluid.  NYHA class II.  He is not volume overloaded on exam.  He is still drinking some but has cut back a lot.    - Continue empagliflozin 10 mg daily.    - Continue spironolactone 25 daily. BMET today.  - Continue Entresto 24/26 bid.  - Continue digoxin 0.125, check level today.  - Decrease torsemide to 20 mg qod.   - Increase Coreg to 6.25 mg bid.  - Narrow QRS, so will not be CRT candidate.  - Repeat echo at followup in 2 months to assess for need for ICD.   2. ETOH abuse: Long history of heavy ETOH.  He has cut back.  This may be the cause of his cardiomyopathy.   - I strongly encouraged him to remain completely abstinent from ETOH.    See pharmacist in 3 wks for medication titration, see me in 2 months with echo.   [name] 07/16/2020

## 2020-07-28 ENCOUNTER — Other Ambulatory Visit (HOSPITAL_COMMUNITY): Payer: Self-pay | Admitting: Cardiology

## 2020-08-04 NOTE — Progress Notes (Signed)
PCP: [name], MD Cardiology: Dr. Gardiner Rhyme HF Cardiology: Dr. Aundra Dubin  HPI: 64 y.o. with history of ETOH abuse, HTN, iron deficiency anemia, GERD, and chronic systolic CHF which developed in 01/2020. Patient developed exertional dyspnea and an echo in 02/2020 showed EF < 20% with severe RV dysfunction.  RHC/LHC in 02/2020 showed no CAD but R > L filling pressure elevation and preserved cardiac index. Patient was admitted in 03/2020 with acute/chronic systolic CHF and diuresed. Cardiac MRI showed EF 14%, moderate LV dilation, severely decreased RV function, and moderate pericardial effusion. There was no evidence for myocarditis noted.   Patient, of note, has a history of heavy ETOH intake for decades but has mostly quit drinking now. He has some family history of CHF (sister, maybe his mother). Patient denies using illegal drugs (cocaine, amphetamines).     Echo was done 07/14/20 and reviewed, EF 30-35%, diffuse hypokinesis, normal RV, no pericardial effusion.   Recently presented to HF Clinic with Dr. Aundra Dubin on 07/14/20. Patient had been stable symptomatically.  Weight was down 1 lb.  He reported walking down the street for exercise, no dyspnea.  No orthopnea/PND. No lightheadedness.  No chest pain. He had cut back on ETOH, drinking beer occasionally but not heavily.    Today he returns to HF clinic for pharmacist medication titration. At last visit with MD, torsemide was decreased to 20 mg every other day and carvedilol was increased to 6.25 mg BID. Overall he is feeling well today. No dizziness unless gets out of bed too quickly. No chest pain or palpitations. No SOB/DOE. Noted he stays active by swimming and walking, but can't swim as far as he used to before stopping. His weight has been stable at 145 lbs at home. Takes torsemide 20 mg every other day and has not needed any extra. No LEE, PND or orthopnea. Taking all medications as prescribed and tolerating all medications.    HF  Medications: carvedilol 6.25 mg BID sacubitril-valsartan 24-26 mg BID spironolactone 25 mg daily empagliflozin 10 mg daily digoxin 0.125 mg daily torsemide 20 mg every other day   Has the patient been experiencing any side effects to the medications prescribed?  No, patient reports no side effects to therapy.  Does the patient have any problems obtaining medications due to transportation or finances?  No - has Nurse, mental health. Of note, will change to Medicare next month (will be 64)   Understanding of regimen: good Understanding of indications: good Potential of compliance: excellent Patient understands to avoid NSAIDs. Patient understands to avoid decongestants.    Pertinent Lab Values from 07/14/20: . Serum creatinine 1.12, BUN 11, Potassium 4.2, Sodium 135, Digoxin 0.2 ng/mL  Vital Signs: . Weight: 145.2 lbs lbs (last clinic weight: 146 lbs) . Blood pressure: 144/84 . Heart rate: 99  Assessment: 1. Chronic systolic CHF: Nonischemic cardiomyopathy. Echo from 3/21 showed EF 20% with moderately decreased RV systolic function. RHC/LHC from 3/21 showed no significant coronary disease, preserved cardiac output, R>L heart failure.Cardiac MRI in 4/21 showed EF 14%, severe RV dysfunction, no LGE.  HIV negative.  Patient has an extensive history of heavy ETOH intake; it is very possible that this is ETOH cardiomyopathy.  No LGE noted on cardiac MRI but difficult LGE images, cannot rule out prior myocarditis.  - NYHA class II.  He is not volume overloaded on exam.  He is still drinking some but has cut back a lot.    - Continue torsemide 20 mg every other day -  Continue carvedilol 6.25 mg BID - Increase Entresto to 49/51 mg BID. Repeat BMET in 3-4 weeks.  - Continue spironolactone 25 mg daily - Continue empagliflozin 10 mg daily - Continue digoxin 0.125 mg daily - Narrow QRS, so will not be CRT candidate.  - Repeat echo at followup in 2 months to assess for need  for ICD.  2. ETOH abuse: Long history of heavy ETOH.  He has cut back.  This may be the cause of his cardiomyopathy.   - I strongly encouraged him to remain completely abstinent from ETOH.       Plan: 1) Medication changes: Based on clinical presentation, vital signs and recent labs will increase Entresto to 49/51 mg BID. 3) Follow-up: 4 weeks with Dr. Rush Farmer, PharmD, BCPS, [facility], CPP Heart Failure Clinic Pharmacist [phone]

## 2020-08-16 ENCOUNTER — Other Ambulatory Visit: Payer: Self-pay

## 2020-08-16 ENCOUNTER — Ambulatory Visit (HOSPITAL_COMMUNITY)
Admission: RE | Admit: 2020-08-16 | Discharge: 2020-08-16 | Disposition: A | Discharge status: Home / Self Care | Payer: 59 | Source: Ambulatory Visit | Attending: Cardiology | Admitting: Cardiology

## 2020-08-16 VITALS — BP 144/84 | HR 99 | Wt 145.2 lb

## 2020-08-16 DIAGNOSIS — I110 Hypertensive heart disease with heart failure: Secondary | ICD-10-CM | POA: Insufficient documentation

## 2020-08-16 DIAGNOSIS — I5022 Chronic systolic (congestive) heart failure: Secondary | ICD-10-CM | POA: Insufficient documentation

## 2020-08-16 DIAGNOSIS — Z79899 Other long term (current) drug therapy: Secondary | ICD-10-CM | POA: Insufficient documentation

## 2020-08-16 DIAGNOSIS — I428 Other cardiomyopathies: Secondary | ICD-10-CM | POA: Diagnosis not present

## 2020-08-16 DIAGNOSIS — F1010 Alcohol abuse, uncomplicated: Secondary | ICD-10-CM | POA: Insufficient documentation

## 2020-08-16 MED ORDER — SACUBITRIL-VALSARTAN 49-51 MG PO TABS: 1.0000 | ORAL_TABLET | Freq: Two times a day (BID) | ORAL | 3 refills | Status: DC

## 2020-08-16 NOTE — Patient Instructions (Signed)
It was a pleasure seeing you today!  MEDICATIONS: -We are changing your medications today -Increase Entresto to 49/51 mg (1 tablet) twice daily. You may take 2 tablets of the 24/26 mg strength twice daily until you pick up the new strength.  -Call if you have questions about your medications.   NEXT APPOINTMENT: Return to clinic in 4 weeks with Dr. Aundra Dubin.  In general, to take care of your heart failure: -Limit your fluid intake to 2 Liters (half-gallon) per day.   -Limit your salt intake to ideally 2-3 grams (2000-3000 mg) per day. -Weigh yourself daily and record, and bring that "weight diary" to your next appointment.  (Weight gain of 2-3 pounds in 1 day typically means fluid weight.) -The medications for your heart are to help your heart and help you live longer.   -Please contact us before stopping any of your heart medications.  Call the clinic at [phone] with questions or to reschedule future appointments.

## 2020-08-23 ENCOUNTER — Other Ambulatory Visit (HOSPITAL_COMMUNITY): Payer: Self-pay | Admitting: Cardiology

## 2020-08-28 ENCOUNTER — Other Ambulatory Visit: Payer: Self-pay | Admitting: Cardiology

## 2020-08-28 NOTE — Telephone Encounter (Signed)
Rx has been sent to the pharmacy electronically. ° °

## 2020-09-03 ENCOUNTER — Encounter: Payer: Self-pay | Admitting: Nurse Practitioner

## 2020-09-03 DIAGNOSIS — D509 Iron deficiency anemia, unspecified: Secondary | ICD-10-CM | POA: Insufficient documentation

## 2020-09-12 ENCOUNTER — Ambulatory Visit (INDEPENDENT_AMBULATORY_CARE_PROVIDER_SITE_OTHER): Payer: Medicare Other | Admitting: Nurse Practitioner

## 2020-09-12 ENCOUNTER — Encounter: Payer: Self-pay | Admitting: Nurse Practitioner

## 2020-09-12 ENCOUNTER — Telehealth (HOSPITAL_COMMUNITY): Payer: Self-pay | Admitting: *Deleted

## 2020-09-12 ENCOUNTER — Other Ambulatory Visit: Payer: Self-pay

## 2020-09-12 VITALS — BP 124/80 | HR 80 | Temp 98.3°F | Ht 67.6 in | Wt 155.4 lb

## 2020-09-12 DIAGNOSIS — I5042 Chronic combined systolic (congestive) and diastolic (congestive) heart failure: Secondary | ICD-10-CM

## 2020-09-12 DIAGNOSIS — D509 Iron deficiency anemia, unspecified: Secondary | ICD-10-CM | POA: Diagnosis not present

## 2020-09-12 DIAGNOSIS — Z23 Encounter for immunization: Secondary | ICD-10-CM | POA: Diagnosis not present

## 2020-09-12 DIAGNOSIS — I10 Essential (primary) hypertension: Secondary | ICD-10-CM | POA: Diagnosis not present

## 2020-09-12 NOTE — Telephone Encounter (Signed)
pts wife called to report pt has gained 4lbs in a week. Pt is asymptomatic but was told by pcp to contact our office. Pt had labs today with pcp. Pt taking all medications as directed.    Routed to [place] for advice

## 2020-09-12 NOTE — Telephone Encounter (Signed)
Pts wife aware and verbalized understanding.

## 2020-09-12 NOTE — Telephone Encounter (Signed)
Take torsemide 20 mg daily x 4 days in a row then back to every other day.

## 2020-09-12 NOTE — Patient Instructions (Signed)
Call the Cardiologist and make aware your recent weight gain - 10 lbs in the last month

## 2020-09-12 NOTE — Progress Notes (Signed)
I,Yamilka Roman [employer] as a Education administrator for [employer], FNP.,have documented all relevant documentation on the behalf of [name], FNP,as directed by  [name], FNP while in the presence of [name], [place].  This visit occurred during the SARS-CoV-2 public health emergency.  Safety protocols were in place, including screening questions prior to the visit, additional usage of staff PPE, and extensive cleaning of exam room while observing appropriate contact time as indicated for disinfecting solutions.  Subjective:     Patient ID: Austin [name] , male    DOB: August 25, 1956 , 64 y.o.   MRN: [id number]   Chief Complaint  Patient presents with  . Hypertension  . Anemia    HPI  Patient here for a blood pressure check and recheck on his iron.  He is scheduled to see the cardiologist in October 20th, will see Dr. Aundra Dubin.    Wt Readings from Last 3 Encounters: 09/12/20 : 155 lb 6.4 oz (70.5 kg) 08/16/20 : 145 lb 3.2 oz (65.9 kg) 07/14/20 : 146 lb (66.2 kg)  He admits to eating more lately.  He is not weighing himself daily.   He is here with his wife today  Hypertension This is a chronic problem. The current episode started more than 1 year ago. The problem is unchanged. The problem is controlled. Pertinent negatives include no anxiety, blurred vision, chest pain, headaches, palpitations or peripheral edema. Risk factors for coronary artery disease include sedentary lifestyle. Past treatments include diuretics and angiotensin blockers. There are no compliance problems.  Hypertensive end-organ damage includes heart failure. There is no history of angina, kidney disease or CAD/MI. There is no history of chronic renal disease.  Anemia There has been no palpitations. Past medical history includes heart failure. There is no history of chronic renal disease.     Past Medical History:  Diagnosis Date  . Arthritis    hands  . GERD (gastroesophageal reflux disease)   .  Hypertension   . Hypomagnesemia   . Hyponatremia   . Prostate cancer [facility])      Family History  Problem Relation Age of Onset  . Hypertension Mother   . Hypertension Father      Current Outpatient Medications:  .  carvedilol (COREG) 6.25 MG tablet, TAKE 1 TABLET (6.25 MG TOTAL) BY MOUTH 2 (TWO) TIMES DAILY WITH A MEAL., Disp: 180 tablet, Rfl: 3 .  digoxin (LANOXIN) 0.125 MG tablet, TAKE 1 TABLET BY MOUTH EVERY DAY, Disp: 90 tablet, Rfl: 1 .  empagliflozin (JARDIANCE) 10 MG TABS tablet, Take 10 mg by mouth daily before breakfast., Disp: 30 tablet, Rfl: 5 .  ferrous sulfate 325 (65 FE) MG tablet, TAKE 1 TABLET BY MOUTH EVERY DAY, Disp: 90 tablet, Rfl: 1 .  KLOR-CON M20 20 MEQ tablet, Take 20 mEq by mouth daily., Disp: , Rfl:  .  Magnesium Oxide 400 MG CAPS, Take 1 capsule (400 mg total) by mouth 2 (two) times daily., Disp: 180 capsule, Rfl: 3 .  Multiple Vitamin (MULTIVITAMIN) tablet, Take 1 tablet by mouth daily., Disp: , Rfl:  .  sacubitril-valsartan (ENTRESTO) 49-51 MG, Take 1 tablet by mouth 2 (two) times daily., Disp: 180 tablet, Rfl: 3 .  spironolactone (ALDACTONE) 25 MG tablet, Take 1 tablet (25 mg total) by mouth daily., Disp: 30 tablet, Rfl: 5 .  torsemide (DEMADEX) 20 MG tablet, Take 1 tablet (20 mg total) by mouth every other day., Disp: 45 tablet, Rfl: 3   Allergies  Allergen Reactions  . Latex  Swelling and Rash     Review of Systems  Constitutional: Negative.   Eyes: Negative for blurred vision.  Respiratory: Negative for cough.   Cardiovascular: Negative for chest pain, palpitations and leg swelling.  Neurological: Negative for dizziness and headaches.  Psychiatric/Behavioral: Negative.      Today's Vitals   09/12/20 1041  BP: 124/80  Pulse: 80  Temp: 98.3 F (36.8 C)  TempSrc: Oral  Weight: 155 lb 6.4 oz (70.5 kg)  Height: 5' 7.6" (1.717 m)  PainSc: 0-No pain   Body mass index is 23.91 kg/m.   Objective:  Physical Exam Vitals reviewed.   Constitutional:      General: He is not in acute distress.    Appearance: Normal appearance.  Cardiovascular:     Rate and Rhythm: Normal rate and regular rhythm.     Pulses: Normal pulses.     Heart sounds: Normal heart sounds. No murmur heard.   Pulmonary:     Effort: Pulmonary effort is normal. No respiratory distress.     Breath sounds: Normal breath sounds.  Musculoskeletal:        General: No swelling or tenderness.     Right lower leg: No edema.     Left lower leg: No edema.  Neurological:     General: No focal deficit present.     Mental Status: He is alert and oriented to person, place, and time.     Cranial Nerves: No cranial nerve deficit.  Psychiatric:        Mood and Affect: Mood normal.        Behavior: Behavior normal.        Thought Content: Thought content normal.        Judgment: Judgment normal.         Assessment And Plan:     1. Chronic combined systolic and diastolic congestive heart failure ([place])  Continue follow up with Cardiology  He has had a 10 lb weight gain in the last month and his BNP has not been checked in several months so will check to see if improved.  I have advised him to contact Cardiology for possible further instructions  I have encouraged him to weight daily - Brain natriuretic peptide - CMP14+EGFR  2. Iron deficiency anemia, unspecified iron deficiency anemia type  Chronic, doing well with iron studies - Iron, TIBC and Ferritin Panel  3. Essential hypertension  Chronic, blood pressure is well controlled at this time - CMP14+EGFR  4. Need for influenza vaccination  Influenza vaccine administered  Encouraged to take Tylenol as needed for fever or muscle aches. - Flu Vaccine QUAD 6+ mos PF IM (Fluarix Quad PF)     Patient was given opportunity to ask questions. Patient verbalized understanding of the plan and was able to repeat key elements of the plan. All questions were answered to their satisfaction.   Teola Bradley, FNP, have reviewed all documentation for this visit. The documentation on 09/12/20 for the exam, diagnosis, procedures, and orders are all accurate and complete.   THE PATIENT IS ENCOURAGED TO PRACTICE SOCIAL DISTANCING DUE TO THE COVID-19 PANDEMIC.

## 2020-09-13 LAB — CMP14+EGFR
ALT: 30 IU/L (ref 0–44)
AST: 53 IU/L — ABNORMAL HIGH (ref 0–40)
Albumin/Globulin Ratio: 1.2 (ref 1.2–2.2)
Albumin: 4.7 g/dL (ref 3.8–4.8)
Alkaline Phosphatase: 111 IU/L (ref 44–121)
BUN/Creatinine Ratio: 12 (ref 10–24)
BUN: 15 mg/dL (ref 8–27)
Bilirubin Total: 0.5 mg/dL (ref 0.0–1.2)
CO2: 23 mmol/L (ref 20–29)
Calcium: 9.9 mg/dL (ref 8.6–10.2)
Chloride: 83 mmol/L — ABNORMAL LOW (ref 96–106)
Creatinine, Ser: 1.25 mg/dL (ref 0.76–1.27)
GFR calc Af Amer: 70 mL/min/{1.73_m2} (ref >59)
GFR calc non Af Amer: 60 mL/min/{1.73_m2} (ref >59)
Globulin, Total: 4 g/dL (ref 1.5–4.5)
Glucose: 82 mg/dL (ref 65–99)
Potassium: 4.2 mmol/L (ref 3.5–5.2)
Sodium: 129 mmol/L — ABNORMAL LOW (ref 134–144)
Total Protein: 8.7 g/dL — ABNORMAL HIGH (ref 6.0–8.5)

## 2020-09-13 LAB — IRON,TIBC AND FERRITIN PANEL
Ferritin: 620 ng/mL — ABNORMAL HIGH (ref 30–400)
Iron Saturation: 28 % (ref 15–55)
Iron: 94 ug/dL (ref 38–169)
Total Iron Binding Capacity: 333 ug/dL (ref 250–450)
UIBC: 239 ug/dL (ref 111–343)

## 2020-09-13 LAB — BRAIN NATRIURETIC PEPTIDE: BNP: 13.8 pg/mL (ref 0.0–100.0)

## 2020-09-14 ENCOUNTER — Other Ambulatory Visit: Payer: Self-pay

## 2020-09-14 DIAGNOSIS — D509 Iron deficiency anemia, unspecified: Secondary | ICD-10-CM

## 2020-09-14 MED ORDER — FERROUS SULFATE 325 (65 FE) MG PO TABS: ORAL_TABLET | ORAL | 1 refills | Status: DC

## 2020-09-15 ENCOUNTER — Other Ambulatory Visit (HOSPITAL_COMMUNITY): Payer: 59

## 2020-09-15 ENCOUNTER — Encounter (HOSPITAL_COMMUNITY): Payer: 59 | Admitting: Cardiology

## 2020-09-22 ENCOUNTER — Telehealth (HOSPITAL_COMMUNITY): Payer: Self-pay | Admitting: Licensed Clinical Social Worker

## 2020-09-22 ENCOUNTER — Telehealth (HOSPITAL_COMMUNITY): Payer: Self-pay | Admitting: Pharmacy Technician

## 2020-09-22 NOTE — Telephone Encounter (Signed)
Spoke with patient today. He is about to change to Medicare since he is turning 64 this month. Started an [place] [employer] assistance.   His current co-pay is $142 for each medication.   Will fax in once signatures are obtained.

## 2020-09-22 NOTE — Telephone Encounter (Signed)
Pharmacy Patient Advocate requested help with pt Jardiance copay.  Pt has $25 copay for this medication but is unable to afford this month though will be transferring to Medicare next month and pharmacy team working on assistance to help with future copays.  CSW able to pick up medications from pharmacy and pay for copay through Patient [place].  Patient to pick up from clinic.  Will continue to follow and assist as needed  Jorge Ny, [facility] Desk#: [phone] Cell#: [phone]

## 2020-09-27 ENCOUNTER — Ambulatory Visit (HOSPITAL_BASED_OUTPATIENT_CLINIC_OR_DEPARTMENT_OTHER)
Admission: RE | Admit: 2020-09-27 | Discharge: 2020-09-27 | Disposition: A | Discharge status: Home / Self Care | Payer: Medicare Other | Source: Ambulatory Visit | Attending: Cardiology | Admitting: Cardiology

## 2020-09-27 ENCOUNTER — Other Ambulatory Visit: Payer: Self-pay

## 2020-09-27 ENCOUNTER — Encounter (HOSPITAL_COMMUNITY): Payer: Self-pay | Admitting: Cardiology

## 2020-09-27 ENCOUNTER — Ambulatory Visit (HOSPITAL_COMMUNITY)
Admission: RE | Admit: 2020-09-27 | Discharge: 2020-09-27 | Disposition: A | Discharge status: Home / Self Care | Payer: Medicare Other | Source: Ambulatory Visit | Attending: Cardiology | Admitting: Cardiology

## 2020-09-27 VITALS — BP 108/70 | HR 101 | Ht 69.5 in | Wt 153.2 lb

## 2020-09-27 DIAGNOSIS — I428 Other cardiomyopathies: Secondary | ICD-10-CM | POA: Insufficient documentation

## 2020-09-27 DIAGNOSIS — I110 Hypertensive heart disease with heart failure: Secondary | ICD-10-CM | POA: Diagnosis not present

## 2020-09-27 DIAGNOSIS — Z79899 Other long term (current) drug therapy: Secondary | ICD-10-CM | POA: Insufficient documentation

## 2020-09-27 DIAGNOSIS — Z8546 Personal history of malignant neoplasm of prostate: Secondary | ICD-10-CM | POA: Insufficient documentation

## 2020-09-27 DIAGNOSIS — K219 Gastro-esophageal reflux disease without esophagitis: Secondary | ICD-10-CM | POA: Diagnosis not present

## 2020-09-27 DIAGNOSIS — I5042 Chronic combined systolic (congestive) and diastolic (congestive) heart failure: Secondary | ICD-10-CM

## 2020-09-27 DIAGNOSIS — I5022 Chronic systolic (congestive) heart failure: Secondary | ICD-10-CM | POA: Insufficient documentation

## 2020-09-27 DIAGNOSIS — Z8249 Family history of ischemic heart disease and other diseases of the circulatory system: Secondary | ICD-10-CM | POA: Diagnosis not present

## 2020-09-27 DIAGNOSIS — F1010 Alcohol abuse, uncomplicated: Secondary | ICD-10-CM | POA: Insufficient documentation

## 2020-09-27 DIAGNOSIS — Z7984 Long term (current) use of oral hypoglycemic drugs: Secondary | ICD-10-CM | POA: Insufficient documentation

## 2020-09-27 LAB — ECHOCARDIOGRAM COMPLETE
Area-P 1/2: 4.8 cm2
S' Lateral: 3.1 cm
Single Plane A4C EF: 47.6 %

## 2020-09-27 LAB — BASIC METABOLIC PANEL
Anion gap: 16 — ABNORMAL HIGH (ref 5–15)
BUN: 13 mg/dL (ref 8–23)
CO2: 24 mmol/L (ref 22–32)
Calcium: 9.6 mg/dL (ref 8.9–10.3)
Chloride: 93 mmol/L — ABNORMAL LOW (ref 98–111)
Creatinine, Ser: 1.41 mg/dL — ABNORMAL HIGH (ref 0.61–1.24)
GFR, Estimated: 52 mL/min — ABNORMAL LOW (ref >60)
Glucose, Bld: 96 mg/dL (ref 70–99)
Potassium: 4 mmol/L (ref 3.5–5.1)
Sodium: 133 mmol/L — ABNORMAL LOW (ref 135–145)

## 2020-09-27 MED ORDER — CARVEDILOL 12.5 MG PO TABS: 12.5000 mg | ORAL_TABLET | Freq: Two times a day (BID) | ORAL | 6 refills | Status: DC

## 2020-09-27 MED ORDER — TORSEMIDE 20 MG PO TABS: 10.0000 mg | ORAL_TABLET | Freq: Every day | ORAL | Status: DC

## 2020-09-27 NOTE — Patient Instructions (Signed)
Decrease Torsemide to 10 mg (1/2 tab) Daily  Increase Carvedilol to 9.375 mg (1 & 1/2 tabs) Twice daily FOR 3 DAYS ONLY then increase to:   Carvedilol 12.5 mg Twice daily, we have sent you in a prescription for this strength tablet  Labs done today, we will call you for abnormal results  Your physician recommends that you schedule a follow-up appointment in: 3 months  If you have any questions or concerns before your next appointment please send [place] a message through [place] or call our office at [phone].    TO LEAVE A MESSAGE FOR THE NURSE SELECT OPTION 2, PLEASE LEAVE A MESSAGE INCLUDING: . YOUR NAME . DATE OF BIRTH . CALL BACK NUMBER . REASON FOR CALL**this is important as we prioritize the call backs  [place] AS LONG AS YOU CALL BEFORE 4:00 PM  At the [facility], you and your health needs are our priority. As part of our continuing mission to provide you with exceptional heart care, we have created designated Provider Care Teams. These Care Teams include your primary Cardiologist (physician) and Advanced Practice Providers (APPs- Physician Assistants and Nurse Practitioners) who all work together to provide you with the care you need, when you need it.   You may see any of the following providers on your designated Care Team at your next follow up: Marland Kitchen Dr Glori Bickers . Dr [name] . Darrick Grinder, NP . Lyda Jester, PA . Audry Riles, PharmD   Please be sure to bring in all your medications bottles to every appointment.

## 2020-09-27 NOTE — Progress Notes (Signed)
PCP: [name], FNP Cardiology: Dr. Gardiner Rhyme HF Cardiology: Dr. Aundra Dubin  64 y.o. with history of ETOH abuse and prior HTN returns for followup of nonischemic cardiomyopathy. Patient had no known prior cardiac history.  In 2/21, he developed exertional dyspnea and was noted at an urgent care center to have CHF.  Echo in 3/21 showed EF < 20% with severe RV dysfunction.  RHC/LHC in 3/21 showed no CAD but R > L filling pressure elevation and preserved cardiac index.  He was admitted in 4/21 with acute/chronic systolic CHF.  He was diuresed and cardiac meds were adjusted.  Cardiac MRI this admission showed EF 14%, moderate LV dilation, severely decreased RV function, and moderate pericardial effusion. There was no evidence for myocarditis noted.   Patient, of note, has a history of heavy ETOH intake for decades. Per his wife, he would drink heavily every day after getting home from work and especially heavily on the weekend. He has a history of ETOH withdrawal.  He does have some family history of CHF (sister, maybe his mother), but he does not have a lot of detail about these diagnoses. No drugs (cocaine, amphetamines).   Echo in 8/21 showed EF 30-35%, diffuse hypokinesis, normal RV, no pericardial effusion. Echo was done today and reviewed, EF 45-50% with global hypokinesis.   Patient has been doing well, no significant exertional dyspnea.  No chest pain.  No orthopnea/PND.  He has been drinking 1 beer/day on average. Weight is up about 7 lbs.    Labs (4/21): HIV negative, K 4.2, creatinine 0.93 Labs (5/21): K 4.2, creatinine 1.23, digoxin 0.6 Labs (7/21): K 3.8, creatinine 1.49 Labs (10/21): K 4.2, creatinine 1.25, BNP 14  ECG (personally reviewed): NSR, LAFB, LVH with repolarization abnormality.   PMH: 1. Prostate cancer 2. HTN: BP now low.  3. Fe deficiency anemia 4. ETOH abuse 5. GERD 6. Chronic systolic CHF: Nonischemic cardiomyopathy.  - Echo 3/21 with EF < 20%, severe global HK,  severely dilated and severely dysfunctional RV, severe biatrial enlargement.  - RHC/LHC (3/21): No CAD; mean RA 15, PA 51/24, mean PCWP 19, CI 3.96.  - HIV negative 4/21 - Cardiac MRI (4/21): LV EF 14%, moderate LV dilation, mild RV dilation with severely decreased systolic function, moderate pericardial effusion, no LGE noted but images difficult due to recent Fe infusion.  - Echo (8/21): EF 30-35%, diffuse hypokinesis, normal RV, no pericardial fluid.  - Echo (10/21): EF 45-50%, diffuse hypokinesis, normal RV.   FH: Sister with CHF, brother with pacemaker, mother with CHF.   SH: Married, lives in [place], heavy ETOH in the past has now cut back, no drugs.  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   ROS: All systems reviewed and negative except as per HPI.   Current Outpatient Medications  Medication Sig Dispense Refill  . carvedilol (COREG) 12.5 MG tablet Take 1 tablet (12.5 mg total) by mouth 2 (two) times daily with a meal. 60 tablet 6  . digoxin (LANOXIN) 0.125 MG tablet TAKE 1 TABLET BY MOUTH EVERY DAY 90 tablet 1  . empagliflozin (JARDIANCE) 10 MG TABS tablet Take 10 mg by mouth daily before breakfast. 30 tablet 5  . ferrous sulfate 325 (65 FE) MG tablet TAKE 1 TABLET BY MOUTH EVERY DAY 90 tablet 1  . KLOR-CON M20 20 MEQ tablet Take 20 mEq by mouth daily.    . Magnesium Oxide 400 MG CAPS Take 1 capsule (400 mg total) by mouth 2 (two) times daily. 180 capsule 3  .  Multiple Vitamin (MULTIVITAMIN) tablet Take 1 tablet by mouth daily.    . sacubitril-valsartan (ENTRESTO) 49-51 MG Take 1 tablet by mouth 2 (two) times daily. 180 tablet 3  . spironolactone (ALDACTONE) 25 MG tablet Take 1 tablet (25 mg total) by mouth daily. 30 tablet 5  . torsemide (DEMADEX) 20 MG tablet Take 0.5 tablets (10 mg total) by mouth daily.     No current facility-administered medications for this encounter.   BP 108/70 (BP Location: Right Arm)   Pulse (!) 101   Ht 5' 9.5" (1.765 m)   Wt 69.5 kg (153 lb 3.2 oz)   SpO2  98%   BMI 22.30 kg/m  General: NAD Neck: No JVD, no thyromegaly or thyroid nodule.  Lungs: Clear to auscultation bilaterally with normal respiratory effort. CV: Nondisplaced PMI.  Heart regular S1/S2, no S3/S4, no murmur.  No peripheral edema.  No carotid bruit.  Normal pedal pulses.  Abdomen: Soft, nontender, no hepatosplenomegaly, no distention.  Skin: Intact without lesions or rashes.  Neurologic: Alert and oriented x 3.  Psych: Normal affect. Extremities: No clubbing or cyanosis.  HEENT: Normal.   Assessment/Plan: 1. Chronic systolic CHF: Nonischemic cardiomyopathy.  Echo from 3/21 showed EF 20% with moderately decreased RV systolic function by my read.  RHC/LHC from 3/21 showed no significant coronary disease, preserved cardiac output, R>L heart failure.  Cardiac MRI in 4/21 showed EF 14%, severe RV dysfunction, no LGE.  HIV negative.  Patient has an extensive history of heavy ETOH intake; it is very possible that this is ETOH cardiomyopathy.  No LGE noted on cardiac MRI but difficult LGE images, cannot rule out prior myocarditis.  Family history is not fully defined.  He has not been noted to have frequent PVCs.  Echo was done today and reviewed, EF improved to 45-50%.  NYHA class II.  He is not volume overloaded on exam.  He is still drinking some but has cut back from prior.   EF now out of ICD range.  - Continue empagliflozin 10 mg daily.    - Continue spironolactone 25 daily. BMET today.  - Continue Entresto 49/51 bid.  - Continue digoxin 0.125, check level today.  - Decrease torsemide to 10 mg daily.    - Increase Coreg to 9.375 mg bid x 3 days then 12.5 mg bid after that.  - Narrow QRS, so will not be CRT candidate.  - I asked him to cut back further on ETOH, still drinking about 1 beer/day.  2. ETOH abuse: Long history of heavy ETOH.  He has cut back but is still drinking some as above.  This may be the cause of his cardiomyopathy.   - I strongly encouraged him to keep ETOH  minimal.    Followup in 3 months.   [name] 09/27/2020

## 2020-09-27 NOTE — Progress Notes (Signed)
  Echocardiogram 2D Echocardiogram has been performed.  Austin [name] 09/27/2020, 8:38 AM

## 2020-09-27 NOTE — Telephone Encounter (Signed)
Sent in application via fax.  Will follow up.  

## 2020-09-28 NOTE — Telephone Encounter (Signed)
[employer] sent a notice saying they need the patient's POI. Called and spoke with the patient. He is going to bring his social security statement to the office.  Will fax upon receipt.

## 2020-09-29 NOTE — Telephone Encounter (Signed)
Advanced Heart Failure Patient Advocate Encounter   Patient was approved to receive Entresto from [employer].  Patient ID: [id number] Effective dates: 09/28/20 through 12/08/20

## 2020-09-29 NOTE — Telephone Encounter (Signed)
Sent POI to [facility] [employer].  Will follow up.

## 2020-10-10 ENCOUNTER — Telehealth (HOSPITAL_COMMUNITY): Payer: Self-pay | Admitting: Licensed Clinical Social Worker

## 2020-10-10 NOTE — Telephone Encounter (Signed)
CSW consulted by Pharmacy Patient Advocate to assist pt with applying for extra help as [employer] was requiring application be determined upon prior to approving patient assistance for Jardiance  CSW called pt and was able to complete application over the phone with him and his wife- should get notification in 3-4 weeks regarding pt approval or denial- informed them they need to reach out once they receive this letter so we can determine next steps  Will continue to follow and assist as needed  Jorge Ny, [place] [facility] Desk#: [phone] Cell#: [phone]

## 2020-10-11 ENCOUNTER — Other Ambulatory Visit: Payer: Self-pay | Admitting: Cardiology

## 2020-11-06 ENCOUNTER — Telehealth (HOSPITAL_COMMUNITY): Payer: Self-pay | Admitting: Licensed Clinical Social Worker

## 2020-11-06 NOTE — Telephone Encounter (Signed)
CSW called to see if pt had received notice from [facility] regarding Extra Help application.  Unable to reach- left VM requesting return call  Jorge Ny, [place] [facility] Desk#: [phone] Cell#: [phone]

## 2020-11-06 NOTE — Telephone Encounter (Signed)
CSW received return call from pt wife who states they have received a letter from [facility] stating they are not eligible for Extra Help program at this time due to having too much income.  CSW asked for her to bring in this letter to clinic so we can submit to [facility] so pt can be approved to receive jardiance for free  Wife to bring in letter tomorrow to clinic  Will continue to follow and assist as needed  Jorge Ny, [place] Social Worker [place] [facility] Desk#: [phone] Cell#: [phone]

## 2020-11-09 ENCOUNTER — Telehealth (HOSPITAL_COMMUNITY): Payer: Self-pay | Admitting: Licensed Clinical Social Worker

## 2020-11-09 NOTE — Telephone Encounter (Signed)
Pt brought in denial from Extra Help program- CSW faxed to [place] for review- awaiting determination  Jorge Ny, [place] [facility] Desk#: [phone] Cell#: [phone]

## 2020-11-13 ENCOUNTER — Other Ambulatory Visit: Payer: Self-pay | Admitting: Nurse Practitioner

## 2020-11-13 DIAGNOSIS — I509 Heart failure, unspecified: Secondary | ICD-10-CM

## 2020-11-16 ENCOUNTER — Telehealth (HOSPITAL_COMMUNITY): Payer: Self-pay | Admitting: Pharmacy Technician

## 2020-11-16 NOTE — Telephone Encounter (Signed)
Advanced Heart Failure Patient Advocate Encounter   Patient was approved to receive Jardiance from [place]  Effective dates: 10/01/20 through 12/08/21  Called and spoke with the patient's wife.  Charlann Boxer, CPhT

## 2020-11-29 ENCOUNTER — Telehealth (HOSPITAL_COMMUNITY): Payer: Self-pay | Admitting: Pharmacy Technician

## 2020-11-29 NOTE — Telephone Encounter (Signed)
It's time to re-enroll patient to receive medication assistance for Entresto from [employer].  Sent in application via fax.  Will follow up.

## 2020-12-04 ENCOUNTER — Ambulatory Visit: Payer: Medicare Other | Admitting: Nurse Practitioner

## 2020-12-06 ENCOUNTER — Other Ambulatory Visit: Payer: Self-pay

## 2020-12-06 ENCOUNTER — Ambulatory Visit (INDEPENDENT_AMBULATORY_CARE_PROVIDER_SITE_OTHER): Payer: Medicare Other | Admitting: Nurse Practitioner

## 2020-12-06 ENCOUNTER — Encounter: Payer: Self-pay | Admitting: Nurse Practitioner

## 2020-12-06 VITALS — BP 128/70 | HR 92 | Temp 98.0°F | Ht 69.5 in | Wt 159.8 lb

## 2020-12-06 DIAGNOSIS — I10 Essential (primary) hypertension: Secondary | ICD-10-CM | POA: Diagnosis not present

## 2020-12-06 DIAGNOSIS — R81 Glycosuria: Secondary | ICD-10-CM

## 2020-12-06 DIAGNOSIS — Z0000 Encounter for general adult medical examination without abnormal findings: Secondary | ICD-10-CM | POA: Diagnosis not present

## 2020-12-06 LAB — POCT URINALYSIS DIPSTICK
Bilirubin, UA: NEGATIVE
Blood, UA: NEGATIVE
Glucose, UA: POSITIVE — AB
Ketones, UA: NEGATIVE
Leukocytes, UA: NEGATIVE
Nitrite, UA: NEGATIVE
Protein, UA: NEGATIVE
Spec Grav, UA: 1.01 (ref 1.010–1.025)
Urobilinogen, UA: 0.2 E.U./dL
pH, UA: 6.5 (ref 5.0–8.0)

## 2020-12-06 LAB — POCT UA - MICROALBUMIN
Albumin/Creatinine Ratio, Urine, POC: <30
Creatinine, POC: 10 mg/dL
Microalbumin Ur, POC: 10 mg/L

## 2020-12-06 NOTE — Progress Notes (Signed)
Subjective:   Austin [name] is a 64 y.o. male who presents for a Welcome to Medicare exam.   He is followed by a cardiologist. Seeing him on Jan. 20th.   Review of Systems:    Review of Systems  Constitutional: Negative.   Respiratory: Negative.  Negative for cough, shortness of breath and wheezing.   Cardiovascular: Negative.  Negative for chest pain and palpitations.        Objective:    Today's Vitals   12/06/20 1501  BP: 128/70  Pulse: 92  Temp: 98 F (36.7 C)  TempSrc: Oral  Weight: 159 lb 12.8 oz (72.5 kg)  Height: 5' 9.5" (1.765 m)   Body mass index is 23.26 kg/m.  Physical Exam Constitutional:      General: He is not in acute distress.    Appearance: He is normal weight.  HENT:     Head: Normocephalic and atraumatic.  Cardiovascular:     Rate and Rhythm: Normal rate and regular rhythm.     Pulses: Normal pulses.     Heart sounds: Normal heart sounds.  Pulmonary:     Effort: Pulmonary effort is normal.     Breath sounds: Normal breath sounds. No wheezing, rhonchi or rales.  Neurological:     Mental Status: He is alert and oriented to person, place, and time.     Medications  Outpatient Encounter Medications as of 12/06/2020  Medication Sig   carvedilol (COREG) 12.5 MG tablet Take 1 tablet (12.5 mg total) by mouth 2 (two) times daily with a meal.   digoxin (LANOXIN) 0.125 MG tablet TAKE 1 TABLET BY MOUTH EVERY DAY   empagliflozin (JARDIANCE) 10 MG TABS tablet Take 10 mg by mouth daily before breakfast.   ferrous sulfate 325 (65 FE) MG tablet TAKE 1 TABLET BY MOUTH EVERY DAY   KLOR-CON M20 20 MEQ tablet Take 20 mEq by mouth daily.   Magnesium Oxide 400 MG CAPS Take 1 capsule (400 mg total) by mouth 2 (two) times daily.   Multiple Vitamin (MULTIVITAMIN) tablet Take 1 tablet by mouth daily.   sacubitril-valsartan (ENTRESTO) 49-51 MG Take 1 tablet by mouth 2 (two) times daily.   spironolactone (ALDACTONE) 25 MG tablet TAKE 1 TABLET BY  MOUTH EVERY DAY   torsemide (DEMADEX) 20 MG tablet Take 0.5 tablets (10 mg total) by mouth daily.   No facility-administered encounter medications on file as of 12/06/2020.     History: Past Medical History:  Diagnosis Date   Arthritis    hands   GERD (gastroesophageal reflux disease)    Hypertension    Hypomagnesemia    Hyponatremia    Prostate cancer [facility])    Past Surgical History:  Procedure Laterality Date   LYMPHADENECTOMY Bilateral 01/26/2014   Procedure: LYMPHADENECTOMY WITH INDOCYANINE GREEN DYE;  Surgeon: Sebastian Ache, MD;  Location: [facility] ORS;  Service: Urology;  Laterality: Bilateral;   RIGHT/LEFT HEART CATH AND CORONARY ANGIOGRAPHY N/A 02/25/2020   Procedure: RIGHT/LEFT HEART CATH AND CORONARY ANGIOGRAPHY;  Surgeon: Kathleene Hazel, MD;  Location: [facility] INVASIVE CV LAB;  Service: Cardiovascular;  Laterality: N/A;   ROBOT ASSISTED LAPAROSCOPIC RADICAL PROSTATECTOMY N/A 01/26/2014   Procedure: ROBOTIC ASSISTED LAPAROSCOPIC RADICAL PROSTATECTOMY;  Surgeon: Sebastian Ache, MD;  Location: [facility] ORS;  Service: Urology;  Laterality: N/A;   SHOULDER SURGERY Left     Family History  Problem Relation Age of Onset   Hypertension Mother    Hypertension Father    Social History   Occupational History  Not on file  Tobacco Use   Smoking status: Never Smoker   Smokeless tobacco: Never Used  Vaping Use   Vaping Use: Never used  Substance and Sexual Activity   Alcohol use: Yes    Comment: 1-3 beers daily   Drug use: No   Sexual activity: Yes   Tobacco Counseling Counseling given: Not Answered   Immunizations and Health Maintenance Immunization History  Administered Date(s) Administered   Influenza,inj,Quad PF,6+ Mos 09/12/2020   PFIZER SARS-COV-2 Vaccination 02/24/2020, 03/21/2020   There are no preventive care reminders to display for this patient.  Activities of Daily Living In your present state of health, do you have any difficulty  performing the following activities: 12/06/2020 03/26/2020  Hearing? N N  Vision? N N  Difficulty concentrating or making decisions? Y N  Walking or climbing stairs? N N  Dressing or bathing? N N  Doing errands, shopping? N N  Some recent data might be hidden     Advanced Directives: patient will bring in copy.  Does Patient Have a Medical Advance Directive?: Yes Type of Advance Directive: [place] will Does patient want to make changes to medical advance directive?: No - Patient declined Copy of [place] in Chart?: No - copy requested    Assessment:    This is a routine wellness  examination for this patient .  Vision/Hearing screen  Hearing Screening   125Hz  250Hz  500Hz  1000Hz  2000Hz  3000Hz  4000Hz  6000Hz  8000Hz   Right ear:   Pass Pass Pass Pass Pass    Left ear:   Pass Pass Pass Pass Pass      Visual Acuity Screening   Right eye Left eye Both eyes  Without correction: 20/50 20/50 20/30   With correction:       Dietary issues and exercise activities discussed:     Goals     Lifestyle Change-Hypertension     Notes: He wants to live healthy. 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 He also wants to continue healthy lifestyle.        Depression Screen PHQ 2/9 Scores 12/06/2020 12/06/2020 09/12/2020 07/19/2019  PHQ - 2 Score 0 0 0 0     Fall Risk Fall Risk  12/06/2020  Falls in the past year? 0  Number falls in past yr: 0  Injury with Fall? 0  Risk for fall due to : No Fall Risks  Follow up Falls evaluation completed;Falls prevention discussed    Cognitive Function MMSE - Mini Mental State Exam 12/06/2020  Orientation to time 5  Orientation to Place 5  Registration 3  Attention/ Calculation 5  Recall 3  Language- name 2 objects 2  Language- repeat 1  Language- follow 3 step command 3  Language- read & follow direction 1  Write a  sentence 1  Copy design 1  Total score 30     6CIT Screen 12/06/2020  What Year? 0 points  What month? 0 points  What time? 0 points  Count back from 20 0 points  Months in reverse 4 points  Repeat phrase 4 points  Total Score 8    Patient Care Team: [name], FNP as PCP - General ([place]) Donato Heinz, MD as PCP - Cardiology (Cardiology)     Plan:    The annual wellness visit was performed including discussion of advanced directives, assessment of functional status and cognitive  function.    I have personally reviewed and noted the following in the patients chart:    Medical and social history  Use of alcohol, tobacco or illicit drugs   Current medications and supplements  Functional ability and status  Nutritional status  Physical activity  Advanced directives  List of other physicians  Hospitalizations, surgeries, and ER visits in previous 12 months  Vitals  Screenings to include cognitive, depression, and falls  Referrals and appointments  In addition, I have reviewed and discussed with patient certain preventive protocols, quality metrics, and best practice recommendations. A written personalized care plan for preventive services as well as general preventive health recommendations were provided to patient.    Charlesetta Ivory, NP 12/06/2020

## 2020-12-06 NOTE — Patient Instructions (Signed)
°  Austin [name] , Thank you for taking time to come for your Medicare Wellness Visit. I appreciate your ongoing commitment to your health goals. Please review the following plan we discussed and let me know if I can assist you in the future.   These are the goals we discussed: Goals   None     This is a list of the screening recommended for you and due dates:  Health Maintenance  Topic Date Due   Tetanus Vaccine  Never done   COVID-19 Vaccine (3 - Pfizer risk 4-dose series) 04/18/2020   Pneumonia vaccines (1 of 2 - PCV13) Never done   Colon Cancer Screening  02/08/2025   Flu Shot  Completed    Hepatitis C: One time screening is recommended by Center for Disease Control  (CDC) for  adults born from [birth year] through 1966.   Completed   HIV Screening  Completed

## 2020-12-07 LAB — CMP14+EGFR
ALT: 11 IU/L (ref 0–44)
AST: 29 IU/L (ref 0–40)
Albumin/Globulin Ratio: 1 — ABNORMAL LOW (ref 1.2–2.2)
Albumin: 4.2 g/dL (ref 3.8–4.8)
Alkaline Phosphatase: 106 IU/L (ref 44–121)
BUN/Creatinine Ratio: 9 — ABNORMAL LOW (ref 10–24)
BUN: 10 mg/dL (ref 8–27)
Bilirubin Total: <0.2 mg/dL (ref 0.0–1.2)
CO2: 25 mmol/L (ref 20–29)
Calcium: 9.5 mg/dL (ref 8.6–10.2)
Chloride: 98 mmol/L (ref 96–106)
Creatinine, Ser: 1.14 mg/dL (ref 0.76–1.27)
GFR calc Af Amer: 78 mL/min/{1.73_m2} (ref >59)
GFR calc non Af Amer: 67 mL/min/{1.73_m2} (ref >59)
Globulin, Total: 4.1 g/dL (ref 1.5–4.5)
Glucose: 86 mg/dL (ref 65–99)
Potassium: 4.3 mmol/L (ref 3.5–5.2)
Sodium: 136 mmol/L (ref 134–144)
Total Protein: 8.3 g/dL (ref 6.0–8.5)

## 2020-12-07 LAB — HEMOGLOBIN A1C
Est. average glucose Bld gHb Est-mCnc: 111 mg/dL
Hgb A1c MFr Bld: 5.5 % (ref 4.8–5.6)

## 2020-12-11 ENCOUNTER — Telehealth: Payer: Self-pay

## 2020-12-11 NOTE — Telephone Encounter (Signed)
-----   Message from Charlesetta Ivory, NP sent at 12/08/2020 12:16 PM EST ----- Your labs are stable. Continue to stay hydrated. You had glucose in your urinalysis. This is probably due to the medication you are taking for your diabetes. However, your Hgb A1c is within the normal range which is good. Continue to practice a healthy lifestyle by eating healthy and exercising. Happy New Years!

## 2020-12-11 NOTE — Telephone Encounter (Signed)
Left the patient a message to call back for lab results. 

## 2020-12-13 ENCOUNTER — Ambulatory Visit: Payer: Medicare Other | Admitting: Nurse Practitioner

## 2020-12-14 DIAGNOSIS — Z20822 Contact with and (suspected) exposure to covid-19: Secondary | ICD-10-CM | POA: Diagnosis not present

## 2020-12-28 ENCOUNTER — Other Ambulatory Visit (HOSPITAL_COMMUNITY): Payer: Self-pay | Admitting: Cardiology

## 2020-12-28 ENCOUNTER — Other Ambulatory Visit: Payer: Self-pay

## 2020-12-28 ENCOUNTER — Ambulatory Visit (HOSPITAL_COMMUNITY)
Admission: RE | Admit: 2020-12-28 | Discharge: 2020-12-28 | Disposition: A | Discharge status: Home / Self Care | Payer: Medicare Other | Source: Ambulatory Visit | Attending: Cardiology | Admitting: Cardiology

## 2020-12-28 ENCOUNTER — Telehealth (HOSPITAL_COMMUNITY): Payer: Self-pay | Admitting: *Deleted

## 2020-12-28 ENCOUNTER — Telehealth (HOSPITAL_COMMUNITY): Payer: Self-pay | Admitting: Pharmacist

## 2020-12-28 VITALS — BP 104/72 | HR 87 | Wt 159.5 lb

## 2020-12-28 DIAGNOSIS — Z8249 Family history of ischemic heart disease and other diseases of the circulatory system: Secondary | ICD-10-CM | POA: Diagnosis not present

## 2020-12-28 DIAGNOSIS — I428 Other cardiomyopathies: Secondary | ICD-10-CM | POA: Diagnosis not present

## 2020-12-28 DIAGNOSIS — Z79899 Other long term (current) drug therapy: Secondary | ICD-10-CM | POA: Insufficient documentation

## 2020-12-28 DIAGNOSIS — I110 Hypertensive heart disease with heart failure: Secondary | ICD-10-CM | POA: Insufficient documentation

## 2020-12-28 DIAGNOSIS — F1010 Alcohol abuse, uncomplicated: Secondary | ICD-10-CM | POA: Diagnosis not present

## 2020-12-28 DIAGNOSIS — I5042 Chronic combined systolic (congestive) and diastolic (congestive) heart failure: Secondary | ICD-10-CM

## 2020-12-28 DIAGNOSIS — I5022 Chronic systolic (congestive) heart failure: Secondary | ICD-10-CM | POA: Insufficient documentation

## 2020-12-28 LAB — DIGOXIN LEVEL: Digoxin Level: 1.1 ng/mL (ref 0.8–2.0)

## 2020-12-28 LAB — BASIC METABOLIC PANEL
Anion gap: 14 (ref 5–15)
BUN: 17 mg/dL (ref 8–23)
CO2: 23 mmol/L (ref 22–32)
Calcium: 9.2 mg/dL (ref 8.9–10.3)
Chloride: 95 mmol/L — ABNORMAL LOW (ref 98–111)
Creatinine, Ser: 1.62 mg/dL — ABNORMAL HIGH (ref 0.61–1.24)
GFR, Estimated: 47 mL/min — ABNORMAL LOW (ref >60)
Glucose, Bld: 104 mg/dL — ABNORMAL HIGH (ref 70–99)
Potassium: 4 mmol/L (ref 3.5–5.1)
Sodium: 132 mmol/L — ABNORMAL LOW (ref 135–145)

## 2020-12-28 MED ORDER — ENTRESTO 97-103 MG PO TABS: 1.0000 | ORAL_TABLET | Freq: Two times a day (BID) | ORAL | 3 refills | Status: DC

## 2020-12-28 MED ORDER — DIGOXIN 125 MCG PO TABS: 0.0625 mg | ORAL_TABLET | Freq: Every day | ORAL | 1 refills | Status: DC

## 2020-12-28 NOTE — Telephone Encounter (Signed)
Entresto refill sent to [employer]. Patient receives through [employer] patient assistance.   Audry Riles, PharmD, BCPS, BCCP, CPP Heart Failure Clinic Pharmacist [phone]

## 2020-12-28 NOTE — Telephone Encounter (Signed)
-----   Message from Larey Dresser, MD sent at 12/28/2020  4:52 PM EST ----- Stop torsemide with elevated creatinine.  Decrease digoxin to 0.0625 daily.  BMET 10 days.

## 2020-12-28 NOTE — Telephone Encounter (Signed)
Harvie Junior, [place]  12/28/2020 5:09 PM EST Back to Top     Pt and wife aware and verbalized understanding. Lab appt already scheduled.    Larey Dresser, MD  12/28/2020 4:52 PM EST      Stop torsemide with elevated creatinine. Decrease digoxin to 0.0625 daily. BMET 10 days.

## 2020-12-28 NOTE — Progress Notes (Signed)
PCP: [name], FNP Cardiology: Dr. Gardiner Rhyme HF Cardiology: Dr. Aundra Dubin  65 y.o. with history of ETOH abuse and prior HTN returns for followup of nonischemic cardiomyopathy. Patient had no known prior cardiac history.  In 2/21, he developed exertional dyspnea and was noted at an urgent care center to have CHF.  Echo in 3/21 showed EF < 20% with severe RV dysfunction.  RHC/LHC in 3/21 showed no CAD but R > L filling pressure elevation and preserved cardiac index.  He was admitted in 4/21 with acute/chronic systolic CHF.  He was diuresed and cardiac meds were adjusted.  Cardiac MRI this admission showed EF 14%, moderate LV dilation, severely decreased RV function, and moderate pericardial effusion. There was no evidence for myocarditis noted.   Patient, of note, has a history of heavy ETOH intake for decades. Per his wife, he would drink heavily every day after getting home from work and especially heavily on the weekend. He has a history of ETOH withdrawal.  He does have some family history of CHF (sister, maybe his mother), but he does not have a lot of detail about these diagnoses. No drugs (cocaine, amphetamines).   Echo in 8/21 showed EF 30-35%, diffuse hypokinesis, normal RV, no pericardial effusion. Echo in 10/21 showed EF 45-50% with global hypokinesis.   Patient has been doing well symptomatically.  Weight is up about 6 lbs but he is wearing heavy clothing today. He got short of breath shoveling snow earlier this week, but generally no dyspnea with ADLs or walking on flat ground.  No lightheadedness. No chest pain.  No orthopnea/PND.  He has cut way back on ETOH use.     Labs (4/21): HIV negative, K 4.2, creatinine 0.93 Labs (5/21): K 4.2, creatinine 1.23, digoxin 0.6 Labs (7/21): K 3.8, creatinine 1.49 Labs (10/21): K 4.2, creatinine 1.25, BNP 14 Labs (12/21): K 4.3, creatinine 1.14  ECG (personally reviewed): NSR, LVH, LAFB  PMH: 1. Prostate cancer 2. HTN: BP now low.  3. Fe  deficiency anemia 4. ETOH abuse 5. GERD 6. Chronic systolic CHF: Nonischemic cardiomyopathy.  - Echo 3/21 with EF < 20%, severe global HK, severely dilated and severely dysfunctional RV, severe biatrial enlargement.  - RHC/LHC (3/21): No CAD; mean RA 15, PA 51/24, mean PCWP 19, CI 3.96.  - HIV negative 4/21 - Cardiac MRI (4/21): LV EF 14%, moderate LV dilation, mild RV dilation with severely decreased systolic function, moderate pericardial effusion, no LGE noted but images difficult due to recent Fe infusion.  - Echo (8/21): EF 30-35%, diffuse hypokinesis, normal RV, no pericardial fluid.  - Echo (10/21): EF 45-50%, diffuse hypokinesis, normal RV.   FH: Sister with CHF, brother with pacemaker, mother with CHF.   SH: Married, lives in [place], heavy ETOH in the past has now cut back, no drugs.  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   ROS: All systems reviewed and negative except as per HPI.   Current Outpatient Medications  Medication Sig Dispense Refill  . carvedilol (COREG) 12.5 MG tablet Take 1 tablet (12.5 mg total) by mouth 2 (two) times daily with a meal. 60 tablet 6  . empagliflozin (JARDIANCE) 10 MG TABS tablet Take 10 mg by mouth daily before breakfast. 30 tablet 5  . ferrous sulfate 325 (65 FE) MG tablet TAKE 1 TABLET BY MOUTH EVERY DAY 90 tablet 1  . KLOR-CON M20 20 MEQ tablet Take 20 mEq by mouth daily.    . Magnesium Oxide 400 MG CAPS Take 1 capsule (400  mg total) by mouth 2 (two) times daily. 180 capsule 3  . Multiple Vitamin (MULTIVITAMIN) tablet Take 1 tablet by mouth daily.    Marland Kitchen spironolactone (ALDACTONE) 25 MG tablet TAKE 1 TABLET BY MOUTH EVERY DAY 90 tablet 1  . digoxin (LANOXIN) 0.125 MG tablet Take 0.5 tablets (0.0625 mg total) by mouth daily. 90 tablet 1  . sacubitril-valsartan (ENTRESTO) 97-103 MG Take 1 tablet by mouth 2 (two) times daily. 180 tablet 3   No current facility-administered medications for this encounter.   BP 104/72   Pulse 87   Wt 72.3 kg (159 lb 8  oz)   SpO2 97%   BMI 23.22 kg/m  General: NAD Neck: No JVD, no thyromegaly or thyroid nodule.  Lungs: Clear to auscultation bilaterally with normal respiratory effort. CV: Nondisplaced PMI.  Heart regular S1/S2, no S3/S4, no murmur.  No peripheral edema.  No carotid bruit.  Normal pedal pulses.  Abdomen: Soft, nontender, no hepatosplenomegaly, no distention.  Skin: Intact without lesions or rashes.  Neurologic: Alert and oriented x 3.  Psych: Normal affect. Extremities: No clubbing or cyanosis.  HEENT: Normal.   Assessment/Plan: 1. Chronic systolic CHF: Nonischemic cardiomyopathy.  Echo from 3/21 showed EF 20% with moderately decreased RV systolic function by my read.  RHC/LHC from 3/21 showed no significant coronary disease, preserved cardiac output, R>L heart failure.  Cardiac MRI in 4/21 showed EF 14%, severe RV dysfunction, no LGE.  HIV negative.  Patient has an extensive history of heavy ETOH intake; it is very possible that this is ETOH cardiomyopathy.  No LGE noted on cardiac MRI but difficult LGE images, cannot rule out prior myocarditis.  Family history is not fully defined.  He has not been noted to have frequent PVCs.  Echo in 10/21 showed EF improved to 45-50%.  NYHA class II.  He is not volume overloaded on exam.  He has cut back a lot on ETOH.  EF now out of ICD range.  - Continue empagliflozin 10 mg daily.    - Continue spironolactone 25 daily. BMET today.  - Increase Entresto to 97/103 bid.  BMET 10 days.   - Continue digoxin 0.125, check level today.  - Continue torsemide 10 mg daily.    - Continue Coreg 12.5 mg bid   - Narrow QRS, so would not be a CRT candidate.  - Repeat echo when he returns in 3 months.  2. ETOH abuse: Long history of heavy ETOH.  He has cut back considerably.  This may be the cause of his cardiomyopathy.   - I strongly encouraged him to keep ETOH minimal.    Followup in 3 months with echo.   [name] 12/28/2020

## 2020-12-28 NOTE — Patient Instructions (Signed)
Labs done today. We will contact you only if your labs are abnormal.  INCREASE Entresto to 97-103mg  (1 tablet) by mouth 2 times daily. (a new prescription was sent into the pharmacy for you.)  No other medication changes were made. Please continue all current medications as prescribed.  Your physician recommends that you schedule a follow-up appointment in: 10 days for a lab only appointment and in 3 months for an appointment with Dr. Aundra Dubin with an echo prior to your exam.   Your physician has requested that you have an echocardiogram. Echocardiography is a painless test that uses sound waves to create images of your heart. It provides your doctor with information about the size and shape of your heart and how well your heart's chambers and valves are working. This procedure takes approximately one hour. There are no restrictions for this procedure.   If you have any questions or concerns before your next appointment please send [place] a message through [place] or call our office at [phone].    TO LEAVE A MESSAGE FOR THE NURSE SELECT OPTION 2, PLEASE LEAVE A MESSAGE INCLUDING: . YOUR NAME . DATE OF BIRTH . CALL BACK NUMBER . REASON FOR CALL**this is important as we prioritize the call backs  YOU WILL RECEIVE A CALL BACK THE SAME DAY AS LONG AS YOU CALL BEFORE 4:00 PM   Do the following things EVERYDAY: 1) Weigh yourself in the morning before breakfast. Write it down and keep it in a log. 2) Take your medicines as prescribed 3) Eat low salt foods-Limit salt (sodium) to 2000 mg per day.  4) Stay as active as you can everyday 5) Limit all fluids for the day to less than 2 liters   At the [facility], you and your health needs are our priority. As part of our continuing mission to provide you with exceptional heart care, we have created designated Provider Care Teams. These Care Teams include your primary Cardiologist (physician) and Advanced Practice Providers (APPs-  Physician Assistants and Nurse Practitioners) who all work together to provide you with the care you need, when you need it.   You may see any of the following providers on your designated Care Team at your next follow up: Marland Kitchen Dr Glori Bickers . Dr [name] . Darrick Grinder, NP . Lyda Jester, PA . Audry Riles, PharmD   Please be sure to bring in all your medications bottles to every appointment.

## 2021-01-08 ENCOUNTER — Ambulatory Visit (HOSPITAL_COMMUNITY)
Admission: RE | Admit: 2021-01-08 | Discharge: 2021-01-08 | Disposition: A | Discharge status: Home / Self Care | Payer: Medicare Other | Source: Ambulatory Visit | Attending: Cardiology | Admitting: Cardiology

## 2021-01-08 ENCOUNTER — Other Ambulatory Visit: Payer: Self-pay

## 2021-01-08 DIAGNOSIS — I5042 Chronic combined systolic (congestive) and diastolic (congestive) heart failure: Secondary | ICD-10-CM | POA: Diagnosis not present

## 2021-01-08 LAB — BASIC METABOLIC PANEL WITH GFR
Anion gap: 12 (ref 5–15)
BUN: 12 mg/dL (ref 8–23)
CO2: 26 mmol/L (ref 22–32)
Calcium: 9.2 mg/dL (ref 8.9–10.3)
Chloride: 95 mmol/L — ABNORMAL LOW (ref 98–111)
Creatinine, Ser: 1.17 mg/dL (ref 0.61–1.24)
GFR, Estimated: >60 mL/min
Glucose, Bld: 104 mg/dL — ABNORMAL HIGH (ref 70–99)
Potassium: 3.9 mmol/L (ref 3.5–5.1)
Sodium: 133 mmol/L — ABNORMAL LOW (ref 135–145)

## 2021-01-23 ENCOUNTER — Telehealth (HOSPITAL_COMMUNITY): Payer: Self-pay | Admitting: Pharmacist

## 2021-01-23 NOTE — Telephone Encounter (Signed)
Advanced Heart Failure Patient Advocate Encounter   Patient was approved to receive Entresto from [employer]   Patient ID: [id number]  Effective dates: 01/19/2021 through 12/08/2021  Audry Riles, PharmD, BCPS, BCCP, CPP Heart Failure Clinic Pharmacist [phone]

## 2021-01-26 ENCOUNTER — Telehealth (HOSPITAL_COMMUNITY): Payer: Self-pay | Admitting: Licensed Clinical Social Worker

## 2021-01-26 NOTE — Telephone Encounter (Signed)
CSW received call from pt and wife requesting help getting a hold of DDS case worker to get update regarding pt case and see if they need additional documentation from our office.  Pt case ID: [id number]  CSW attempted to call pt case worker, Nickolas Madrid, but had to leave VM requesting return call  Will continue to follow and assist as needed  Jorge Ny, [place] Worker [facility] Desk#: [phone] Cell#: [phone]

## 2021-02-07 ENCOUNTER — Telehealth (HOSPITAL_COMMUNITY): Payer: Self-pay | Admitting: Licensed Clinical Social Worker

## 2021-02-07 NOTE — Telephone Encounter (Signed)
CSW received call from pt Disability Determination worker, Nickolas Madrid, requesting further documentation to finalize his case.  Release still on file for DDS so CSW sent requested documents to [facility] for review.  Will continue to follow and assist as needed  Jorge Ny, [facility] Desk#: [phone] Cell#: [phone]

## 2021-03-06 ENCOUNTER — Ambulatory Visit (INDEPENDENT_AMBULATORY_CARE_PROVIDER_SITE_OTHER): Payer: Medicare Other | Admitting: Nurse Practitioner

## 2021-03-06 ENCOUNTER — Other Ambulatory Visit: Payer: Self-pay

## 2021-03-06 ENCOUNTER — Encounter: Payer: Self-pay | Admitting: Nurse Practitioner

## 2021-03-06 VITALS — BP 132/94 | HR 107 | Temp 98.1°F | Ht 69.5 in | Wt 162.4 lb

## 2021-03-06 DIAGNOSIS — D509 Iron deficiency anemia, unspecified: Secondary | ICD-10-CM

## 2021-03-06 DIAGNOSIS — I10 Essential (primary) hypertension: Secondary | ICD-10-CM | POA: Diagnosis not present

## 2021-03-06 NOTE — Progress Notes (Signed)
I,Katawbba Wiggins,acting as a Education administrator for [employer], FNP.,have documented all relevant documentation on the behalf of [name], FNP,as directed by  [name], FNP while in the presence of [name], [place].  This visit occurred during the SARS-CoV-2 public health emergency.  Safety protocols were in place, including screening questions prior to the visit, additional usage of staff PPE, and extensive cleaning of exam room while observing appropriate contact time as indicated for disinfecting solutions.  Subjective:     Patient ID: Austin [name] , male    DOB: 09-Feb-1956 , 65 y.o.   MRN: [id number]   Chief Complaint  Patient presents with  . Hypertension    HPI  Patient here for a blood pressure check and recheck on his iron. He is to follow up with Cardiology on 4/22. He ate pork chops, bacon and ham within the last 24 hours.    Hypertension This is a chronic problem. The current episode started more than 1 year ago. The problem is unchanged. The problem is controlled. Pertinent negatives include no anxiety, blurred vision, chest pain, headaches, palpitations or peripheral edema. There are no associated agents to hypertension. Risk factors for coronary artery disease include sedentary lifestyle. Past treatments include diuretics and angiotensin blockers. There are no compliance problems.  Hypertensive end-organ damage includes heart failure. There is no history of angina, kidney disease or CAD/MI. There is no history of chronic renal disease or coarctation of the aorta.  Anemia Presents for follow-up visit. There has been no palpitations. Past medical history includes heart failure. There is no history of chronic renal disease.     Past Medical History:  Diagnosis Date  . Arthritis    hands  . GERD (gastroesophageal reflux disease)   . Hypertension   . Hypomagnesemia   . Hyponatremia   . Prostate cancer [facility])      Family History  Problem Relation Age of Onset  .  Hypertension Mother   . Hypertension Father      Current Outpatient Medications:  .  carvedilol (COREG) 12.5 MG tablet, Take 1 tablet (12.5 mg total) by mouth 2 (two) times daily with a meal., Disp: 60 tablet, Rfl: 6 .  digoxin (LANOXIN) 0.125 MG tablet, Take 0.5 tablets (0.0625 mg total) by mouth daily., Disp: 90 tablet, Rfl: 1 .  empagliflozin (JARDIANCE) 10 MG TABS tablet, Take 10 mg by mouth daily before breakfast., Disp: 30 tablet, Rfl: 5 .  ferrous sulfate 325 (65 FE) MG tablet, TAKE 1 TABLET BY MOUTH EVERY DAY, Disp: 90 tablet, Rfl: 1 .  KLOR-CON M20 20 MEQ tablet, Take 20 mEq by mouth daily., Disp: , Rfl:  .  Magnesium Oxide 400 MG CAPS, Take 1 capsule (400 mg total) by mouth 2 (two) times daily., Disp: 180 capsule, Rfl: 3 .  Multiple Vitamin (MULTIVITAMIN) tablet, Take 1 tablet by mouth daily., Disp: , Rfl:  .  spironolactone (ALDACTONE) 25 MG tablet, TAKE 1 TABLET BY MOUTH EVERY DAY, Disp: 90 tablet, Rfl: 1 .  LOSARTAN POTASSIUM PO, , Disp: , Rfl:  .  sacubitril-valsartan (ENTRESTO) 97-103 MG, TAKE 1 TABLET BY MOUTH 2 (TWO) TIMES DAILY., Disp: 180 tablet, Rfl: 3   Allergies  Allergen Reactions  . Latex Swelling and Rash     Review of Systems  Constitutional: Negative.   Eyes: Negative for blurred vision.  Respiratory: Negative.   Cardiovascular: Negative.  Negative for chest pain, palpitations and leg swelling.  Gastrointestinal: Negative.   Neurological: Negative for headaches.  Psychiatric/Behavioral:  Negative.   All other systems reviewed and are negative.    Today's Vitals   03/06/21 1410  BP: (!) 132/94  Pulse: (!) 107  Temp: 98.1 F (36.7 C)  TempSrc: Oral  Weight: 162 lb 6.4 oz (73.7 kg)  Height: 5' 9.5" (1.765 m)  PainSc: 0-No pain   Body mass index is 23.64 kg/m.  Wt Readings from Last 3 Encounters:  03/06/21 162 lb 6.4 oz (73.7 kg)  12/28/20 159 lb 8 oz (72.3 kg)  12/06/20 159 lb 12.8 oz (72.5 kg)   Objective:  Physical Exam Vitals reviewed.   Constitutional:      General: He is not in acute distress.    Appearance: Normal appearance.  Cardiovascular:     Rate and Rhythm: Normal rate and regular rhythm.     Pulses: Normal pulses.     Heart sounds: Normal heart sounds. No murmur heard.   Pulmonary:     Effort: Pulmonary effort is normal. No respiratory distress.     Breath sounds: Normal breath sounds. No wheezing.  Musculoskeletal:        General: No swelling or tenderness.     Right lower leg: No edema.     Left lower leg: No edema.  Neurological:     General: No focal deficit present.     Mental Status: He is alert and oriented to person, place, and time.     Cranial Nerves: No cranial nerve deficit.  Psychiatric:        Mood and Affect: Mood normal.        Behavior: Behavior normal.        Thought Content: Thought content normal.        Judgment: Judgment normal.         Assessment And Plan:     1. Essential hypertension  Chronic, blood pressure is slightly elevated  Encouraged to continue current medications and routine follow up with Cardiology - BMP8+EGFR  2. Iron deficiency anemia, unspecified iron deficiency anemia type  He continues taking iron supplement   Will check iron studies - Iron, TIBC and Ferritin Panel     Patient was given opportunity to ask questions. Patient verbalized understanding of the plan and was able to repeat key elements of the plan. All questions were answered to their satisfaction.  [name], FNP   I, [name], FNP, have reviewed all documentation for this visit. The documentation on 03/06/21 for the exam, diagnosis, procedures, and orders are all accurate and complete.   IF YOU HAVE BEEN REFERRED TO A SPECIALIST, IT MAY TAKE 1-2 WEEKS TO SCHEDULE/PROCESS THE REFERRAL. IF YOU HAVE NOT HEARD FROM US/SPECIALIST IN TWO WEEKS, PLEASE GIVE [place] A CALL AT [phone].   THE PATIENT IS ENCOURAGED TO PRACTICE SOCIAL DISTANCING DUE TO THE COVID-19 PANDEMIC.

## 2021-03-06 NOTE — Patient Instructions (Signed)

## 2021-03-07 LAB — BMP8+EGFR
BUN/Creatinine Ratio: 10 (ref 10–24)
BUN: 14 mg/dL (ref 8–27)
CO2: 20 mmol/L (ref 20–29)
Calcium: 9.1 mg/dL (ref 8.6–10.2)
Chloride: 99 mmol/L (ref 96–106)
Creatinine, Ser: 1.34 mg/dL — ABNORMAL HIGH (ref 0.76–1.27)
Glucose: 113 mg/dL — ABNORMAL HIGH (ref 65–99)
Potassium: 4.4 mmol/L (ref 3.5–5.2)
Sodium: 138 mmol/L (ref 134–144)
eGFR: 59 mL/min/{1.73_m2} — ABNORMAL LOW (ref >59)

## 2021-03-07 LAB — IRON,TIBC AND FERRITIN PANEL
Ferritin: 220 ng/mL (ref 30–400)
Iron Saturation: 36 % (ref 15–55)
Iron: 107 ug/dL (ref 38–169)
Total Iron Binding Capacity: 299 ug/dL (ref 250–450)
UIBC: 192 ug/dL (ref 111–343)

## 2021-03-30 ENCOUNTER — Ambulatory Visit (HOSPITAL_COMMUNITY)
Admission: RE | Admit: 2021-03-30 | Discharge: 2021-03-30 | Disposition: A | Discharge status: Home / Self Care | Payer: Medicare Other | Source: Ambulatory Visit | Attending: Cardiology | Admitting: Cardiology

## 2021-03-30 ENCOUNTER — Encounter (HOSPITAL_COMMUNITY): Payer: Self-pay | Admitting: Cardiology

## 2021-03-30 ENCOUNTER — Other Ambulatory Visit: Payer: Self-pay

## 2021-03-30 ENCOUNTER — Ambulatory Visit (HOSPITAL_BASED_OUTPATIENT_CLINIC_OR_DEPARTMENT_OTHER)
Admission: RE | Admit: 2021-03-30 | Discharge: 2021-03-30 | Disposition: A | Discharge status: Home / Self Care | Payer: Medicare Other | Source: Ambulatory Visit | Attending: Cardiology | Admitting: Cardiology

## 2021-03-30 VITALS — BP 130/80 | HR 82 | Wt 161.0 lb

## 2021-03-30 DIAGNOSIS — I5042 Chronic combined systolic (congestive) and diastolic (congestive) heart failure: Secondary | ICD-10-CM | POA: Diagnosis not present

## 2021-03-30 DIAGNOSIS — F1010 Alcohol abuse, uncomplicated: Secondary | ICD-10-CM | POA: Insufficient documentation

## 2021-03-30 DIAGNOSIS — I428 Other cardiomyopathies: Secondary | ICD-10-CM

## 2021-03-30 DIAGNOSIS — I110 Hypertensive heart disease with heart failure: Secondary | ICD-10-CM | POA: Insufficient documentation

## 2021-03-30 DIAGNOSIS — I351 Nonrheumatic aortic (valve) insufficiency: Secondary | ICD-10-CM | POA: Insufficient documentation

## 2021-03-30 HISTORY — DX: Heart failure, unspecified: I50.9

## 2021-03-30 LAB — ECHOCARDIOGRAM COMPLETE
Area-P 1/2: 2.69 cm2
P 1/2 time: 660 msec
S' Lateral: 4.1 cm
Single Plane A4C EF: 53.6 %

## 2021-03-30 LAB — BASIC METABOLIC PANEL
Anion gap: 11 (ref 5–15)
BUN: 8 mg/dL (ref 8–23)
CO2: 24 mmol/L (ref 22–32)
Calcium: 9.1 mg/dL (ref 8.9–10.3)
Chloride: 103 mmol/L (ref 98–111)
Creatinine, Ser: 1.14 mg/dL (ref 0.61–1.24)
GFR, Estimated: >60 mL/min (ref >60)
Glucose, Bld: 96 mg/dL (ref 70–99)
Potassium: 4.5 mmol/L (ref 3.5–5.1)
Sodium: 138 mmol/L (ref 135–145)

## 2021-03-30 LAB — DIGOXIN LEVEL: Digoxin Level: 0.4 ng/mL — ABNORMAL LOW (ref 0.8–2.0)

## 2021-03-30 MED ORDER — CARVEDILOL 12.5 MG PO TABS: 18.7500 mg | ORAL_TABLET | Freq: Two times a day (BID) | ORAL | 6 refills | Status: DC

## 2021-03-30 NOTE — Patient Instructions (Signed)
INCREASE Coreg to 18.75 mg, one and one half tab twice a day STOP Digoxin  Labs today We will only contact you if something comes back abnormal or we need to make some changes. Otherwise no news is good news!  Labs needed in 3 months   Your physician recommends that you schedule a follow-up appointment in: 6 months with Dr Aundra Dubin -please call our office for an appointment  Do the following things EVERYDAY: 1) Weigh yourself in the morning before breakfast. Write it down and keep it in a log. 2) Take your medicines as prescribed 3) Eat low salt foods--Limit salt (sodium) to 2000 mg per day.  4) Stay as active as you can everyday 5) Limit all fluids for the day to less than 2 liters  At the [facility], you and your health needs are our priority. As part of our continuing mission to provide you with exceptional heart care, we have created designated Provider Care Teams. These Care Teams include your primary Cardiologist (physician) and Advanced Practice Providers (APPs- Physician Assistants and Nurse Practitioners) who all work together to provide you with the care you need, when you need it.   You may see any of the following providers on your designated Care Team at your next follow up: Marland Kitchen Dr Glori Bickers . Dr [name] . Dr Vickki Muff . Darrick Grinder, NP . Lyda Jester, [place] . Audry Riles, PharmD   Please be sure to bring in all your medications bottles to every appointment.   If you have any questions or concerns before your next appointment please send [place] a message through [place] or call our office at [phone].    TO LEAVE A MESSAGE FOR THE NURSE SELECT OPTION 2, PLEASE LEAVE A MESSAGE INCLUDING: . YOUR NAME . DATE OF BIRTH . CALL BACK NUMBER . REASON FOR CALL**this is important as we prioritize the call backs  YOU WILL RECEIVE A CALL BACK THE SAME DAY AS LONG AS YOU CALL BEFORE 4:00 PM '

## 2021-03-30 NOTE — Progress Notes (Signed)
  Echocardiogram 2D Echocardiogram has been performed.  Matilde Bash 03/30/2021, 8:36 AM

## 2021-04-01 NOTE — Progress Notes (Signed)
PCP: [name], FNP Cardiology: Dr. Gardiner Rhyme HF Cardiology: Dr. Aundra Dubin  65 y.o. with history of ETOH abuse and prior HTN returns for followup of nonischemic cardiomyopathy. Patient had no known prior cardiac history.  In 2/21, he developed exertional dyspnea and was noted at an urgent care center to have CHF.  Echo in 3/21 showed EF < 20% with severe RV dysfunction.  RHC/LHC in 3/21 showed no CAD but R > L filling pressure elevation and preserved cardiac index.  He was admitted in 4/21 with acute/chronic systolic CHF.  He was diuresed and cardiac meds were adjusted.  Cardiac MRI this admission showed EF 14%, moderate LV dilation, severely decreased RV function, and moderate pericardial effusion. There was no evidence for myocarditis noted.   Patient, of note, has a history of heavy ETOH intake for decades. Per his wife, he would drink heavily every day after getting home from work and especially heavily on the weekend. He has a history of ETOH withdrawal.  He does have some family history of CHF (sister, maybe his mother), but he does not have a lot of detail about these diagnoses. No drugs (cocaine, amphetamines).   Echo in 8/21 showed EF 30-35%, diffuse hypokinesis, normal RV, no pericardial effusion. Echo in 10/21 showed EF 45-50% with global hypokinesis.  Echo in 3/22 with EF 45-50%, normal RV, PASP 34 mmHg.   No complaints today.  Not drinking much ETOH.  No significant exertional dyspnea.  No lightheadedness.  No orthopnea/PND.  No chest pain.  No palpitations. Weight up 2 lbs.     Labs (4/21): HIV negative, K 4.2, creatinine 0.93 Labs (5/21): K 4.2, creatinine 1.23, digoxin 0.6 Labs (7/21): K 3.8, creatinine 1.49 Labs (10/21): K 4.2, creatinine 1.25, BNP 14 Labs (12/21): K 4.3, creatinine 1.14 Labs (3/22): K 4.4, creatinine 1.34  PMH: 1. Prostate cancer 2. HTN: BP now low.  3. Fe deficiency anemia 4. ETOH abuse 5. GERD 6. Chronic systolic CHF: Nonischemic cardiomyopathy.  - Echo  3/21 with EF < 20%, severe global HK, severely dilated and severely dysfunctional RV, severe biatrial enlargement.  - RHC/LHC (3/21): No CAD; mean RA 15, PA 51/24, mean PCWP 19, CI 3.96.  - HIV negative 4/21 - Cardiac MRI (4/21): LV EF 14%, moderate LV dilation, mild RV dilation with severely decreased systolic function, moderate pericardial effusion, no LGE noted but images difficult due to recent Fe infusion.  - Echo (8/21): EF 30-35%, diffuse hypokinesis, normal RV, no pericardial fluid.  - Echo (10/21): EF 45-50%, diffuse hypokinesis, normal RV.  - Echo (3/22): EF 45-50%, normal RV, PASP 34 mmHg.   FH: Sister with CHF, brother with pacemaker, mother with CHF.   SH: Married, lives in [place], heavy ETOH in the past has now cut back, no drugs.  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   ROS: All systems reviewed and negative except as per HPI.   Current Outpatient Medications  Medication Sig Dispense Refill  . empagliflozin (JARDIANCE) 10 MG TABS tablet Take 10 mg by mouth daily before breakfast. 30 tablet 5  . ferrous sulfate 325 (65 FE) MG tablet TAKE 1 TABLET BY MOUTH EVERY DAY 90 tablet 1  . KLOR-CON M20 20 MEQ tablet Take 20 mEq by mouth daily.    . Magnesium Oxide 400 MG CAPS Take 1 capsule (400 mg total) by mouth 2 (two) times daily. 180 capsule 3  . Multiple Vitamin (MULTIVITAMIN) tablet Take 1 tablet by mouth daily.    . sacubitril-valsartan (ENTRESTO) 97-103 MG TAKE  1 TABLET BY MOUTH 2 (TWO) TIMES DAILY. 180 tablet 3  . spironolactone (ALDACTONE) 25 MG tablet TAKE 1 TABLET BY MOUTH EVERY DAY 90 tablet 1  . carvedilol (COREG) 12.5 MG tablet Take 1.5 tablets (18.75 mg total) by mouth 2 (two) times daily with a meal. 90 tablet 6   No current facility-administered medications for this encounter.   BP 130/80   Pulse 82   Wt 73 kg (161 lb)   SpO2 97%   BMI 23.43 kg/m  General: NAD Neck: No JVD, no thyromegaly or thyroid nodule.  Lungs: Clear to auscultation bilaterally with normal  respiratory effort. CV: Nondisplaced PMI.  Heart regular S1/S2, no S3/S4, no murmur.  No peripheral edema.  No carotid bruit.  Normal pedal pulses.  Abdomen: Soft, nontender, no hepatosplenomegaly, no distention.  Skin: Intact without lesions or rashes.  Neurologic: Alert and oriented x 3.  Psych: Normal affect. Extremities: No clubbing or cyanosis.  HEENT: Normal.   Assessment/Plan: 1. Chronic systolic CHF: Nonischemic cardiomyopathy.  Echo from 3/21 showed EF 20% with moderately decreased RV systolic function by my read.  RHC/LHC from 3/21 showed no significant coronary disease, preserved cardiac output, R>L heart failure.  Cardiac MRI in 4/21 showed EF 14%, severe RV dysfunction, no LGE.  HIV negative.  Patient has an extensive history of heavy ETOH intake; it is very possible that this is ETOH cardiomyopathy.  No LGE noted on cardiac MRI but difficult LGE images, cannot rule out prior myocarditis.  Family history is not fully defined.  He has not been noted to have frequent PVCs.  Echo in 10/21 showed EF improved to 45-50%, echo in 4/22 showed stable EF 45-50%.  NYHA class II.  He is not volume overloaded on exam.  He has cut back a lot on ETOH.  EF now out of ICD range.  - Continue empagliflozin 10 mg daily.    - Continue spironolactone 25 daily. BMET today.  - Continue Entresto 97/103 bid.  - Continue digoxin 0.125, check level today.  - Continue torsemide 10 mg daily.    - Increase Coreg to 18.75 mg bid.   - Narrow QRS, so would not be a CRT candidate.  - BMET every 3 months.  2. ETOH abuse: Long history of heavy ETOH.  He has cut back considerably.  This may be the cause of his cardiomyopathy.   - I strongly encouraged him to keep ETOH minimal.    BMET 3 months, followup 6 months.   [name] 04/01/2021   [name] 04/01/2021

## 2021-06-29 ENCOUNTER — Other Ambulatory Visit: Payer: Self-pay

## 2021-06-29 ENCOUNTER — Ambulatory Visit (HOSPITAL_COMMUNITY)
Admission: RE | Admit: 2021-06-29 | Discharge: 2021-06-29 | Disposition: A | Discharge status: Home / Self Care | Payer: Medicare Other | Source: Ambulatory Visit | Attending: Internal Medicine | Admitting: Internal Medicine

## 2021-06-29 DIAGNOSIS — I428 Other cardiomyopathies: Secondary | ICD-10-CM | POA: Insufficient documentation

## 2021-06-29 LAB — BASIC METABOLIC PANEL
Anion gap: 7 (ref 5–15)
BUN: 8 mg/dL (ref 8–23)
CO2: 26 mmol/L (ref 22–32)
Calcium: 9.2 mg/dL (ref 8.9–10.3)
Chloride: 101 mmol/L (ref 98–111)
Creatinine, Ser: 1.19 mg/dL (ref 0.61–1.24)
GFR, Estimated: >60 mL/min (ref >60)
Glucose, Bld: 103 mg/dL — ABNORMAL HIGH (ref 70–99)
Potassium: 3.9 mmol/L (ref 3.5–5.1)
Sodium: 134 mmol/L — ABNORMAL LOW (ref 135–145)

## 2021-07-09 ENCOUNTER — Other Ambulatory Visit: Payer: Self-pay

## 2021-07-09 ENCOUNTER — Encounter: Payer: Self-pay | Admitting: Nurse Practitioner

## 2021-07-09 ENCOUNTER — Ambulatory Visit (INDEPENDENT_AMBULATORY_CARE_PROVIDER_SITE_OTHER): Payer: Medicare Other | Admitting: Nurse Practitioner

## 2021-07-09 VITALS — BP 120/84 | HR 76 | Temp 98.5°F | Ht 67.2 in | Wt 157.2 lb

## 2021-07-09 DIAGNOSIS — I428 Other cardiomyopathies: Secondary | ICD-10-CM | POA: Diagnosis not present

## 2021-07-09 DIAGNOSIS — I10 Essential (primary) hypertension: Secondary | ICD-10-CM

## 2021-07-09 DIAGNOSIS — I110 Hypertensive heart disease with heart failure: Secondary | ICD-10-CM

## 2021-07-09 DIAGNOSIS — I5042 Chronic combined systolic (congestive) and diastolic (congestive) heart failure: Secondary | ICD-10-CM

## 2021-07-09 NOTE — Patient Instructions (Signed)

## 2021-07-09 NOTE — Progress Notes (Addendum)
I,Yamilka Roman [employer] as a Education administrator for [employer], FNP.,have documented all relevant documentation on the behalf of [name], FNP,as directed by  [name], FNP while in the presence of [name], [place].   This visit occurred during the SARS-CoV-2 public health emergency.  Safety protocols were in place, including screening questions prior to the visit, additional usage of staff PPE, and extensive cleaning of exam room while observing appropriate contact time as indicated for disinfecting solutions.  Subjective:     Patient ID: Austin [name] , male    DOB: 1956/02/11 , 65 y.o.   MRN: [id number]   Chief Complaint  Patient presents with   Hypertension    HPI  Patient here for a blood pressure check.  Continues to be followed by the Cardiologist once a year next appt in October for his CHF.   Wt Readings from Last 3 Encounters: 07/09/21 : 157 lb 3.2 oz (71.3 kg) 03/30/21 : 161 lb (73 kg) 03/06/21 : 162 lb 6.4 oz (73.7 kg)   Reports he does not eat well when out of town, and has been having multiple trips out of town.   Hypertension This is a chronic problem. The current episode started more than 1 year ago. The problem is unchanged. The problem is controlled. Pertinent negatives include no anxiety, blurred vision, chest pain, headaches, peripheral edema or shortness of breath. There are no associated agents to hypertension. Risk factors for coronary artery disease include sedentary lifestyle. Past treatments include diuretics and angiotensin blockers. There are no compliance problems.  There is no history of angina, kidney disease or CAD/MI. There is no history of coarctation of the aorta.    Past Medical History:  Diagnosis Date   Arthritis    hands   CHF (congestive heart failure) (HCC)    GERD (gastroesophageal reflux disease)    Hypertension    Hypomagnesemia    Hyponatremia    Prostate cancer ([place])      Family History  Problem Relation Age of Onset    Hypertension Mother    Hypertension Father      Current Outpatient Medications:    carvedilol (COREG) 12.5 MG tablet, Take 1.5 tablets (18.75 mg total) by mouth 2 (two) times daily with a meal., Disp: 90 tablet, Rfl: 6   empagliflozin (JARDIANCE) 10 MG TABS tablet, Take 10 mg by mouth daily before breakfast., Disp: 30 tablet, Rfl: 5   ferrous sulfate 325 (65 FE) MG tablet, TAKE 1 TABLET BY MOUTH EVERY DAY, Disp: 90 tablet, Rfl: 1   KLOR-CON M20 20 MEQ tablet, Take 20 mEq by mouth daily., Disp: , Rfl:    Magnesium Oxide 400 MG CAPS, Take 1 capsule (400 mg total) by mouth 2 (two) times daily., Disp: 180 capsule, Rfl: 3   Multiple Vitamin (MULTIVITAMIN) tablet, Take 1 tablet by mouth daily., Disp: , Rfl:    sacubitril-valsartan (ENTRESTO) 97-103 MG, TAKE 1 TABLET BY MOUTH 2 (TWO) TIMES DAILY., Disp: 180 tablet, Rfl: 3   spironolactone (ALDACTONE) 25 MG tablet, TAKE 1 TABLET BY MOUTH EVERY DAY, Disp: 90 tablet, Rfl: 1   Allergies  Allergen Reactions   Latex Swelling and Rash     Review of Systems  Constitutional: Negative.  Negative for fatigue.  Eyes:  Negative for blurred vision.  Respiratory: Negative.  Negative for shortness of breath and wheezing.   Cardiovascular:  Negative for chest pain and leg swelling.  Neurological:  Negative for dizziness and headaches.  Psychiatric/Behavioral: Negative.  Today's Vitals   07/09/21 1412  BP: 120/84  Pulse: 76  Temp: 98.5 F (36.9 C)  Weight: 157 lb 3.2 oz (71.3 kg)  Height: 5' 7.2" (1.707 m)  PainSc: 0-No pain   Body mass index is 24.47 kg/m.   Objective:  Physical Exam Vitals reviewed.  Constitutional:      General: He is not in acute distress.    Appearance: Normal appearance.  Cardiovascular:     Rate and Rhythm: Normal rate and regular rhythm.     Pulses: Normal pulses.     Heart sounds: Normal heart sounds. No murmur heard. Pulmonary:     Effort: Pulmonary effort is normal. No respiratory distress.     Breath  sounds: Normal breath sounds. No wheezing.  Musculoskeletal:        General: No swelling or tenderness.     Right lower leg: No edema.     Left lower leg: No edema.  Neurological:     General: No focal deficit present.     Mental Status: He is alert and oriented to person, place, and time.     Cranial Nerves: No cranial nerve deficit.     Motor: No weakness.  Psychiatric:        Mood and Affect: Mood normal.        Behavior: Behavior normal.        Thought Content: Thought content normal.        Judgment: Judgment normal.        Assessment And Plan:     1. Hypertensive 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 ([place]) Comments: Blood pressure is under good control Continue f/u with Cardiology  2. NICM (nonischemic cardiomyopathy) ([place]) Comments: Doing much better Continue current regimen by Cardiology  3. Chronic combined systolic and diastolic congestive heart failure ([place]) Comments: He is doing well, no edema Has had 4 lb weight loss since April      Patient was given opportunity to ask questions. Patient verbalized understanding of the plan and was able to repeat key elements of the plan. All questions were answered to their satisfaction.  [name], FNP   I, [name], FNP, have reviewed all documentation for this visit. The documentation on 07/18/21 for the exam, diagnosis, procedures, and orders are all accurate and complete.   IF YOU HAVE BEEN REFERRED TO A SPECIALIST, IT MAY TAKE 1-2 WEEKS TO SCHEDULE/PROCESS THE REFERRAL. IF YOU HAVE NOT HEARD FROM US/SPECIALIST IN TWO WEEKS, PLEASE GIVE [place] A CALL AT [phone].   THE PATIENT IS ENCOURAGED TO PRACTICE SOCIAL DISTANCING DUE TO THE COVID-19 PANDEMIC.

## 2021-08-28 IMAGING — US US ABDOMEN LIMITED
1 series · 14 of 25 positions shown · non-contrast
Comparison: None.

CLINICAL DATA: Elevated LFTs.

EXAM:
ULTRASOUND ABDOMEN LIMITED RIGHT UPPER QUADRANT

[Series 1: us abdomen limited · 0.17mm/px · 14 of 60 slices shown]
[im 1/60]
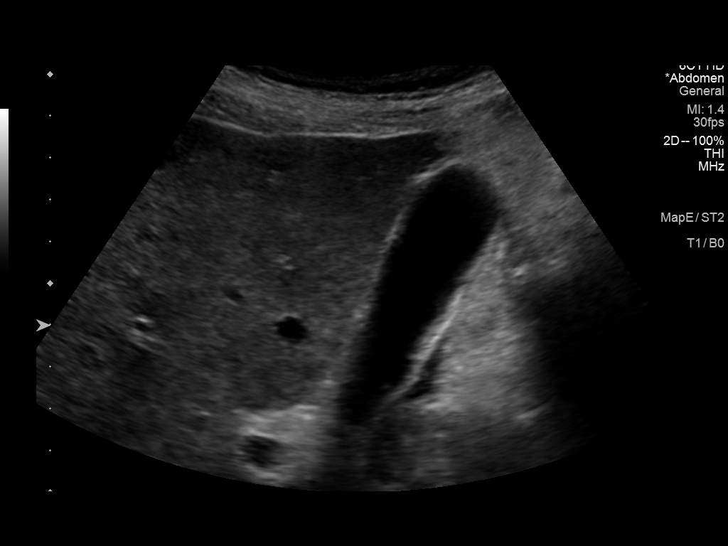
[im 5/60]
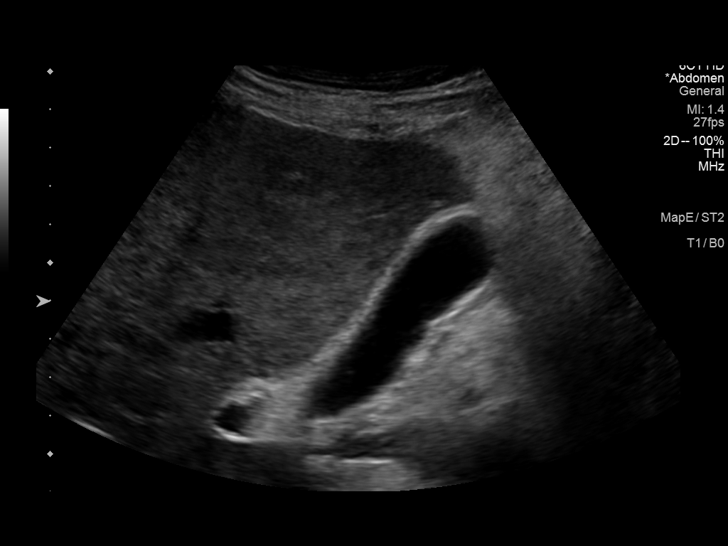
[im 10/60]
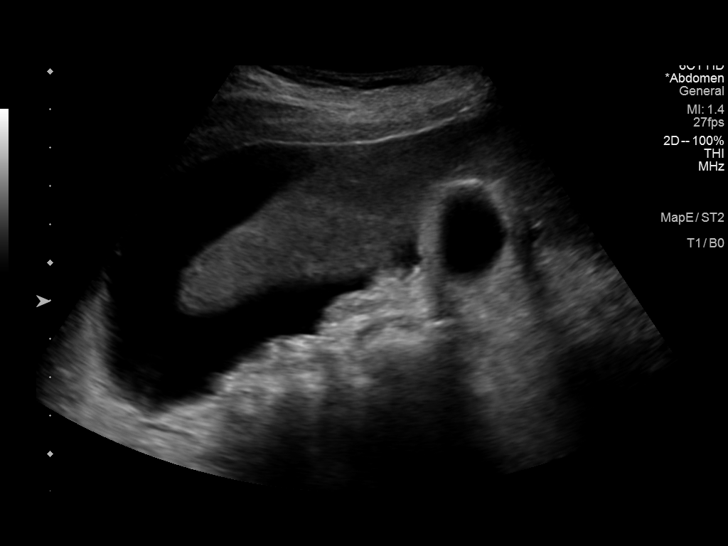
[im 15/60]
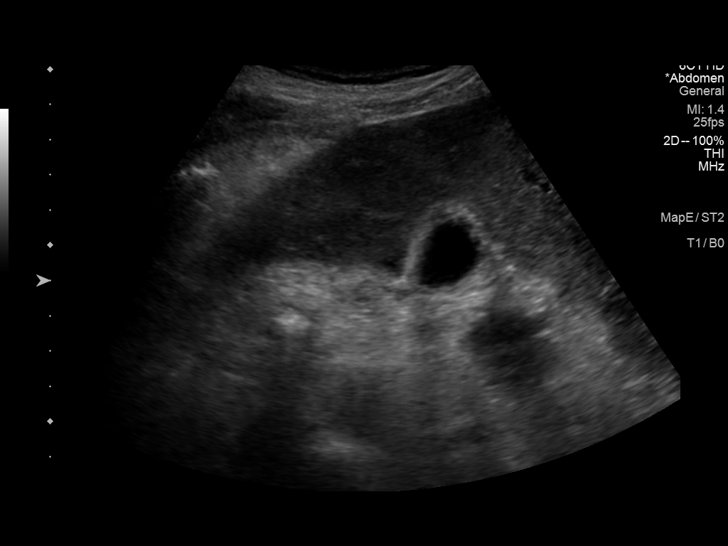
[im 20/60]
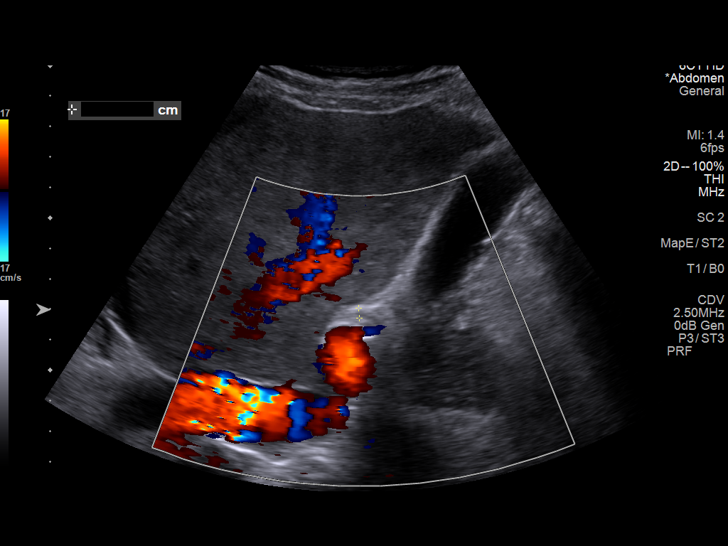
[im 23/60]
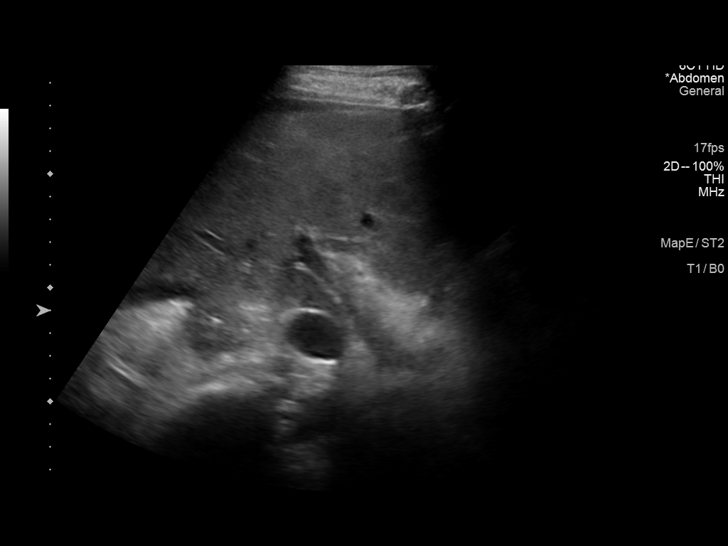
[im 28/60]
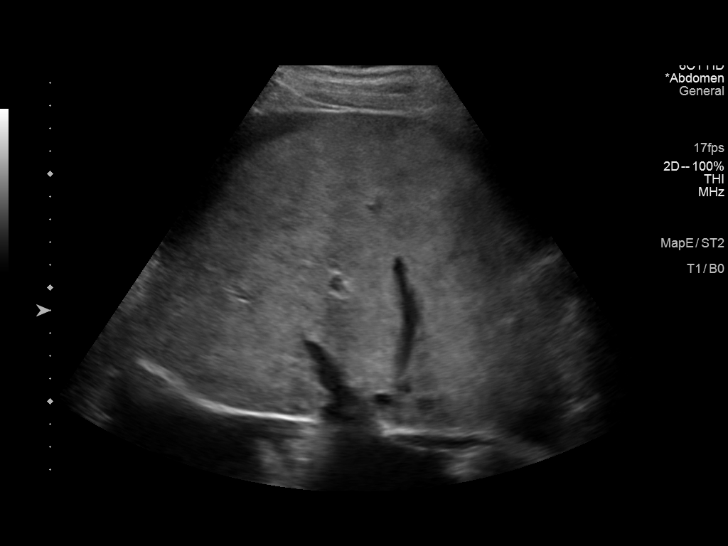
[im 32/60]
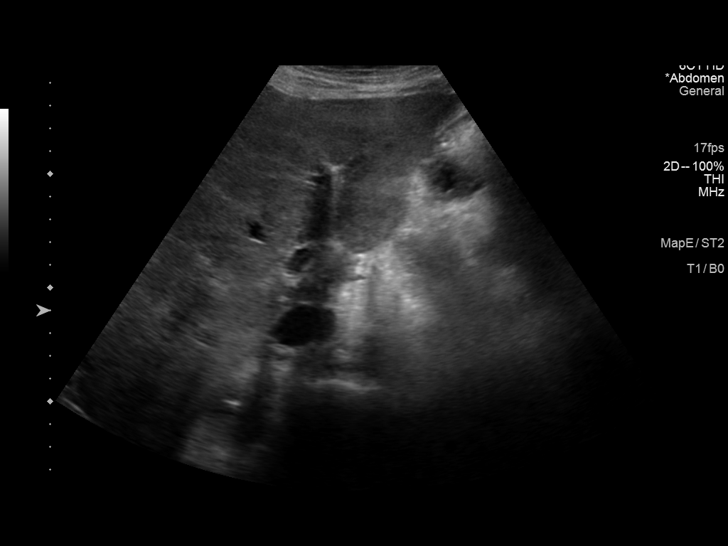
[im 37/60]
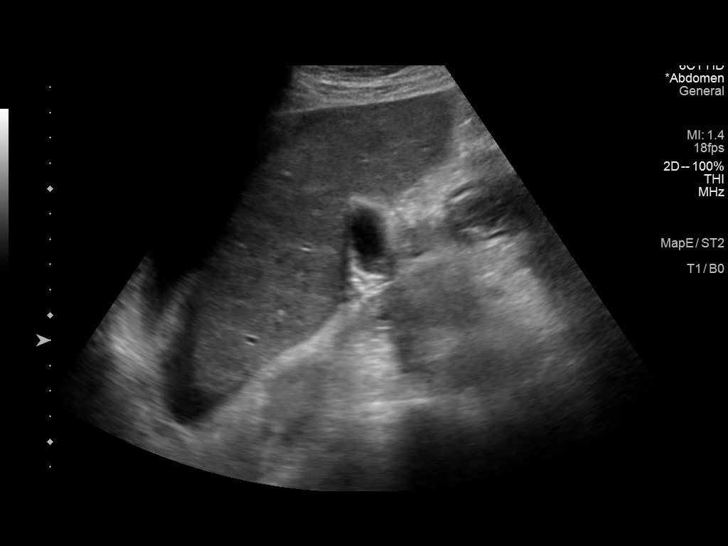
[im 40/60]
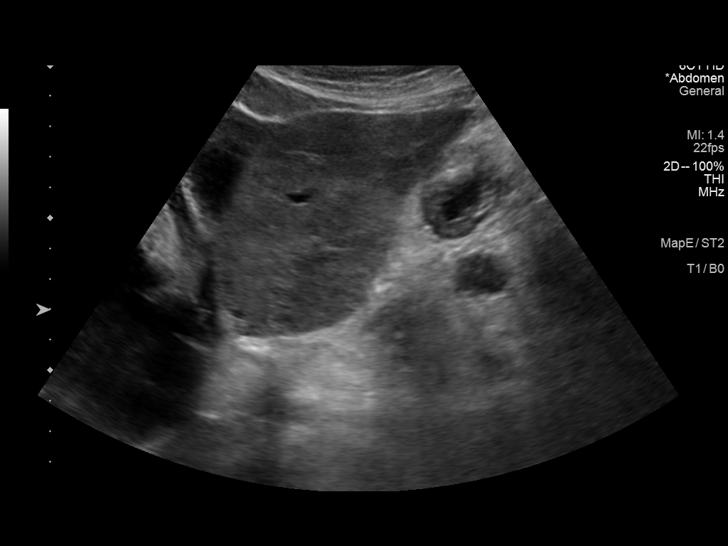
[im 45/60]
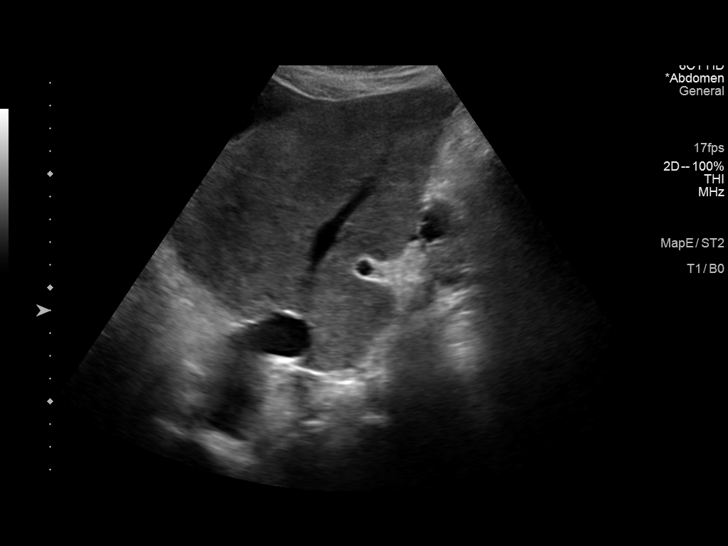
[im 50/60]
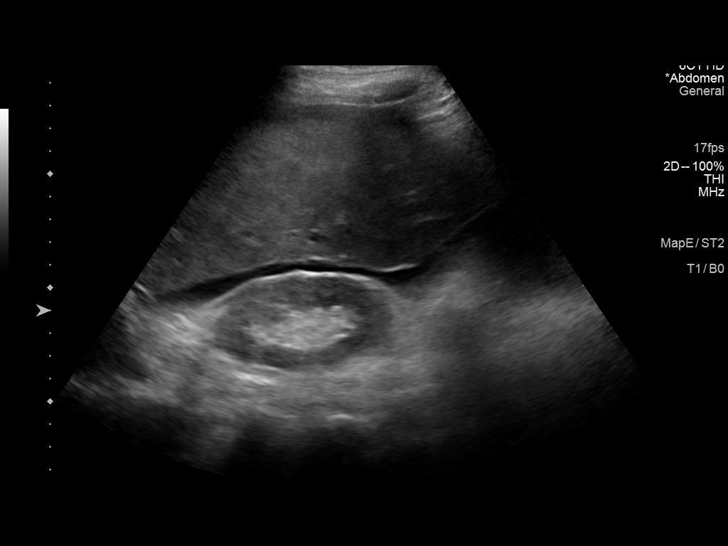
[im 55/60]
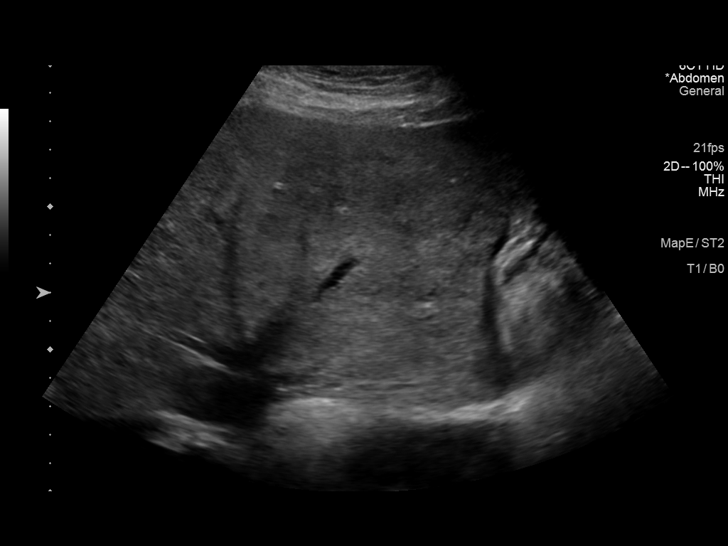
[im 60/60]
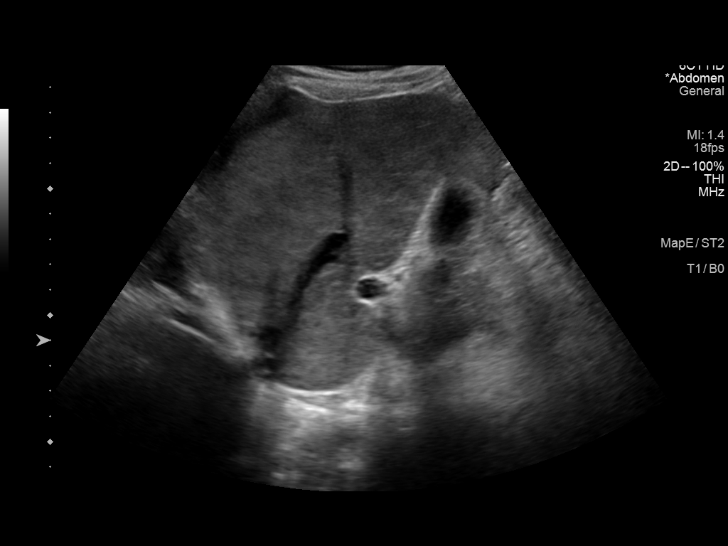

[14 of 25 positions shown; findings below may reference images not displayed]

FINDINGS: Gallbladder:

No gallstones or wall thickening visualized. No sonographic Murphy
sign noted by sonographer.

Common bile duct:

Diameter: 3 mm, normal.

Liver:

No focal lesion identified. Nodular contour with heterogeneously
increased parenchymal echogenicity. Portal vein is patent on color
Doppler imaging with normal direction of blood flow towards the
liver.

Other: Small perihepatic ascites.  Small right pleural effusion.
IMPRESSION: 1. Cirrhosis with small ascites.
2. Small right pleural effusion.

## 2021-08-31 IMAGING — DX DG CHEST 2V
1 series · 1 of 1 positions shown · non-contrast
Comparison: January 24, 2014

CLINICAL DATA: Shortness of breath.  Leg swelling.

EXAM:
CHEST - 2 VIEW

[chest pa]
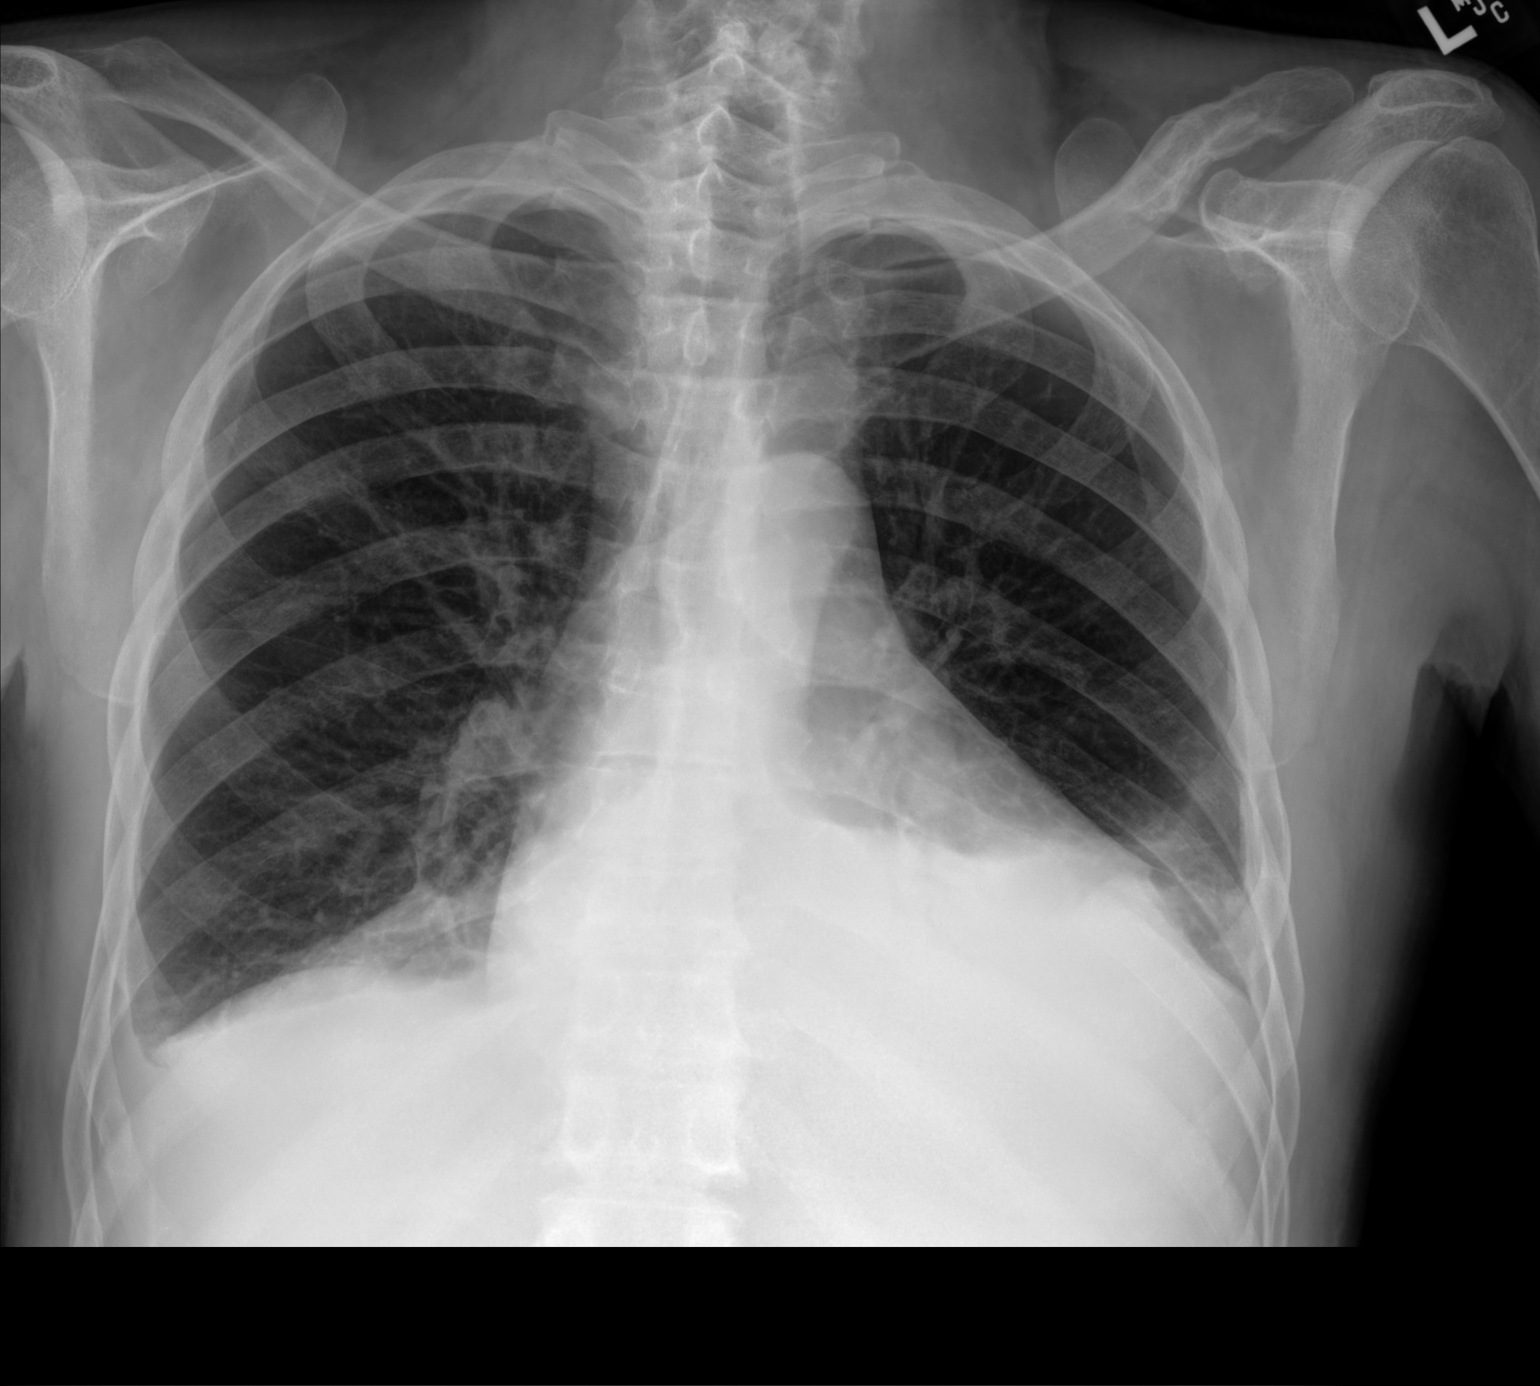

[1 of 1 positions shown; findings below may reference images not displayed]

FINDINGS: No pneumothorax. The cardiac silhouette appears enlarged, new since
4083. There is a small right effusion with underlying atelectasis.
There is a small left effusion with associated opacity in the left
base. The hila and mediastinum are normal. No pneumothorax.
IMPRESSION: 1. The cardiac silhouette is enlarged, new since 4083. This could
represent cardiomegaly or pericardial effusion.
2. There is a small left effusion with associated opacity. The
associated opacity could represent pneumonia, aspiration, or
atelectasis. Recommend clinical correlation.
3. Small right effusion with atelectasis.

## 2021-09-11 ENCOUNTER — Ambulatory Visit (HOSPITAL_COMMUNITY)
Admission: RE | Admit: 2021-09-11 | Discharge: 2021-09-11 | Disposition: A | Discharge status: Home / Self Care | Payer: Medicare Other | Source: Ambulatory Visit | Attending: Cardiology | Admitting: Cardiology

## 2021-09-11 ENCOUNTER — Encounter (HOSPITAL_COMMUNITY): Payer: Self-pay | Admitting: Cardiology

## 2021-09-11 ENCOUNTER — Other Ambulatory Visit: Payer: Self-pay

## 2021-09-11 VITALS — BP 120/70 | HR 103 | Wt 156.4 lb

## 2021-09-11 DIAGNOSIS — Z7984 Long term (current) use of oral hypoglycemic drugs: Secondary | ICD-10-CM | POA: Insufficient documentation

## 2021-09-11 DIAGNOSIS — I110 Hypertensive heart disease with heart failure: Secondary | ICD-10-CM | POA: Insufficient documentation

## 2021-09-11 DIAGNOSIS — Z8249 Family history of ischemic heart disease and other diseases of the circulatory system: Secondary | ICD-10-CM | POA: Insufficient documentation

## 2021-09-11 DIAGNOSIS — I428 Other cardiomyopathies: Secondary | ICD-10-CM | POA: Diagnosis not present

## 2021-09-11 DIAGNOSIS — I5022 Chronic systolic (congestive) heart failure: Secondary | ICD-10-CM | POA: Diagnosis not present

## 2021-09-11 DIAGNOSIS — F1010 Alcohol abuse, uncomplicated: Secondary | ICD-10-CM | POA: Insufficient documentation

## 2021-09-11 DIAGNOSIS — I5042 Chronic combined systolic (congestive) and diastolic (congestive) heart failure: Secondary | ICD-10-CM | POA: Diagnosis not present

## 2021-09-11 LAB — BASIC METABOLIC PANEL
Anion gap: 9 (ref 5–15)
BUN: 5 mg/dL — ABNORMAL LOW (ref 8–23)
CO2: 27 mmol/L (ref 22–32)
Calcium: 9.3 mg/dL (ref 8.9–10.3)
Chloride: 95 mmol/L — ABNORMAL LOW (ref 98–111)
Creatinine, Ser: 0.91 mg/dL (ref 0.61–1.24)
GFR, Estimated: >60 mL/min (ref >60)
Glucose, Bld: 103 mg/dL — ABNORMAL HIGH (ref 70–99)
Potassium: 3.8 mmol/L (ref 3.5–5.1)
Sodium: 131 mmol/L — ABNORMAL LOW (ref 135–145)

## 2021-09-11 LAB — BRAIN NATRIURETIC PEPTIDE: B Natriuretic Peptide: 71.8 pg/mL (ref 0.0–100.0)

## 2021-09-11 MED ORDER — CARVEDILOL 25 MG PO TABS: 25.0000 mg | ORAL_TABLET | Freq: Two times a day (BID) | ORAL | 3 refills | Status: DC

## 2021-09-11 NOTE — Progress Notes (Signed)
PCP: [name], FNP Cardiology: Dr. Gardiner Rhyme HF Cardiology: Dr. Aundra Dubin  65 y.o. with history of ETOH abuse and prior HTN returns for followup of nonischemic cardiomyopathy. Patient had no known prior cardiac history.  In 2/21, he developed exertional dyspnea and was noted at an urgent care center to have CHF.  Echo in 3/21 showed EF < 20% with severe RV dysfunction.  RHC/LHC in 3/21 showed no CAD but R > L filling pressure elevation and preserved cardiac index.  He was admitted in 4/21 with acute/chronic systolic CHF.  He was diuresed and cardiac meds were adjusted.  Cardiac MRI this admission showed EF 14%, moderate LV dilation, severely decreased RV function, and moderate pericardial effusion. There was no evidence for myocarditis noted.   Patient, of note, has a history of heavy ETOH intake for decades. Per his wife, he would drink heavily every day after getting home from work and especially heavily on the weekend. He has a history of ETOH withdrawal.  He does have some family history of CHF (sister, maybe his mother), but he does not have a lot of detail about these diagnoses. No drugs (cocaine, amphetamines).   Echo in 8/21 showed EF 30-35%, diffuse hypokinesis, normal RV, no pericardial effusion. Echo in 10/21 showed EF 45-50% with global hypokinesis.  Echo in 3/22 with EF 45-50%, normal RV, PASP 34 mmHg.   Patient continues to do well.  He is drinking ETOH rarely (1-2 drinks/week).  Weight down 5 lbs.  No significant exertional dyspnea. No orthopnea/PND. No lightheadedness.  No chest pain.  Overall, no limitation except with heavy exercise.      ECG (personally reviewed): sinus tachy 109, LVH  Labs (4/21): HIV negative, K 4.2, creatinine 0.93 Labs (5/21): K 4.2, creatinine 1.23, digoxin 0.6 Labs (7/21): K 3.8, creatinine 1.49 Labs (10/21): K 4.2, creatinine 1.25, BNP 14 Labs (12/21): K 4.3, creatinine 1.14 Labs (3/22): K 4.4, creatinine 1.34 Labs (7/22): K 3.9, creatinine  1.19  PMH: 1. Prostate cancer 2. HTN: BP now low.  3. Fe deficiency anemia 4. ETOH abuse 5. GERD 6. Chronic systolic CHF: Nonischemic cardiomyopathy.  - Echo 3/21 with EF < 20%, severe global HK, severely dilated and severely dysfunctional RV, severe biatrial enlargement.  - RHC/LHC (3/21): No CAD; mean RA 15, PA 51/24, mean PCWP 19, CI 3.96.  - HIV negative 4/21 - Cardiac MRI (4/21): LV EF 14%, moderate LV dilation, mild RV dilation with severely decreased systolic function, moderate pericardial effusion, no LGE noted but images difficult due to recent Fe infusion.  - Echo (8/21): EF 30-35%, diffuse hypokinesis, normal RV, no pericardial fluid.  - Echo (10/21): EF 45-50%, diffuse hypokinesis, normal RV.  - Echo (3/22): EF 45-50%, normal RV, PASP 34 mmHg.   FH: Sister with CHF, brother with pacemaker, mother with CHF.   SH: Married, lives in [place], heavy ETOH in the past has now cut back, no drugs.  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   ROS: All systems reviewed and negative except as per HPI.   Current Outpatient Medications  Medication Sig Dispense Refill   empagliflozin (JARDIANCE) 10 MG TABS tablet Take 10 mg by mouth daily before breakfast. 30 tablet 5   ferrous sulfate 325 (65 FE) MG tablet TAKE 1 TABLET BY MOUTH EVERY DAY 90 tablet 1   KLOR-CON M20 20 MEQ tablet Take 20 mEq by mouth daily.     Magnesium Oxide 400 MG CAPS Take 1 capsule (400 mg total) by mouth 2 (two) times daily.  180 capsule 3   Multiple Vitamin (MULTIVITAMIN) tablet Take 1 tablet by mouth daily.     sacubitril-valsartan (ENTRESTO) 97-103 MG TAKE 1 TABLET BY MOUTH 2 (TWO) TIMES DAILY. 180 tablet 3   spironolactone (ALDACTONE) 25 MG tablet TAKE 1 TABLET BY MOUTH EVERY DAY 90 tablet 1   carvedilol (COREG) 25 MG tablet Take 1 tablet (25 mg total) by mouth 2 (two) times daily with a meal. 180 tablet 3   No current facility-administered medications for this encounter.   BP 120/70   Pulse (!) 103   Wt 70.9 kg (156  lb 6.4 oz)   SpO2 97%   BMI 24.35 kg/m  General: NAD Neck: No JVD, no thyromegaly or thyroid nodule.  Lungs: Clear to auscultation bilaterally with normal respiratory effort. CV: Nondisplaced PMI.  Heart regular S1/S2, no S3/S4, no murmur.  No peripheral edema.  No carotid bruit.  Normal pedal pulses.  Abdomen: Soft, nontender, no hepatosplenomegaly, no distention.  Skin: Intact without lesions or rashes.  Neurologic: Alert and oriented x 3.  Psych: Normal affect. Extremities: No clubbing or cyanosis.  HEENT: Normal.   Assessment/Plan: 1. Chronic systolic CHF: Nonischemic cardiomyopathy.  Echo from 3/21 showed EF 20% with moderately decreased RV systolic function by my read.  RHC/LHC from 3/21 showed no significant coronary disease, preserved cardiac output, R>L heart failure.  Cardiac MRI in 4/21 showed EF 14%, severe RV dysfunction, no LGE.  HIV negative.  Patient has an extensive history of heavy ETOH intake; it is very possible that this is ETOH cardiomyopathy.  No LGE noted on cardiac MRI but difficult LGE images, cannot rule out prior myocarditis.  Family history is not fully defined.  He has not been noted to have frequent PVCs.  Echo in 10/21 showed EF improved to 45-50%, echo in 4/22 showed stable EF 45-50%.  NYHA class II.  He is not volume overloaded on exam.  He has cut back a lot on ETOH, only 1-2 beers/week now.  EF now out of ICD range.  - Continue empagliflozin 10 mg daily.    - Continue spironolactone 25 daily. BMET/BNP today.  - Continue Entresto 97/103 bid.  - No need for a loop diuretic.  - Increase Coreg to 25 mg bid.   - Narrow QRS, so would not be a CRT candidate.  - BMET every 3 months.  - Repeat echo in 6 months.  2. ETOH abuse: Long history of heavy ETOH.  He has cut back considerably.  This may be the cause of his cardiomyopathy.   - I strongly encouraged him to keep ETOH minimal.    BMET 3 months, followup 6 months with echo.   [name] 09/11/2021

## 2021-09-11 NOTE — Patient Instructions (Addendum)
EKG done today.   Labs done today. We will contact you only if your labs are abnormal.  INCREASE Carvedilol to 25mg  (1 tablet) by mouth 2 times daily.  No other medication changes were made. Please continue all current medications as prescribed.  Your physician recommends that you schedule a follow-up appointment in: 3 months for a lab only appointment and in 6 months with Dr. Aundra Dubin with an echo prior to your appointment. Please contact our office in March 2023 for a April 2023 appointment.    Your physician has requested that you have an echocardiogram. Echocardiography is a painless test that uses sound waves to create images of your heart. It provides your doctor with information about the size and shape of your heart and how well your heart's chambers and valves are working. This procedure takes approximately one hour. There are no restrictions for this procedure.  If you have any questions or concerns before your next appointment please send [place] a message through [facility] or call our office at [phone].    TO LEAVE A MESSAGE FOR THE NURSE SELECT OPTION 2, PLEASE LEAVE A MESSAGE INCLUDING: YOUR NAME DATE OF BIRTH CALL BACK NUMBER REASON FOR CALL**this is important as we prioritize the call backs  YOU WILL RECEIVE A CALL BACK THE SAME DAY AS LONG AS YOU CALL BEFORE 4:00 PM   Do the following things EVERYDAY: Weigh yourself in the morning before breakfast. Write it down and keep it in a log. Take your medicines as prescribed Eat low salt foods--Limit salt (sodium) to 2000 mg per day.  Stay as active as you can everyday Limit all fluids for the day to less than 2 liters   At the [facility], you and your health needs are our priority. As part of our continuing mission to provide you with exceptional heart care, we have created designated Provider Care Teams. These Care Teams include your primary Cardiologist (physician) and Advanced Practice Providers (APPs-  Physician Assistants and Nurse Practitioners) who all work together to provide you with the care you need, when you need it.   You may see any of the following providers on your designated Care Team at your next follow up: Dr Glori Bickers Dr Haynes Kerns, NP Lyda Jester, [place] Audry Riles, PharmD   Please be sure to bring in all your medications bottles to every appointment.

## 2021-12-12 ENCOUNTER — Other Ambulatory Visit: Payer: Self-pay

## 2021-12-12 ENCOUNTER — Ambulatory Visit (HOSPITAL_COMMUNITY)
Admission: RE | Admit: 2021-12-12 | Discharge: 2021-12-12 | Disposition: A | Discharge status: Home / Self Care | Payer: Medicare Other | Source: Ambulatory Visit | Attending: Cardiology | Admitting: Cardiology

## 2021-12-12 DIAGNOSIS — I5042 Chronic combined systolic (congestive) and diastolic (congestive) heart failure: Secondary | ICD-10-CM | POA: Diagnosis not present

## 2021-12-12 LAB — BASIC METABOLIC PANEL WITH GFR
Anion gap: 11 (ref 5–15)
BUN: 9 mg/dL (ref 8–23)
CO2: 24 mmol/L (ref 22–32)
Calcium: 9 mg/dL (ref 8.9–10.3)
Chloride: 93 mmol/L — ABNORMAL LOW (ref 98–111)
Creatinine, Ser: 1.32 mg/dL — ABNORMAL HIGH (ref 0.61–1.24)
GFR, Estimated: 59 mL/min — ABNORMAL LOW
Glucose, Bld: 82 mg/dL (ref 70–99)
Potassium: 4.4 mmol/L (ref 3.5–5.1)
Sodium: 128 mmol/L — ABNORMAL LOW (ref 135–145)

## 2021-12-13 ENCOUNTER — Telehealth (HOSPITAL_COMMUNITY): Payer: Self-pay

## 2021-12-13 DIAGNOSIS — I5042 Chronic combined systolic (congestive) and diastolic (congestive) heart failure: Secondary | ICD-10-CM

## 2021-12-13 NOTE — Telephone Encounter (Signed)
Pts wife aware of results. Advised of recommendations to cut back on fluids and etoh.  Lab visit scheduled.

## 2021-12-13 NOTE — Telephone Encounter (Signed)
-----   Message from Larey Dresser, MD sent at 12/12/2021 10:47 AM EST ----- Cut back on po fluid intake with low sodium (??ETOH). BMET 2 wks.

## 2021-12-27 ENCOUNTER — Other Ambulatory Visit (HOSPITAL_COMMUNITY): Payer: Medicare Other

## 2021-12-31 ENCOUNTER — Ambulatory Visit (HOSPITAL_COMMUNITY)
Admission: RE | Admit: 2021-12-31 | Discharge: 2021-12-31 | Disposition: A | Discharge status: Home / Self Care | Payer: Medicare Other | Source: Ambulatory Visit | Attending: Cardiology | Admitting: Cardiology

## 2021-12-31 ENCOUNTER — Other Ambulatory Visit: Payer: Self-pay

## 2021-12-31 DIAGNOSIS — I5042 Chronic combined systolic (congestive) and diastolic (congestive) heart failure: Secondary | ICD-10-CM | POA: Diagnosis not present

## 2021-12-31 LAB — BASIC METABOLIC PANEL
Anion gap: 8 (ref 5–15)
BUN: 13 mg/dL (ref 8–23)
CO2: 22 mmol/L (ref 22–32)
Calcium: 8.7 mg/dL — ABNORMAL LOW (ref 8.9–10.3)
Chloride: 99 mmol/L (ref 98–111)
Creatinine, Ser: 1.28 mg/dL — ABNORMAL HIGH (ref 0.61–1.24)
GFR, Estimated: >60 mL/min (ref >60)
Glucose, Bld: 94 mg/dL (ref 70–99)
Potassium: 4.5 mmol/L (ref 3.5–5.1)
Sodium: 129 mmol/L — ABNORMAL LOW (ref 135–145)

## 2022-01-11 ENCOUNTER — Other Ambulatory Visit (HOSPITAL_COMMUNITY): Payer: Self-pay

## 2022-01-11 ENCOUNTER — Telehealth (HOSPITAL_COMMUNITY): Payer: Self-pay | Admitting: Pharmacy Technician

## 2022-01-11 NOTE — Telephone Encounter (Signed)
Advanced Heart Failure Patient Advocate Encounter  The patient was approved for a PAN HF grant that will help cover the cost of Jardiance.   Member ID: [id number] Group ID: [id number] RxBin ID: [id number] PCN: [id number] Eligibility Start Date: 10/13/2021 Eligibility End Date: 01/10/2023 Assistance Amount: $1,200.00  Will forego applying for BI Cares assistance at this time.

## 2022-01-16 ENCOUNTER — Ambulatory Visit: Payer: Medicare Other | Admitting: Nurse Practitioner

## 2022-01-16 ENCOUNTER — Ambulatory Visit: Payer: Medicare Other

## 2022-02-21 NOTE — Telephone Encounter (Signed)
Advanced Heart Failure Patient Advocate Encounter ? ?Called and left patient message regarding [employer]. Sent 90 day RX request to [facility] ([facility] to send to [facility] with the attached grant information. ? ?Charlann Boxer, CPhT ? ? ?

## 2022-02-22 ENCOUNTER — Other Ambulatory Visit (HOSPITAL_COMMUNITY): Payer: Self-pay | Admitting: *Deleted

## 2022-02-22 MED ORDER — EMPAGLIFLOZIN 10 MG PO TABS: 10.0000 mg | ORAL_TABLET | Freq: Every day | ORAL | 3 refills | Status: DC

## 2022-02-27 ENCOUNTER — Other Ambulatory Visit: Payer: Self-pay

## 2022-02-27 ENCOUNTER — Ambulatory Visit (INDEPENDENT_AMBULATORY_CARE_PROVIDER_SITE_OTHER): Payer: Medicare Other | Admitting: Nurse Practitioner

## 2022-02-27 ENCOUNTER — Encounter: Payer: Self-pay | Admitting: Nurse Practitioner

## 2022-02-27 ENCOUNTER — Ambulatory Visit (INDEPENDENT_AMBULATORY_CARE_PROVIDER_SITE_OTHER): Payer: Medicare Other

## 2022-02-27 VITALS — BP 122/70 | HR 68 | Temp 97.6°F | Ht 69.0 in | Wt 151.6 lb

## 2022-02-27 VITALS — BP 122/70 | HR 68 | Temp 98.7°F | Ht 69.0 in | Wt 151.0 lb

## 2022-02-27 DIAGNOSIS — D509 Iron deficiency anemia, unspecified: Secondary | ICD-10-CM

## 2022-02-27 DIAGNOSIS — Z8546 Personal history of malignant neoplasm of prostate: Secondary | ICD-10-CM

## 2022-02-27 DIAGNOSIS — I5042 Chronic combined systolic (congestive) and diastolic (congestive) heart failure: Secondary | ICD-10-CM | POA: Diagnosis not present

## 2022-02-27 DIAGNOSIS — F1010 Alcohol abuse, uncomplicated: Secondary | ICD-10-CM

## 2022-02-27 DIAGNOSIS — Z0000 Encounter for general adult medical examination without abnormal findings: Secondary | ICD-10-CM

## 2022-02-27 DIAGNOSIS — Z23 Encounter for immunization: Secondary | ICD-10-CM

## 2022-02-27 DIAGNOSIS — I110 Hypertensive heart disease with heart failure: Secondary | ICD-10-CM | POA: Diagnosis not present

## 2022-02-27 MED ORDER — SACUBITRIL-VALSARTAN 97-103 MG PO TABS: 1.0000 | ORAL_TABLET | Freq: Two times a day (BID) | ORAL | 3 refills | Status: DC

## 2022-02-27 NOTE — Patient Instructions (Signed)

## 2022-02-27 NOTE — Progress Notes (Addendum)
?Industrial/product designer as a Education administrator for [employer], FNP.,have documented all relevant documentation on the behalf of [name], FNP,as directed by  [name], FNP while in the presence of [name], [place]. ? ?This visit occurred during the SARS-CoV-2 public health emergency.  Safety protocols were in place, including screening questions prior to the visit, additional usage of staff PPE, and extensive cleaning of exam room while observing appropriate contact time as indicated for disinfecting solutions. ? ?Subjective:  ?  ? Patient ID: Austin [name] , male    DOB: 1955-12-17 , 66 y.o.   MRN: [id number] ? ? ?Chief Complaint  ?Patient presents with  ? Hypertension  ? ? ?HPI ? ?Patient here for a blood pressure check.  Continues to be followed by the Cardiologist, no changes.  AWV done by [facility]  ? ?Hypertension ?This is a chronic problem. The current episode started more than 1 year ago. The problem is unchanged. The problem is controlled. Pertinent negatives include no anxiety, blurred vision, chest pain, headaches, peripheral edema or shortness of breath. There are no associated agents to hypertension. Risk factors for coronary artery disease include sedentary lifestyle. Past treatments include diuretics and angiotensin blockers. There are no compliance problems.  There is no history of angina, kidney disease or CAD/MI. There is no history of coarctation of the aorta.   ? ?Past Medical History:  ?Diagnosis Date  ? Arthritis   ? hands  ? CHF (congestive heart failure) ([place])   ? GERD (gastroesophageal reflux disease)   ? Hypertension   ? Hypomagnesemia   ? Hyponatremia   ? Prostate cancer ([place])   ?  ? ?Family History  ?Problem Relation Age of Onset  ? Hypertension Mother   ? Hypertension Father   ? ? ? ?Current Outpatient Medications:  ?  carvedilol (COREG) 25 MG tablet, Take 1 tablet (25 mg total) by mouth 2 (two) times daily with a meal., Disp: 180 tablet, Rfl: 3 ?  empagliflozin (JARDIANCE) 10 MG TABS  tablet, Take 1 tablet (10 mg total) by mouth daily before breakfast., Disp: 90 tablet, Rfl: 3 ?  ferrous sulfate 325 (65 FE) MG tablet, TAKE 1 TABLET BY MOUTH EVERY DAY, Disp: 90 tablet, Rfl: 1 ?  KLOR-CON M20 20 MEQ tablet, Take 20 mEq by mouth daily., Disp: , Rfl:  ?  Magnesium Oxide 400 MG CAPS, Take 1 capsule (400 mg total) by mouth 2 (two) times daily., Disp: 180 capsule, Rfl: 3 ?  Multiple Vitamin (MULTIVITAMIN) tablet, Take 1 tablet by mouth daily., Disp: , Rfl:  ?  sacubitril-valsartan (ENTRESTO) 97-103 MG, TAKE 1 TABLET BY MOUTH 2 (TWO) TIMES DAILY., Disp: 180 tablet, Rfl: 3 ?  spironolactone (ALDACTONE) 25 MG tablet, TAKE 1 TABLET BY MOUTH EVERY DAY, Disp: 90 tablet, Rfl: 1  ? ?Allergies  ?Allergen Reactions  ? Latex Swelling and Rash  ?  ? ?Review of Systems  ?Constitutional: Negative.   ?Eyes:  Negative for blurred vision.  ?Respiratory: Negative.  Negative for shortness of breath and wheezing.   ?Cardiovascular: Negative.  Negative for chest pain.  ?Gastrointestinal: Negative.   ?Neurological: Negative.  Negative for headaches.  ?Psychiatric/Behavioral: Negative.     ? ?Today's Vitals  ? 02/27/22 0905  ?BP: 122/70  ?Pulse: 68  ?Temp: 98.7 ?F (37.1 ?C)  ?TempSrc: Oral  ?Weight: 151 lb (68.5 kg)  ?Height: 5' 9" (1.753 m)  ? ?Body mass index is 22.3 kg/m?.  ?Wt Readings from Last 3 Encounters:  ?02/27/22 151 lb (68.5  kg)  ?02/27/22 151 lb 9.6 oz (68.8 kg)  ?09/11/21 156 lb 6.4 oz (70.9 kg)  ? ? ?Objective:  ?Physical Exam ?Vitals reviewed.  ?Constitutional:   ?   General: He is not in acute distress. ?   Appearance: Normal appearance.  ?Cardiovascular:  ?   Rate and Rhythm: Normal rate and regular rhythm.  ?   Pulses: Normal pulses.  ?   Heart sounds: Normal heart sounds. No murmur heard. ?Pulmonary:  ?   Effort: Pulmonary effort is normal. No respiratory distress.  ?   Breath sounds: Normal breath sounds. No wheezing.  ?Musculoskeletal:     ?   General: No swelling or tenderness.  ?   Right lower leg: No  edema.  ?   Left lower leg: No edema.  ?Neurological:  ?   General: No focal deficit present.  ?   Mental Status: He is alert and oriented to person, place, and time.  ?   Cranial Nerves: No cranial nerve deficit.  ?   Motor: No weakness.  ?Psychiatric:     ?   Mood and Affect: Mood normal.     ?   Behavior: Behavior normal.     ?   Thought Content: Thought content normal.     ?   Judgment: Judgment normal.  ?  ? ?   ?Assessment And Plan:  ?   ?1. Hypertensive 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 (HCC) ?Comments: Blood pressure is much better controlled, continue f/u with Cardiology ?- BMP8+EGFR ?- sacubitril-valsartan (ENTRESTO) 97-103 MG; TAKE 1 TABLET BY MOUTH 2 (TWO) TIMES DAILY.  Dispense: 180 tablet; Refill: 3 ? ?2. Iron deficiency anemia, unspecified iron deficiency anemia type ?Comments: No longer taking iron supplements, will check Hgb ?- Iron, TIBC and Ferritin Panel ? ?3. ETOH abuse ?Comments: Continues to drink alcohol but has decreased significantly ? ?4. History of prostate cancer ?  ?When he returns in 4 months will give his Pneumonia and Tdap ? ?Patient was given opportunity to ask questions. Patient verbalized understanding of the plan and was able to repeat key elements of the plan. All questions were answered to their satisfaction.  ?Janece Moore, FNP  ? ?I, Janece Moore, FNP, have reviewed all documentation for this visit. The documentation on 02/27/22 for the exam, diagnosis, procedures, and orders are all accurate and complete.  ? ?IF YOU HAVE BEEN REFERRED TO A SPECIALIST, IT MAY TAKE 1-2 WEEKS TO SCHEDULE/PROCESS THE REFERRAL. IF YOU HAVE NOT HEARD FROM US/SPECIALIST IN TWO WEEKS, PLEASE GIVE US A CALL AT [phone] X [phone].  ? ?THE PATIENT IS ENCOURAGED TO PRACTICE SOCIAL DISTANCING DUE TO THE COVID-19 PANDEMIC.   ?

## 2022-02-27 NOTE — Progress Notes (Signed)
?This visit occurred during the SARS-CoV-2 public health emergency.  Safety protocols were in place, including screening questions prior to the visit, additional usage of staff PPE, and extensive cleaning of exam room while observing appropriate contact time as indicated for disinfecting solutions. ? ? ?Patient received shingrix today. It was billed through [facility] with his consent. ? ?Subjective:  ? Austin [name] is a 66 y.o. male who presents for Medicare Annual/Subsequent preventive examination. ? ?Review of Systems    ? ?Cardiac Risk Factors include: advanced age (>33mn, >>50women);hypertension ? ?   ?Objective:  ?  ?Today's Vitals  ? 02/27/22 0828  ?BP: 122/70  ?Pulse: 68  ?Temp: 97.6 ?F (36.4 ?C)  ?TempSrc: Oral  ?SpO2: 93%  ?Weight: 151 lb 9.6 oz (68.8 kg)  ?Height: '5\' 9"'$  (1.753 m)  ? ?Body mass index is 22.39 kg/m?. ? ? ?  02/27/2022  ?  8:41 AM 12/06/2020  ?  3:41 PM 03/26/2020  ?  3:00 PM 02/25/2020  ?  6:13 AM 04/05/2017  ? 12:24 PM 01/26/2014  ?  2:07 PM 01/26/2014  ?  6:41 AM  ?Advanced Directives  ?Does Patient Have a Medical Advance Directive? Yes Yes No No No Patient does not have advance directive;Patient would like information Patient has advance directive, copy not in chart  ?Type of AParamedicof AJer[place]Living will [place]ittredgeLiving will     [place] will  ?Does patient want to make changes to medical advance directive?  No - Patient declined       ?Copy of [place] Chart? No - copy requested No - copy requested    Copy requested from family Copy requested from family  ?Would patient like information on creating a medical advance directive?   No - Patient declined No - Patient declined No - Patient declined    ? ? ?Current Medications (verified) ?Outpatient Encounter Medications as of 02/27/2022  ?Medication Sig  ? carvedilol (COREG) 25 MG tablet Take 1 tablet (25 mg total) by mouth 2 (two) times daily  with a meal.  ? empagliflozin (JARDIANCE) 10 MG TABS tablet Take 1 tablet (10 mg total) by mouth daily before breakfast.  ? ferrous sulfate 325 (65 FE) MG tablet TAKE 1 TABLET BY MOUTH EVERY DAY  ? KLOR-CON M20 20 MEQ tablet Take 20 mEq by mouth daily.  ? Magnesium Oxide 400 MG CAPS Take 1 capsule (400 mg total) by mouth 2 (two) times daily.  ? Multiple Vitamin (MULTIVITAMIN) tablet Take 1 tablet by mouth daily.  ? spironolactone (ALDACTONE) 25 MG tablet TAKE 1 TABLET BY MOUTH EVERY DAY  ? ?No facility-administered encounter medications on file as of 02/27/2022.  ? ? ?Allergies (verified) ?Latex  ? ?History: ?Past Medical History:  ?Diagnosis Date  ? Arthritis   ? hands  ? [place]) [place]   ? GERD (gastroesophageal reflux disease)   ? Hypertension   ? Hypomagnesemia   ? Hyponatremia   ? Prostate cancer ([place]   ? ?Past Surgical History:  ?Procedure Laterality Date  ? LYMPHADENECTOMY Bilateral 01/26/2014  ? Procedure: LYMPHADENECTOMY WITH INDOCYANINE GREEN DYE;  Surgeon: TAlexis Frock MD;  Location: [facility];  Service: Urology;  Laterality: Bilateral;  ? RIGHT/LEFT HEART CATH AND CORONARY ANGIOGRAPHY N/A 02/25/2020  ? Procedure: RIGHT/LEFT HEART CATH AND CORONARY ANGIOGRAPHY;  Surgeon: MBurnell Blanks MD;  [facility];  Service: Cardiovascular;  Laterality: N/A;  ? ROBOT  ASSISTED LAPAROSCOPIC RADICAL PROSTATECTOMY N/A 01/26/2014  ? Procedure: ROBOTIC ASSISTED LAPAROSCOPIC RADICAL PROSTATECTOMY;  Surgeon: Alexis Frock, MD;  Location: [facility];  Service: Urology;  Laterality: N/A;  ? SHOULDER SURGERY Left   ? ?Family History  ?Problem Relation Age of Onset  ? Hypertension Mother   ? Hypertension Father   ? ?Social History  ? ?Socioeconomic History  ? Marital status: Married  ?  Spouse name: Not on file  ? Number of children: 2  ? Years of education: Not on file  ? Highest education level: Bachelor's degree (e.g., BA, AB, BS)  ?Occupational History  ? Not on file  ?Tobacco Use   ? Smoking status: Never  ? Smokeless tobacco: Never  ?Vaping Use  ? Vaping Use: Never used  ?Substance and Sexual Activity  ? Alcohol use: Yes  ?  Comment: 1-3 beers daily  ? Drug use: No  ? Sexual activity: Yes  ?Other Topics Concern  ? Not on file  ?Social History Narrative  ? Not on file  ? ?Social Determinants of Health  ? ?Financial Resource Strain: Low Risk   ? Difficulty of Paying Living Expenses: Not hard at all  ?Food Insecurity: No Food Insecurity  ? Worried About Charity fundraiser in the Last Year: Never true  ? Ran Out of Food in the Last Year: Never true  ?Transportation Needs: No Transportation Needs  ? Lack of Transportation (Medical): No  ? Lack of Transportation (Non-Medical): No  ?Physical Activity: Inactive  ? Days of Exercise per Week: 0 days  ? Minutes of Exercise per Session: 0 min  ?Stress: No Stress Concern Present  ? Feeling of Stress : Only a little  ?Social Connections: Not on file  ? ? ?Tobacco Counseling ?Counseling given: Not Answered ? ? ?Clinical Intake: ? ?Pre-visit preparation completed: Yes ? ?Pain : No/denies pain ? ?  ? ?Nutritional Risks: None ?Diabetes: No ? ?How often do you need to have someone help you when you read instructions, pamphlets, or other written materials from your doctor or pharmacy?: 1 - Never ?What is the last grade level you completed in school?: college ? ?Diabetic?no ? ?Interpreter Needed?: No ? ?Information entered by :: NAllen LPN ? ? ?Activities of Daily Living ? ?  02/27/2022  ?  8:42 AM  ?In your present state of health, do you have any difficulty performing the following activities:  ?Hearing? 0  ?Vision? 0  ?Difficulty concentrating or making decisions? 0  ?Walking or climbing stairs? 0  ?Dressing or bathing? 0  ?Doing errands, shopping? 1  ?Comment does not drive  ?Preparing Food and eating ? N  ?Using the Toilet? N  ?In the past six months, have you accidently leaked urine? Y  ?Do you have problems with loss of bowel control? N  ?Managing your  Medications? N  ?Managing your Finances? N  ?Housekeeping or managing your Housekeeping? N  ? ? ?Patient Care Team: ?[name], FNP as PCP - General (General Practice) ?Donato Heinz, MD as PCP - Cardiology (Cardiology) ? ?Indicate any recent Medical Services you may have received from other than [facility] providers in the past year (date may be approximate). ? ?   ?Assessment:  ? This is a routine wellness examination for Joann. ? ?Hearing/Vision screen ?Vision Screening - Comments:: No regular eye exams ? ?Dietary issues and exercise activities discussed: ?Current Exercise Habits: The patient has a physically strenuous job, but has no regular exercise apart from work. ? ? Goals  Addressed   ? ?  ?  ?  ?  ? This Visit's Progress  ?  Patient Stated     ?  02/27/2022, no goals ?  ? ?  ? ?Depression Screen ? ?  02/27/2022  ?  8:42 AM 12/06/2020  ?  3:17 PM 12/06/2020  ?  3:12 PM 09/12/2020  ? 10:40 AM 07/19/2019  ?  2:52 PM 09/25/2018  ?  2:40 PM  ?PHQ 2/9 Scores  ?PHQ - 2 Score 0 0 0 0 0 0  ?  ?Fall Risk ? ?  02/27/2022  ?  8:41 AM 12/06/2020  ?  3:38 PM 12/06/2020  ?  3:12 PM 07/19/2019  ?  2:52 PM 09/25/2018  ?  2:40 PM  ?Fall Risk   ?Falls in the past year? 0 0 0 0 No  ?Number falls in past yr:  0 0    ?Injury with Fall?  0 0    ?Risk for fall due to : Medication side effect No Fall Risks     ?Follow up Falls evaluation completed;Education provided;Falls prevention discussed Falls evaluation completed;Falls prevention discussed     ? ? ?FALL RISK PREVENTION PERTAINING TO THE HOME: ? ?Any stairs in or around the home? No  ?If so, are there any without handrails?  N/a ?Home free of loose throw rugs in walkways, pet beds, electrical cords, etc? Yes  ?Adequate lighting in your home to reduce risk of falls? Yes  ? ?ASSISTIVE DEVICES UTILIZED TO PREVENT FALLS: ? ?Life alert? No  ?Use of a cane, walker or w/c? No  ?Grab bars in the bathroom? Yes  ?Shower chair or bench in shower? No  ?Elevated toilet seat or a  handicapped toilet? No  ? ?TIMED UP AND GO: ? ?Was the test performed? No .  ? ? ?Gait steady and fast without use of assistive device ? ?Cognitive Function: ? ?  12/06/2020  ?  3:14 PM  ?MMSE - Mini Mental S

## 2022-02-27 NOTE — Patient Instructions (Addendum)
Mr. Batzel , ?Thank you for taking time to come for your Medicare Wellness Visit. I appreciate your ongoing commitment to your health goals. Please review the following plan we discussed and let me know if I can assist you in the future.  ? ?Screening recommendations/referrals: ?Colonoscopy: completed 02/09/2020, due 02/08/2025 ?Recommended yearly ophthalmology/optometry visit for glaucoma screening and checkup ?Recommended yearly dental visit for hygiene and checkup ? ?Vaccinations: ?Influenza vaccine: completed 10/15/2021, due next flu season ?Pneumococcal vaccine: due ?Tdap vaccine: due ?Shingles vaccine: first dose today   ?Covid-19:  09/22/2020, 03/21/2020, 02/24/2020 ? ?Advanced directives: Please bring a copy of your POA (Power of Attorney) and/or Living Will to your next appointment.  ? ?Conditions/risks identified: none ? ?Next appointment: Follow up in one year for your annual wellness visit.  ? ?Preventive Care 67 Years and Older, Male ?Preventive care refers to lifestyle choices and visits with your health care provider that can promote health and wellness. ?What does preventive care include? ?A yearly physical exam. This is also called an annual well check. ?Dental exams once or twice a year. ?Routine eye exams. Ask your health care provider how often you should have your eyes checked. ?Personal lifestyle choices, including: ?Daily care of your teeth and gums. ?Regular physical activity. ?Eating a healthy diet. ?Avoiding tobacco and drug use. ?Limiting alcohol use. ?Practicing safe sex. ?Taking low doses of aspirin every day. ?Taking vitamin and mineral supplements as recommended by your health care provider. ?What happens during an annual well check? ?The services and screenings done by your health care provider during your annual well check will depend on your age, overall health, lifestyle risk factors, and family history of disease. ?Counseling  ?Your health care provider may ask you questions about  your: ?Alcohol use. ?Tobacco use. ?Drug use. ?Emotional well-being. ?Home and relationship well-being. ?Sexual activity. ?Eating habits. ?History of falls. ?Memory and ability to understand (cognition). ?Work and work Statistician. ?Screening  ?You may have the following tests or measurements: ?Height, weight, and BMI. ?Blood pressure. ?Lipid and cholesterol levels. These may be checked every 5 years, or more frequently if you are over 69 years old. ?Skin check. ?Lung cancer screening. You may have this screening every year starting at age 37 if you have a 30-pack-year history of smoking and currently smoke or have quit within the past 15 years. ?Fecal occult blood test (FOBT) of the stool. You may have this test every year starting at age 39. ?Flexible sigmoidoscopy or colonoscopy. You may have a sigmoidoscopy every 5 years or a colonoscopy every 10 years starting at age 66. ?Prostate cancer screening. Recommendations will vary depending on your family history and other risks. ?Hepatitis C blood test. ?Hepatitis B blood test. ?Sexually transmitted disease (STD) testing. ?Diabetes screening. This is done by checking your blood sugar (glucose) after you have not eaten for a while (fasting). You may have this done every 1-3 years. ?Abdominal aortic aneurysm (AAA) screening. You may need this if you are a current or former smoker. ?Osteoporosis. You may be screened starting at age 53 if you are at high risk. ?Talk with your health care provider about your test results, treatment options, and if necessary, the need for more tests. ?Vaccines  ?Your health care provider may recommend certain vaccines, such as: ?Influenza vaccine. This is recommended every year. ?Tetanus, diphtheria, and acellular pertussis (Tdap, Td) vaccine. You may need a Td booster every 10 years. ?Zoster vaccine. You may need this after age 9. ?Pneumococcal 13-valent conjugate (PCV13) vaccine.  One dose is recommended after age 44. ?Pneumococcal  polysaccharide (PPSV23) vaccine. One dose is recommended after age 31. ?Talk to your health care provider about which screenings and vaccines you need and how often you need them. ?This information is not intended to replace advice given to you by your health care provider. Make sure you discuss any questions you have with your health care provider. ?Document Released: 12/22/2015 Document Revised: 08/14/2016 Document Reviewed: 09/26/2015 ?Elsevier Interactive Patient Education ? 2017 [place]. ? ?Fall Prevention in the Home ?Falls can cause injuries. They can happen to people of all ages. There are many things you can do to make your home safe and to help prevent falls. ?What can I do on the outside of my home? ?Regularly fix the edges of walkways and driveways and fix any cracks. ?Remove anything that might make you trip as you walk through a door, such as a raised step or threshold. ?Trim any bushes or trees on the path to your home. ?Use bright outdoor lighting. ?Clear any walking paths of anything that might make someone trip, such as rocks or tools. ?Regularly check to see if handrails are loose or broken. Make sure that both sides of any steps have handrails. ?Any raised decks and porches should have guardrails on the edges. ?Have any leaves, snow, or ice cleared regularly. ?Use sand or salt on walking paths during winter. ?Clean up any spills in your garage right away. This includes oil or grease spills. ?What can I do in the bathroom? ?Use night lights. ?Install grab bars by the toilet and in the tub and shower. Do not use towel bars as grab bars. ?Use non-skid mats or decals in the tub or shower. ?If you need to sit down in the shower, use a plastic, non-slip stool. ?Keep the floor dry. Clean up any water that spills on the floor as soon as it happens. ?Remove soap buildup in the tub or shower regularly. ?Attach bath mats securely with double-sided non-slip rug tape. ?Do not have throw rugs and other  things on the floor that can make you trip. ?What can I do in the bedroom? ?Use night lights. ?Make sure that you have a light by your bed that is easy to reach. ?Do not use any sheets or blankets that are too big for your bed. They should not hang down onto the floor. ?Have a firm chair that has side arms. You can use this for support while you get dressed. ?Do not have throw rugs and other things on the floor that can make you trip. ?What can I do in the kitchen? ?Clean up any spills right away. ?Avoid walking on wet floors. ?Keep items that you use a lot in easy-to-reach places. ?If you need to reach something above you, use a strong step stool that has a grab bar. ?Keep electrical cords out of the way. ?Do not use floor polish or wax that makes floors slippery. If you must use wax, use non-skid floor wax. ?Do not have throw rugs and other things on the floor that can make you trip. ?What can I do with my stairs? ?Do not leave any items on the stairs. ?Make sure that there are handrails on both sides of the stairs and use them. Fix handrails that are broken or loose. Make sure that handrails are as long as the stairways. ?Check any carpeting to make sure that it is firmly attached to the stairs. Fix any carpet that is loose or  worn. ?Avoid having throw rugs at the top or bottom of the stairs. If you do have throw rugs, attach them to the floor with carpet tape. ?Make sure that you have a light switch at the top of the stairs and the bottom of the stairs. If you do not have them, ask someone to add them for you. ?What else can I do to help prevent falls? ?Wear shoes that: ?Do not have high heels. ?Have rubber bottoms. ?Are comfortable and fit you well. ?Are closed at the toe. Do not wear sandals. ?If you use a stepladder: ?Make sure that it is fully opened. Do not climb a closed stepladder. ?Make sure that both sides of the stepladder are locked into place. ?Ask someone to hold it for you, if possible. ?Clearly  mark and make sure that you can see: ?Any grab bars or handrails. ?First and last steps. ?Where the edge of each step is. ?Use tools that help you move around (mobility aids) if they are needed. These include: ?Canes

## 2022-02-28 LAB — BMP8+EGFR
BUN/Creatinine Ratio: 11 (ref 10–24)
BUN: 14 mg/dL (ref 8–27)
CO2: 20 mmol/L (ref 20–29)
Calcium: 9.3 mg/dL (ref 8.6–10.2)
Chloride: 96 mmol/L (ref 96–106)
Creatinine, Ser: 1.29 mg/dL — ABNORMAL HIGH (ref 0.76–1.27)
Glucose: 80 mg/dL (ref 70–99)
Potassium: 4.6 mmol/L (ref 3.5–5.2)
Sodium: 133 mmol/L — ABNORMAL LOW (ref 134–144)
eGFR: 61 mL/min/{1.73_m2} (ref >59)

## 2022-02-28 LAB — IRON,TIBC AND FERRITIN PANEL
Ferritin: 147 ng/mL (ref 30–400)
Iron Saturation: 37 % (ref 15–55)
Iron: 116 ug/dL (ref 38–169)
Total Iron Binding Capacity: 313 ug/dL (ref 250–450)
UIBC: 197 ug/dL (ref 111–343)

## 2022-03-07 ENCOUNTER — Telehealth (HOSPITAL_COMMUNITY): Payer: Self-pay | Admitting: Licensed Clinical Social Worker

## 2022-03-07 NOTE — Telephone Encounter (Signed)
CSW received call from pt wife requesting help with jardiance and entresto copays.  CSW reviewed chart and saw that patient advocate had gotten pt a HF fun grant which is good until 2024.  CSW informed wife of this and provided with grant information so she could provide to pharmacy. ? ?Encouraged wife to call back if pharmacy has problems running the grant. ? ?Will continue to follow and assist as needed ? ?Jorge Ny, LCSW ?Clinical Social Worker ?[facility] ?Desk#: [phone] ?Cell#: [phone] ? ?

## 2022-03-14 ENCOUNTER — Encounter (HOSPITAL_COMMUNITY): Payer: Self-pay | Admitting: Cardiology

## 2022-03-14 ENCOUNTER — Ambulatory Visit (HOSPITAL_COMMUNITY)
Admission: RE | Admit: 2022-03-14 | Discharge: 2022-03-14 | Disposition: A | Discharge status: Home / Self Care | Payer: Medicare Other | Source: Ambulatory Visit | Attending: Cardiology | Admitting: Cardiology

## 2022-03-14 VITALS — BP 118/70 | HR 80 | Wt 149.6 lb

## 2022-03-14 DIAGNOSIS — F1010 Alcohol abuse, uncomplicated: Secondary | ICD-10-CM | POA: Insufficient documentation

## 2022-03-14 DIAGNOSIS — I5022 Chronic systolic (congestive) heart failure: Secondary | ICD-10-CM | POA: Diagnosis present

## 2022-03-14 DIAGNOSIS — I428 Other cardiomyopathies: Secondary | ICD-10-CM | POA: Diagnosis not present

## 2022-03-14 DIAGNOSIS — Z7984 Long term (current) use of oral hypoglycemic drugs: Secondary | ICD-10-CM | POA: Diagnosis not present

## 2022-03-14 DIAGNOSIS — I110 Hypertensive heart disease with heart failure: Secondary | ICD-10-CM | POA: Insufficient documentation

## 2022-03-14 DIAGNOSIS — Z79899 Other long term (current) drug therapy: Secondary | ICD-10-CM | POA: Diagnosis not present

## 2022-03-14 DIAGNOSIS — I5042 Chronic combined systolic (congestive) and diastolic (congestive) heart failure: Secondary | ICD-10-CM

## 2022-03-14 LAB — LIPID PANEL
Cholesterol: 241 mg/dL — ABNORMAL HIGH (ref 0–200)
HDL: >135 mg/dL (ref >40)
Triglycerides: 37 mg/dL (ref <150)
VLDL: 7 mg/dL (ref 0–40)

## 2022-03-14 LAB — BASIC METABOLIC PANEL
Anion gap: 11 (ref 5–15)
BUN: 11 mg/dL (ref 8–23)
CO2: 23 mmol/L (ref 22–32)
Calcium: 8.9 mg/dL (ref 8.9–10.3)
Chloride: 100 mmol/L (ref 98–111)
Creatinine, Ser: 1.09 mg/dL (ref 0.61–1.24)
GFR, Estimated: >60 mL/min (ref >60)
Glucose, Bld: 96 mg/dL (ref 70–99)
Potassium: 3.6 mmol/L (ref 3.5–5.1)
Sodium: 134 mmol/L — ABNORMAL LOW (ref 135–145)

## 2022-03-14 NOTE — Patient Instructions (Signed)
There has been no changes to your medications. ? ?Labs done today, your results will be available in MyChart, we will contact you for abnormal readings. ? ?Your physician has requested that you have an echocardiogram. Echocardiography is a painless test that uses sound waves to create images of your heart. It provides your doctor with information about the size and shape of your heart and how well your heart?s chambers and valves are working. This procedure takes approximately one hour. There are no restrictions for this procedure. ? ?Your physician recommends that you schedule a follow-up appointment in: 6 months ( October 2023)  **please call the office in August to arrange your follow up appointment ** ? ?If you have any questions or concerns before your next appointment please send [place] a message through [place] or call our office at [phone].   ? ?TO LEAVE A MESSAGE FOR THE NURSE SELECT OPTION 2, PLEASE LEAVE A MESSAGE INCLUDING: ?YOUR NAME ?DATE OF BIRTH ?CALL BACK NUMBER ?REASON FOR CALL**this is important as we prioritize the call backs ? ?YOU WILL RECEIVE A CALL BACK THE SAME DAY AS LONG AS YOU CALL BEFORE 4:00 PM ? ?At the [facility], you and your health needs are our priority. As part of our continuing mission to provide you with exceptional heart care, we have created designated Provider Care Teams. These Care Teams include your primary Cardiologist (physician) and Advanced Practice Providers (APPs- Physician Assistants and Nurse Practitioners) who all work together to provide you with the care you need, when you need it.  ? ?You may see any of the following providers on your designated Care Team at your next follow up: ?Dr Glori Bickers ?Dr [name] ?Darrick Grinder, NP ?Lyda Jester, PA ?Jessica Milford,NP ?Marlyce Huge, PA ?Audry Riles, PharmD ? ? ?Please be sure to bring in all your medications bottles to every appointment.  ? ? ?

## 2022-03-14 NOTE — Progress Notes (Signed)
PCP: [name], FNP ?Cardiology: Dr. Gardiner Rhyme ?HF Cardiology: Dr. Aundra Dubin ? ?66 y.o. with history of ETOH abuse and prior HTN returns for followup of nonischemic cardiomyopathy. Patient had no known prior cardiac history.  In 2/21, he developed exertional dyspnea and was noted at an urgent care center to have CHF.  Echo in 3/21 showed EF < 20% with severe RV dysfunction.  RHC/LHC in 3/21 showed no CAD but R > L filling pressure elevation and preserved cardiac index.  He was admitted in 4/21 with acute/chronic systolic CHF.  He was diuresed and cardiac meds were adjusted.  Cardiac MRI this admission showed EF 14%, moderate LV dilation, severely decreased RV function, and moderate pericardial effusion. There was no evidence for myocarditis noted.  ? ?Patient, of note, has a history of heavy ETOH intake for decades. Per his wife, he would drink heavily every day after getting home from work and especially heavily on the weekend. He has a history of ETOH withdrawal.  He does have some family history of CHF (sister, maybe his mother), but he does not have a lot of detail about these diagnoses. No drugs (cocaine, amphetamines).  ? ?Echo in 8/21 showed EF 30-35%, diffuse hypokinesis, normal RV, no pericardial effusion. Echo in 10/21 showed EF 45-50% with global hypokinesis.  Echo in 3/22 with EF 45-50%, normal RV, PASP 34 mmHg.  ? ?Patient continues to do well.  He is drinking only rarely.  No significant exertional dyspnea or chest pain.  Main activity is doing yardwork.  No lightheadedness.     ? ?ECG (personally reviewed):NSR, LVH, LAFB ? ?Labs (4/21): HIV negative, K 4.2, creatinine 0.93 ?Labs (5/21): K 4.2, creatinine 1.23, digoxin 0.6 ?Labs (7/21): K 3.8, creatinine 1.49 ?Labs (10/21): K 4.2, creatinine 1.25, BNP 14 ?Labs (12/21): K 4.3, creatinine 1.14 ?Labs (3/22): K 4.4, creatinine 1.34 ?Labs (7/22): K 3.9, creatinine 1.19 ?Labs (3/23): K 4.6, creatinine 1.29 ? ?PMH: ?1. Prostate cancer ?2. HTN: BP now low.   ?3. Fe deficiency anemia ?4. ETOH abuse ?5. GERD ?6. Chronic systolic CHF: Nonischemic cardiomyopathy.  ?- Echo 3/21 with EF < 20%, severe global HK, severely dilated and severely dysfunctional RV, severe biatrial enlargement.  ?- RHC/LHC (3/21): No CAD; mean RA 15, PA 51/24, mean PCWP 19, CI 3.96.  ?- HIV negative 4/21 ?- Cardiac MRI (4/21): LV EF 14%, moderate LV dilation, mild RV dilation with severely decreased systolic function, moderate pericardial effusion, no LGE noted but images difficult due to recent Fe infusion.  ?- Echo (8/21): EF 30-35%, diffuse hypokinesis, normal RV, no pericardial fluid.  ?- Echo (10/21): EF 45-50%, diffuse hypokinesis, normal RV.  ?- Echo (3/22): EF 45-50%, normal RV, PASP 34 mmHg.  ? ?FH: Sister with CHF, brother with pacemaker, mother with CHF.  ? ?SH: Married, lives in [place], heavy ETOH in the past has now cut back, no drugs.  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  ? ?ROS: All systems reviewed and negative except as per HPI.  ? ?Current Outpatient Medications  ?Medication Sig Dispense Refill  ? carvedilol (COREG) 25 MG tablet Take 1 tablet (25 mg total) by mouth 2 (two) times daily with a meal. 180 tablet 3  ? empagliflozin (JARDIANCE) 10 MG TABS tablet Take 1 tablet (10 mg total) by mouth daily before breakfast. 90 tablet 3  ? ferrous sulfate 325 (65 FE) MG tablet TAKE 1 TABLET BY MOUTH EVERY DAY 90 tablet 1  ? KLOR-CON M20 20 MEQ tablet Take 20 mEq by mouth daily.    ?  Magnesium Oxide 400 MG CAPS Take 1 capsule (400 mg total) by mouth 2 (two) times daily. 180 capsule 3  ? Multiple Vitamin (MULTIVITAMIN) tablet Take 1 tablet by mouth daily.    ? sacubitril-valsartan (ENTRESTO) 97-103 MG TAKE 1 TABLET BY MOUTH 2 (TWO) TIMES DAILY. 180 tablet 3  ? spironolactone (ALDACTONE) 25 MG tablet TAKE 1 TABLET BY MOUTH EVERY DAY 90 tablet 1  ? ?No current facility-administered medications for this encounter.  ? ?BP 118/70   Pulse 80   Wt 67.9 kg (149 lb 9.6 oz)   SpO2 99%   BMI 22.09 kg/m?   ?General: NAD ?Neck: No JVD, no thyromegaly or thyroid nodule.  ?Lungs: Clear to auscultation bilaterally with normal respiratory effort. ?CV: Nondisplaced PMI.  Heart regular S1/S2, no S3/S4, no murmur.  No peripheral edema.  No carotid bruit.  Normal pedal pulses.  ?Abdomen: Soft, nontender, no hepatosplenomegaly, no distention.  ?Skin: Intact without lesions or rashes.  ?Neurologic: Alert and oriented x 3.  ?Psych: Normal affect. ?Extremities: No clubbing or cyanosis.  ?HEENT: Normal.  ? ?Assessment/Plan: ?1. Chronic systolic CHF: Nonischemic cardiomyopathy.  Echo from 3/21 showed EF 20% with moderately decreased RV systolic function by my read.  RHC/LHC from 3/21 showed no significant coronary disease, preserved cardiac output, R>L heart failure.  Cardiac MRI in 4/21 showed EF 14%, severe RV dysfunction, no LGE.  HIV negative.  Patient has an extensive history of heavy ETOH intake; it is very possible that this is ETOH cardiomyopathy.  No LGE noted on cardiac MRI but difficult LGE images, cannot rule out prior myocarditis.  Family history is not fully defined.  He has not been noted to have frequent PVCs.  Echo in 10/21 showed EF improved to 45-50%, echo in 4/22 showed stable EF 45-50%.  NYHA class II.  He is not volume overloaded.  He has cut back a lot on ETOH, only 1-2 beers/week now.  EF now out of ICD range.  ?- Continue empagliflozin 10 mg daily.    ?- Continue spironolactone 25 daily. BMET today.  ?- Continue Entresto 97/103 bid.  ?- No need for a loop diuretic.  ?- Continue Coreg 25 mg bid.   ?- Need repeat echo to reassess LV systolic function.  ?2. ETOH abuse: Long history of heavy ETOH.  He has cut back considerably.  This may be the cause of his cardiomyopathy.   ?- I strongly encouraged him to keep ETOH minimal.   ? ?Followup in 6 months.  ? ?[name] ?03/14/2022 ? ? ?

## 2022-03-19 ENCOUNTER — Other Ambulatory Visit (HOSPITAL_COMMUNITY): Payer: Self-pay | Admitting: Cardiology

## 2022-03-19 DIAGNOSIS — I5042 Chronic combined systolic (congestive) and diastolic (congestive) heart failure: Secondary | ICD-10-CM

## 2022-03-19 NOTE — Progress Notes (Signed)
Lipid profile ordered 

## 2022-03-27 ENCOUNTER — Ambulatory Visit (HOSPITAL_COMMUNITY)
Admission: RE | Admit: 2022-03-27 | Discharge: 2022-03-27 | Disposition: A | Discharge status: Home / Self Care | Payer: Medicare Other | Source: Ambulatory Visit | Attending: Cardiology | Admitting: Cardiology

## 2022-03-27 DIAGNOSIS — I5042 Chronic combined systolic (congestive) and diastolic (congestive) heart failure: Secondary | ICD-10-CM | POA: Insufficient documentation

## 2022-03-27 DIAGNOSIS — I110 Hypertensive heart disease with heart failure: Secondary | ICD-10-CM | POA: Diagnosis present

## 2022-03-27 LAB — LIPID PANEL
Cholesterol: 232 mg/dL — ABNORMAL HIGH (ref 0–200)
HDL: >135 mg/dL (ref >40)
Triglycerides: 41 mg/dL (ref <150)
VLDL: 8 mg/dL (ref 0–40)

## 2022-03-27 LAB — ECHOCARDIOGRAM COMPLETE
AR max vel: 3.09 cm2
AV Peak grad: 4.5 mmHg
Ao pk vel: 1.07 m/s
Area-P 1/2: 4.6 cm2
S' Lateral: 3.2 cm

## 2022-04-02 ENCOUNTER — Telehealth (HOSPITAL_COMMUNITY): Payer: Self-pay | Admitting: Cardiology

## 2022-04-02 DIAGNOSIS — I5042 Chronic combined systolic (congestive) and diastolic (congestive) heart failure: Secondary | ICD-10-CM

## 2022-04-02 NOTE — Telephone Encounter (Signed)
-----   Message from Larey Dresser, MD sent at 03/27/2022 11:43 AM EDT ----- ?Prominent mobile structure in RA.  May be Chiari network but thing we probably do need TEE to rule out thrombus etc.  Let him know that I think TEE is needed, see if he is willing to have this set up.  No meds to hold.  ?

## 2022-04-02 NOTE — Telephone Encounter (Addendum)
Patient called.  Patient aware via wife will arrange procedure and return call.  ? ? ?TEE arranged for 5/8 @ 730 arrival 530 (wife aware and letter mailed) ? ? ?You are scheduled for a TEE on 04/15/2022 with Dr. Aundra Dubin.  Please arrive at the [facility] (Main Entrance A) at [facility]: [address] [place], [place] [address] at 0600 am. 2 ?DIET: Nothing to eat or drink after midnight except a sip of water with medications (see medication instructions below) ? ?Medication Instructions: ?Hold Spironolactone ? ?Continue your anticoagulant: n/a ?You will need to continue your anticoagulant after your procedure until you  are told by your  ?Provider that it is safe to stop ? ? ? ? ?Come to: pre procedure labs to be drawn 04/12/2022 at  1100 am the [facility] ? ?You must have a responsible person to drive you home and stay in the waiting area during your procedure. Failure to do so could result in cancellation. ? ?Interior and spatial designer cards. ? ?*Special Note: Every effort is made to have your procedure done on time. Occasionally there are emergencies that occur at the hospital that may cause delays. Please be patient if a delay does occur.  ? ?

## 2022-04-09 ENCOUNTER — Encounter (HOSPITAL_COMMUNITY): Payer: Self-pay | Admitting: Cardiology

## 2022-04-12 ENCOUNTER — Ambulatory Visit (HOSPITAL_COMMUNITY)
Admission: RE | Admit: 2022-04-12 | Discharge: 2022-04-12 | Disposition: A | Discharge status: Home / Self Care | Payer: Medicare Other | Source: Ambulatory Visit | Attending: Cardiology | Admitting: Cardiology

## 2022-04-12 ENCOUNTER — Telehealth (HOSPITAL_COMMUNITY): Payer: Self-pay

## 2022-04-12 ENCOUNTER — Other Ambulatory Visit (HOSPITAL_COMMUNITY): Payer: Self-pay | Admitting: *Deleted

## 2022-04-12 DIAGNOSIS — I5042 Chronic combined systolic (congestive) and diastolic (congestive) heart failure: Secondary | ICD-10-CM | POA: Insufficient documentation

## 2022-04-12 DIAGNOSIS — Q209 Congenital malformation of cardiac chambers and connections, unspecified: Secondary | ICD-10-CM

## 2022-04-12 DIAGNOSIS — R931 Abnormal findings on diagnostic imaging of heart and coronary circulation: Secondary | ICD-10-CM

## 2022-04-12 LAB — BASIC METABOLIC PANEL
Anion gap: 11 (ref 5–15)
BUN: 10 mg/dL (ref 8–23)
CO2: 25 mmol/L (ref 22–32)
Calcium: 9 mg/dL (ref 8.9–10.3)
Chloride: 95 mmol/L — ABNORMAL LOW (ref 98–111)
Creatinine, Ser: 1.18 mg/dL (ref 0.61–1.24)
GFR, Estimated: >60 mL/min (ref >60)
Glucose, Bld: 108 mg/dL — ABNORMAL HIGH (ref 70–99)
Potassium: 3.6 mmol/L (ref 3.5–5.1)
Sodium: 131 mmol/L — ABNORMAL LOW (ref 135–145)

## 2022-04-12 LAB — CBC
HCT: 37.6 % — ABNORMAL LOW (ref 39.0–52.0)
Hemoglobin: 13 g/dL (ref 13.0–17.0)
MCH: 33.2 pg (ref 26.0–34.0)
MCHC: 34.6 g/dL (ref 30.0–36.0)
MCV: 96.2 fL (ref 80.0–100.0)
Platelets: UNDETERMINED 10*3/uL (ref 150–400)
RBC: 3.91 MIL/uL — ABNORMAL LOW (ref 4.22–5.81)
RDW: 14.6 % (ref 11.5–15.5)
WBC: 5.4 10*3/uL (ref 4.0–10.5)
nRBC: 0 % (ref 0.0–0.2)

## 2022-04-12 LAB — LDL CHOLESTEROL, DIRECT: Direct LDL: 23.6 mg/dL (ref 0–99)

## 2022-04-12 NOTE — Telephone Encounter (Signed)
Spoke to patient about scheduled Tee for Monday May 8. He is aware. Aware to hold Demadex and spironolactone am of procedure. Has transportation to and from procedure. ?

## 2022-04-12 NOTE — Telephone Encounter (Signed)
Pre procedure phone call

## 2022-04-14 NOTE — Anesthesia Preprocedure Evaluation (Addendum)
Anesthesia Evaluation  ?Patient identified by MRN, date of birth, ID band ?Patient awake ? ? ? ?Reviewed: ?Allergy & Precautions, NPO status , Patient's Chart, lab work & pertinent test results ? ?Airway ?Mallampati: II ? ?TM Distance: >3 FB ?Neck ROM: Full ? ? ? Dental ? ?(+) Edentulous Upper, Loose,  ?  ?Pulmonary ?neg pulmonary ROS,  ?  ?Pulmonary exam normal ?breath sounds clear to auscultation ? ? ? ? ? ? Cardiovascular ?hypertension, +CHF  ?negative cardio ROS ?Normal cardiovascular exam ?Rhythm:Regular Rate:Normal ? ?Mobile filamentous structure in RA seen best on apical 4 chamber view  ?? chiarri network but cannot r/o mass/thrombus Consider f/u TEE if  ?clinically indicated.  ??2. Left ventricular ejection fraction, by estimation, is 50 to 55%. The  ?left ventricle has low normal function. The left ventricle demonstrates  ?global hypokinesis. The left ventricular internal cavity size was mildly  ?dilated. Left ventricular diastolic  ?parameters were normal. The average left ventricular global longitudinal  ?strain is -16.3 %. The global longitudinal strain is normal.  ??3. Right ventricular systolic function is normal. The right ventricular  ?size is normal. There is mildly elevated pulmonary artery systolic  ?pressure.  ??4. The mitral valve is normal in structure. Trivial mitral valve  ?regurgitation. No evidence of mitral stenosis.  ??5. The aortic valve is tricuspid. Aortic valve regurgitation is trivial.  ?No aortic stenosis is present.  ??6. The inferior vena cava is normal in size with greater than 50%  ?respiratory variability, suggesting right atrial pressure of 3 mmHg.  ?  ?Neuro/Psych ?negative neurological ROS ? negative psych ROS  ? GI/Hepatic ?negative GI ROS, Neg liver ROS, GERD  ,  ?Endo/Other  ?negative endocrine ROS ? Renal/GU ?negative Renal ROS  ?negative genitourinary ?  ?Musculoskeletal ?negative musculoskeletal ROS ?(+) Arthritis ,  ? Abdominal ?   ?Peds ?negative pediatric ROS ?(+)  Hematology ?negative hematology ROS ?(+) Blood dyscrasia, anemia ,   ?Anesthesia Other Findings ? ? Reproductive/Obstetrics ?negative OB ROS ? ?  ? ? ? ? ? ? ? ? ? ? ? ? ? ?  ?  ? ? ? ? ? ? ? ?Anesthesia Physical ?Anesthesia Plan ? ?ASA: 3 ? ?Anesthesia Plan: MAC  ? ?Post-op Pain Management: Minimal or no pain anticipated  ? ?Induction:  ? ?PONV Risk Score and Plan: 1 and Propofol infusion and Treatment may vary due to age or medical condition ? ?Airway Management Planned: Natural Airway ? ?Additional Equipment: None ? ?Intra-op Plan:  ? ?Post-operative Plan:  ? ?Informed Consent: I have reviewed the patients History and Physical, chart, labs and discussed the procedure including the risks, benefits and alternatives for the proposed anesthesia with the patient or authorized representative who has indicated his/her understanding and acceptance.  ? ? ? ? ? ?Plan Discussed with: Anesthesiologist and CRNA ? ?Anesthesia Plan Comments:   ? ? ? ? ? ?Anesthesia Quick Evaluation ? ?

## 2022-04-15 ENCOUNTER — Other Ambulatory Visit: Payer: Self-pay

## 2022-04-15 ENCOUNTER — Encounter (HOSPITAL_COMMUNITY)
Admission: RE | Disposition: A | Discharge status: Home / Self Care | Payer: Self-pay | Source: Home / Self Care | Attending: Cardiology

## 2022-04-15 ENCOUNTER — Ambulatory Visit (HOSPITAL_COMMUNITY)
Admission: RE | Admit: 2022-04-15 | Discharge: 2022-04-15 | Disposition: A | Discharge status: Home / Self Care | Payer: Medicare Other | Attending: Cardiology | Admitting: Cardiology

## 2022-04-15 ENCOUNTER — Ambulatory Visit (HOSPITAL_BASED_OUTPATIENT_CLINIC_OR_DEPARTMENT_OTHER)
Admission: RE | Admit: 2022-04-15 | Discharge: 2022-04-15 | Disposition: A | Discharge status: Home / Self Care | Payer: Medicare Other | Source: Home / Self Care | Attending: Cardiology | Admitting: Cardiology

## 2022-04-15 ENCOUNTER — Ambulatory Visit (HOSPITAL_COMMUNITY): Payer: Medicare Other | Admitting: Anesthesiology

## 2022-04-15 ENCOUNTER — Ambulatory Visit (HOSPITAL_BASED_OUTPATIENT_CLINIC_OR_DEPARTMENT_OTHER): Payer: Medicare Other | Admitting: Anesthesiology

## 2022-04-15 ENCOUNTER — Encounter (HOSPITAL_COMMUNITY): Payer: Self-pay | Admitting: Cardiology

## 2022-04-15 DIAGNOSIS — Q209 Congenital malformation of cardiac chambers and connections, unspecified: Secondary | ICD-10-CM

## 2022-04-15 DIAGNOSIS — I509 Heart failure, unspecified: Secondary | ICD-10-CM | POA: Insufficient documentation

## 2022-04-15 DIAGNOSIS — I110 Hypertensive heart disease with heart failure: Secondary | ICD-10-CM

## 2022-04-15 DIAGNOSIS — I083 Combined rheumatic disorders of mitral, aortic and tricuspid valves: Secondary | ICD-10-CM

## 2022-04-15 DIAGNOSIS — D649 Anemia, unspecified: Secondary | ICD-10-CM

## 2022-04-15 DIAGNOSIS — R931 Abnormal findings on diagnostic imaging of heart and coronary circulation: Secondary | ICD-10-CM

## 2022-04-15 HISTORY — PX: TEE WITHOUT CARDIOVERSION: SHX5443

## 2022-04-15 LAB — GLUCOSE, CAPILLARY: Glucose-Capillary: 83 mg/dL (ref 70–99)

## 2022-04-15 SURGERY — ECHOCARDIOGRAM, TRANSESOPHAGEAL
Anesthesia: Monitor Anesthesia Care

## 2022-04-15 MED ORDER — PROPOFOL 500 MG/50ML IV EMUL
INTRAVENOUS | Status: DC | PRN
  Administered 2022-04-15: 100 ug/kg/min via INTRAVENOUS

## 2022-04-15 MED ORDER — SODIUM CHLORIDE 0.9 % IV SOLN
INTRAVENOUS | Status: DC
Start: 2022-04-15 — End: 2022-04-15

## 2022-04-15 MED ORDER — SODIUM CHLORIDE 0.9 % IV SOLN: INTRAVENOUS | Status: DC

## 2022-04-15 MED ORDER — PROPOFOL 10 MG/ML IV BOLUS
INTRAVENOUS | Status: DC | PRN
  Administered 2022-04-15 (×2): 20 mg via INTRAVENOUS
  Administered 2022-04-15: 30 mg via INTRAVENOUS

## 2022-04-15 NOTE — Discharge Instructions (Signed)

## 2022-04-15 NOTE — CV Procedure (Signed)
Procedure: TEE ? ?Indication: ?RA mass on echo.  ? ?Sedation: per anesthesiology ? ?Findings: Please see echo section for full report.  Normal LV size and wall thickness, EF 50-55% without regional WMAs. Normal RV size and systolic function. Normal right and left atrial sizes.  No LA appendage thrombus.  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  No PFO/ASD by color doppler.  Trivial tricuspid regurgitation.  Trivial mitral regurgitation.  Trileaflet aortic valve with no stenosis and trivial regurgitation.  Normal caliber aorta with minimal plaque.   ? ?No concerning RA mass.  ? ?Austin [name] ?04/15/2022 ?8:04 AM ? ?

## 2022-04-15 NOTE — H&P (Signed)
?  ? ?Advanced Heart Failure Team History and Physical Note ?  ?PCP:  [name], FNP  ?PCP-Cardiology: Donato Heinz, MD    ? ?Reason for Admission: RA mass ? ? ?HPI:   ?66 y.o. with history of CHF.  Recent echo with RA mass.  TEE to assess. ? ? ?Review of Systems: [y] = yes, '[ ]'$  = no  ? ?General: Weight gain '[ ]'$ ; Weight loss '[ ]'$ ; Anorexia '[ ]'$ ; Fatigue '[ ]'$ ; Fever '[ ]'$ ; Chills '[ ]'$ ; Weakness '[ ]'$   ?Cardiac: Chest pain/pressure '[ ]'$ ; Resting SOB '[ ]'$ ; Exertional SOB '[ ]'$ ; Orthopnea '[ ]'$ ; Pedal Edema '[ ]'$ ; Palpitations '[ ]'$ ; Syncope '[ ]'$ ; Presyncope '[ ]'$ ; Paroxysmal nocturnal dyspnea'[ ]'$   ?Pulmonary: Cough '[ ]'$ ; Wheezing'[ ]'$ ; Hemoptysis'[ ]'$ ; Sputum '[ ]'$ ; Snoring '[ ]'$   ?GI: Vomiting'[ ]'$ ; Dysphagia'[ ]'$ ; Melena'[ ]'$ ; Hematochezia '[ ]'$ ; Heartburn'[ ]'$ ; Abdominal pain '[ ]'$ ; Constipation '[ ]'$ ; Diarrhea '[ ]'$ ; BRBPR '[ ]'$   ?GU: Hematuria'[ ]'$ ; Dysuria '[ ]'$ ; Nocturia'[ ]'$   ?Vascular: Pain in legs with walking '[ ]'$ ; Pain in feet with lying flat '[ ]'$ ; Non-healing sores '[ ]'$ ; Stroke '[ ]'$ ; TIA '[ ]'$ ; Slurred speech '[ ]'$ ;  ?Neuro: Headaches'[ ]'$ ; Vertigo'[ ]'$ ; Seizures'[ ]'$ ; Paresthesias'[ ]'$ ;Blurred vision '[ ]'$ ; Diplopia '[ ]'$ ; Vision changes '[ ]'$   ?Ortho/Skin: Arthritis '[ ]'$ ; Joint pain '[ ]'$ ; Muscle pain '[ ]'$ ; Joint swelling '[ ]'$ ; Back Pain '[ ]'$ ; Rash '[ ]'$   ?Psych: Depression'[ ]'$ ; Anxiety'[ ]'$   ?Heme: Bleeding problems '[ ]'$ ; Clotting disorders '[ ]'$ ; Anemia '[ ]'$   ?Endocrine: Diabetes '[ ]'$ ; Thyroid dysfunction'[ ]'$  ? ? ?Home Medications ?Prior to Admission medications   ?Medication Sig Start Date End Date Taking? Authorizing Provider  ?carvedilol (COREG) 25 MG tablet Take 1 tablet (25 mg total) by mouth 2 (two) times daily with a meal. ?Patient taking differently: Take 3.125 mg by mouth 2 (two) times daily with a meal. 09/11/21  Yes Larey Dresser, MD  ?empagliflozin (JARDIANCE) 10 MG TABS tablet Take 1 tablet (10 mg total) by mouth daily before breakfast. 02/22/22  Yes Larey Dresser, MD  ?ferrous sulfate 325 (65 FE) MG tablet TAKE 1 TABLET BY MOUTH EVERY DAY 09/14/20  Yes [name], FNP  ?Magnesium Oxide 400 MG CAPS Take 1 capsule (400 mg total) by mouth 2 (two) times daily. ?Patient taking differently: Take 400 mg by mouth daily. 03/04/20  Yes Donato Heinz, MD  ?Multiple Vitamin (MULTIVITAMIN) tablet Take 1 tablet by mouth daily.   Yes [provider]  ?Omega-3 1000 MG CAPS Take 1,000 mg by mouth daily.   Yes [provider]  ?sacubitril-valsartan (ENTRESTO) 97-103 MG TAKE 1 TABLET BY MOUTH 2 (TWO) TIMES DAILY. 02/27/22 02/27/23 Yes [name], FNP  ?spironolactone (ALDACTONE) 25 MG tablet TAKE 1 TABLET BY MOUTH EVERY DAY 10/11/20  Yes Lyda Jester M, PA-C  ?torsemide (DEMADEX) 20 MG tablet Take 20 mg by mouth daily.   Yes [provider]  ?sildenafil (VIAGRA) 100 MG tablet Take 100 mg by mouth daily as needed for erectile dysfunction. 01/07/22   [provider]  ? ? ?Past Medical History: ?Past Medical History:  ?Diagnosis Date  ? Arthritis   ? hands  ? CHF (congestive heart failure) ([place])   ? GERD (gastroesophageal reflux disease)   ? Hypertension   ? Hypomagnesemia   ? Hyponatremia   ? Prostate cancer ([place])   ? ? ?Past Surgical History: ?Past Surgical History:  ?Procedure Laterality  Date  ? LYMPHADENECTOMY Bilateral 01/26/2014  ? Procedure: LYMPHADENECTOMY WITH INDOCYANINE GREEN DYE;  Surgeon: Alexis Frock, MD;  Location: [facility];  Service: Urology;  Laterality: Bilateral;  ? RIGHT/LEFT HEART CATH AND CORONARY ANGIOGRAPHY N/A 02/25/2020  ? Procedure: RIGHT/LEFT HEART CATH AND CORONARY ANGIOGRAPHY;  Surgeon: Burnell Blanks, MD;  Location: [facility];  Service: Cardiovascular;  Laterality: N/A;  ? ROBOT ASSISTED LAPAROSCOPIC RADICAL PROSTATECTOMY N/A 01/26/2014  ? Procedure: ROBOTIC ASSISTED LAPAROSCOPIC RADICAL PROSTATECTOMY;  Surgeon: Alexis Frock, MD;  Location: [facility];  Service: Urology;  Laterality: N/A;  ? SHOULDER SURGERY Left   ? ? ?Family History:  ?Family History  ?Problem Relation Age of Onset  ?  Hypertension Mother   ? Hypertension Father   ? ? ?Social History: ?Social History  ? ?Socioeconomic History  ? Marital status: Married  ?  Spouse name: Not on file  ? Number of children: 2  ? Years of education: Not on file  ? Highest education level: Bachelor's degree (e.g., BA, AB, BS)  ?Occupational History  ? Not on file  ?Tobacco Use  ? Smoking status: Never  ? Smokeless tobacco: Never  ?Vaping Use  ? Vaping Use: Never used  ?Substance and Sexual Activity  ? Alcohol use: Yes  ?  Comment: 1-3 beers daily  ? Drug use: No  ? Sexual activity: Yes  ?Other Topics Concern  ? Not on file  ?Social History Narrative  ? Not on file  ? ?Social Determinants of Health  ? ?Financial Resource Strain: Low Risk   ? Difficulty of Paying Living Expenses: Not hard at all  ?Food Insecurity: No Food Insecurity  ? Worried About Charity fundraiser in the Last Year: Never true  ? Ran Out of Food in the Last Year: Never true  ?Transportation Needs: No Transportation Needs  ? Lack of Transportation (Medical): No  ? Lack of Transportation (Non-Medical): No  ?Physical Activity: Inactive  ? Days of Exercise per Week: 0 days  ? Minutes of Exercise per Session: 0 min  ?Stress: No Stress Concern Present  ? Feeling of Stress : Only a little  ?Social Connections: Not on file  ? ? ?Allergies:  ?Allergies  ?Allergen Reactions  ? Latex Swelling and Rash  ? ? ?Objective:   ? ?Vital Signs:   ?Temp:  [97.8 ?F (36.6 ?C)] 97.8 ?F (36.6 ?C) (05/08 7824) ?Pulse Rate:  [77] 77 (05/08 2353) ?Resp:  [11] 11 (05/08 6144) ?BP: (139)/(89) 139/89 (05/08 3154) ?SpO2:  [100 %] 100 % (05/08 0086) ?Weight:  [68 kg] 68 kg (05/08 7619) ?  ?Filed Weights  ? 04/15/22 5093  ?Weight: 68 kg  ? ? ? ?Physical Exam   ?  ?General:  Well appearing. No respiratory difficulty ?HEENT: Normal ?Neck: Supple. no JVD. Carotids 2+ bilat; no bruits. No lymphadenopathy or thyromegaly appreciated. ?Cor: PMI nondisplaced. Regular rate & rhythm. No rubs, gallops or murmurs. ?Lungs:  Clear ?Abdomen: Soft, nontender, nondistended. No hepatosplenomegaly. No bruits or masses. Good bowel sounds. ?Extremities: No cyanosis, clubbing, rash, edema ?Neuro: Alert & oriented x 3, cranial nerves grossly intact. moves all 4 extremities w/o difficulty. Affect pleasant. ? ? ?Labs  ?  ? ?Basic Metabolic Panel: ?Recent Labs  ?Lab 04/12/22 ?1105  ?NA 131*  ?K 3.6  ?CL 95*  ?CO2 25  ?GLUCOSE 108*  ?BUN 10  ?CREATININE 1.18  ?CALCIUM 9.0  ? ? ?Liver Function Tests: ?No results for input(s): AST, ALT, ALKPHOS, BILITOT, PROT, ALBUMIN in the  last 168 hours. ?No results for input(s): LIPASE, AMYLASE in the last 168 hours. ?No results for input(s): AMMONIA in the last 168 hours. ? ?CBC: ?Recent Labs  ?Lab 04/12/22 ?1105  ?WBC 5.4  ?HGB 13.0  ?HCT 37.6*  ?MCV 96.2  ?PLT PLATELET CLUMPS NOTED ON SMEAR, UNABLE TO ESTIMATE  ? ? ?Cardiac Enzymes: ?No results for input(s): CKTOTAL, CKMB, CKMBINDEX, TROPONINI in the last 168 hours. ? ?BNP: ?BNP (last 3 results) ?Recent Labs  ?  09/11/21 ?7353  ?BNP 71.8  ? ? ?ProBNP (last 3 results) ?No results for input(s): PROBNP in the last 8760 hours. ? ? ?CBG: ?Recent Labs  ?Lab 04/15/22 ?2992  ?GLUCAP 83  ? ? ?Coagulation Studies: ?No results for input(s): LABPROT, INR in the last 72 hours. ? ?Imaging: ?No results found. ? ? ?Assessment/Plan  ? ?TEE today to assess RA mass.  ? ? ?[name], MD ?04/15/2022, 7:43 AM ? ?Advanced Heart Failure Team ?Pager [phone] (M-F; 7a - 5p)  ?Please contact [facility] for night-coverage after hours (4p -7a ) and weekends on [email or web address] ?  ?

## 2022-04-15 NOTE — Anesthesia Postprocedure Evaluation (Signed)
Anesthesia Post Note ? ?Patient: Austin [name] ? ?Procedure(s) Performed: TRANSESOPHAGEAL ECHOCARDIOGRAM (TEE) ? ?  ? ?Patient location during evaluation: Endoscopy ?Anesthesia Type: MAC ?Level of consciousness: awake and alert ?Pain management: pain level controlled ?Vital Signs Assessment: post-procedure vital signs reviewed and stable ?Respiratory status: spontaneous breathing, nonlabored ventilation and respiratory function stable ?Cardiovascular status: blood pressure returned to baseline and stable ?Postop Assessment: no apparent nausea or vomiting ?Anesthetic complications: no ? ? ?No notable events documented. ? ?Last Vitals:  ?Vitals:  ? 04/15/22 0812 04/15/22 3838  ?BP: 129/85 133/90  ?Pulse: 71 72  ?Resp: 19 15  ?Temp:    ?SpO2: 96% 93%  ?  ?Last Pain:  ?Vitals:  ? 04/15/22 0822  ?TempSrc:   ?PainSc: 0-No pain  ? ? ?  ?  ?  ?  ?  ?  ? ?Merlinda Frederick ? ? ? ? ?

## 2022-04-15 NOTE — Progress Notes (Signed)
?  Echocardiogram ?Echocardiogram Transesophageal has been performed. ? ?Fidel Levy ?04/15/2022, 8:28 AM ?

## 2022-04-15 NOTE — Anesthesia Procedure Notes (Signed)
Procedure Name: [place] ?Date/Time: 04/15/2022 7:43 AM ?Performed by: Moshe Salisbury, CRNA ?Pre-anesthesia Checklist: Patient identified, Emergency Drugs available, Suction available and Patient being monitored ?Patient Re-evaluated:Patient Re-evaluated prior to induction ?Oxygen Delivery Method: Nasal cannula ?Placement Confirmation: positive ETCO2 ?Dental Injury: Teeth and Oropharynx as per pre-operative assessment  ? ? ? ? ?

## 2022-04-15 NOTE — Transfer of Care (Signed)
Immediate Anesthesia Transfer of Care Note ? ?Patient: Austin [name] ? ?Procedure(s) Performed: TRANSESOPHAGEAL ECHOCARDIOGRAM (TEE) ? ?Patient Location: Endoscopy Unit ? ?Anesthesia Type:MAC ? ?Level of Consciousness: drowsy and patient cooperative ? ?Airway & Oxygen Therapy: Patient Spontanous Breathing and Patient connected to nasal cannula oxygen ? ?Post-op Assessment: Report given to RN and Post -op Vital signs reviewed and stable ? ?Post vital signs: Reviewed and stable ? ?Last Vitals:  ?Vitals Value Taken Time  ?BP    ?Temp    ?Pulse 79 04/15/22 0803  ?Resp 16 04/15/22 0803  ?SpO2 98 % 04/15/22 0803  ?Vitals shown include unvalidated device data. ? ?Last Pain:  ?Vitals:  ? 04/15/22 0802  ?TempSrc:   ?PainSc: 0-No pain  ?   ? ?  ? ?Complications: No notable events documented. ?

## 2022-04-16 ENCOUNTER — Encounter (HOSPITAL_COMMUNITY): Payer: Self-pay | Admitting: Cardiology

## 2022-04-30 ENCOUNTER — Ambulatory Visit: Payer: Medicare Other

## 2022-06-18 ENCOUNTER — Other Ambulatory Visit (HOSPITAL_COMMUNITY): Payer: Self-pay

## 2022-07-05 ENCOUNTER — Telehealth (HOSPITAL_COMMUNITY): Payer: Self-pay | Admitting: Licensed Clinical Social Worker

## 2022-07-05 ENCOUNTER — Other Ambulatory Visit (HOSPITAL_COMMUNITY): Payer: Self-pay | Admitting: Cardiology

## 2022-07-05 DIAGNOSIS — I110 Hypertensive heart disease with heart failure: Secondary | ICD-10-CM

## 2022-07-05 NOTE — Telephone Encounter (Signed)
H&V Care Navigation CSW Progress Note  Clinical Social Worker received call from pt and pt wife asking for help figuring out how to get [place].  States pharmacy reported no script on file- see script from March 2023 by pt FNP but did not appear to be sent to a pharmacy.  Pt had [employer] last year but has not re-enrolled for this year.  Has HF Rosemarie Ax so can utilize for [employer] too- had clinic staff sent script to pt preferred pharmacy at [facility].  SDOH Screenings   Alcohol Screen: Low Risk  (12/06/2020)   Alcohol Screen    Last Alcohol Screening Score (AUDIT): 5  Depression (PHQ2-9): Low Risk  (02/27/2022)   Depression (PHQ2-9)    PHQ-2 Score: 0  Financial Resource Strain: Low Risk  (02/27/2022)   Overall Financial Resource Strain (CARDIA)    Difficulty of Paying Living Expenses: Not hard at all  Food Insecurity: No Food Insecurity (02/27/2022)   Hunger Vital Sign    Worried About Running Out of Food in the Last Year: Never true    [place] in the Last Year: Never true  Housing: Low Risk  (12/06/2020)   Housing    Last Housing Risk Score: 0  Physical Activity: Inactive (02/27/2022)   Exercise Vital Sign    Days of Exercise per Week: 0 days    Minutes of Exercise per Session: 0 min  Social Connections: Socially Integrated (12/06/2020)   Social Connection and Isolation Panel [NHANES]    Frequency of Communication with Friends and Family: More than three times a week    Frequency of Social Gatherings with Friends and Family: More than three times a week    Attends Religious Services: More than 4 times per year    Active Member of [employer] or Organizations: Yes    Attends Archivist Meetings: More than 4 times per year    Marital Status: Married  Stress: No Stress Concern Present (02/27/2022)   [employer] [place]    Feeling of Stress : Only a little  Tobacco Use: Low Risk  (04/16/2022)   Patient History    Smoking  Tobacco Use: Never    Smokeless Tobacco Use: Never    Passive Exposure: Not on file  Transportation Needs: No Transportation Needs (02/27/2022)   PRAPARE - Transportation    Lack of Transportation (Medical): No    Lack of Transportation (Non-Medical): No     Jorge Ny, LCSW Clinical Social Worker Advanced Heart Failure Clinic Desk#: [phone] Cell#: [phone]

## 2022-07-06 ENCOUNTER — Other Ambulatory Visit (HOSPITAL_COMMUNITY): Payer: Self-pay | Admitting: Cardiology

## 2022-07-06 DIAGNOSIS — I5042 Chronic combined systolic (congestive) and diastolic (congestive) heart failure: Secondary | ICD-10-CM

## 2022-07-10 ENCOUNTER — Other Ambulatory Visit (HOSPITAL_COMMUNITY): Payer: Self-pay | Admitting: Cardiology

## 2022-07-10 DIAGNOSIS — I110 Hypertensive heart disease with heart failure: Secondary | ICD-10-CM

## 2022-07-22 ENCOUNTER — Other Ambulatory Visit (HOSPITAL_COMMUNITY): Payer: Self-pay | Admitting: *Deleted

## 2022-07-22 DIAGNOSIS — I110 Hypertensive heart disease with heart failure: Secondary | ICD-10-CM

## 2022-07-22 MED ORDER — ENTRESTO 97-103 MG PO TABS: 1.0000 | ORAL_TABLET | Freq: Two times a day (BID) | ORAL | 11 refills | Status: DC

## 2022-08-14 ENCOUNTER — Ambulatory Visit (INDEPENDENT_AMBULATORY_CARE_PROVIDER_SITE_OTHER): Payer: Medicare Other | Admitting: Nurse Practitioner

## 2022-08-14 ENCOUNTER — Encounter: Payer: Self-pay | Admitting: Nurse Practitioner

## 2022-08-14 VITALS — BP 140/70 | HR 87 | Temp 98.3°F | Ht 69.0 in | Wt 146.0 lb

## 2022-08-14 DIAGNOSIS — I428 Other cardiomyopathies: Secondary | ICD-10-CM | POA: Diagnosis not present

## 2022-08-14 DIAGNOSIS — R21 Rash and other nonspecific skin eruption: Secondary | ICD-10-CM | POA: Diagnosis not present

## 2022-08-14 DIAGNOSIS — F1010 Alcohol abuse, uncomplicated: Secondary | ICD-10-CM

## 2022-08-14 DIAGNOSIS — Z0000 Encounter for general adult medical examination without abnormal findings: Secondary | ICD-10-CM | POA: Diagnosis not present

## 2022-08-14 DIAGNOSIS — I110 Hypertensive heart disease with heart failure: Secondary | ICD-10-CM | POA: Diagnosis not present

## 2022-08-14 DIAGNOSIS — I5042 Chronic combined systolic (congestive) and diastolic (congestive) heart failure: Secondary | ICD-10-CM | POA: Diagnosis not present

## 2022-08-14 DIAGNOSIS — Z8546 Personal history of malignant neoplasm of prostate: Secondary | ICD-10-CM

## 2022-08-14 DIAGNOSIS — D509 Iron deficiency anemia, unspecified: Secondary | ICD-10-CM

## 2022-08-14 LAB — POCT URINALYSIS DIPSTICK
Bilirubin, UA: NEGATIVE
Blood, UA: NEGATIVE
Glucose, UA: POSITIVE — AB
Ketones, UA: NEGATIVE
Leukocytes, UA: NEGATIVE
Nitrite, UA: NEGATIVE
Protein, UA: NEGATIVE
Spec Grav, UA: 1.015 (ref 1.010–1.025)
Urobilinogen, UA: 1 E.U./dL
pH, UA: 6 (ref 5.0–8.0)

## 2022-08-14 MED ORDER — TRIAMCINOLONE ACETONIDE 0.5 % EX OINT: 1.0000 | TOPICAL_OINTMENT | Freq: Two times a day (BID) | CUTANEOUS | 0 refills | Status: DC

## 2022-08-14 NOTE — Progress Notes (Signed)
I,Austin [name],acting as a Education administrator for [employer], FNP.,have documented all relevant documentation on the behalf of Austin Brine, FNP,as directed by  Austin Brine, FNP while in the presence of Austin [name], [place].  Subjective:     Patient ID: Austin [name] , male    DOB: 1956/02/06 , 66 y.o.   MRN: [id number]   Chief Complaint  Patient presents with   Annual Exam    HPI  Patient presents today for HM. 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 He is exercising by walking at work. He does report that football season is coming and this is when drinks more beer.      Past Medical History:  Diagnosis Date   Arthritis    hands   CHF (congestive heart failure) (HCC)    GERD (gastroesophageal reflux disease)    Hypertension    Hypomagnesemia    Hyponatremia    Prostate cancer ([place])      Family History  Problem Relation Age of Onset   Hypertension Mother    Hypertension Father      Current Outpatient Medications:    carvedilol (COREG) 25 MG tablet, Take 1 tablet (25 mg total) by mouth 2 (two) times daily with a meal. (Patient taking differently: Take 3.125 mg by mouth 2 (two) times daily with a meal.), Disp: 180 tablet, Rfl: 3   empagliflozin (JARDIANCE) 10 MG TABS tablet, Take 1 tablet (10 mg total) by mouth daily before breakfast., Disp: 90 tablet, Rfl: 3   ferrous sulfate 325 (65 FE) MG tablet, TAKE 1 TABLET BY MOUTH EVERY DAY, Disp: 90 tablet, Rfl: 1   Magnesium Oxide 400 MG CAPS, Take 1 capsule (400 mg total) by mouth 2 (two) times daily. (Patient taking differently: Take 400 mg by mouth daily.), Disp: 180 capsule, Rfl: 3   Multiple Vitamin (MULTIVITAMIN) tablet, Take 1 tablet by mouth daily., Disp: , Rfl:    Omega-3 1000 MG CAPS, Take 1,000 mg by mouth daily., Disp: , Rfl:    sacubitril-valsartan (ENTRESTO) 97-103 MG, Take 1 tablet by mouth 2 (two) times daily., Disp: 60 tablet, Rfl: 11   sildenafil (VIAGRA) 100 MG tablet, Take 100 mg by mouth  daily as needed for erectile dysfunction., Disp: , Rfl:    spironolactone (ALDACTONE) 25 MG tablet, TAKE 1 TABLET BY MOUTH EVERY DAY, Disp: 90 tablet, Rfl: 1   torsemide (DEMADEX) 20 MG tablet, Take 20 mg by mouth daily., Disp: , Rfl:    triamcinolone ointment (KENALOG) 0.5 %, Apply 1 Application topically 2 (two) times daily., Disp: 30 g, Rfl: 0   Allergies  Allergen Reactions   Latex Swelling and Rash     Men's preventive visit. Patient Health Questionnaire (PHQ-2) is  [place] Office Visit from 08/14/2022 in [facility]  PHQ-2 Total Score 0     Patient is on a Regular diet. Marital status: Married. Relevant history for alcohol use is:  Social History   Substance and Sexual Activity  Alcohol Use Yes   Comment: 1-3 beers daily   Relevant history for tobacco use is:  Social History   Tobacco Use  Smoking Status Never  Smokeless Tobacco Never  .   Review of Systems  Constitutional: Negative.   HENT: Negative.    Eyes: Negative.   Respiratory: Negative.    Cardiovascular: Negative.   Gastrointestinal: Negative.   Endocrine: Negative.   Genitourinary: Negative.   Musculoskeletal: Negative.   Skin:  Positive for rash (  bilateral arms after working outside - applying menthol cream).  Allergic/Immunologic: Negative.   Neurological: Negative.   Hematological: Negative.   Psychiatric/Behavioral: Negative.       Today's Vitals   08/14/22 1122  BP: (!) 140/70  Pulse: 87  Temp: 98.3 F (36.8 C)  TempSrc: Oral  Weight: 146 lb (66.2 kg)  Height: $Remove'5\' 9"'YHcNnYK$  (1.753 m)   Body mass index is 21.56 kg/m.  Wt Readings from Last 3 Encounters:  08/14/22 146 lb (66.2 kg)  04/15/22 150 lb (68 kg)  03/14/22 149 lb 9.6 oz (67.9 kg)    Objective:  Physical Exam Vitals reviewed.  Constitutional:      Appearance: Normal appearance. He is obese.  HENT:     Head: Normocephalic and atraumatic.     Right Ear: Tympanic membrane, ear canal and external ear  normal. There is no impacted cerumen.     Left Ear: Tympanic membrane, ear canal and external ear normal. There is no impacted cerumen.  Cardiovascular:     Rate and Rhythm: Normal rate and regular rhythm.     Pulses: Normal pulses.     Heart sounds: Normal heart sounds. No murmur heard. Pulmonary:     Effort: Pulmonary effort is normal. No respiratory distress.     Breath sounds: Normal breath sounds.  Abdominal:     General: Abdomen is flat. Bowel sounds are normal. There is no distension.     Palpations: Abdomen is soft.  Genitourinary:    Prostate: Normal.     Rectum: Guaiac result negative.  Musculoskeletal:        General: Normal range of motion.     Cervical back: Normal range of motion and neck supple.  Skin:    General: Skin is warm.     Capillary Refill: Capillary refill takes less than 2 seconds.     Findings: Rash (scaly rash with small open areas and mild amount of moisture to skin) present.  Neurological:     General: No focal deficit present.     Mental Status: He is alert and oriented to person, place, and time.  Psychiatric:        Mood and Affect: Mood normal.        Behavior: Behavior normal.        Thought Content: Thought content normal.        Judgment: Judgment normal.         Assessment And Plan:    1. Encounter for annual physical exam Behavior modifications discussed and diet history reviewed.   Pt will continue to exercise regularly and modify diet with low GI, plant based foods and decrease intake of processed foods.  Recommend intake of daily multivitamin, Vitamin D, and calcium.  Recommend colonoscopy for preventive screenings, as well as recommend immunizations that include influenza, TDAP, and Shingles  2. History of prostate cancer Will check PSA - PSA  3. Hypertensive 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 (HCC) Comments: Blood presure is fairly controlled, continue current medications -  POCT Urinalysis Dipstick (81002) - Microalbumin / Creatinine Urine Ratio - CMP14+EGFR - Lipid panel  4. Iron deficiency anemia, unspecified iron deficiency anemia type Comments: Continue iron supplement, has been doing well.  - CBC - Iron, TIBC and Ferritin Panel  5. NICM (nonischemic cardiomyopathy) ([place]) Comments: Continue f/u with Cardiology, he is doing well since being on Entresto  6. ETOH abuse Comments: Continues to drink when he is watching "sports" but has declined quite  a bit per patient. Encouraged to focus on quitting due to Cardiac history  7. Rash and nonspecific skin eruption Comments: He has a scaly rash to his arms, will treat with steroid cream. Advised to avoid using alcohol and peroxide.  - triamcinolone ointment (KENALOG) 0.5 %; Apply 1 Application topically 2 (two) times daily.  Dispense: 30 g; Refill: 0     Patient was given opportunity to ask questions. Patient verbalized understanding of the plan and was able to repeat key elements of the plan. All questions were answered to their satisfaction.   Austin Brine, FNP   I, Austin Brine, FNP, have reviewed all documentation for this visit. The documentation on 08/14/22 for the exam, diagnosis, procedures, and orders are all accurate and complete.   THE PATIENT IS ENCOURAGED TO PRACTICE SOCIAL DISTANCING DUE TO THE COVID-19 PANDEMIC.

## 2022-08-14 NOTE — Patient Instructions (Signed)
Health Maintenance, Male Adopting a healthy lifestyle and getting preventive care are important in promoting health and wellness. Ask your health care provider about: The right schedule for you to have regular tests and exams. Things you can do on your own to prevent diseases and keep yourself healthy. What should I know about diet, weight, and exercise? Eat a healthy diet  Eat a diet that includes plenty of vegetables, fruits, low-fat dairy products, and lean protein. Do not eat a lot of foods that are high in solid fats, added sugars, or sodium. Maintain a healthy weight Body mass index (BMI) is a measurement that can be used to identify possible weight problems. It estimates body fat based on height and weight. Your health care provider can help determine your BMI and help you achieve or maintain a healthy weight. Get regular exercise Get regular exercise. This is one of the most important things you can do for your health. Most adults should: Exercise for at least 150 minutes each week. The exercise should increase your heart rate and make you sweat (moderate-intensity exercise). Do strengthening exercises at least twice a week. This is in addition to the moderate-intensity exercise. Spend less time sitting. Even light physical activity can be beneficial. Watch cholesterol and blood lipids Have your blood tested for lipids and cholesterol at 67 years of age, then have this test every 5 years. You may need to have your cholesterol levels checked more often if: Your lipid or cholesterol levels are high. You are older than 67 years of age. You are at high risk for heart disease. What should I know about cancer screening? Many types of cancers can be detected early and may often be prevented. Depending on your health history and family history, you may need to have cancer screening at various ages. This may include screening for: Colorectal cancer. Prostate cancer. Skin cancer. Lung  cancer. What should I know about heart disease, diabetes, and high blood pressure? Blood pressure and heart disease High blood pressure causes heart disease and increases the risk of stroke. This is more likely to develop in people who have high blood pressure readings or are overweight. Talk with your health care provider about your target blood pressure readings. Have your blood pressure checked: Every 3-5 years if you are 18-39 years of age. Every year if you are 40 years old or older. If you are between the ages of 65 and 75 and are a current or former smoker, ask your health care provider if you should have a one-time screening for abdominal aortic aneurysm (AAA). Diabetes Have regular diabetes screenings. This checks your fasting blood sugar level. Have the screening done: Once every three years after age 45 if you are at a normal weight and have a low risk for diabetes. More often and at a younger age if you are overweight or have a high risk for diabetes. What should I know about preventing infection? Hepatitis B If you have a higher risk for hepatitis B, you should be screened for this virus. Talk with your health care provider to find out if you are at risk for hepatitis B infection. Hepatitis C Blood testing is recommended for: Everyone born from 1945 through 1965. Anyone with known risk factors for hepatitis C. Sexually transmitted infections (STIs) You should be screened each year for STIs, including gonorrhea and chlamydia, if: You are sexually active and are younger than 67 years of age. You are older than 67 years of age and your   health care provider tells you that you are at risk for this type of infection. Your sexual activity has changed since you were last screened, and you are at increased risk for chlamydia or gonorrhea. Ask your health care provider if you are at risk. Ask your health care provider about whether you are at high risk for HIV. Your health care provider  may recommend a prescription medicine to help prevent HIV infection. If you choose to take medicine to prevent HIV, you should first get tested for HIV. You should then be tested every 3 months for as long as you are taking the medicine. Follow these instructions at home: Alcohol use Do not drink alcohol if your health care provider tells you not to drink. If you drink alcohol: Limit how much you have to 0-2 drinks a day. Know how much alcohol is in your drink. In the [place] one drink equals one 12 oz bottle of beer (355 mL), one 5 oz glass of wine (148 mL), or one 1 oz glass of hard liquor (44 mL). Lifestyle Do not use any products that contain nicotine or tobacco. These products include cigarettes, chewing tobacco, and vaping devices, such as e-cigarettes. If you need help quitting, ask your health care provider. Do not use street drugs. Do not share needles. Ask your health care provider for help if you need support or information about quitting drugs. General instructions Schedule regular health, dental, and eye exams. Stay current with your vaccines. Tell your health care provider if: You often feel depressed. You have ever been abused or do not feel safe at home. Summary Adopting a healthy lifestyle and getting preventive care are important in promoting health and wellness. Follow your health care provider's instructions about healthy diet, exercising, and getting tested or screened for diseases. Follow your health care provider's instructions on monitoring your cholesterol and blood pressure. This information is not intended to replace advice given to you by your health care provider. Make sure you discuss any questions you have with your health care provider. Document Revised: 04/16/2021 Document Reviewed: 04/16/2021 Elsevier Patient Education  2023 Elsevier Inc.  

## 2022-08-15 LAB — MICROALBUMIN / CREATININE URINE RATIO
Creatinine, Urine: 75.3 mg/dL
Microalb/Creat Ratio: 33 mg/g creat — ABNORMAL HIGH (ref 0–29)
Microalbumin, Urine: 24.8 ug/mL

## 2022-08-15 LAB — LIPID PANEL
Chol/HDL Ratio: 2 ratio (ref 0.0–5.0)
Cholesterol, Total: 225 mg/dL — ABNORMAL HIGH (ref 100–199)
HDL: 115 mg/dL (ref >39)
LDL Chol Calc (NIH): 86 mg/dL (ref 0–99)
Triglycerides: 146 mg/dL (ref 0–149)
VLDL Cholesterol Cal: 24 mg/dL (ref 5–40)

## 2022-08-15 LAB — CBC
Hematocrit: 38.3 % (ref 37.5–51.0)
Hemoglobin: 13.3 g/dL (ref 13.0–17.7)
MCH: 33.6 pg — ABNORMAL HIGH (ref 26.6–33.0)
MCHC: 34.7 g/dL (ref 31.5–35.7)
MCV: 97 fL (ref 79–97)
Platelets: 178 10*3/uL (ref 150–450)
RBC: 3.96 x10E6/uL — ABNORMAL LOW (ref 4.14–5.80)
RDW: 13.1 % (ref 11.6–15.4)
WBC: 3.4 10*3/uL (ref 3.4–10.8)

## 2022-08-15 LAB — CMP14+EGFR
ALT: 31 IU/L (ref 0–44)
AST: 66 IU/L — ABNORMAL HIGH (ref 0–40)
Albumin/Globulin Ratio: 1.3 (ref 1.2–2.2)
Albumin: 4.5 g/dL (ref 3.9–4.9)
Alkaline Phosphatase: 88 IU/L (ref 44–121)
BUN/Creatinine Ratio: 7 — ABNORMAL LOW (ref 10–24)
BUN: 8 mg/dL (ref 8–27)
Bilirubin Total: 0.4 mg/dL (ref 0.0–1.2)
CO2: 22 mmol/L (ref 20–29)
Calcium: 9.2 mg/dL (ref 8.6–10.2)
Chloride: 94 mmol/L — ABNORMAL LOW (ref 96–106)
Creatinine, Ser: 1.15 mg/dL (ref 0.76–1.27)
Globulin, Total: 3.6 g/dL (ref 1.5–4.5)
Glucose: 93 mg/dL (ref 70–99)
Potassium: 4.4 mmol/L (ref 3.5–5.2)
Sodium: 133 mmol/L — ABNORMAL LOW (ref 134–144)
Total Protein: 8.1 g/dL (ref 6.0–8.5)
eGFR: 70 mL/min/{1.73_m2} (ref >59)

## 2022-08-15 LAB — IRON,TIBC AND FERRITIN PANEL
Ferritin: 169 ng/mL (ref 30–400)
Iron Saturation: 27 % (ref 15–55)
Iron: 91 ug/dL (ref 38–169)
Total Iron Binding Capacity: 336 ug/dL (ref 250–450)
UIBC: 245 ug/dL (ref 111–343)

## 2022-08-15 LAB — PSA: Prostate Specific Ag, Serum: <0.1 ng/mL (ref 0.0–4.0)

## 2022-09-10 ENCOUNTER — Ambulatory Visit: Payer: Medicare Other

## 2022-09-12 ENCOUNTER — Ambulatory Visit (INDEPENDENT_AMBULATORY_CARE_PROVIDER_SITE_OTHER): Payer: Medicare Other

## 2022-09-12 VITALS — BP 136/68 | HR 98 | Temp 98.5°F | Ht 69.0 in | Wt 146.0 lb

## 2022-09-12 DIAGNOSIS — Z23 Encounter for immunization: Secondary | ICD-10-CM | POA: Diagnosis not present

## 2022-09-12 NOTE — Progress Notes (Signed)
Patient presents today for flu vaccine.  

## 2022-10-16 ENCOUNTER — Encounter: Payer: Self-pay | Admitting: *Deleted

## 2022-10-16 NOTE — Progress Notes (Signed)
[facility] Quality Team Note  Name: Austin [name] Date of Birth: 06/10/1956 MRN: [id number] Date: 10/16/2022  [facility] Quality Team has reviewed this patient's chart, please see recommendations below:  [facility] Quality Other; (Pt has open gaps for diabetic retinopathy screening and A1C.  Tried to call pt but had to leave voice messages yesterday and today.  )

## 2022-12-18 ENCOUNTER — Ambulatory Visit (INDEPENDENT_AMBULATORY_CARE_PROVIDER_SITE_OTHER): Payer: Medicare Other | Admitting: Nurse Practitioner

## 2022-12-18 ENCOUNTER — Encounter: Payer: Self-pay | Admitting: Nurse Practitioner

## 2022-12-18 ENCOUNTER — Other Ambulatory Visit: Payer: Self-pay

## 2022-12-18 VITALS — BP 138/76 | HR 78 | Temp 97.6°F | Ht 69.0 in | Wt 155.0 lb

## 2022-12-18 DIAGNOSIS — Z23 Encounter for immunization: Secondary | ICD-10-CM

## 2022-12-18 DIAGNOSIS — Z8546 Personal history of malignant neoplasm of prostate: Secondary | ICD-10-CM

## 2022-12-18 DIAGNOSIS — D508 Other iron deficiency anemias: Secondary | ICD-10-CM

## 2022-12-18 DIAGNOSIS — I5042 Chronic combined systolic (congestive) and diastolic (congestive) heart failure: Secondary | ICD-10-CM

## 2022-12-18 DIAGNOSIS — R21 Rash and other nonspecific skin eruption: Secondary | ICD-10-CM

## 2022-12-18 DIAGNOSIS — R7309 Other abnormal glucose: Secondary | ICD-10-CM | POA: Diagnosis not present

## 2022-12-18 DIAGNOSIS — I110 Hypertensive heart disease with heart failure: Secondary | ICD-10-CM | POA: Diagnosis not present

## 2022-12-18 MED ORDER — TRIAMCINOLONE ACETONIDE 0.5 % EX OINT: 1.0000 | TOPICAL_OINTMENT | Freq: Two times a day (BID) | CUTANEOUS | 3 refills | Status: DC

## 2022-12-18 NOTE — Patient Instructions (Signed)

## 2022-12-18 NOTE — Progress Notes (Signed)
I,[name],acting as a Education administrator for [employer], FNP.,have documented all relevant documentation on the behalf of [name], FNP,as directed by  [name], FNP while in the presence of [name], [place].  Subjective:     Patient ID: Austin [name] , male    DOB: 17-Jan-1956 , 67 y.o.   MRN: [id number]   Chief Complaint  Patient presents with   Hypertension    HPI  Patient here for a blood pressure check.    Hypertension This is a chronic problem. The current episode started more than 1 year ago. The problem is unchanged. The problem is controlled. Pertinent negatives include no anxiety, blurred vision, chest pain, headaches, peripheral edema or shortness of breath. There are no associated agents to hypertension. Risk factors for coronary artery disease include sedentary lifestyle. Past treatments include diuretics and angiotensin blockers. There are no compliance problems.  There is no history of angina, kidney disease or CAD/MI. There is no history of coarctation of the aorta.     Past Medical History:  Diagnosis Date   Arthritis    hands   CHF (congestive heart failure) (HCC)    GERD (gastroesophageal reflux disease)    Hypertension    Hypomagnesemia    Hyponatremia    Prostate cancer ([place])      Family History  Problem Relation Age of Onset   Hypertension Mother    Hypertension Father      Current Outpatient Medications:    carvedilol (COREG) 25 MG tablet, Take 1 tablet (25 mg total) by mouth 2 (two) times daily with a meal. (Patient taking differently: Take 3.125 mg by mouth 2 (two) times daily with a meal.), Disp: 180 tablet, Rfl: 3   empagliflozin (JARDIANCE) 10 MG TABS tablet, Take 1 tablet (10 mg total) by mouth daily before breakfast., Disp: 90 tablet, Rfl: 3   ferrous sulfate 325 (65 FE) MG tablet, TAKE 1 TABLET BY MOUTH EVERY DAY, Disp: 90 tablet, Rfl: 1   Magnesium Oxide 400 MG CAPS, Take 1 capsule (400 mg total) by mouth 2 (two) times daily., Disp:  180 capsule, Rfl: 3   Multiple Vitamin (MULTIVITAMIN) tablet, Take 1 tablet by mouth daily., Disp: , Rfl:    Omega-3 1000 MG CAPS, Take 1,000 mg by mouth daily., Disp: , Rfl:    sacubitril-valsartan (ENTRESTO) 97-103 MG, Take 1 tablet by mouth 2 (two) times daily., Disp: 60 tablet, Rfl: 11   sildenafil (VIAGRA) 100 MG tablet, Take 100 mg by mouth daily as needed for erectile dysfunction., Disp: , Rfl:    spironolactone (ALDACTONE) 25 MG tablet, TAKE 1 TABLET BY MOUTH EVERY DAY, Disp: 90 tablet, Rfl: 1   torsemide (DEMADEX) 20 MG tablet, Take 20 mg by mouth daily., Disp: , Rfl:    triamcinolone ointment (KENALOG) 0.5 %, Apply 1 Application topically 2 (two) times daily., Disp: 30 g, Rfl: 3   Allergies  Allergen Reactions   Latex Swelling and Rash     Review of Systems  Constitutional: Negative.   Eyes:  Negative for blurred vision.  Respiratory: Negative.  Negative for shortness of breath.   Cardiovascular: Negative.  Negative for chest pain.  Gastrointestinal: Negative.   Neurological: Negative.  Negative for headaches.     Today's Vitals   12/18/22 1137  BP: 138/76  Pulse: 78  Temp: 97.6 F (36.4 C)  TempSrc: Oral  Weight: 155 lb (70.3 kg)  Height: '5\' 9"'$  (1.753 m)   Body mass index is 22.89 kg/m.  Objective:  Physical Exam Vitals reviewed.  Constitutional:      General: He is not in acute distress.    Appearance: Normal appearance.  Cardiovascular:     Rate and Rhythm: Normal rate and regular rhythm.     Pulses: Normal pulses.     Heart sounds: Normal heart sounds. No murmur heard. Pulmonary:     Effort: Pulmonary effort is normal. No respiratory distress.     Breath sounds: Normal breath sounds. No wheezing.  Musculoskeletal:        General: No swelling or tenderness.     Right lower leg: No edema.     Left lower leg: No edema.  Skin:    General: Skin is warm and dry.     Findings: Rash (bilateral lower extremities with dry scaly rash) present.   Neurological:     General: No focal deficit present.     Mental Status: He is alert and oriented to person, place, and time.     Cranial Nerves: No cranial nerve deficit.     Motor: No weakness.  Psychiatric:        Mood and Affect: Mood normal.        Behavior: Behavior normal.        Thought Content: Thought content normal.        Judgment: Judgment normal.         Assessment And Plan:     1. Hypertensive 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 ([place]) Comments: Blood pressure is fairly controlled, continue current medications. - BMP8+EGFR  2. Elevated glucose Comments: Will check HgbA1c due to having a slightly elevated glucose at last visit, no diagnosis of diabetes. He is taking [place] for his CHF - Hemoglobin A1c - BMP8+EGFR  3. Chronic combined systolic and diastolic congestive heart failure ([place]) Comments: Continue f/u with Cardiology and current medications.  4. Rash and nonspecific skin eruption Comments: Scaly rash to bilateral lower extremities, continue using Triamncinilone - triamcinolone ointment (KENALOG) 0.5 %; Apply 1 Application topically 2 (two) times daily.  Dispense: 30 g; Refill: 3  5. Iron deficiency anemia secondary to inadequate dietary iron intake Comments: Well controlled, continue iron supplement. - Lipid panel  6. History of prostate cancer Comments: He had a prostatectomy and is due to see the Oncologist  7. Encounter for immunization Comments: Shingrix #2 in office, patient signed TransRx - Varicella-zoster vaccine IM     Patient was given opportunity to ask questions. Patient verbalized understanding of the plan and was able to repeat key elements of the plan. All questions were answered to their satisfaction.  [name], FNP   I, [name], FNP, have reviewed all documentation for this visit. The documentation on 12/18/22 for the exam, diagnosis, procedures, and orders are all accurate  and complete.   IF YOU HAVE BEEN REFERRED TO A SPECIALIST, IT MAY TAKE 1-2 WEEKS TO SCHEDULE/PROCESS THE REFERRAL. IF YOU HAVE NOT HEARD FROM US/SPECIALIST IN TWO WEEKS, PLEASE GIVE [place] A CALL AT [phone] X [phone].   THE PATIENT IS ENCOURAGED TO PRACTICE SOCIAL DISTANCING DUE TO THE COVID-19 PANDEMIC.

## 2022-12-19 LAB — HEMOGLOBIN A1C
Est. average glucose Bld gHb Est-mCnc: 103 mg/dL
Hgb A1c MFr Bld: 5.2 % (ref 4.8–5.6)

## 2022-12-19 LAB — BMP8+EGFR
BUN/Creatinine Ratio: 7 — ABNORMAL LOW (ref 10–24)
BUN: 9 mg/dL (ref 8–27)
CO2: 22 mmol/L (ref 20–29)
Calcium: 8.6 mg/dL (ref 8.6–10.2)
Chloride: 94 mmol/L — ABNORMAL LOW (ref 96–106)
Creatinine, Ser: 1.25 mg/dL (ref 0.76–1.27)
Glucose: 93 mg/dL (ref 70–99)
Potassium: 4.2 mmol/L (ref 3.5–5.2)
Sodium: 130 mmol/L — ABNORMAL LOW (ref 134–144)
eGFR: 63 mL/min/{1.73_m2} (ref >59)

## 2022-12-19 LAB — LIPID PANEL
Chol/HDL Ratio: 1.5 ratio (ref 0.0–5.0)
Cholesterol, Total: 178 mg/dL (ref 100–199)
HDL: 117 mg/dL (ref >39)
LDL Chol Calc (NIH): 48 mg/dL (ref 0–99)
Triglycerides: 69 mg/dL (ref 0–149)
VLDL Cholesterol Cal: 13 mg/dL (ref 5–40)

## 2023-01-01 ENCOUNTER — Other Ambulatory Visit (HOSPITAL_COMMUNITY): Payer: Self-pay

## 2023-02-06 ENCOUNTER — Telehealth (HOSPITAL_COMMUNITY): Payer: Self-pay | Admitting: Pharmacy Technician

## 2023-02-06 NOTE — Telephone Encounter (Signed)
/  Advanced Heart Failure Patient Advocate Encounter  The patient was approved for a [employer] grant that will help cover the cost of Entresto, Jardiance, Coreg and Spironolactone. Total amount awarded, $10,000. Eligibility, 01/07/23 - 01/07/24.  ID [id number]  BIN [id number]  PCN [id number]  Group [id number]  Emailed patient copy of grant information.  Charlann Boxer, CPhT

## 2023-03-06 ENCOUNTER — Ambulatory Visit: Payer: Medicare Other | Admitting: Nurse Practitioner

## 2023-03-12 ENCOUNTER — Telehealth: Payer: Self-pay

## 2023-03-12 NOTE — Telephone Encounter (Signed)
This nurse called patient for AWV. Person that answered phone stated that he was at work and does not get off until 11pm.

## 2023-03-18 ENCOUNTER — Encounter (HOSPITAL_COMMUNITY): Payer: Self-pay | Admitting: Cardiology

## 2023-03-18 ENCOUNTER — Ambulatory Visit (HOSPITAL_COMMUNITY)
Admission: RE | Admit: 2023-03-18 | Discharge: 2023-03-18 | Disposition: A | Discharge status: Home / Self Care | Payer: Medicare Other | Source: Ambulatory Visit | Attending: Cardiology | Admitting: Cardiology

## 2023-03-18 VITALS — BP 110/78 | HR 85 | Wt 154.0 lb

## 2023-03-18 DIAGNOSIS — Z79899 Other long term (current) drug therapy: Secondary | ICD-10-CM | POA: Insufficient documentation

## 2023-03-18 DIAGNOSIS — F1010 Alcohol abuse, uncomplicated: Secondary | ICD-10-CM | POA: Insufficient documentation

## 2023-03-18 DIAGNOSIS — I428 Other cardiomyopathies: Secondary | ICD-10-CM | POA: Diagnosis not present

## 2023-03-18 DIAGNOSIS — Z8249 Family history of ischemic heart disease and other diseases of the circulatory system: Secondary | ICD-10-CM | POA: Insufficient documentation

## 2023-03-18 DIAGNOSIS — I5022 Chronic systolic (congestive) heart failure: Secondary | ICD-10-CM | POA: Diagnosis not present

## 2023-03-18 DIAGNOSIS — Z7984 Long term (current) use of oral hypoglycemic drugs: Secondary | ICD-10-CM | POA: Insufficient documentation

## 2023-03-18 DIAGNOSIS — I110 Hypertensive heart disease with heart failure: Secondary | ICD-10-CM | POA: Insufficient documentation

## 2023-03-18 DIAGNOSIS — I5042 Chronic combined systolic (congestive) and diastolic (congestive) heart failure: Secondary | ICD-10-CM | POA: Diagnosis not present

## 2023-03-18 LAB — BASIC METABOLIC PANEL
Anion gap: 9 (ref 5–15)
BUN: 11 mg/dL (ref 8–23)
CO2: 25 mmol/L (ref 22–32)
Calcium: 8.8 mg/dL — ABNORMAL LOW (ref 8.9–10.3)
Chloride: 97 mmol/L — ABNORMAL LOW (ref 98–111)
Creatinine, Ser: 1.02 mg/dL (ref 0.61–1.24)
GFR, Estimated: >60 mL/min (ref >60)
Glucose, Bld: 90 mg/dL (ref 70–99)
Potassium: 3.9 mmol/L (ref 3.5–5.1)
Sodium: 131 mmol/L — ABNORMAL LOW (ref 135–145)

## 2023-03-18 NOTE — Progress Notes (Signed)
PCP: [name], FNP Cardiology: Dr. Bjorn Pippin HF Cardiology: Dr. Shirlee Latch  67 y.o. with history of ETOH abuse and prior HTN returns for followup of nonischemic cardiomyopathy. Patient had no known prior cardiac history.  In 2/21, he developed exertional dyspnea and was noted at an urgent care center to have CHF.  Echo in 3/21 showed EF < 20% with severe RV dysfunction.  RHC/LHC in 3/21 showed no CAD but R > L filling pressure elevation and preserved cardiac index.  He was admitted in 4/21 with acute/chronic systolic CHF.  He was diuresed and cardiac meds were adjusted.  Cardiac MRI this admission showed EF 14%, moderate LV dilation, severely decreased RV function, and moderate pericardial effusion. There was no evidence for myocarditis noted.   Patient, of note, has a history of heavy ETOH intake for decades. Per his wife, he would drink heavily every day after getting home from work and especially heavily on the weekend. He has a history of ETOH withdrawal.  He does have some family history of CHF (sister, maybe his mother), but he does not have a lot of detail about these diagnoses. No drugs (cocaine, amphetamines).   Echo in 8/21 showed EF 30-35%, diffuse hypokinesis, normal RV, no pericardial effusion. Echo in 10/21 showed EF 45-50% with global hypokinesis.  Echo in 3/22 with EF 45-50%, normal RV, PASP 34 mmHg. TEE in 5/23 showed EF 50-55%, normal RV, trivial MR, mobile RA structure seen on TTE was [facility].   Symptomatically, he seems to be doing well.  No significant exertional dyspnea though he says that he "moves slower" than in the past.  He is still drinking ETOH occasionally, usually when he watches sports with friends, but not as much as in the past.  No orthopnea/PND.  No chest pain.  No lightheadedness or palpitations.      ECG (personally reviewed):NSR, LBBB 156 msec  Labs (4/21): HIV negative, K 4.2, creatinine 0.93 Labs (5/21): K 4.2, creatinine 1.23, digoxin 0.6 Labs (7/21):  K 3.8, creatinine 1.49 Labs (10/21): K 4.2, creatinine 1.25, BNP 14 Labs (12/21): K 4.3, creatinine 1.14 Labs (3/22): K 4.4, creatinine 1.34 Labs (7/22): K 3.9, creatinine 1.19 Labs (3/23): K 4.6, creatinine 1.29 Labs (1/24): LDL 48, K 4.2, creatinine 6.97  PMH: 1. Prostate cancer 2. HTN: BP now low.  3. Fe deficiency anemia 4. ETOH abuse 5. GERD 6. Chronic systolic CHF: Nonischemic cardiomyopathy.  - Echo 3/21 with EF < 20%, severe global HK, severely dilated and severely dysfunctional RV, severe biatrial enlargement.  - RHC/LHC (3/21): No CAD; mean RA 15, PA 51/24, mean PCWP 19, CI 3.96.  - HIV negative 4/21 - Cardiac MRI (4/21): LV EF 14%, moderate LV dilation, mild RV dilation with severely decreased systolic function, moderate pericardial effusion, no LGE noted but images difficult due to recent Fe infusion.  - Echo (8/21): EF 30-35%, diffuse hypokinesis, normal RV, no pericardial fluid.  - Echo (10/21): EF 45-50%, diffuse hypokinesis, normal RV.  - Echo (3/22): EF 45-50%, normal RV, PASP 34 mmHg.  - TEE (5/23): EF 50-55%, normal RV, trivial MR, mobile RA structure seen on TTE was [facility].   FH: Sister with CHF, brother with pacemaker, mother with CHF.   SH: Married, lives in [place], heavy ETOH in the past has now cut back, no drugs.  He was a Estate agent.   ROS: All systems reviewed and negative except as per HPI.   Current Outpatient Medications  Medication Sig Dispense Refill   carvedilol (  COREG) 25 MG tablet Take 1 tablet (25 mg total) by mouth 2 (two) times daily with a meal. 180 tablet 3   empagliflozin (JARDIANCE) 10 MG TABS tablet Take 1 tablet (10 mg total) by mouth daily before breakfast. 90 tablet 3   ferrous sulfate 325 (65 FE) MG tablet TAKE 1 TABLET BY MOUTH EVERY DAY 90 tablet 1   Magnesium Oxide 400 MG CAPS Take 1 capsule (400 mg total) by mouth 2 (two) times daily. 180 capsule 3   Multiple Vitamin (MULTIVITAMIN) tablet Take 1 tablet by mouth  daily.     Omega-3 1000 MG CAPS Take 1,000 mg by mouth daily.     sacubitril-valsartan (ENTRESTO) 97-103 MG Take 1 tablet by mouth 2 (two) times daily. 60 tablet 11   sildenafil (VIAGRA) 100 MG tablet Take 100 mg by mouth daily as needed for erectile dysfunction.     spironolactone (ALDACTONE) 25 MG tablet TAKE 1 TABLET BY MOUTH EVERY DAY 90 tablet 1   torsemide (DEMADEX) 20 MG tablet Take 20 mg by mouth daily.     triamcinolone ointment (KENALOG) 0.5 % Apply 1 Application topically 2 (two) times daily. 30 g 3   No current facility-administered medications for this encounter.   BP 110/78   Pulse 85   Wt 69.9 kg (154 lb)   SpO2 100%   BMI 22.74 kg/m  General: NAD Neck: No JVD, no thyromegaly or thyroid nodule.  Lungs: Clear to auscultation bilaterally with normal respiratory effort. CV: Nondisplaced PMI.  Heart regular S1/S2, no S3/S4, no murmur.  No peripheral edema.  No carotid bruit.  Normal pedal pulses.  Abdomen: Soft, nontender, no hepatosplenomegaly, no distention.  Skin: Intact without lesions or rashes.  Neurologic: Alert and oriented x 3.  Psych: Normal affect. Extremities: No clubbing or cyanosis.  HEENT: Normal.   Assessment/Plan: 1. Chronic systolic CHF: Nonischemic cardiomyopathy.  Echo from 3/21 showed EF 20% with moderately decreased RV systolic function by my read.  RHC/LHC from 3/21 showed no significant coronary disease, preserved cardiac output, R>L heart failure.  Cardiac MRI in 4/21 showed EF 14%, severe RV dysfunction, no LGE.  HIV negative.  Patient has an extensive history of heavy ETOH intake; it is very possible that this is ETOH cardiomyopathy.  No LGE noted on cardiac MRI but difficult LGE images, cannot rule out prior myocarditis.  Family history is not fully defined.  He has not been noted to have frequent PVCs.  Echo in 10/21 showed EF improved to 45-50%, echo in 4/22 showed stable EF 45-50%.  TEE in 5/23 showed EF 50-55%, normal RV, trivial MR, mobile RA  structure seen on TTE was [facility].  NYHA class I-II.  He is not volume overloaded.  He has cut back on ETOH but still drinks some.  - Continue empagliflozin 10 mg daily.    - Continue spironolactone 25 daily. BMET today.  - Continue Entresto 97/103 bid.  - No need for a loop diuretic.  - Continue Coreg 25 mg bid.   - I will arrange for repeat echo at followup appt.  - He needs to keep ETOH minimal.   2. ETOH abuse: Long history of heavy ETOH.  He has cut back considerably.  This may be the cause of his cardiomyopathy.   - I strongly encouraged him to keep ETOH minimal.    Followup in 6 months with echo   Marca Ancona 03/18/2023

## 2023-03-18 NOTE — Patient Instructions (Signed)
There has been no changes to your medications.  Labs done today, your results will be available in MyChart, we will contact you for abnormal readings.  Your physician has requested that you have an echocardiogram. Echocardiography is a painless test that uses sound waves to create images of your heart. It provides your doctor with information about the size and shape of your heart and how well your heart's chambers and valves are working. This procedure takes approximately one hour. There are no restrictions for this procedure. Please do NOT wear cologne, perfume, aftershave, or lotions (deodorant is allowed). Please arrive 15 minutes prior to your appointment time.  Your physician recommends that you schedule a follow-up appointment in: 6 months with an echocardiogram ( October) ** please call the office in July to arrange your follow up appointment. **  If you have any questions or concerns before your next appointment please send [place] a message through [place] or call our office at [phone].    TO LEAVE A MESSAGE FOR THE NURSE SELECT OPTION 2, PLEASE LEAVE A MESSAGE INCLUDING: YOUR NAME DATE OF BIRTH CALL BACK NUMBER REASON FOR CALL**this is important as we prioritize the call backs  YOU WILL RECEIVE A CALL BACK THE SAME DAY AS LONG AS YOU CALL BEFORE 4:00 PM  At the [facility], you and your health needs are our priority. As part of our continuing mission to provide you with exceptional heart care, we have created designated Provider Care Teams. These Care Teams include your primary Cardiologist (physician) and Advanced Practice Providers (APPs- Physician Assistants and Nurse Practitioners) who all work together to provide you with the care you need, when you need it.   You may see any of the following providers on your designated Care Team at your next follow up: Dr Arvilla Meres Dr Marca Ancona Dr. Marcos Eke, NP Robbie Lis, [place] [facility] [place], [place] Brynda Peon, NP Karle Plumber, PharmD   Please be sure to bring in all your medications bottles to every appointment.    Thank you for choosing [facility]

## 2023-03-21 ENCOUNTER — Telehealth: Payer: Self-pay | Admitting: Nurse Practitioner

## 2023-03-21 NOTE — Telephone Encounter (Signed)
Contacted Austin [name] to schedule their annual wellness visit. Call back at later date: 03/24/23  Austin [name] AWV direct phone # [phone]

## 2023-03-24 ENCOUNTER — Telehealth: Payer: Self-pay | Admitting: Nurse Practitioner

## 2023-03-24 NOTE — Telephone Encounter (Signed)
Contacted [name] to schedule their annual wellness visit. Appointment made for 03/27/23.  Rudell Cobb AWV direct phone # [phone]

## 2023-03-27 ENCOUNTER — Telehealth: Payer: Self-pay

## 2023-03-27 NOTE — Telephone Encounter (Signed)
This nurse called patient due to running late for AWV. Patient's wife answered phone and stated that patient was called back in to work. She will have him call back to reschedule for another time.

## 2023-04-01 ENCOUNTER — Telehealth: Payer: Self-pay | Admitting: Nurse Practitioner

## 2023-04-01 NOTE — Telephone Encounter (Signed)
Called patient to schedule Medicare Annual Wellness Visit (AWV). Left message for patient to call back and schedule Medicare Annual Wellness Visit (AWV).  Last date of AWV: 02/27/22  Please schedule an appointment at any time with [facility].  If any questions, please contact me at [phone].  Thank you ,  Rudell Cobb AWV direct phone # [phone]

## 2023-04-21 ENCOUNTER — Ambulatory Visit: Payer: Medicare Other | Admitting: Nurse Practitioner

## 2023-04-21 ENCOUNTER — Telehealth: Payer: Self-pay | Admitting: Nurse Practitioner

## 2023-04-21 NOTE — Progress Notes (Deleted)
Hershal Coria Simon Aaberg,acting as a Neurosurgeon for [name], FNP.,have documented all relevant documentation on the behalf of [name], FNP,as directed by  [name], FNP while in the presence of [name], FNP.    Subjective:     Patient ID: Austin [name] , male    DOB: October 08, 1956 , 67 y.o.   MRN: [id number]   No chief complaint on file.   HPI  Patient presents today for BP check, patient states compliance with medications and has no other concerns today.    Past Medical History:  Diagnosis Date  . Arthritis    hands  . CHF (congestive heart failure) (HCC)   . GERD (gastroesophageal reflux disease)   . Hypertension   . Hypomagnesemia   . Hyponatremia   . Prostate cancer [facility])      Family History  Problem Relation Age of Onset  . Hypertension Mother   . Hypertension Father      Current Outpatient Medications:  .  carvedilol (COREG) 25 MG tablet, Take 1 tablet (25 mg total) by mouth 2 (two) times daily with a meal., Disp: 180 tablet, Rfl: 3 .  empagliflozin (JARDIANCE) 10 MG TABS tablet, Take 1 tablet (10 mg total) by mouth daily before breakfast., Disp: 90 tablet, Rfl: 3 .  ferrous sulfate 325 (65 FE) MG tablet, TAKE 1 TABLET BY MOUTH EVERY DAY, Disp: 90 tablet, Rfl: 1 .  Magnesium Oxide 400 MG CAPS, Take 1 capsule (400 mg total) by mouth 2 (two) times daily., Disp: 180 capsule, Rfl: 3 .  Multiple Vitamin (MULTIVITAMIN) tablet, Take 1 tablet by mouth daily., Disp: , Rfl:  .  Omega-3 1000 MG CAPS, Take 1,000 mg by mouth daily., Disp: , Rfl:  .  sacubitril-valsartan (ENTRESTO) 97-103 MG, Take 1 tablet by mouth 2 (two) times daily., Disp: 60 tablet, Rfl: 11 .  sildenafil (VIAGRA) 100 MG tablet, Take 100 mg by mouth daily as needed for erectile dysfunction., Disp: , Rfl:  .  spironolactone (ALDACTONE) 25 MG tablet, TAKE 1 TABLET BY MOUTH EVERY DAY, Disp: 90 tablet, Rfl: 1 .  torsemide (DEMADEX) 20 MG tablet, Take 20 mg by mouth daily., Disp: , Rfl:  .  triamcinolone  ointment (KENALOG) 0.5 %, Apply 1 Application topically 2 (two) times daily., Disp: 30 g, Rfl: 3   Allergies  Allergen Reactions  . Latex Swelling and Rash     Review of Systems   There were no vitals filed for this visit. There is no height or weight on file to calculate BMI.  The ASCVD Risk score (Arnett DK, et al., 2019) failed to calculate for the following reasons:   The valid HDL cholesterol range is 20 to 100 mg/dL ++ Objective:  Physical Exam      Assessment And Plan:     1. Essential hypertension    No follow-ups on file.  Patient was given opportunity to ask questions. Patient verbalized understanding of the plan and was able to repeat key elements of the plan. All questions were answered to their satisfaction.  Marlyn Corporal, CMA   I, Marlyn Corporal, CMA, have reviewed all documentation for this visit. The documentation on 04/21/23 for the exam, diagnosis, procedures, and orders are all accurate and complete.   IF YOU HAVE BEEN REFERRED TO A SPECIALIST, IT MAY TAKE 1-2 WEEKS TO SCHEDULE/PROCESS THE REFERRAL. IF YOU HAVE NOT HEARD FROM US/SPECIALIST IN TWO WEEKS, PLEASE GIVE [place] A CALL AT [phone].   THE PATIENT  IS ENCOURAGED TO PRACTICE SOCIAL DISTANCING DUE TO THE COVID-19 PANDEMIC.

## 2023-04-21 NOTE — Telephone Encounter (Signed)
Contacted [name] to schedule their annual wellness visit. Call back at later date: 04/21/23 later in day pt asleep right now  Rudell Cobb AWV direct phone # [phone]

## 2023-05-15 ENCOUNTER — Encounter (INDEPENDENT_AMBULATORY_CARE_PROVIDER_SITE_OTHER): Payer: Medicare Other | Admitting: Family Medicine

## 2023-05-15 ENCOUNTER — Encounter: Payer: Self-pay | Admitting: Family Medicine

## 2023-05-15 NOTE — Progress Notes (Unsigned)
I,Jameka J Llittleton,acting as a Neurosurgeon for [employer], NP.,have documented all relevant documentation on the behalf of PAT OHENHEN, NP,as directed by  PAT Moshe Salisbury, NP while in the presence of PAT OHENHEN, NP.  Subjective:  Patient ID: Austin [name] , male    DOB: [birth year] , 67 y.o.   MRN: [id number]  Chief Complaint  Patient presents with   Diarrhea   Vomiting    HPI  Patient presents today for diarrhea and vomiting for the past 3 to 4 days. Patient has tried a few otc meds and nothing is helping. He reports he is unable to eat. He has been able to drink ginger ale. Advised to go to the ER, not able to keep any thing down, frequent vomiting with upto 10 stools per day  Diarrhea  This is a new problem. The current episode started in the past 7 days. The problem occurs 5 to 10 times per day. The problem has been gradually worsening. The stool consistency is described as Watery and bloody. The patient states that diarrhea awakens him from sleep. Associated symptoms include vomiting. Pertinent negatives include no abdominal pain, chills or fever. Nothing aggravates the symptoms. There are no known risk factors. Treatments tried: pepto mismol. The treatment provided no relief.     Past Medical History:  Diagnosis Date   Arthritis    hands   CHF (congestive heart failure) (HCC)    GERD (gastroesophageal reflux disease)    Hypertension    Hypomagnesemia    Hyponatremia    Prostate cancer (HCC)      Family History  Problem Relation Age of Onset   Hypertension Mother    Hypertension Father      Current Outpatient Medications:    carvedilol (COREG) 25 MG tablet, Take 1 tablet (25 mg total) by mouth 2 (two) times daily with a meal., Disp: 180 tablet, Rfl: 3   empagliflozin (JARDIANCE) 10 MG TABS tablet, Take 1 tablet (10 mg total) by mouth daily before breakfast., Disp: 90 tablet, Rfl: 3   ferrous sulfate 325 (65 FE) MG tablet, TAKE 1 TABLET BY MOUTH EVERY DAY, Disp: 90 tablet,  Rfl: 1   Magnesium Oxide 400 MG CAPS, Take 1 capsule (400 mg total) by mouth 2 (two) times daily., Disp: 180 capsule, Rfl: 3   Multiple Vitamin (MULTIVITAMIN) tablet, Take 1 tablet by mouth daily., Disp: , Rfl:    Omega-3 1000 MG CAPS, Take 1,000 mg by mouth daily., Disp: , Rfl:    sacubitril-valsartan (ENTRESTO) 97-103 MG, Take 1 tablet by mouth 2 (two) times daily., Disp: 60 tablet, Rfl: 11   sildenafil (VIAGRA) 100 MG tablet, Take 100 mg by mouth daily as needed for erectile dysfunction., Disp: , Rfl:    spironolactone (ALDACTONE) 25 MG tablet, TAKE 1 TABLET BY MOUTH EVERY DAY, Disp: 90 tablet, Rfl: 1   torsemide (DEMADEX) 20 MG tablet, Take 20 mg by mouth daily., Disp: , Rfl:    triamcinolone ointment (KENALOG) 0.5 %, Apply 1 Application topically 2 (two) times daily., Disp: 30 g, Rfl: 3   Allergies  Allergen Reactions   Latex Swelling and Rash     Review of Systems  Constitutional:  Positive for appetite change. Negative for chills and fever.  Gastrointestinal:  Positive for diarrhea and vomiting. Negative for abdominal pain.     Today's Vitals   05/15/23 1451  BP: 118/70  Pulse: (!) 102  Temp: 97.8 F (36.6 C)  Weight: 153 lb (69.4 kg)  Height: 5'  9" (1.753 m)  PainSc: 0-No pain   Body mass index is 22.59 kg/m.  Wt Readings from Last 3 Encounters:  05/15/23 153 lb (69.4 kg)  03/18/23 154 lb (69.9 kg)  12/18/22 155 lb (70.3 kg)    The ASCVD Risk score (Arnett DK, et al., 2019) failed to calculate for the following reasons:   The valid HDL cholesterol range is 20 to 100 mg/dL  Objective:  Physical Exam      Assessment And Plan:  There are no diagnoses linked to this encounter.   Return if symptoms worsen or fail to improve.  Patient was given opportunity to ask questions. Patient verbalized understanding of the plan and was able to repeat key elements of the plan. All questions were answered to their satisfaction.  PAT Moshe Salisbury, NP  I, PAT Moshe Salisbury, NP, have  reviewed all documentation for this visit. The documentation on 05/15/23 for the exam, diagnosis, procedures, and orders are all accurate and complete.   IF YOU HAVE BEEN REFERRED TO A SPECIALIST, IT MAY TAKE 1-2 WEEKS TO SCHEDULE/PROCESS THE REFERRAL. IF YOU HAVE NOT HEARD FROM US/SPECIALIST IN TWO WEEKS, PLEASE GIVE [place] A CALL AT [phone] X [phone].

## 2023-05-17 ENCOUNTER — Other Ambulatory Visit: Payer: Self-pay

## 2023-05-17 ENCOUNTER — Emergency Department (HOSPITAL_BASED_OUTPATIENT_CLINIC_OR_DEPARTMENT_OTHER): Payer: Medicare Other

## 2023-05-17 ENCOUNTER — Encounter (HOSPITAL_BASED_OUTPATIENT_CLINIC_OR_DEPARTMENT_OTHER): Payer: Self-pay

## 2023-05-17 ENCOUNTER — Ambulatory Visit
Admission: EM | Admit: 2023-05-17 | Discharge: 2023-05-17 | Disposition: A | Discharge status: Other Acute Inpatient Hospital | Payer: Medicare Other

## 2023-05-17 ENCOUNTER — Inpatient Hospital Stay (HOSPITAL_BASED_OUTPATIENT_CLINIC_OR_DEPARTMENT_OTHER)
Admission: EM | Admit: 2023-05-17 | Discharge: 2023-05-20 | DRG: 641 | Disposition: A | Discharge status: Home / Self Care | Payer: Medicare Other | Attending: Internal Medicine | Admitting: Internal Medicine

## 2023-05-17 DIAGNOSIS — R7989 Other specified abnormal findings of blood chemistry: Secondary | ICD-10-CM | POA: Diagnosis not present

## 2023-05-17 DIAGNOSIS — K5730 Diverticulosis of large intestine without perforation or abscess without bleeding: Secondary | ICD-10-CM | POA: Diagnosis not present

## 2023-05-17 DIAGNOSIS — Z8546 Personal history of malignant neoplasm of prostate: Secondary | ICD-10-CM | POA: Diagnosis not present

## 2023-05-17 DIAGNOSIS — Z8249 Family history of ischemic heart disease and other diseases of the circulatory system: Secondary | ICD-10-CM | POA: Diagnosis not present

## 2023-05-17 DIAGNOSIS — E876 Hypokalemia: Secondary | ICD-10-CM | POA: Diagnosis present

## 2023-05-17 DIAGNOSIS — R945 Abnormal results of liver function studies: Secondary | ICD-10-CM | POA: Diagnosis not present

## 2023-05-17 DIAGNOSIS — R630 Anorexia: Secondary | ICD-10-CM

## 2023-05-17 DIAGNOSIS — I110 Hypertensive heart disease with heart failure: Secondary | ICD-10-CM | POA: Diagnosis present

## 2023-05-17 DIAGNOSIS — Z7984 Long term (current) use of oral hypoglycemic drugs: Secondary | ICD-10-CM

## 2023-05-17 DIAGNOSIS — I471 Supraventricular tachycardia, unspecified: Secondary | ICD-10-CM | POA: Diagnosis not present

## 2023-05-17 DIAGNOSIS — R9431 Abnormal electrocardiogram [ECG] [EKG]: Secondary | ICD-10-CM | POA: Diagnosis not present

## 2023-05-17 DIAGNOSIS — R1030 Lower abdominal pain, unspecified: Secondary | ICD-10-CM

## 2023-05-17 DIAGNOSIS — E861 Hypovolemia: Secondary | ICD-10-CM | POA: Diagnosis present

## 2023-05-17 DIAGNOSIS — F1010 Alcohol abuse, uncomplicated: Secondary | ICD-10-CM | POA: Diagnosis present

## 2023-05-17 DIAGNOSIS — I509 Heart failure, unspecified: Secondary | ICD-10-CM | POA: Diagnosis present

## 2023-05-17 DIAGNOSIS — R531 Weakness: Secondary | ICD-10-CM

## 2023-05-17 DIAGNOSIS — N179 Acute kidney failure, unspecified: Secondary | ICD-10-CM | POA: Diagnosis present

## 2023-05-17 DIAGNOSIS — I10 Essential (primary) hypertension: Secondary | ICD-10-CM | POA: Diagnosis not present

## 2023-05-17 DIAGNOSIS — E871 Hypo-osmolality and hyponatremia: Principal | ICD-10-CM | POA: Diagnosis present

## 2023-05-17 DIAGNOSIS — R1110 Vomiting, unspecified: Secondary | ICD-10-CM

## 2023-05-17 DIAGNOSIS — R410 Disorientation, unspecified: Secondary | ICD-10-CM | POA: Diagnosis not present

## 2023-05-17 DIAGNOSIS — Z79899 Other long term (current) drug therapy: Secondary | ICD-10-CM | POA: Diagnosis not present

## 2023-05-17 DIAGNOSIS — R7401 Elevation of levels of liver transaminase levels: Secondary | ICD-10-CM | POA: Diagnosis not present

## 2023-05-17 DIAGNOSIS — R197 Diarrhea, unspecified: Secondary | ICD-10-CM | POA: Diagnosis not present

## 2023-05-17 DIAGNOSIS — K219 Gastro-esophageal reflux disease without esophagitis: Secondary | ICD-10-CM | POA: Diagnosis not present

## 2023-05-17 DIAGNOSIS — J90 Pleural effusion, not elsewhere classified: Secondary | ICD-10-CM | POA: Diagnosis not present

## 2023-05-17 DIAGNOSIS — I428 Other cardiomyopathies: Secondary | ICD-10-CM

## 2023-05-17 DIAGNOSIS — N3289 Other specified disorders of bladder: Secondary | ICD-10-CM | POA: Diagnosis not present

## 2023-05-17 DIAGNOSIS — R112 Nausea with vomiting, unspecified: Secondary | ICD-10-CM

## 2023-05-17 DIAGNOSIS — Z91040 Latex allergy status: Secondary | ICD-10-CM | POA: Diagnosis not present

## 2023-05-17 LAB — URINALYSIS, ROUTINE W REFLEX MICROSCOPIC
Bacteria, UA: NONE SEEN
Bilirubin Urine: NEGATIVE
Glucose, UA: NEGATIVE mg/dL
Hgb urine dipstick: NEGATIVE
Leukocytes,Ua: NEGATIVE
Nitrite: NEGATIVE
Protein, ur: 30 mg/dL — AB
Specific Gravity, Urine: 1.006 (ref 1.005–1.030)
pH: 6 (ref 5.0–8.0)

## 2023-05-17 LAB — NA AND K (SODIUM & POTASSIUM), RAND UR
Potassium Urine: 12 mmol/L
Sodium, Ur: <10 mmol/L

## 2023-05-17 LAB — CBC
HCT: 33 % — ABNORMAL LOW (ref 39.0–52.0)
Hemoglobin: 11.6 g/dL — ABNORMAL LOW (ref 13.0–17.0)
MCH: 31.3 pg (ref 26.0–34.0)
MCHC: 35.2 g/dL (ref 30.0–36.0)
MCV: 88.9 fL (ref 80.0–100.0)
Platelets: 233 10*3/uL (ref 150–400)
RBC: 3.71 MIL/uL — ABNORMAL LOW (ref 4.22–5.81)
RDW: 17.6 % — ABNORMAL HIGH (ref 11.5–15.5)
WBC: 6.6 10*3/uL (ref 4.0–10.5)
nRBC: 0.3 % — ABNORMAL HIGH (ref 0.0–0.2)

## 2023-05-17 LAB — SODIUM: Sodium: 122 mmol/L — ABNORMAL LOW (ref 135–145)

## 2023-05-17 LAB — COMPREHENSIVE METABOLIC PANEL
ALT: 141 U/L — ABNORMAL HIGH (ref 0–44)
AST: 102 U/L — ABNORMAL HIGH (ref 15–41)
Albumin: 4 g/dL (ref 3.5–5.0)
Alkaline Phosphatase: 132 U/L — ABNORMAL HIGH (ref 38–126)
Anion gap: 14 (ref 5–15)
BUN: 9 mg/dL (ref 8–23)
CO2: 20 mmol/L — ABNORMAL LOW (ref 22–32)
Calcium: 8.7 mg/dL — ABNORMAL LOW (ref 8.9–10.3)
Chloride: 84 mmol/L — ABNORMAL LOW (ref 98–111)
Creatinine, Ser: 0.83 mg/dL (ref 0.61–1.24)
GFR, Estimated: >60 mL/min (ref >60)
Glucose, Bld: 81 mg/dL (ref 70–99)
Potassium: 3.9 mmol/L (ref 3.5–5.1)
Sodium: 118 mmol/L — CL (ref 135–145)
Total Bilirubin: 3.2 mg/dL — ABNORMAL HIGH (ref 0.3–1.2)
Total Protein: 7.5 g/dL (ref 6.5–8.1)

## 2023-05-17 LAB — ETHANOL: Alcohol, Ethyl (B): 38 mg/dL — ABNORMAL HIGH (ref <10)

## 2023-05-17 LAB — BRAIN NATRIURETIC PEPTIDE: B Natriuretic Peptide: 3511.3 pg/mL — ABNORMAL HIGH (ref 0.0–100.0)

## 2023-05-17 LAB — OSMOLALITY: Osmolality: 265 mOsm/kg — ABNORMAL LOW (ref 275–295)

## 2023-05-17 LAB — PROTIME-INR
INR: 1.1 (ref 0.8–1.2)
Prothrombin Time: 14.3 seconds (ref 11.4–15.2)

## 2023-05-17 LAB — LIPASE, BLOOD: Lipase: 72 U/L — ABNORMAL HIGH (ref 11–51)

## 2023-05-17 LAB — MAGNESIUM: Magnesium: 1.7 mg/dL (ref 1.7–2.4)

## 2023-05-17 LAB — OSMOLALITY, URINE: Osmolality, Ur: 195 mOsm/kg — ABNORMAL LOW (ref 300–900)

## 2023-05-17 MED ORDER — IOHEXOL 300 MG/ML  SOLN
100.0000 mL | Freq: Once | INTRAMUSCULAR | Status: AC | PRN
  Administered 2023-05-17: 80 mL via INTRAVENOUS

## 2023-05-17 MED ORDER — LORAZEPAM 1 MG PO TABS: 1.0000 mg | ORAL_TABLET | ORAL | Status: AC | PRN

## 2023-05-17 MED ORDER — ONDANSETRON HCL 4 MG/2ML IJ SOLN
4.0000 mg | Freq: Once | INTRAMUSCULAR | Status: AC
  Administered 2023-05-17: 4 mg via INTRAVENOUS
  Filled 2023-05-17: qty 2

## 2023-05-17 MED ORDER — SODIUM CHLORIDE 0.9 % IV BOLUS (SEPSIS)
750.0000 mL | Freq: Once | INTRAVENOUS | Status: AC
  Administered 2023-05-17: 750 mL via INTRAVENOUS

## 2023-05-17 MED ORDER — THIAMINE HCL 100 MG/ML IJ SOLN
100.0000 mg | Freq: Once | INTRAMUSCULAR | Status: AC
  Administered 2023-05-17: 100 mg via INTRAVENOUS
  Filled 2023-05-17: qty 2

## 2023-05-17 MED ORDER — FOLIC ACID 1 MG PO TABS
1.0000 mg | ORAL_TABLET | Freq: Once | ORAL | Status: AC
  Administered 2023-05-17: 1 mg via ORAL
  Filled 2023-05-17: qty 1

## 2023-05-17 MED ORDER — SODIUM CHLORIDE 0.9 % IV SOLN: INTRAVENOUS | Status: DC

## 2023-05-17 MED ORDER — SODIUM CHLORIDE 0.9 % IV BOLUS (SEPSIS)
1000.0000 mL | Freq: Once | INTRAVENOUS | Status: DC
  Administered 2023-05-17: 1000 mL via INTRAVENOUS

## 2023-05-17 MED ORDER — SODIUM CHLORIDE 0.9 % IV BOLUS (SEPSIS): 500.0000 mL | Freq: Once | INTRAVENOUS | Status: DC

## 2023-05-17 MED ORDER — LORAZEPAM 2 MG/ML IJ SOLN
1.0000 mg | INTRAMUSCULAR | Status: AC | PRN
  Administered 2023-05-20: 1 mg via INTRAVENOUS
  Filled 2023-05-17: qty 1

## 2023-05-17 MED ORDER — SODIUM CHLORIDE 0.9 % IV BOLUS: 500.0000 mL | Freq: Once | INTRAVENOUS | Status: DC

## 2023-05-17 MED ORDER — PANTOPRAZOLE SODIUM 40 MG IV SOLR
40.0000 mg | Freq: Once | INTRAVENOUS | Status: AC
  Administered 2023-05-17: 40 mg via INTRAVENOUS
  Filled 2023-05-17: qty 10

## 2023-05-17 NOTE — ED Notes (Signed)
Austin [name] with [facility] called for consult

## 2023-05-17 NOTE — ED Provider Notes (Signed)
[facility]    CSN: [id number] Arrival date & time: 05/17/23  0944      History   Chief Complaint No chief complaint on file.   HPI Austin [name] is a 67 y.o. male.   HPI Patient has a medical history significant for EtOH abuse, essential hypertension, cardiomyopathy, CHF and iron deficiency, presents today for evaluation of 6 days of loss of appetite, generalized weakness and feeling unsteady on his feet, vomiting cough anytime he attempts to eat, generalized abdominal pain.  He reports the symptoms have been ongoing for 5 days now today he is having some mild shortness of breath, cough.  He reports that he has been vomiting where he attempts to eat and it is mixed with mucus.  Overall illness began with diarrhea and loose stools have persisted for more than one week. Patient has taken several over-the-counter medications to try to alleviate symptoms and he reports overall he feels worse. Patient also of note has a history of hyponatremia and hypomagnesia.  Last serum sodium level was 131 with a chloride level of 97.  He reports that he has been intolerant of food for at minimum of 5 days. Past Medical History:  Diagnosis Date   Arthritis    hands   CHF (congestive heart failure) (HCC)    GERD (gastroesophageal reflux disease)    Hypertension    Hypomagnesemia    Hyponatremia    Prostate cancer [facility])     Patient Active Problem List   Diagnosis Date Noted   Iron deficiency anemia 09/03/2020   Acute on chronic combined systolic (congestive) and diastolic (congestive) heart failure (HCC) 03/26/2020   NICM (nonischemic cardiomyopathy) (HCC)    ETOH abuse 11/10/2018   Essential hypertension 11/10/2018   Blood in stool 11/10/2018    Past Surgical History:  Procedure Laterality Date   LYMPHADENECTOMY Bilateral 01/26/2014   Procedure: LYMPHADENECTOMY WITH INDOCYANINE GREEN DYE;  Surgeon: Sebastian Ache, MD;  Location: [facility];  Service: Urology;  Laterality:  Bilateral;   RIGHT/LEFT HEART CATH AND CORONARY ANGIOGRAPHY N/A 02/25/2020   Procedure: RIGHT/LEFT HEART CATH AND CORONARY ANGIOGRAPHY;  Surgeon: Kathleene Hazel, MD;  Location: [facility] INVASIVE CV [facility];  Service: Cardiovascular;  Laterality: N/A;   ROBOT ASSISTED LAPAROSCOPIC RADICAL PROSTATECTOMY N/A 01/26/2014   Procedure: ROBOTIC ASSISTED LAPAROSCOPIC RADICAL PROSTATECTOMY;  Surgeon: Sebastian Ache, MD;  Location: [facility];  Service: Urology;  Laterality: N/A;   SHOULDER SURGERY Left    TEE WITHOUT CARDIOVERSION N/A 04/15/2022   Procedure: TRANSESOPHAGEAL ECHOCARDIOGRAM (TEE);  Surgeon: Laurey Morale, MD;  Location: [facility] ENDOSCOPY;  Service: Cardiovascular;  Laterality: N/A;       Home Medications    Prior to Admission medications   Medication Sig Start Date End Date Taking? Authorizing Provider  carvedilol (COREG) 25 MG tablet Take 1 tablet (25 mg total) by mouth 2 (two) times daily with a meal. 09/11/21   Laurey Morale, MD  empagliflozin (JARDIANCE) 10 MG TABS tablet Take 1 tablet (10 mg total) by mouth daily before breakfast. 02/22/22   Laurey Morale, MD  ferrous sulfate 325 (65 FE) MG tablet TAKE 1 TABLET BY MOUTH EVERY DAY 09/14/20   [name], FNP  Magnesium Oxide 400 MG CAPS Take 1 capsule (400 mg total) by mouth 2 (two) times daily. 03/04/20   Little Ishikawa, MD  Multiple Vitamin (MULTIVITAMIN) tablet Take 1 tablet by mouth daily.    [provider]  Omega-3 1000 MG CAPS Take 1,000 mg by  mouth daily.    [provider]  sacubitril-valsartan (ENTRESTO) 97-103 MG Take 1 tablet by mouth 2 (two) times daily. 07/22/22   Laurey Morale, MD  sildenafil (VIAGRA) 100 MG tablet Take 100 mg by mouth daily as needed for erectile dysfunction. 01/07/22   [provider]  spironolactone (ALDACTONE) 25 MG tablet TAKE 1 TABLET BY MOUTH EVERY DAY 10/11/20   Robbie Lis M, PA-C  torsemide (DEMADEX) 20 MG tablet Take 20 mg by mouth daily.    [provider]  triamcinolone ointment (KENALOG) 0.5 % Apply 1 Application topically 2 (two) times daily. 12/18/22   [name], FNP    Family History Family History  Problem Relation Age of Onset   Hypertension Mother    Hypertension Father     Social History Social History   Tobacco Use   Smoking status: Never   Smokeless tobacco: Never  Vaping Use   Vaping Use: Never used  Substance Use Topics   Alcohol use: Yes    Comment: 1-3 beers daily   Drug use: No     Allergies   Latex   Review of Systems Review of Systems Pertinent negatives listed in HPI   Physical Exam Triage Vital Signs ED Triage Vitals  Enc Vitals Group     BP 05/17/23 1050 (!) 132/94     Pulse Rate 05/17/23 1050 94     Resp 05/17/23 1050 18     Temp 05/17/23 1050 97.6 F (36.4 C)     Temp Source 05/17/23 1050 Oral     SpO2 05/17/23 1050 97 %     Weight --      Height --      Head Circumference --      Peak Flow --      Pain Score 05/17/23 1051 0     Pain Loc --      Pain Edu? --      Excl. in GC? --      Updated Vital Signs BP (!) 132/94 (BP Location: Left Arm)   Pulse 94   Temp 97.6 F (36.4 C) (Oral)   Resp 18   SpO2 97%   Visual Acuity Right Eye Distance:   Left Eye Distance:   Bilateral Distance:    Right Eye Near:   Left Eye Near:    Bilateral Near:     Physical Exam Constitutional:      Appearance: He is ill-appearing.  HENT:     Head: Normocephalic and atraumatic.     Nose: Nose normal.  Eyes:     General:        Right eye: Discharge present.        Left eye: Discharge present.    Extraocular Movements: Extraocular movements intact.     Pupils: Pupils are equal, round, and reactive to light.  Cardiovascular:     Rate and Rhythm: Normal rate and regular rhythm.  Pulmonary:     Breath sounds: Decreased air movement present.  Abdominal:     Tenderness: There is abdominal tenderness. There is no right CVA tenderness, left CVA tenderness, guarding or  rebound.     Comments: Lower abdominal tenderness radiating into the umbilicus  Musculoskeletal:     Cervical back: Normal range of motion and neck supple.  Skin:    General: Skin is warm and dry.     Capillary Refill: Capillary refill takes less than 2 seconds.  Neurological:     Motor: Weakness present.  UC Treatments / Results  Labs (all labs ordered are listed, but only abnormal results are displayed) Labs Reviewed - No data to display  EKG   Radiology No results found.  Procedures Procedures (including critical care time)  Medications Ordered in UC Medications - No data to display  Initial Impression / Assessment and Plan / UC Course  I have reviewed the triage vital signs and the nursing notes.  Pertinent labs & imaging results that were available during my care of the patient were reviewed by me and considered in my medical decision making (see chart for details).    Given limitations of urgent care and patient's symptoms persisting for greater than 5 days to include abdominal pain with active vomitus warrants further workup and evaluation in the setting of the emergency department including advanced imaging to rule out an acute abdominal process.  Given options to go to the ED for ops to go to a stand-alone emergency department.  Patient was extensively advised that if he has a condition that warrants admission into the hospital that he will transferred from the stand-alone ED to one of our hospitals.  Patient verbalized understanding and agreement plan Final Clinical Impressions(s) / UC Diagnoses   Final diagnoses:  Lower abdominal pain  Nausea and vomiting, unspecified vomiting type x 5 days  Weakness generalized  Poor appetite     Discharge Instructions      Go immediately to the emergency department listed above at minimum you need to have advanced imaging done of your stomach to determine you have been unable to eat and have had vomiting over the  last 5 days.  To limitations of urgent care or unable to appropriately work you up here in this setting.     ED Prescriptions   None    PDMP not reviewed this encounter.   Bing Neighbors, NP 05/17/23 [phone]

## 2023-05-17 NOTE — ED Triage Notes (Addendum)
Pt reports after church he began to feel sick x 6 days. Loss of appetite, unsteady on his feet, cough especially when he eats he vomits it back up, and weakness. Pt states he also have eye discharge and in the morning he has to pop his eyes open because they are "matted" Theraflu, white stomachache relief to get relief but only slight relief.

## 2023-05-17 NOTE — ED Triage Notes (Signed)
He c/o anorexia and occasional n/v x 1 week. He denies abd. Pain. He was seen at Urgent care today and they recommended he come here "to be checked". He is ambulatory and in no distress.

## 2023-05-17 NOTE — ED Provider Notes (Signed)
Austin [name] EMERGENCY DEPARTMENT AT [facility] Provider Note   CSN: [id number] Arrival date & time: 05/17/23  1203     History  Chief Complaint  Patient presents with   Emesis    Austin [name] is a 67 y.o. male.  With PMH of EtOH use,  CHF, GERD, prostate cancer who presents with decreased appetite and anorexia with nausea and vomiting x 1 week.  Patient has not drink for the whole past week because he has been feeling unwell.  However he does endorse a few beers here and there.  He does not endorse any significant abdominal pain but endorses ongoing vomiting and diarrhea for the past week.  He denies any blood in vomit or diarrhea.  He did note some darker stools but feels like that is related to the Kaopectate that he has been taking.  He denies any symptoms of withdrawal from alcohol.  He has had no chest pain or shortness of breath.  Has had some intermittent dry cough.  Denies any leg pain or swelling.  Endorses weight loss with all the vomiting and diarrhea.  Endorses up to 3 episodes of both the day.  No recent travel or antibiotic use. No confusion reported he takes [place].    Emesis      Home Medications Prior to Admission medications   Medication Sig Start Date End Date Taking? Authorizing Provider  carvedilol (COREG) 25 MG tablet Take 1 tablet (25 mg total) by mouth 2 (two) times daily with a meal. 09/11/21   Laurey Morale, MD  empagliflozin (JARDIANCE) 10 MG TABS tablet Take 1 tablet (10 mg total) by mouth daily before breakfast. 02/22/22   Laurey Morale, MD  ferrous sulfate 325 (65 FE) MG tablet TAKE 1 TABLET BY MOUTH EVERY DAY 09/14/20   [name], FNP  Magnesium Oxide 400 MG CAPS Take 1 capsule (400 mg total) by mouth 2 (two) times daily. 03/04/20   Little Ishikawa, MD  Multiple Vitamin (MULTIVITAMIN) tablet Take 1 tablet by mouth daily.    [provider]  Omega-3 1000 MG CAPS Take 1,000 mg by mouth daily.    [provider]  sacubitril-valsartan (ENTRESTO) 97-103 MG Take 1 tablet by mouth 2 (two) times daily. 07/22/22   Laurey Morale, MD  sildenafil (VIAGRA) 100 MG tablet Take 100 mg by mouth daily as needed for erectile dysfunction. 01/07/22   [provider]  spironolactone (ALDACTONE) 25 MG tablet TAKE 1 TABLET BY MOUTH EVERY DAY 10/11/20   Robbie Lis M, PA-C  torsemide (DEMADEX) 20 MG tablet Take 20 mg by mouth daily.    [provider]  triamcinolone ointment (KENALOG) 0.5 % Apply 1 Application topically 2 (two) times daily. 12/18/22   [name], FNP      Allergies    Latex    Review of Systems   Review of Systems  Gastrointestinal:  Positive for vomiting.    Physical Exam Updated Vital Signs BP (!) 136/99   Pulse 91   Temp 98.3 F (36.8 C) (Oral)   Resp (!) 23   Ht 5\' 9"  (1.753 m)   Wt 68 kg   SpO2 97%   BMI 22.15 kg/m  Physical Exam Constitutional: Alert and oriented.  Chronically ill-appearing but no acute distress, GCS 15 Eyes: Mild scleral icterus ENT      Head: Normocephalic and atraumatic.      Mouth/Throat: Mucous membranes are dry. Cardiovascular: Regular rate Respiratory: Normal respiratory  effort. Breath sounds are normal.  O2 sat 100 on RA Gastrointestinal: Thin and nondistended.  Generalized mild tenderness. no rebound or guarding. Musculoskeletal: Normal range of motion in all extremities. No pitting edema of lower extremities Neurologic: Normal speech and language.  GCS 15.  AOx4.  No gross focal neurologic deficits are appreciated. Skin: Skin is warm, dry and intact. No rash noted. Psychiatric: Mood and affect are normal. Speech and behavior are normal.  ED Results / Procedures / Treatments   Labs (all labs ordered are listed, but only abnormal results are displayed) Labs Reviewed  LIPASE, BLOOD - Abnormal; Notable for the following components:      Result Value   Lipase 72 (*)    All other components within normal limits   COMPREHENSIVE METABOLIC PANEL - Abnormal; Notable for the following components:   Sodium 118 (*)    Chloride 84 (*)    CO2 20 (*)    Calcium 8.7 (*)    AST 102 (*)    ALT 141 (*)    Alkaline Phosphatase 132 (*)    Total Bilirubin 3.2 (*)    All other components within normal limits  CBC - Abnormal; Notable for the following components:   RBC 3.71 (*)    Hemoglobin 11.6 (*)    HCT 33.0 (*)    RDW 17.6 (*)    nRBC 0.3 (*)    All other components within normal limits  URINALYSIS, ROUTINE W REFLEX MICROSCOPIC - Abnormal; Notable for the following components:   Ketones, ur TRACE (*)    Protein, ur 30 (*)    All other components within normal limits  ETHANOL - Abnormal; Notable for the following components:   Alcohol, Ethyl (B) 38 (*)    All other components within normal limits  BRAIN NATRIURETIC PEPTIDE - Abnormal; Notable for the following components:   B Natriuretic Peptide 3,511.3 (*)    All other components within normal limits  MAGNESIUM  PROTIME-INR  NA AND K (SODIUM & POTASSIUM), RAND UR  OSMOLALITY, URINE  OSMOLALITY  SODIUM  SODIUM  SODIUM  SODIUM    EKG None  Radiology DG Chest Portable 1 View  Result Date: 05/17/2023 CLINICAL DATA:  Nausea and vomiting for 1 week EXAM: PORTABLE CHEST 1 VIEW COMPARISON:  03/26/2020 FINDINGS: Moderate right pleural effusion and suspected trace left pleural effusion. Mild to moderate cardiomegaly. Subsegmental atelectasis or scarring at the left lung base. Old healed left clavicular fracture. IMPRESSION: 1. Moderate right pleural effusion and suspected trace left pleural effusion. 2. Mild to moderate cardiomegaly. 3. Old healed left clavicular fracture. Electronically Signed   By: Gaylyn Rong M.D.   On: 05/17/2023 15:39   CT ABDOMEN PELVIS W CONTRAST  Result Date: 05/17/2023 CLINICAL DATA:  Vomiting and diarrhea.  Transaminitis. EXAM: CT ABDOMEN AND PELVIS WITH CONTRAST TECHNIQUE: Multidetector CT imaging of the abdomen and  pelvis was performed using the standard protocol following bolus administration of intravenous contrast. RADIATION DOSE REDUCTION: This exam was performed according to the departmental dose-optimization program which includes automated exposure control, adjustment of the mA and/or kV according to patient size and/or use of iterative reconstruction technique. CONTRAST:  80mL OMNIPAQUE IOHEXOL 300 MG/ML  SOLN COMPARISON:  None Available. FINDINGS: Lower chest: Chest there is collapse/consolidative disease in the right lower lobe with minimal subsegmental atelectasis at the left base. Tiny left and moderate right pleural effusions. Hepatobiliary: Heterogeneous liver parenchyma likely secondary to geographic fatty deposition. No suspicious focal abnormality within the liver parenchyma.  There is no evidence for gallstones, gallbladder wall thickening, or pericholecystic fluid. No intrahepatic or extrahepatic biliary dilation. Pancreas: No focal mass lesion. No dilatation of the main duct. No intraparenchymal cyst. No peripancreatic edema. Spleen: No splenomegaly. No focal mass lesion. Adrenals/Urinary Tract: No adrenal nodule or mass. Kidneys unremarkable. No evidence for hydroureter. Mild circumferential bladder wall thickening evident. Stomach/Bowel: Stomach is unremarkable. No gastric wall thickening. No evidence of outlet obstruction. Duodenum is normally positioned as is the ligament of Treitz. No small bowel wall thickening. No small bowel dilatation. The appendix is normal. No gross colonic mass. No colonic wall thickening. Diverticular changes are noted in the left colon without evidence of diverticulitis. Vascular/Lymphatic: No abdominal aortic aneurysm. No abdominal aortic atherosclerotic calcification. There is no gastrohepatic or hepatoduodenal ligament lymphadenopathy. No retroperitoneal or mesenteric lymphadenopathy. No pelvic sidewall lymphadenopathy. Reproductive: The prostate gland and seminal vesicles  are unremarkable. Other: Small volume free fluid noted around the liver and in the right paracolic gutter. Small volume free fluid seen in the pelvis. Musculoskeletal: No worrisome lytic or sclerotic osseous abnormality. IMPRESSION: 1. Small volume free fluid in the abdomen and pelvis. Etiology uncertain. 2. Collapse/consolidative disease in the right lower lobe with tiny left and moderate right pleural effusions. 3. Heterogeneous liver parenchyma likely secondary to geographic fatty deposition. 4. Mild circumferential bladder wall thickening. Correlation with urinalysis recommended to exclude cystitis. 5. Left colonic diverticulosis without diverticulitis. Electronically Signed   By: Kennith Center M.D.   On: 05/17/2023 15:24    Procedures .Critical Care  Performed by: Austin Sayer, MD Authorized by: Austin Sayer, MD   Critical care provider statement:    Critical care time (minutes):  60   Critical care was necessary to treat or prevent imminent or life-threatening deterioration of the following conditions:  Cardiac failure and metabolic crisis   Critical care was time spent personally by me on the following activities:  Development of treatment plan with patient or surrogate, discussions with consultants, evaluation of patient's response to treatment, examination of patient, ordering and review of laboratory studies, ordering and review of radiographic studies, ordering and performing treatments and interventions, pulse oximetry, re-evaluation of patient's condition, review of old charts and obtaining history from patient or surrogate   Care discussed with: admitting provider       Medications Ordered in ED Medications  LORazepam (ATIVAN) tablet 1-4 mg (has no administration in time range)    Or  LORazepam (ATIVAN) injection 1-4 mg (has no administration in time range)  sodium chloride 0.9 % bolus 750 mL (750 mLs Intravenous New Bag/Given 05/17/23 1605)  ondansetron (ZOFRAN)  injection 4 mg (4 mg Intravenous Given 05/17/23 1601)  pantoprazole (PROTONIX) injection 40 mg (40 mg Intravenous Given 05/17/23 1602)  iohexol (OMNIPAQUE) 300 MG/ML solution 100 mL (80 mLs Intravenous Contrast Given 05/17/23 1442)  thiamine (VITAMIN B1) injection 100 mg (100 mg Intravenous Given 05/17/23 1602)  folic acid (FOLVITE) tablet 1 mg (1 mg Oral Given 05/17/23 1601)    ED Course/ Medical Decision Making/ A&P Clinical Course as of 05/17/23 1626  Sat May 17, 2023  1555 Creatinine: 0.83 [VB]  1609 S/w Dr Arlean Hopping of nephrology recommending 75 cc an hour over prolonged period of time 10 to 12 hours and repeat sodium checks every 4 hours.  If improving, leave on infusion, if worsening, discontinue infusion and start on hypertonic saline.  I have discussed with Dr. Sherryll Burger for admission to stepdown to also involve heart failure team. [VB]  Clinical Course User Index [VB] Austin Sayer, MD                             Medical Decision Making Austin [name] is a 67 y.o. male.  With PMH of EtOH use,  CHF, GERD, prostate cancer who presents with decreased appetite and anorexia with nausea and vomiting x 1 week.   Patient awake alert oriented x 4, chronically ill in appearance mildly hypovolemic on exam.  Patient has no abdominal discomfort, suspect his symptoms are more likely from possible viral gastroenteritis however obtain CTAP with contrast which I personally reviewed to ensure no evidence of SBO or other acute pathology.  There were no acute findings other than small volume free fluid in abdomen pelvis.  Could be related to underlying heart failure or cirrhosis.  He also had additional bilateral pleural effusions although no hypoxia or respiratory distress.  His labs were severely concerning for acute on chronic hyponatremia sodium was 118.  No active seizing or altered mental status.  Also associated transaminitis AST 102 ALT 141 alk phos 132 and T. bili 3.2 likely due to chronic drinking.   Hemoglobin is 11.6 generally consistent with baseline, doubt active GI bleed.  He has no ascites suggestive of SBP.  Notable BNP of 3511.  S/w Dr Arlean Hopping of nephrology recommending 75 cc normal saline an hour over prolonged period of time 10 to 12 hours and repeat sodium checks every 4 hours.  If improving, leave on infusion, if worsening, discontinue infusion and start on hypertonic saline.  I have discussed with Dr. Sherryll Burger for admission to stepdown to also involve heart failure team. [VB]  Amount and/or Complexity of Data Reviewed Labs: ordered. Decision-making details documented in ED Course. Radiology: ordered.  Risk Prescription drug management. Decision regarding hospitalization.    Final Clinical Impression(s) / ED Diagnoses Final diagnoses:  Hyponatremia  Transaminitis  Vomiting and diarrhea  Pleural effusion    Rx / DC Orders ED Discharge Orders     None         Austin Sayer, MD 05/17/23 1626

## 2023-05-17 NOTE — ED Notes (Signed)
Patient report attempted to Asher Muir, RN. RN states she will call this nurse back.

## 2023-05-17 NOTE — Discharge Instructions (Signed)
Go immediately to the emergency department listed above at minimum you need to have advanced imaging done of your stomach to determine you have been unable to eat and have had vomiting over the last 5 days.  To limitations of urgent care or unable to appropriately work you up here in this setting.

## 2023-05-17 NOTE — ED Notes (Signed)
Austin [name] with cl called for transport

## 2023-05-17 NOTE — ED Notes (Signed)
Patient is being discharged from the Urgent Care and sent to the Emergency Department via POV . Per provider, patient is in need of higher level of care due to emesis x 5 days with low sodium levels. Patient is aware and verbalizes understanding of plan of care.  Vitals:   05/17/23 1050  BP: (!) 132/94  Pulse: 94  Resp: 18  Temp: 97.6 F (36.4 C)  SpO2: 97%

## 2023-05-17 NOTE — ED Triage Notes (Signed)
CRITICAL VALUE STICKER  CRITICAL VALUE:Na+ 118  RECEIVER (on-site recipient of call):Carmon Ginsberg, RN  DATE & TIME NOTIFIED:   MESSENGER (representative from lab):  MD NOTIFIED: Dr. Elpidio Anis  TIME OF NOTIFICATION:1348  RESPONSE:

## 2023-05-18 ENCOUNTER — Inpatient Hospital Stay (HOSPITAL_COMMUNITY): Payer: Medicare Other

## 2023-05-18 DIAGNOSIS — E871 Hypo-osmolality and hyponatremia: Secondary | ICD-10-CM | POA: Diagnosis not present

## 2023-05-18 DIAGNOSIS — R7989 Other specified abnormal findings of blood chemistry: Secondary | ICD-10-CM | POA: Diagnosis present

## 2023-05-18 DIAGNOSIS — R9431 Abnormal electrocardiogram [ECG] [EKG]: Secondary | ICD-10-CM | POA: Diagnosis present

## 2023-05-18 LAB — CBC
HCT: 31.4 % — ABNORMAL LOW (ref 39.0–52.0)
Hemoglobin: 10.8 g/dL — ABNORMAL LOW (ref 13.0–17.0)
MCH: 30.6 pg (ref 26.0–34.0)
MCHC: 34.4 g/dL (ref 30.0–36.0)
MCV: 89 fL (ref 80.0–100.0)
Platelets: 218 10*3/uL (ref 150–400)
RBC: 3.53 MIL/uL — ABNORMAL LOW (ref 4.22–5.81)
RDW: 18 % — ABNORMAL HIGH (ref 11.5–15.5)
WBC: 5.5 10*3/uL (ref 4.0–10.5)
nRBC: 0 % (ref 0.0–0.2)

## 2023-05-18 LAB — COMPREHENSIVE METABOLIC PANEL
ALT: 124 U/L — ABNORMAL HIGH (ref 0–44)
AST: 98 U/L — ABNORMAL HIGH (ref 15–41)
Albumin: 3.1 g/dL — ABNORMAL LOW (ref 3.5–5.0)
Alkaline Phosphatase: 110 U/L (ref 38–126)
Anion gap: 15 (ref 5–15)
BUN: 7 mg/dL — ABNORMAL LOW (ref 8–23)
CO2: 18 mmol/L — ABNORMAL LOW (ref 22–32)
Calcium: 8.4 mg/dL — ABNORMAL LOW (ref 8.9–10.3)
Chloride: 93 mmol/L — ABNORMAL LOW (ref 98–111)
Creatinine, Ser: 0.94 mg/dL (ref 0.61–1.24)
GFR, Estimated: >60 mL/min (ref >60)
Glucose, Bld: 84 mg/dL (ref 70–99)
Potassium: 3.8 mmol/L (ref 3.5–5.1)
Sodium: 126 mmol/L — ABNORMAL LOW (ref 135–145)
Total Bilirubin: 2.3 mg/dL — ABNORMAL HIGH (ref 0.3–1.2)
Total Protein: 6.5 g/dL (ref 6.5–8.1)

## 2023-05-18 LAB — SODIUM
Sodium: 124 mmol/L — ABNORMAL LOW (ref 135–145)
Sodium: 128 mmol/L — ABNORMAL LOW (ref 135–145)
Sodium: 129 mmol/L — ABNORMAL LOW (ref 135–145)

## 2023-05-18 LAB — HEPATITIS PANEL, ACUTE
HCV Ab: NONREACTIVE
Hep A IgM: NONREACTIVE
Hep B C IgM: NONREACTIVE
Hepatitis B Surface Ag: NONREACTIVE

## 2023-05-18 LAB — HIV ANTIBODY (ROUTINE TESTING W REFLEX): HIV Screen 4th Generation wRfx: NONREACTIVE

## 2023-05-18 LAB — PROTIME-INR
INR: 1.2 (ref 0.8–1.2)
Prothrombin Time: 15.5 seconds — ABNORMAL HIGH (ref 11.4–15.2)

## 2023-05-18 MED ORDER — ENOXAPARIN SODIUM 40 MG/0.4ML IJ SOSY
40.0000 mg | PREFILLED_SYRINGE | INTRAMUSCULAR | Status: DC
  Administered 2023-05-18 – 2023-05-20 (×3): 40 mg via SUBCUTANEOUS
  Filled 2023-05-18 (×3): qty 0.4

## 2023-05-18 MED ORDER — MAGNESIUM SULFATE 2 GM/50ML IV SOLN
2.0000 g | Freq: Once | INTRAVENOUS | Status: AC
  Administered 2023-05-18: 2 g via INTRAVENOUS
  Filled 2023-05-18: qty 50

## 2023-05-18 MED ORDER — OXYCODONE HCL 5 MG PO TABS: 5.0000 mg | ORAL_TABLET | ORAL | Status: DC | PRN

## 2023-05-18 MED ORDER — ACETAMINOPHEN 650 MG RE SUPP: 650.0000 mg | Freq: Four times a day (QID) | RECTAL | Status: DC | PRN

## 2023-05-18 MED ORDER — SODIUM CHLORIDE 0.9 % IV SOLN: INTRAVENOUS | Status: DC

## 2023-05-18 MED ORDER — POTASSIUM CHLORIDE CRYS ER 10 MEQ PO TBCR
10.0000 meq | EXTENDED_RELEASE_TABLET | Freq: Once | ORAL | Status: AC
  Administered 2023-05-18: 10 meq via ORAL
  Filled 2023-05-18: qty 1

## 2023-05-18 MED ORDER — SENNOSIDES-DOCUSATE SODIUM 8.6-50 MG PO TABS: 1.0000 | ORAL_TABLET | Freq: Every evening | ORAL | Status: DC | PRN

## 2023-05-18 MED ORDER — SODIUM CHLORIDE 0.9% FLUSH
3.0000 mL | Freq: Two times a day (BID) | INTRAVENOUS | Status: DC
  Administered 2023-05-18 – 2023-05-19 (×4): 3 mL via INTRAVENOUS

## 2023-05-18 MED ORDER — ACETAMINOPHEN 325 MG PO TABS: 650.0000 mg | ORAL_TABLET | Freq: Four times a day (QID) | ORAL | Status: DC | PRN

## 2023-05-18 NOTE — Consult Note (Signed)
Renal Service Consult Note [facility]  Austin [name] 05/18/2023 [name], MD Requesting Physician: Dr. Dartha Lodge  Reason for Consult: Hyponatremia HPI: The patient is a 67 y.o. year-old w/ PMH as below who presented to ED w/ c/o gen'd weakness, unsteady on his feet, loss of appetite. 6 days ago after church he developed stomach issues w/ N/V and diarrhea. Vomiting up everything he tries to eat. Beer drinker, 3 per day usually. In ED pt afeb, normal VS. Labs showed Na 118, ethanol 38, tbili 3.2, AST 102, ALT 141. Pt has hx of sig CHF f/b CHF team (Dr Shirlee Latch). Pt was admitted. We are asked to see for hyponatremia.   Pt seen in room. Pt got IVF's w/ NS 0.9% at my recommendation given GI hx and his CHF meds were all held (torsemide, aldactone, entresto). Na+ improved to 122 -> 124 > and 126 this am. He rec'd 1750 NS bolus and NS IVF's at 75 /hr for 10 hr overnight.  Pt feels a bit better today. Pt's wife states he had this similar problem when he was dx'd with CHF, he will have a coughing spell which then triggers nausea and vomiting. He has had diarrhea this past week as well. Denies hx of renal failure.    ROS - denies CP, no joint pain, no HA, no blurry vision, no rash, no dysuria, no difficulty voiding   Past Medical History  Past Medical History:  Diagnosis Date   Arthritis    hands   CHF (congestive heart failure) (HCC)    GERD (gastroesophageal reflux disease)    Hypertension    Hypomagnesemia    Hyponatremia    Prostate cancer [facility])    Past Surgical History  Past Surgical History:  Procedure Laterality Date   LYMPHADENECTOMY Bilateral 01/26/2014   Procedure: LYMPHADENECTOMY WITH INDOCYANINE GREEN DYE;  Surgeon: Sebastian Ache, MD;  Location: [facility] ORS;  Service: Urology;  Laterality: Bilateral;   RIGHT/LEFT HEART CATH AND CORONARY ANGIOGRAPHY N/A 02/25/2020   Procedure: RIGHT/LEFT HEART CATH AND CORONARY ANGIOGRAPHY;  Surgeon: Kathleene Hazel, MD;   Location: [facility] INVASIVE CV LAB;  Service: Cardiovascular;  Laterality: N/A;   ROBOT ASSISTED LAPAROSCOPIC RADICAL PROSTATECTOMY N/A 01/26/2014   Procedure: ROBOTIC ASSISTED LAPAROSCOPIC RADICAL PROSTATECTOMY;  Surgeon: Sebastian Ache, MD;  Location: [facility] ORS;  Service: Urology;  Laterality: N/A;   SHOULDER SURGERY Left    TEE WITHOUT CARDIOVERSION N/A 04/15/2022   Procedure: TRANSESOPHAGEAL ECHOCARDIOGRAM (TEE);  Surgeon: Laurey Morale, MD;  Location: [facility];  Service: Cardiovascular;  Laterality: N/A;   Family History  Family History  Problem Relation Age of Onset   Hypertension Mother    Hypertension Father    Social History  reports that he has never smoked. He has never used smokeless tobacco. He reports current alcohol use. He reports that he does not use drugs. Allergies  Allergies  Allergen Reactions   Latex Swelling and Rash   Home medications Prior to Admission medications   Medication Sig Start Date End Date Taking? Authorizing Provider  carvedilol (COREG) 25 MG tablet Take 1 tablet (25 mg total) by mouth 2 (two) times daily with a meal. 09/11/21   Laurey Morale, MD  empagliflozin (JARDIANCE) 10 MG TABS tablet Take 1 tablet (10 mg total) by mouth daily before breakfast. 02/22/22   Laurey Morale, MD  ferrous sulfate 325 (65 FE) MG tablet TAKE 1 TABLET BY MOUTH EVERY DAY 09/14/20   [name], FNP  Magnesium Oxide 400  MG CAPS Take 1 capsule (400 mg total) by mouth 2 (two) times daily. 03/04/20   Little Ishikawa, MD  Multiple Vitamin (MULTIVITAMIN) tablet Take 1 tablet by mouth daily.    [provider]  Omega-3 1000 MG CAPS Take 1,000 mg by mouth daily.    [provider]  sacubitril-valsartan (ENTRESTO) 97-103 MG Take 1 tablet by mouth 2 (two) times daily. 07/22/22   Laurey Morale, MD  sildenafil (VIAGRA) 100 MG tablet Take 100 mg by mouth daily as needed for erectile dysfunction. 01/07/22   [provider]  spironolactone (ALDACTONE)  25 MG tablet TAKE 1 TABLET BY MOUTH EVERY DAY 10/11/20   Robbie Lis M, PA-C  torsemide (DEMADEX) 20 MG tablet Take 20 mg by mouth daily.    [provider]  triamcinolone ointment (KENALOG) 0.5 % Apply 1 Application topically 2 (two) times daily. 12/18/22   [name], FNP     Vitals:   05/17/23 2015 05/17/23 2100 05/17/23 2227 05/18/23 0328  BP: (!) 141/97 105/82 125/89 126/86  Pulse: 98 90  87  Resp: 17 17 18    Temp:   98 F (36.7 C) 97.9 F (36.6 C)  TempSrc:   Oral Oral  SpO2: 100% 98% 98% 99%  Weight:   68.5 kg 68.5 kg  Height:   5\' 9"  (1.753 m)    Exam Gen alert, no distress No rash, cyanosis or gangrene Sclera anicteric, throat clear  No jvd or bruits Chest clear bilat to bases, no rales/ wheezing RRR no MRG Abd soft ntnd no mass or ascites +bs GU normal male MS no joint effusions or deformity Ext no LE or UE edema, no wounds or ulcers Neuro is alert, Ox 3 , nf       Home meds include - coreg 25 bid, empaglifozin, entresto 97-103 bid, aldactone 25 qd, torsemide 20mg  qd, prns/ vits/ supps     From 05/17/23 --> UOsm 195 ,   UNa < 10   Labs 6/8 --> Na 118 (122, 124) K+ 3.9  CO 20 CL 84  BUN 9 creat 0.82 AST 102/ ALT 141/ tbili 3.2   Labs 6/9 --> Na 126   K+ 3.8  CO2 18  BUN 7  creat 0.94   Cl 93   Ca 8.4  alb 3.1   AST 98/ ALT 124/ tbili 2.3    ECHO - slowly improved from:             - EF < 20% in 02/2020        - EF 45-50% in 09/2020    - EF 45-50% in 03/2021     - EF 50-55% in 03/2022    VS today 120/ 86, HR 87, R 18, temp 98  Assessment/ Plan: Hyponatremia - Na+ 118 on presentation to ED for refractory N/V + diarrhea for last 1 week at home. Also in setting of multiple chronic CHF medications (torsemide, aldactone, entresto, empagliflozin). UNa was <10 and UOsm 195. Suspected hypovolemia as main cause from N/V/D + continued diuretics at home. CHF meds and SGLT2i were held and pt given isotonic saline overnight. Na+ improved up to 126 this am. Will  resume IVF"s w/ NS 0.9% at 75 cc/hr, cont to hold diuretics, ARB/ entresto and SGLT2i. Will follow.  N/V/diarrhea - improving today, wants to try to eat. Per pmd.  NICM - last echo EF 50- 55% in 2023. Per pmd.  ETOH abuse - 3 beers / day per pt ^LFT's -  per pmd      Vinson Moselle  MD [facility] 05/18/2023, 9:16 AM  Recent Labs  Lab 05/17/23 1237 05/18/23 0428  HGB 11.6* 10.8*  ALBUMIN 4.0 3.1*  CALCIUM 8.7* 8.4*  CREATININE 0.83 0.94  K 3.9 3.8   Inpatient medications:  enoxaparin (LOVENOX) injection  40 mg Subcutaneous Q24H   sodium chloride flush  3 mL Intravenous Q12H    acetaminophen **OR** acetaminophen, LORazepam **OR** LORazepam, oxyCODONE, senna-docusate

## 2023-05-18 NOTE — Progress Notes (Signed)
PROGRESS NOTE    Austin [name]  LKG:[id number] DOB: 1956/06/04 DOA: 05/17/2023 PCP: [name], FNP  Outpatient Specialists:     Brief Narrative:  Patient is a 67 year old African-American male with past medical history significant for nonischemic cardiomyopathy, hypertension and alcohol use.  Apparently, patient has had nausea, vomiting, loose stools for several days.  Patient presented with generalized weakness.  Patient continues to drink beer.  Patient drinks about 3 beers daily.  Workup done revealed sodium of 118, ALT of 141 and AST of 102.  Nephrology team was consulted on presentation and patient was started on IV fluids, normal saline at 75 cc/h.  Sodium level has improved to 126.  Will hold IV fluids for now.  Blood has been drawn for repeat BMP.  Likely, patient will be discharged back home tomorrow.  Nausea, vomiting and diarrhea have resolved.   Assessment & Plan:   Principal Problem:   Hyponatremia Active Problems:   ETOH abuse   Essential hypertension   NICM (nonischemic cardiomyopathy) (HCC)   Elevated LFTs   Prolonged QT interval   1. Hyponatremia  - Serum sodium was 118 in ED in setting of N/V/D and beer drinking   -With IV fluids normal saline 75 cc/h, sodium has improved to 126. -Blood has been drawn for repeat BMP. -DC IV fluid. -Continue to hold diuretic.   2. Elevated LFTs  - Alk phos 132, AST 102, ALT 141, and t bili 3.2 on admission  -Slight improvement in LFTs noted. -Follow RUQ [place]     3. NICM  - EF was 50-55% in May 2023   - Appears compensated  - Monitor weight and I/Os     4. Alcohol abuse  - Continue to monitor with CIWA, use Ativan as needed   -Counseled to quit alcohol use.   5. Prolonged QT interval  - QTc is 571 on admission  - Optimize potassium and magnesium, continue cardiac monitoring, avoid QT-prolonging medications  -Repeat EKG.   DVT prophylaxis: Subcutaneous Lovenox Code Status: Full code Family Communication:  Wife Disposition Plan: Likely discharge home tomorrow.   Consultants:  None  Procedures:  None  Antimicrobials:  None   Subjective: Vomiting, nausea and diarrhea have resolved.  Objective: Vitals:   05/17/23 2015 05/17/23 2100 05/17/23 2227 05/18/23 0328  BP: (!) 141/97 105/82 125/89 126/86  Pulse: 98 90  87  Resp: 17 17 18    Temp:   98 F (36.7 C) 97.9 F (36.6 C)  TempSrc:   Oral Oral  SpO2: 100% 98% 98% 99%  Weight:   68.5 kg 68.5 kg  Height:   5\' 9"  (1.753 m)     Intake/Output Summary (Last 24 hours) at 05/18/2023 1106 Last data filed at 05/18/2023 0344 Gross per 24 hour  Intake 1048.24 ml  Output 100 ml  Net 948.24 ml   Filed Weights   05/17/23 1536 05/17/23 2227 05/18/23 0328  Weight: 68 kg 68.5 kg 68.5 kg    Examination:  General exam: Appears calm and comfortable  Respiratory system: Clear to auscultation. Cardiovascular system: S1 & S2 heard Gastrointestinal system: Abdomen is soft and nontender.   Central nervous system: Awake and alert.  Patient moves all extremities.   Extremities: No leg edema.  Data Reviewed: I have personally reviewed following labs and imaging studies  CBC: Recent Labs  Lab 05/17/23 1237 05/18/23 0428  WBC 6.6 5.5  HGB 11.6* 10.8*  HCT 33.0* 31.4*  MCV 88.9 89.0  PLT 233 218  Basic Metabolic Panel: Recent Labs  Lab 05/17/23 1237 05/17/23 1441 05/17/23 2016 05/18/23 0018 05/18/23 0428  NA 118*  --  122* 124* 126*  K 3.9  --   --   --  3.8  CL 84*  --   --   --  93*  CO2 20*  --   --   --  18*  GLUCOSE 81  --   --   --  84  BUN 9  --   --   --  7*  CREATININE 0.83  --   --   --  0.94  CALCIUM 8.7*  --   --   --  8.4*  MG  --  1.7  --   --   --    GFR: Estimated Creatinine Clearance: 73.9 mL/min (by C-G formula based on SCr of 0.94 mg/dL). Liver Function Tests: Recent Labs  Lab 05/17/23 1237 05/18/23 0428  AST 102* 98*  ALT 141* 124*  ALKPHOS 132* 110  BILITOT 3.2* 2.3*  PROT 7.5 6.5  ALBUMIN  4.0 3.1*   Recent Labs  Lab 05/17/23 1237  LIPASE 72*   No results for input(s): "AMMONIA" in the last 168 hours. Coagulation Profile: Recent Labs  Lab 05/17/23 1441 05/18/23 0428  INR 1.1 1.2   Cardiac Enzymes: No results for input(s): "CKTOTAL", "CKMB", "CKMBINDEX", "TROPONINI" in the last 168 hours. BNP (last 3 results) No results for input(s): "PROBNP" in the last 8760 hours. HbA1C: No results for input(s): "HGBA1C" in the last 72 hours. CBG: No results for input(s): "GLUCAP" in the last 168 hours. Lipid Profile: No results for input(s): "CHOL", "HDL", "LDLCALC", "TRIG", "CHOLHDL", "LDLDIRECT" in the last 72 hours. Thyroid Function Tests: No results for input(s): "TSH", "T4TOTAL", "FREET4", "T3FREE", "THYROIDAB" in the last 72 hours. Anemia Panel: No results for input(s): "VITAMINB12", "FOLATE", "FERRITIN", "TIBC", "IRON", "RETICCTPCT" in the last 72 hours. Urine analysis:    Component Value Date/Time   COLORURINE YELLOW 05/17/2023 1227   APPEARANCEUR CLEAR 05/17/2023 1227   LABSPEC 1.006 05/17/2023 1227   PHURINE 6.0 05/17/2023 1227   GLUCOSEU NEGATIVE 05/17/2023 1227   HGBUR NEGATIVE 05/17/2023 1227   BILIRUBINUR NEGATIVE 05/17/2023 1227   BILIRUBINUR negative 08/14/2022 1408   KETONESUR TRACE (A) 05/17/2023 1227   PROTEINUR 30 (A) 05/17/2023 1227   UROBILINOGEN 1.0 08/14/2022 1408   UROBILINOGEN 1.0 08/27/2007 2227   NITRITE NEGATIVE 05/17/2023 1227   LEUKOCYTESUR NEGATIVE 05/17/2023 1227   Sepsis Labs: @LABRCNTIP (procalcitonin:4,lacticidven:4)  )No results found for this or any previous visit (from the past 240 hour(s)).       Radiology Studies: US Abdomen Limited RUQ (LIVER/GB)  Result Date: 05/18/2023 CLINICAL DATA:  Elevated liver function tests EXAM: ULTRASOUND ABDOMEN LIMITED RIGHT UPPER QUADRANT COMPARISON:  Abdominal CT from yesterday FINDINGS: Gallbladder: No gallstones or wall thickening visualized. No sonographic Murphy sign noted by  sonographer. Common bile duct: Diameter: 2 mm Liver: No focal lesion identified. Within normal limits in parenchymal echogenicity. Portal vein is patent on color Doppler imaging with normal direction of blood flow towards the liver. Other: Known right pleural effusion. IMPRESSION: Normal right upper quadrant ultrasound Electronically Signed   By: Tiburcio Pea M.D.   On: 05/18/2023 04:04   DG Chest Portable 1 View  Result Date: 05/17/2023 CLINICAL DATA:  Nausea and vomiting for 1 week EXAM: PORTABLE CHEST 1 VIEW COMPARISON:  03/26/2020 FINDINGS: Moderate right pleural effusion and suspected trace left pleural effusion. Mild to moderate cardiomegaly. Subsegmental atelectasis or scarring at  the left lung base. Old healed left clavicular fracture. IMPRESSION: 1. Moderate right pleural effusion and suspected trace left pleural effusion. 2. Mild to moderate cardiomegaly. 3. Old healed left clavicular fracture. Electronically Signed   By: Gaylyn Rong M.D.   On: 05/17/2023 15:39   CT ABDOMEN PELVIS W CONTRAST  Result Date: 05/17/2023 CLINICAL DATA:  Vomiting and diarrhea.  Transaminitis. EXAM: CT ABDOMEN AND PELVIS WITH CONTRAST TECHNIQUE: Multidetector CT imaging of the abdomen and pelvis was performed using the standard protocol following bolus administration of intravenous contrast. RADIATION DOSE REDUCTION: This exam was performed according to the departmental dose-optimization program which includes automated exposure control, adjustment of the mA and/or kV according to patient size and/or use of iterative reconstruction technique. CONTRAST:  80mL OMNIPAQUE IOHEXOL 300 MG/ML  SOLN COMPARISON:  None Available. FINDINGS: Lower chest: Chest there is collapse/consolidative disease in the right lower lobe with minimal subsegmental atelectasis at the left base. Tiny left and moderate right pleural effusions. Hepatobiliary: Heterogeneous liver parenchyma likely secondary to geographic fatty deposition. No  suspicious focal abnormality within the liver parenchyma. There is no evidence for gallstones, gallbladder wall thickening, or pericholecystic fluid. No intrahepatic or extrahepatic biliary dilation. Pancreas: No focal mass lesion. No dilatation of the main duct. No intraparenchymal cyst. No peripancreatic edema. Spleen: No splenomegaly. No focal mass lesion. Adrenals/Urinary Tract: No adrenal nodule or mass. Kidneys unremarkable. No evidence for hydroureter. Mild circumferential bladder wall thickening evident. Stomach/Bowel: Stomach is unremarkable. No gastric wall thickening. No evidence of outlet obstruction. Duodenum is normally positioned as is the ligament of Treitz. No small bowel wall thickening. No small bowel dilatation. The appendix is normal. No gross colonic mass. No colonic wall thickening. Diverticular changes are noted in the left colon without evidence of diverticulitis. Vascular/Lymphatic: No abdominal aortic aneurysm. No abdominal aortic atherosclerotic calcification. There is no gastrohepatic or hepatoduodenal ligament lymphadenopathy. No retroperitoneal or mesenteric lymphadenopathy. No pelvic sidewall lymphadenopathy. Reproductive: The prostate gland and seminal vesicles are unremarkable. Other: Small volume free fluid noted around the liver and in the right paracolic gutter. Small volume free fluid seen in the pelvis. Musculoskeletal: No worrisome lytic or sclerotic osseous abnormality. IMPRESSION: 1. Small volume free fluid in the abdomen and pelvis. Etiology uncertain. 2. Collapse/consolidative disease in the right lower lobe with tiny left and moderate right pleural effusions. 3. Heterogeneous liver parenchyma likely secondary to geographic fatty deposition. 4. Mild circumferential bladder wall thickening. Correlation with urinalysis recommended to exclude cystitis. 5. Left colonic diverticulosis without diverticulitis. Electronically Signed   By: Kennith Center M.D.   On: 05/17/2023 15:24         Scheduled Meds:  enoxaparin (LOVENOX) injection  40 mg Subcutaneous Q24H   sodium chloride flush  3 mL Intravenous Q12H   Continuous Infusions:  sodium chloride 75 mL/hr at 05/18/23 1046     LOS: 1 day    Time spent: 55 minutes.    Berton Mount, MD  [facility] Pager #: [phone] 7PM-7AM contact night coverage as above

## 2023-05-19 DIAGNOSIS — E871 Hypo-osmolality and hyponatremia: Secondary | ICD-10-CM | POA: Diagnosis not present

## 2023-05-19 LAB — COMPREHENSIVE METABOLIC PANEL
ALT: 107 U/L — ABNORMAL HIGH (ref 0–44)
AST: 78 U/L — ABNORMAL HIGH (ref 15–41)
Albumin: 2.9 g/dL — ABNORMAL LOW (ref 3.5–5.0)
Alkaline Phosphatase: 94 U/L (ref 38–126)
Anion gap: 9 (ref 5–15)
BUN: 5 mg/dL — ABNORMAL LOW (ref 8–23)
CO2: 22 mmol/L (ref 22–32)
Calcium: 8.4 mg/dL — ABNORMAL LOW (ref 8.9–10.3)
Chloride: 94 mmol/L — ABNORMAL LOW (ref 98–111)
Creatinine, Ser: 0.87 mg/dL (ref 0.61–1.24)
GFR, Estimated: >60 mL/min (ref >60)
Glucose, Bld: 114 mg/dL — ABNORMAL HIGH (ref 70–99)
Potassium: 3.5 mmol/L (ref 3.5–5.1)
Sodium: 125 mmol/L — ABNORMAL LOW (ref 135–145)
Total Bilirubin: 2 mg/dL — ABNORMAL HIGH (ref 0.3–1.2)
Total Protein: 6.3 g/dL — ABNORMAL LOW (ref 6.5–8.1)

## 2023-05-19 LAB — CBC
HCT: 30.3 % — ABNORMAL LOW (ref 39.0–52.0)
Hemoglobin: 10.3 g/dL — ABNORMAL LOW (ref 13.0–17.0)
MCH: 31.1 pg (ref 26.0–34.0)
MCHC: 34 g/dL (ref 30.0–36.0)
MCV: 91.5 fL (ref 80.0–100.0)
Platelets: 225 10*3/uL (ref 150–400)
RBC: 3.31 MIL/uL — ABNORMAL LOW (ref 4.22–5.81)
RDW: 18.1 % — ABNORMAL HIGH (ref 11.5–15.5)
WBC: 5.6 10*3/uL (ref 4.0–10.5)
nRBC: 0 % (ref 0.0–0.2)

## 2023-05-19 LAB — OSMOLALITY, URINE: Osmolality, Ur: 331 mOsm/kg (ref 300–900)

## 2023-05-19 LAB — ETHANOL: Alcohol, Ethyl (B): 10 mg/dL — ABNORMAL HIGH (ref <10)

## 2023-05-19 LAB — SODIUM, URINE, RANDOM: Sodium, Ur: 74 mmol/L

## 2023-05-19 LAB — OSMOLALITY: Osmolality: 284 mOsm/kg (ref 275–295)

## 2023-05-19 MED ORDER — TORSEMIDE 20 MG PO TABS
20.0000 mg | ORAL_TABLET | Freq: Every day | ORAL | Status: DC
  Administered 2023-05-19 – 2023-05-20 (×2): 20 mg via ORAL
  Filled 2023-05-19 (×2): qty 1

## 2023-05-19 MED ORDER — LORAZEPAM 1 MG PO TABS
2.0000 mg | ORAL_TABLET | Freq: Once | ORAL | Status: AC
  Administered 2023-05-19: 2 mg via ORAL
  Filled 2023-05-19: qty 2

## 2023-05-19 MED ORDER — SODIUM CHLORIDE 0.9 % IV SOLN: INTRAVENOUS | Status: DC

## 2023-05-19 MED ORDER — EMPAGLIFLOZIN 10 MG PO TABS
10.0000 mg | ORAL_TABLET | Freq: Every day | ORAL | Status: DC
  Administered 2023-05-19 – 2023-05-20 (×2): 10 mg via ORAL
  Filled 2023-05-19 (×2): qty 1

## 2023-05-19 NOTE — Progress Notes (Signed)
PROGRESS NOTE    Austin ALBERTA  [name]:[id number] DOB: 13-Aug-1956 DOA: 05/17/2023 PCP: [name], FNP  Outpatient Specialists:     Brief Narrative:  Patient is a 67 year old African-American male with past medical history significant for nonischemic cardiomyopathy, hypertension and alcohol use.  Apparently, patient has had nausea, vomiting, loose stools for several days.  Patient presented with generalized weakness.  Patient continues to drink beer.  Patient drinks about 3 beers daily.  Workup done revealed sodium of 118, ALT of 141 and AST of 102.  Nephrology team was consulted on presentation and patient was started on IV fluids, normal saline at 75 cc/h.  Sodium level has improved to 126.  Will hold IV fluids for now.  Blood has been drawn for repeat BMP.  Likely, patient will be discharged back home tomorrow.  Nausea, vomiting and diarrhea have resolved.  05/19/2023: Volume depletion has been corrected.  Sodium improved to 129, however, last documented sodium level was 125.  Patient continues to drink alcohol even as an inpatient at the hospital.  Empty alcohol bottles were found in patient's room and patient seems inebriated.  Will check alcohol level.  Managing patient will be very challenging if patient continues to drink alcohol, even while in the hospital.  Patient went into SVT earlier today.  Suspect SVT is alcohol related.   Assessment & Plan:   Principal Problem:   Hyponatremia Active Problems:   ETOH abuse   Essential hypertension   NICM (nonischemic cardiomyopathy) (HCC)   Elevated LFTs   Prolonged QT interval   1. Hyponatremia  - Serum sodium was 118 in ED in setting of N/V/D and beer drinking   -With IV fluids normal saline 75 cc/h, sodium has improved to 126. -Blood has been drawn for repeat BMP. -DC IV fluid. -Continue to hold diuretic. 05/19/2023: Hyponatremia is likely secondary to volume depletion and beer potomania.  Volume depletion has resolved.  Patient is  finding it difficult staying off alcohol.   2. Elevated LFTs  - Alk phos 132, AST 102, ALT 141, and t bili 3.2 on admission  -Slight improvement in LFTs noted. -Follow RUQ [place]   05/19/2023: Right upper quadrant ultrasound came back normal.   3. NICM  - EF was 50-55% in May 2023   - Appears compensated  - Monitor weight and I/Os     4. Alcohol abuse  - Continue to monitor with CIWA, use Ativan as needed   -Counseled to quit alcohol use. 05/19/2023: Continuous alcohol abuse.  Patient continues to abuse alcohol level whilst an inpatient at the hospital.   5. Prolonged QT interval  - QTc is 571 on admission  - Optimize potassium and magnesium, continue cardiac monitoring, avoid QT-prolonging medications  05/19/2023: Avoid QTc prolongation medication if possible.  Continue to monitor with serial EKG   DVT prophylaxis: Subcutaneous Lovenox Code Status: Full code Family Communication: Wife Disposition Plan: Likely discharge home tomorrow.   Consultants:  None  Procedures:  None  Antimicrobials:  None   Subjective: Vomiting, nausea and diarrhea have resolved.  Objective: Vitals:   05/18/23 1730 05/18/23 2050 05/19/23 0520 05/19/23 0814  BP: 116/85 (!) 127/94 111/81 118/83  Pulse: 94 92 92 97  Resp: 19 18 18 18   Temp:  (!) 97.4 F (36.3 C) (!) 97.3 F (36.3 C) 98.5 F (36.9 C)  TempSrc:  Oral Oral Oral  SpO2: 99% 90% 94% 99%  Weight:   68.3 kg   Height:  Intake/Output Summary (Last 24 hours) at 05/19/2023 1211 Last data filed at 05/18/2023 1700 Gross per 24 hour  Intake --  Output 450 ml  Net -450 ml    Filed Weights   05/17/23 2227 05/18/23 0328 05/19/23 0520  Weight: 68.5 kg 68.5 kg 68.3 kg    Examination:  General exam: Appears inebriated. Respiratory system: Clear to auscultation. Cardiovascular system: S1 & S2 heard Gastrointestinal system: Abdomen is soft and nontender.   Central nervous system: Awake and alert.  Patient moves all extremities.    Extremities: No leg edema.  Data Reviewed: I have personally reviewed following labs and imaging studies  CBC: Recent Labs  Lab 05/17/23 1237 05/18/23 0428 05/19/23 0255  WBC 6.6 5.5 5.6  HGB 11.6* 10.8* 10.3*  HCT 33.0* 31.4* 30.3*  MCV 88.9 89.0 91.5  PLT 233 218 225    Basic Metabolic Panel: Recent Labs  Lab 05/17/23 1237 05/17/23 1441 05/17/23 2016 05/18/23 0018 05/18/23 0428 05/18/23 1310 05/18/23 2152 05/19/23 0255  NA 118*  --    < > 124* 126* 128* 129* 125*  K 3.9  --   --   --  3.8  --   --  3.5  CL 84*  --   --   --  93*  --   --  94*  CO2 20*  --   --   --  18*  --   --  22  GLUCOSE 81  --   --   --  84  --   --  114*  BUN 9  --   --   --  7*  --   --  5*  CREATININE 0.83  --   --   --  0.94  --   --  0.87  CALCIUM 8.7*  --   --   --  8.4*  --   --  8.4*  MG  --  1.7  --   --   --   --   --   --    < > = values in this interval not displayed.    GFR: Estimated Creatinine Clearance: 79.6 mL/min (by C-G formula based on SCr of 0.87 mg/dL). Liver Function Tests: Recent Labs  Lab 05/17/23 1237 05/18/23 0428 05/19/23 0255  AST 102* 98* 78*  ALT 141* 124* 107*  ALKPHOS 132* 110 94  BILITOT 3.2* 2.3* 2.0*  PROT 7.5 6.5 6.3*  ALBUMIN 4.0 3.1* 2.9*    Recent Labs  Lab 05/17/23 1237  LIPASE 72*    No results for input(s): "AMMONIA" in the last 168 hours. Coagulation Profile: Recent Labs  Lab 05/17/23 1441 05/18/23 0428  INR 1.1 1.2    Cardiac Enzymes: No results for input(s): "CKTOTAL", "CKMB", "CKMBINDEX", "TROPONINI" in the last 168 hours. BNP (last 3 results) No results for input(s): "PROBNP" in the last 8760 hours. HbA1C: No results for input(s): "HGBA1C" in the last 72 hours. CBG: No results for input(s): "GLUCAP" in the last 168 hours. Lipid Profile: No results for input(s): "CHOL", "HDL", "LDLCALC", "TRIG", "CHOLHDL", "LDLDIRECT" in the last 72 hours. Thyroid Function Tests: No results for input(s): "TSH", "T4TOTAL",  "FREET4", "T3FREE", "THYROIDAB" in the last 72 hours. Anemia Panel: No results for input(s): "VITAMINB12", "FOLATE", "FERRITIN", "TIBC", "IRON", "RETICCTPCT" in the last 72 hours. Urine analysis:    Component Value Date/Time   COLORURINE YELLOW 05/17/2023 1227   APPEARANCEUR CLEAR 05/17/2023 1227   LABSPEC 1.006 05/17/2023 1227   PHURINE 6.0 05/17/2023 1227  GLUCOSEU NEGATIVE 05/17/2023 1227   HGBUR NEGATIVE 05/17/2023 1227   BILIRUBINUR NEGATIVE 05/17/2023 1227   BILIRUBINUR negative 08/14/2022 1408   KETONESUR TRACE (A) 05/17/2023 1227   PROTEINUR 30 (A) 05/17/2023 1227   UROBILINOGEN 1.0 08/14/2022 1408   UROBILINOGEN 1.0 08/27/2007 2227   NITRITE NEGATIVE 05/17/2023 1227   LEUKOCYTESUR NEGATIVE 05/17/2023 1227   Sepsis Labs: @LABRCNTIP (procalcitonin:4,lacticidven:4)  )No results found for this or any previous visit (from the past 240 hour(s)).       Radiology Studies: US Abdomen Limited RUQ (LIVER/GB)  Result Date: 05/18/2023 CLINICAL DATA:  Elevated liver function tests EXAM: ULTRASOUND ABDOMEN LIMITED RIGHT UPPER QUADRANT COMPARISON:  Abdominal CT from yesterday FINDINGS: Gallbladder: No gallstones or wall thickening visualized. No sonographic Murphy sign noted by sonographer. Common bile duct: Diameter: 2 mm Liver: No focal lesion identified. Within normal limits in parenchymal echogenicity. Portal vein is patent on color Doppler imaging with normal direction of blood flow towards the liver. Other: Known right pleural effusion. IMPRESSION: Normal right upper quadrant ultrasound Electronically Signed   By: Tiburcio Pea M.D.   On: 05/18/2023 04:04   DG Chest Portable 1 View  Result Date: 05/17/2023 CLINICAL DATA:  Nausea and vomiting for 1 week EXAM: PORTABLE CHEST 1 VIEW COMPARISON:  03/26/2020 FINDINGS: Moderate right pleural effusion and suspected trace left pleural effusion. Mild to moderate cardiomegaly. Subsegmental atelectasis or scarring at the left lung base. Old  healed left clavicular fracture. IMPRESSION: 1. Moderate right pleural effusion and suspected trace left pleural effusion. 2. Mild to moderate cardiomegaly. 3. Old healed left clavicular fracture. Electronically Signed   By: Gaylyn Rong M.D.   On: 05/17/2023 15:39   CT ABDOMEN PELVIS W CONTRAST  Result Date: 05/17/2023 CLINICAL DATA:  Vomiting and diarrhea.  Transaminitis. EXAM: CT ABDOMEN AND PELVIS WITH CONTRAST TECHNIQUE: Multidetector CT imaging of the abdomen and pelvis was performed using the standard protocol following bolus administration of intravenous contrast. RADIATION DOSE REDUCTION: This exam was performed according to the departmental dose-optimization program which includes automated exposure control, adjustment of the mA and/or kV according to patient size and/or use of iterative reconstruction technique. CONTRAST:  80mL OMNIPAQUE IOHEXOL 300 MG/ML  SOLN COMPARISON:  None Available. FINDINGS: Lower chest: Chest there is collapse/consolidative disease in the right lower lobe with minimal subsegmental atelectasis at the left base. Tiny left and moderate right pleural effusions. Hepatobiliary: Heterogeneous liver parenchyma likely secondary to geographic fatty deposition. No suspicious focal abnormality within the liver parenchyma. There is no evidence for gallstones, gallbladder wall thickening, or pericholecystic fluid. No intrahepatic or extrahepatic biliary dilation. Pancreas: No focal mass lesion. No dilatation of the main duct. No intraparenchymal cyst. No peripancreatic edema. Spleen: No splenomegaly. No focal mass lesion. Adrenals/Urinary Tract: No adrenal nodule or mass. Kidneys unremarkable. No evidence for hydroureter. Mild circumferential bladder wall thickening evident. Stomach/Bowel: Stomach is unremarkable. No gastric wall thickening. No evidence of outlet obstruction. Duodenum is normally positioned as is the ligament of Treitz. No small bowel wall thickening. No small bowel  dilatation. The appendix is normal. No gross colonic mass. No colonic wall thickening. Diverticular changes are noted in the left colon without evidence of diverticulitis. Vascular/Lymphatic: No abdominal aortic aneurysm. No abdominal aortic atherosclerotic calcification. There is no gastrohepatic or hepatoduodenal ligament lymphadenopathy. No retroperitoneal or mesenteric lymphadenopathy. No pelvic sidewall lymphadenopathy. Reproductive: The prostate gland and seminal vesicles are unremarkable. Other: Small volume free fluid noted around the liver and in the right paracolic gutter. Small volume free fluid seen  in the pelvis. Musculoskeletal: No worrisome lytic or sclerotic osseous abnormality. IMPRESSION: 1. Small volume free fluid in the abdomen and pelvis. Etiology uncertain. 2. Collapse/consolidative disease in the right lower lobe with tiny left and moderate right pleural effusions. 3. Heterogeneous liver parenchyma likely secondary to geographic fatty deposition. 4. Mild circumferential bladder wall thickening. Correlation with urinalysis recommended to exclude cystitis. 5. Left colonic diverticulosis without diverticulitis. Electronically Signed   By: Kennith Center M.D.   On: 05/17/2023 15:24        Scheduled Meds:  empagliflozin  10 mg Oral Daily   enoxaparin (LOVENOX) injection  40 mg Subcutaneous Q24H   sodium chloride flush  3 mL Intravenous Q12H   torsemide  20 mg Oral Daily   Continuous Infusions:     LOS: 2 days    Time spent: 55 minutes.    Berton Mount, MD  [facility] Pager #: [phone] 7PM-7AM contact night coverage as above

## 2023-05-19 NOTE — Care Management Important Message (Signed)
Important Message  Patient Details  Name: ZAYDEN MAFFEI MRN: [id number] Date of Birth: 10-Dec-1955   Medicare Important Message Given:  Yes     Sherilyn Banker 05/19/2023, 3:01 PM

## 2023-05-19 NOTE — Progress Notes (Signed)
Heart Failure Navigator Progress Note  Assessed for Heart & Vascular TOC clinic readiness.  Patient is already established with advanced heart failure clinic, sees Dr. Shirlee Latch.   Sharen Hones, PharmD, BCPS Heart Failure Stewardship Pharmacist Phone [phone]

## 2023-05-19 NOTE — Progress Notes (Signed)
Patient ID: Austin [name], male   DOB: [birth year], 67 y.o.   MRN: [id number] [facility] Progress Note   Assessment/ Plan:   1.  Hyponatremia: Hypoosmolar and hypovolemic hyponatremia that appears to have been induced by excessive GI losses and unrestricted hypotonic fluids/water intake prior to admission; based on overnight events (confiscation of malt liquor from patient)-highly likely that excessive alcohol intake +/- beer potomania may be contributing to his hyponatremia.  Sodium level improved with transient intravenous fluids and holding diuretics.  At this time given plateaued sodium level, will restart torsemide and empagliflozin with the goal to restart spironolactone and Entresto tomorrow as sodium level started to improve. 2.  Nausea/vomiting/diarrhea: Resolved with symptomatic management. 3.  History of congestive heart failure/nonischemic cardiomyopathy: With preserved ejection fraction (EF 50 to 55%. 4.  History of alcohol abuse: Discussed risks/impact on hyponatremia and general health.  Subjective:   Reports to be feeling better today and denies any nausea/vomiting or diarrhea.   Objective:   BP 118/83 (BP Location: Left Arm)   Pulse 97   Temp 98.5 F (36.9 C) (Oral)   Resp 18   Ht 5\' 9"  (1.753 m)   Wt 68.3 kg   SpO2 99%   BMI 22.24 kg/m   Intake/Output Summary (Last 24 hours) at 05/19/2023 1055 Last data filed at 05/18/2023 1700 Gross per 24 hour  Intake --  Output 450 ml  Net -450 ml   Weight change: 0.261 kg  Physical Exam: Gen: Resting comfortably in bed, wife at bedside CVS: Pulse regular rhythm, normal rate, S1 and S2 normal Resp: Clear to auscultation bilaterally, no rales/rhonchi Abd: Soft, obese, nontender, bowel sounds normal Ext: No lower extremity edema  Imaging: US Abdomen Limited RUQ (LIVER/GB)  Result Date: 05/18/2023 CLINICAL DATA:  Elevated liver function tests EXAM: ULTRASOUND ABDOMEN LIMITED RIGHT UPPER QUADRANT COMPARISON:   Abdominal CT from yesterday FINDINGS: Gallbladder: No gallstones or wall thickening visualized. No sonographic Murphy sign noted by sonographer. Common bile duct: Diameter: 2 mm Liver: No focal lesion identified. Within normal limits in parenchymal echogenicity. Portal vein is patent on color Doppler imaging with normal direction of blood flow towards the liver. Other: Known right pleural effusion. IMPRESSION: Normal right upper quadrant ultrasound Electronically Signed   By: Tiburcio Pea M.D.   On: 05/18/2023 04:04   DG Chest Portable 1 View  Result Date: 05/17/2023 CLINICAL DATA:  Nausea and vomiting for 1 week EXAM: PORTABLE CHEST 1 VIEW COMPARISON:  03/26/2020 FINDINGS: Moderate right pleural effusion and suspected trace left pleural effusion. Mild to moderate cardiomegaly. Subsegmental atelectasis or scarring at the left lung base. Old healed left clavicular fracture. IMPRESSION: 1. Moderate right pleural effusion and suspected trace left pleural effusion. 2. Mild to moderate cardiomegaly. 3. Old healed left clavicular fracture. Electronically Signed   By: Gaylyn Rong M.D.   On: 05/17/2023 15:39   CT ABDOMEN PELVIS W CONTRAST  Result Date: 05/17/2023 CLINICAL DATA:  Vomiting and diarrhea.  Transaminitis. EXAM: CT ABDOMEN AND PELVIS WITH CONTRAST TECHNIQUE: Multidetector CT imaging of the abdomen and pelvis was performed using the standard protocol following bolus administration of intravenous contrast. RADIATION DOSE REDUCTION: This exam was performed according to the departmental dose-optimization program which includes automated exposure control, adjustment of the mA and/or kV according to patient size and/or use of iterative reconstruction technique. CONTRAST:  80mL OMNIPAQUE IOHEXOL 300 MG/ML  SOLN COMPARISON:  None Available. FINDINGS: Lower chest: Chest there is collapse/consolidative disease in the right lower lobe with  minimal subsegmental atelectasis at the left base. Tiny left and  moderate right pleural effusions. Hepatobiliary: Heterogeneous liver parenchyma likely secondary to geographic fatty deposition. No suspicious focal abnormality within the liver parenchyma. There is no evidence for gallstones, gallbladder wall thickening, or pericholecystic fluid. No intrahepatic or extrahepatic biliary dilation. Pancreas: No focal mass lesion. No dilatation of the main duct. No intraparenchymal cyst. No peripancreatic edema. Spleen: No splenomegaly. No focal mass lesion. Adrenals/Urinary Tract: No adrenal nodule or mass. Kidneys unremarkable. No evidence for hydroureter. Mild circumferential bladder wall thickening evident. Stomach/Bowel: Stomach is unremarkable. No gastric wall thickening. No evidence of outlet obstruction. Duodenum is normally positioned as is the ligament of Treitz. No small bowel wall thickening. No small bowel dilatation. The appendix is normal. No gross colonic mass. No colonic wall thickening. Diverticular changes are noted in the left colon without evidence of diverticulitis. Vascular/Lymphatic: No abdominal aortic aneurysm. No abdominal aortic atherosclerotic calcification. There is no gastrohepatic or hepatoduodenal ligament lymphadenopathy. No retroperitoneal or mesenteric lymphadenopathy. No pelvic sidewall lymphadenopathy. Reproductive: The prostate gland and seminal vesicles are unremarkable. Other: Small volume free fluid noted around the liver and in the right paracolic gutter. Small volume free fluid seen in the pelvis. Musculoskeletal: No worrisome lytic or sclerotic osseous abnormality. IMPRESSION: 1. Small volume free fluid in the abdomen and pelvis. Etiology uncertain. 2. Collapse/consolidative disease in the right lower lobe with tiny left and moderate right pleural effusions. 3. Heterogeneous liver parenchyma likely secondary to geographic fatty deposition. 4. Mild circumferential bladder wall thickening. Correlation with urinalysis recommended to exclude  cystitis. 5. Left colonic diverticulosis without diverticulitis. Electronically Signed   By: Kennith Center M.D.   On: 05/17/2023 15:24    Labs: BMET Recent Labs  Lab 05/17/23 1237 05/17/23 2016 05/18/23 0018 05/18/23 0428 05/18/23 1310 05/18/23 2152 05/19/23 0255  NA 118* 122* 124* 126* 128* 129* 125*  K 3.9  --   --  3.8  --   --  3.5  CL 84*  --   --  93*  --   --  94*  CO2 20*  --   --  18*  --   --  22  GLUCOSE 81  --   --  84  --   --  114*  BUN 9  --   --  7*  --   --  5*  CREATININE 0.83  --   --  0.94  --   --  0.87  CALCIUM 8.7*  --   --  8.4*  --   --  8.4*   CBC Recent Labs  Lab 05/17/23 1237 05/18/23 0428 05/19/23 0255  WBC 6.6 5.5 5.6  HGB 11.6* 10.8* 10.3*  HCT 33.0* 31.4* 30.3*  MCV 88.9 89.0 91.5  PLT 233 218 225    Medications:     enoxaparin (LOVENOX) injection  40 mg Subcutaneous Q24H   sodium chloride flush  3 mL Intravenous Q12H   Zetta Bills, MD 05/19/2023, 10:55 AM

## 2023-05-19 NOTE — Progress Notes (Signed)
Patient went into SVT, rate 130s. Dr. Dartha Lodge notified; new orders for EKG and 2mg  PO ativan placed. At about 1450, patient converted back to sinus tachycardia, rate 104. MEWS documentation complete below.   05/19/23 1403  Assess: MEWS Score  Temp 97.8 F (36.6 C)  BP 114/82  MAP (mmHg) 92  Pulse Rate (!) 133  ECG Heart Rate (!) 128  Resp 18  Level of Consciousness Alert  SpO2 98 %  O2 Device Room Air  Assess: MEWS Score  MEWS Temp 0  MEWS Systolic 0  MEWS Pulse 2  MEWS RR 0  MEWS LOC 0  MEWS Score 2  MEWS Score Color Yellow  Assess: if the MEWS score is Yellow or Red  Were vital signs taken at a resting state? Yes  Focused Assessment Change from prior assessment (see assessment flowsheet)  Does the patient meet 2 or more of the SIRS criteria? No  MEWS guidelines implemented  Yes, yellow  Treat  MEWS Interventions Considered administering scheduled or prn medications/treatments as ordered  Take Vital Signs  Increase Vital Sign Frequency  Yellow: Q2hr x1, continue Q4hrs until patient remains green for 12hrs  Escalate  MEWS: Escalate Yellow: Discuss with charge nurse and consider notifying provider and/or RRT  Notify: Charge Nurse/RN  Name of Charge Nurse/RN Notified Gertie Baron RN  Provider Notification  Provider Name/Title Dr. Dartha Lodge  Date Provider Notified 05/19/23  Time Provider Notified 1415  Method of Notification Page  Notification Reason New onset of dysrhythmia  Provider response See new orders  Date of Provider Response 05/19/23  Time of Provider Response 1430  Assess: SIRS CRITERIA  SIRS Temperature  0  SIRS Pulse 1  SIRS Respirations  0  SIRS WBC 0  SIRS Score Sum  1

## 2023-05-19 NOTE — Progress Notes (Signed)
Called to room by patient's wife. Per wife, patient was attempting to use the urinal and when she looked over, he was slumped on the floor and his face was resting on a chair that was beside the bed. Patient was uninjured and vital signs were stable. Patient was assisted to the bathroom with a walker; patient's gait was unsteady and he was impulsive. He appeared to be intoxicated. Staff and wife smelled alcohol coming from a cup with orange liquid in the room. Dr. Dartha Lodge was notified and came to bedside. Patient was assisted to bed with bed alarm on. Condom catheter placed on patient due to episode of incontinence and urinary urgency.    05/19/23 1721  What Happened  Was fall witnessed? Yes  Who witnessed fall? Wife, Lorie  Patients activity before fall bathroom-unassisted  Point of contact other (comment) (wife is unsure, probably knees)  Was patient injured? No  Provider Notification  Provider Name/Title Dr. Dartha Lodge  Date Provider Notified 05/19/23  Time Provider Notified 1715  Method of Notification Face-to-face  Notification Reason Fall  Provider response At bedside  Date of Provider Response 05/19/23  Time of Provider Response 1715  Follow Up  Family notified Yes - comment (wife, [name], at bedside)  Time family notified 1715  Additional tests No  Progress note created (see row info) Yes  Adult Fall Risk Assessment  Risk Factor Category (scoring not indicated) Fall has occurred during this admission (document High fall risk)  Patient Fall Risk Level High fall risk  Adult Fall Risk Interventions  Required Bundle Interventions *See Row Information* High fall risk - low, moderate, and high requirements implemented  Screening for Fall Injury Risk (To be completed on HIGH fall risk patients) - Assessing Need for Floor Mats  Risk For Fall Injury- Criteria for Floor Mats Previous fall this admission  Will Implement Floor Mats Yes  Vitals  Temp 97.9 F (36.6 C)  Temp Source Oral  BP  (!) 117/91  MAP (mmHg) 100  BP Location Left Arm  BP Method Automatic  Patient Position (if appropriate) Lying  Pulse Rate (!) 104  Pulse Rate Source Monitor  ECG Heart Rate (!) 106  Resp 16  Oxygen Therapy  SpO2 95 %  O2 Device Room Air  Pain Assessment  Pain Scale 0-10  Pain Score 0  Patients Stated Pain Goal 0  Neurological  Neuro (WDL) X  Level of Consciousness Alert  Orientation Level Oriented to person;Oriented to place;Disoriented to situation  Ecologist  Neuro Symptoms Drowsiness  Glasgow Coma Scale  Eye Opening 4  Best Verbal Response (NON-intubated) 4  Best Motor Response 6  Musculoskeletal  Musculoskeletal (WDL) X (very unsteady at this time with poor balance)  Assistive Device Standard walker  Generalized Weakness Yes  Weight Bearing Restrictions No  Integumentary  Integumentary (WDL) WDL

## 2023-05-19 NOTE — Progress Notes (Signed)
Security was called to search pt's room because of smell of alcohol. Security found empty bottles/cans of alcohol. Pt looks intoxicated.

## 2023-05-19 NOTE — Progress Notes (Signed)
Staff removed malt Liquor from patient possession. Patient was educated about the important of noting drinking beer while at the hospital and well as the benefits of not drinking.

## 2023-05-20 ENCOUNTER — Other Ambulatory Visit (HOSPITAL_COMMUNITY): Payer: Self-pay

## 2023-05-20 ENCOUNTER — Inpatient Hospital Stay (HOSPITAL_COMMUNITY): Payer: Medicare Other

## 2023-05-20 LAB — COMPREHENSIVE METABOLIC PANEL
ALT: 103 U/L — ABNORMAL HIGH (ref 0–44)
AST: 70 U/L — ABNORMAL HIGH (ref 15–41)
Albumin: 3.3 g/dL — ABNORMAL LOW (ref 3.5–5.0)
Alkaline Phosphatase: 96 U/L (ref 38–126)
Anion gap: 14 (ref 5–15)
BUN: 6 mg/dL — ABNORMAL LOW (ref 8–23)
CO2: 25 mmol/L (ref 22–32)
Calcium: 8.8 mg/dL — ABNORMAL LOW (ref 8.9–10.3)
Chloride: 91 mmol/L — ABNORMAL LOW (ref 98–111)
Creatinine, Ser: 1.11 mg/dL (ref 0.61–1.24)
GFR, Estimated: >60 mL/min (ref >60)
Glucose, Bld: 93 mg/dL (ref 70–99)
Potassium: 3.2 mmol/L — ABNORMAL LOW (ref 3.5–5.1)
Sodium: 130 mmol/L — ABNORMAL LOW (ref 135–145)
Total Bilirubin: 2.1 mg/dL — ABNORMAL HIGH (ref 0.3–1.2)
Total Protein: 7 g/dL (ref 6.5–8.1)

## 2023-05-20 LAB — CBC
HCT: 33.1 % — ABNORMAL LOW (ref 39.0–52.0)
Hemoglobin: 11.3 g/dL — ABNORMAL LOW (ref 13.0–17.0)
MCH: 31 pg (ref 26.0–34.0)
MCHC: 34.1 g/dL (ref 30.0–36.0)
MCV: 90.9 fL (ref 80.0–100.0)
Platelets: 236 10*3/uL (ref 150–400)
RBC: 3.64 MIL/uL — ABNORMAL LOW (ref 4.22–5.81)
RDW: 18.1 % — ABNORMAL HIGH (ref 11.5–15.5)
WBC: 4.7 10*3/uL (ref 4.0–10.5)
nRBC: 0 % (ref 0.0–0.2)

## 2023-05-20 MED ORDER — METOPROLOL SUCCINATE ER 25 MG PO TB24
12.5000 mg | ORAL_TABLET | Freq: Every day | ORAL | 0 refills | Status: DC
  Filled 2023-05-20: qty 30, 60d supply, fill #0

## 2023-05-20 MED ORDER — MAGNESIUM OXIDE 400 MG PO TABS
400.0000 mg | ORAL_TABLET | Freq: Two times a day (BID) | ORAL | 0 refills | Status: DC
  Filled 2023-05-20: qty 14, 7d supply, fill #0

## 2023-05-20 MED ORDER — MAGNESIUM SULFATE 2 GM/50ML IV SOLN
2.0000 g | Freq: Once | INTRAVENOUS | Status: AC
  Administered 2023-05-20: 2 g via INTRAVENOUS
  Filled 2023-05-20 (×2): qty 50

## 2023-05-20 MED ORDER — POTASSIUM CHLORIDE CRYS ER 20 MEQ PO TBCR
20.0000 meq | EXTENDED_RELEASE_TABLET | Freq: Two times a day (BID) | ORAL | Status: AC
  Administered 2023-05-20 (×2): 20 meq via ORAL
  Filled 2023-05-20 (×2): qty 1

## 2023-05-20 MED ORDER — TORSEMIDE 20 MG PO TABS
20.0000 mg | ORAL_TABLET | Freq: Every day | ORAL | 0 refills | Status: DC
  Filled 2023-05-20: qty 30, 30d supply, fill #0

## 2023-05-20 MED ORDER — POTASSIUM CHLORIDE CRYS ER 20 MEQ PO TBCR
40.0000 meq | EXTENDED_RELEASE_TABLET | Freq: Once | ORAL | Status: AC
  Administered 2023-05-20: 40 meq via ORAL
  Filled 2023-05-20: qty 2

## 2023-05-20 NOTE — Progress Notes (Signed)
While talking to patient's wife about the events of the patient's day and what lead up to the patient's fall from earlier in the day, the wife states the patient stood up. The wife asked him why he was getting up, the patient said because he need to use the urinal. Once the patient stood up, he fell and hit his head. Wife asked patient what happen and patient said he don't know he just got dizzy. Rathore MD paged at [phone].

## 2023-05-20 NOTE — TOC Benefit Eligibility Note (Signed)
Patient Product/process development scientist completed.    The patient is currently admitted and upon discharge could be taking Jardiance 10 mg.  The current 30 day co-pay is $47.00.   The patient is insured through [employer]   This test claim was processed through [facility]- copay amounts may vary at other pharmacies due to pharmacy/plan contracts, or as the patient moves through the different stages of their insurance plan.  Roland Earl, CPHT Pharmacy Patient Advocate Specialist [facility] Patient Advocate Team Direct Number: [phone]  Fax: [phone]

## 2023-05-20 NOTE — Progress Notes (Signed)
Patient ID: Austin [name], male   DOB: 1956-05-21, 67 y.o.   MRN: [id number] [facility] Progress Note   Assessment/ Plan:   1.  Hyponatremia: Hypoosmolar and hypovolemic hyponatremia that appears to have been induced by excessive GI losses and unrestricted hypotonic fluids/water intake prior to admission; highly likely that excessive alcohol intake +/- beer potomania may have been contributing to his hyponatremia.  Sodium level has initially improved with transient intravenous fluids and holding diuretics.  Overnight with additional improvement of sodium with restarting Farxiga and torsemide.  May restart Entresto today, defer restarting spironolactone until seen as an outpatient with more robust blood pressures. 2.  Nausea/vomiting/diarrhea: Resolved with symptomatic management. 3.  History of congestive heart failure/nonischemic cardiomyopathy: With preserved ejection fraction (EF 50 to 55%. 4.  History of alcohol abuse: Discussed risks/impact on hyponatremia and general health. 5.  Hypokalemia: Likely secondary to restarting diuretic and total body deficit secondary to alcohol use.  Will replace via oral route.  Nephrology will sign off at this time and remain available for questions or concerns.  He does not need outpatient nephrology follow-up at this time.  Subjective:   He suffered an accidental fall overnight while ambulating in his room, subsequent CT scan of the head negative for intracranial injury.   Objective:   BP 112/78 (BP Location: Left Arm)   Pulse 83   Temp 98.1 F (36.7 C) (Oral)   Resp 16   Ht 5\' 9"  (1.753 m)   Wt 68.3 kg   SpO2 95%   BMI 22.24 kg/m   Intake/Output Summary (Last 24 hours) at 05/20/2023 1024 Last data filed at 05/19/2023 2326 Gross per 24 hour  Intake --  Output 3250 ml  Net -3250 ml   Weight change:   Physical Exam: Gen: Resting comfortably in bed, wife at bedside CVS: Pulse regular rhythm, normal rate, S1 and S2  normal Resp: Clear to auscultation bilaterally, no rales/rhonchi Abd: Soft, obese, nontender, bowel sounds normal Ext: No lower extremity edema  Imaging: CT HEAD WO CONTRAST ( )  Result Date: 05/20/2023 CLINICAL DATA:  67 year old male with delirium. EXAM: CT HEAD WITHOUT CONTRAST TECHNIQUE: Contiguous axial images were obtained from the base of the skull through the vertex without intravenous contrast. RADIATION DOSE REDUCTION: This exam was performed according to the departmental dose-optimization program which includes automated exposure control, adjustment of the mA and/or kV according to patient size and/or use of iterative reconstruction technique. COMPARISON:  None Available. FINDINGS: Brain: No midline shift, ventriculomegaly, mass effect, evidence of mass lesion, intracranial hemorrhage or evidence of cortically based acute infarction. Cerebral volume loss appears generalized. No cortical encephalomalacia identified. Maintained bilateral gray-white differentiation, with only mild for age patchy white matter hypodensity which is most pronounced in the anterior frontal lobes. Vascular: No suspicious intracranial vascular hyperdensity. Mild Calcified atherosclerosis at the skull base. Skull: Negative. Sinuses/Orbits: Visualized paranasal sinuses and mastoids are stable and well aerated. Other: Visualized orbits and scalp soft tissues are within normal limits. IMPRESSION: 1. No acute intracranial abnormality. 2. Cerebral volume loss without disproportionate regional atrophy. Mild for age cerebral white matter changes, most commonly due to chronic small vessel disease. Electronically Signed   By: Odessa Fleming M.D.   On: 05/20/2023 04:35    Labs: BMET Recent Labs  Lab 05/17/23 1237 05/17/23 2016 05/18/23 0018 05/18/23 0428 05/18/23 1310 05/18/23 2152 05/19/23 0255 05/20/23 0229  NA 118* 122* 124* 126* 128* 129* 125* 130*  K 3.9  --   --  3.8  --   --  3.5 3.2*  CL 84*  --   --  93*  --   --   94* 91*  CO2 20*  --   --  18*  --   --  22 25  GLUCOSE 81  --   --  84  --   --  114* 93  BUN 9  --   --  7*  --   --  5* 6*  CREATININE 0.83  --   --  0.94  --   --  0.87 1.11  CALCIUM 8.7*  --   --  8.4*  --   --  8.4* 8.8*   CBC Recent Labs  Lab 05/17/23 1237 05/18/23 0428 05/19/23 0255 05/20/23 0229  WBC 6.6 5.5 5.6 4.7  HGB 11.6* 10.8* 10.3* 11.3*  HCT 33.0* 31.4* 30.3* 33.1*  MCV 88.9 89.0 91.5 90.9  PLT 233 218 225 236    Medications:     empagliflozin  10 mg Oral Daily   enoxaparin (LOVENOX) injection  40 mg Subcutaneous Q24H   potassium chloride  20 mEq Oral BID   sodium chloride flush  3 mL Intravenous Q12H   torsemide  20 mg Oral Daily   Zetta Bills, MD 05/20/2023, 10:24 AM

## 2023-05-20 NOTE — Social Work (Signed)
CSW received consult for substance use resources for patient. CSW following to complete consult when appropriate. CSW will continue to follow. 

## 2023-05-20 NOTE — Progress Notes (Signed)
Overnight progress note  67 year old male with past medical history of nonischemic cardiomyopathy, hypertension, alcohol use disorder admitted 6/8 for hyponatremia. Sodium was 118 on admission and now improved to 130.  Patient has continued to abuse alcohol while admitted to the hospital.  He had a fall during dayshift, unclear whether witnessed by staff.  Informed by night RN that patient's wife is concerned that he has become increasingly more confused since after his fall.  No focal neurodeficit reported by RN.  Stat CT head ordered.

## 2023-05-20 NOTE — Discharge Summary (Signed)
Physician Discharge Summary  Patient ID: Austin [name] MRN: [id number] DOB/AGE: April 08, 1956 67 y.o.  Admit date: 05/17/2023 Discharge date: 05/20/2023  Admission Diagnoses:  Discharge Diagnoses:  Principal Problem:   Hyponatremia Active Problems:   ETOH abuse   Essential hypertension   NICM (nonischemic cardiomyopathy) (HCC)   Elevated LFTs   Prolonged QT interval   Discharged Condition: stable  Hospital Course: Patient is a 67 year old African-American male with past medical history significant for nonischemic cardiomyopathy, hypertension and alcohol abuse.  Patient presented with generalized weakness, several days of nausea, vomiting and loose stools and sodium level of 118, ALT of 141 and AST of 102.  Patient continues to drink beer.  Patient drinks about 3 beers daily.  Patient was admitted for further assessment and management of hyponatremia.  Nephrology team was consulted on presentation and patient was started on IV fluids, normal saline at 75 cc/h.  Sodium level improved to 130 prior to discharge.  Unfortunately, patient has continued to drink alcohol, even while on admission at the hospital.  Patient has been optimized.  Patient will be discharged back home to the care of the primary care provider.    1. Hyponatremia  -Serum sodium was 118 in ED in setting of N/V/D and beer drinking on presentation. -Patient also drinks alcohol.  Patient was drinking alcohol even while admitted to the hospital.   -Suspect hyponatremia may be related to volume depletion and alcohol abuse.  Cannot rule out underlying SIADH that may be chronic. -With IV fluids normal saline 75 cc/h, sodium improved.  Sodium prior to discharge was 130.   2. Elevated LFTs  - Alk phos 132, AST 102, ALT 141, and t bili 3.2 on admission  -Slight improvement in LFTs noted. -Follow RUQ [place]   05/19/2023: Right upper quadrant ultrasound came back normal.   3. NICM  - EF was 50-55% in May 2023   - Appears compensated   - Monitor weight and I/Os     4. Alcohol abuse  - Continued to monitor with CIWA, used Ativan as needed   -Counseled to quit alcohol use. 05/19/2023: Continuous alcohol abuse.  Patient continues to abuse alcohol level whilst an inpatient at the hospital.   5. Prolonged QT interval  - QTc is 571 on admission  - Optimize potassium and magnesium, continue cardiac monitoring, avoid QT-prolonging medications  05/19/2023: Avoid QTc prolongation medication if possible.  Continue to monitor with serial EKG      Consults: nephrology   Discharge Exam: Blood pressure 113/80, pulse 93, temperature 98 F (36.7 C), temperature source Oral, resp. rate 18, height 5\' 9"  (1.753 m), weight 68.3 kg, SpO2 98 %.   Disposition: Discharge disposition: 01-Home or Self Care       Discharge Instructions     Diet - low sodium heart healthy   Complete by: As directed    Increase activity slowly   Complete by: As directed       Allergies as of 05/20/2023       Reactions   Latex Swelling, Rash        Medication List     STOP taking these medications    carvedilol 25 MG tablet Commonly known as: COREG   Entresto 97-103 MG Generic drug: sacubitril-valsartan   ferrous sulfate 325 (65 FE) MG tablet   Omega-3 1000 MG Caps   sildenafil 100 MG tablet Commonly known as: VIAGRA   spironolactone 25 MG tablet Commonly known as: ALDACTONE   triamcinolone ointment 0.5 %  Commonly known as: KENALOG       TAKE these medications    empagliflozin 10 MG Tabs tablet Commonly known as: JARDIANCE Take 1 tablet (10 mg total) by mouth daily before breakfast.   Magnesium Oxide 400 MG Caps Take 1 capsule (400 mg total) by mouth 2 (two) times daily for 7 days.   metoprolol succinate 25 MG 24 hr tablet Commonly known as: Toprol XL Take 0.5 tablets (12.5 mg total) by mouth daily.   multivitamin tablet Take 1 tablet by mouth daily.   torsemide 20 MG tablet Commonly known as: DEMADEX Take  1 tablet (20 mg total) by mouth daily.       Time spent: 35 minutes.  SignedBarnetta Chapel 05/20/2023, 2:54 PM

## 2023-05-20 NOTE — Progress Notes (Signed)
Patient is now confused. His baseline is alert and oriented x4. Patient's wife is concerned with this change because patient had a fall yesterday after consuming malt liquor ([employer]). Rathore MD notified about changes and concern.

## 2023-05-21 ENCOUNTER — Telehealth: Payer: Self-pay

## 2023-05-21 ENCOUNTER — Other Ambulatory Visit (HOSPITAL_COMMUNITY): Payer: Self-pay

## 2023-05-21 ENCOUNTER — Telehealth (HOSPITAL_COMMUNITY): Payer: Self-pay | Admitting: Cardiology

## 2023-05-21 NOTE — Telephone Encounter (Signed)
Meliton Rattan, RN with [facility] called after speaking with pts wife Wife questioned when to restart meds-in particular entresto   Per Prince Rome, NP Will need HFu to discuss  Hfu scheduled 6/17-pts wife aware

## 2023-05-21 NOTE — Transitions of Care (Post Inpatient/ED Visit) (Signed)
   05/21/2023  Name: Austin [name] MRN: [id number] DOB: 02-03-1956  Today's TOC FU Call Status: Today's TOC FU Call Status:: Unsuccessul Call (1st Attempt) Unsuccessful Call (1st Attempt) Date: 05/21/23  Attempted to reach the patient regarding the most recent Inpatient/ED visit.  Follow Up Plan: Additional outreach attempts will be made to reach the patient to complete the Transitions of Care (Post Inpatient/ED visit) call.     Antionette Fairy, RN,BSN,CCM [facility] Care Management Care Management Community Coordinator Direct Phone: [phone] Toll Free: [phone] Fax: [phone]

## 2023-05-21 NOTE — Transitions of Care (Post Inpatient/ED Visit) (Signed)
05/21/2023  Name: Austin [name] MRN: [id number] DOB: [birth year]  Today's TOC FU Call Status: Today's TOC FU Call Status:: Successful TOC FU Call Competed TOC FU Call Complete Date: 05/21/23 (Incoming call from pt's wife returning RN CM call.)  Transition Care Management Follow-up Telephone Call Date of Discharge: 05/20/23 Discharge Facility: [facility]) Type of Discharge: Inpatient Admission Primary Inpatient Discharge Diagnosis:: "hyponatremia" How have you been since you were released from the hospital?: Better (Spouse states that pt is doing well-family currently visiting with him.) Any questions or concerns?: Yes Patient Questions/Concerns:: Wife wants to know why patient can't resume taking Entresto. Patient Questions/Concerns Addressed: Notified Provider of Patient Questions/Concerns (RN CM contacted Dr. Alford Highland office-[phone]-spoke with Clarissa-advised of wife's concerns with pt not taking Entresto and wanting MD's input.)  Items Reviewed: Did you receive and understand the discharge instructions provided?: Yes Medications obtained,verified, and reconciled?: Yes (Medications Reviewed) Any new allergies since your discharge?: No Dietary orders reviewed?: Yes Type of Diet Ordered:: low salt/heart healthy Do you have support at home?: Yes People in Home: spouse Name of Support/Comfort Primary Source: Lorie  Medications Reviewed Today: Medications Reviewed Today     Reviewed by Charlyn Minerva, RN (Registered Nurse) on 05/21/23 at 1331  Med List Status: <None>   Medication Order Taking? Sig Documenting Provider Last Dose Status Informant  empagliflozin (JARDIANCE) 10 MG TABS tablet [id number] Yes Take 1 tablet (10 mg total) by mouth daily before breakfast. Laurey Morale, MD Taking Active Self, Pharmacy Records           Med Note Epimenio Sarin, Seth Bake May 18, 2023 12:45 PM) LF 09/23 for 90 DS. Pt is adamant he is taking this medication. Dispense  report does not support this claim.   magnesium oxide (MAG-OX) 400 MG tablet [id number] Yes Take 1 tablet (400 mg total) by mouth 2 (two) times daily. Barnetta Chapel, MD Taking Active   metoprolol succinate (TOPROL XL) 25 MG 24 hr tablet [id number]  Take 0.5 tablets (12.5 mg total) by mouth daily. Barnetta Chapel, MD  Active   Multiple Vitamin (MULTIVITAMIN) tablet [id number] Yes Take 1 tablet by mouth daily. [provider] Taking Active Self, Pharmacy Records  torsemide (DEMADEX) 20 MG tablet [id number] Yes Take 1 tablet (20 mg total) by mouth daily. Barnetta Chapel, MD Taking Active             Home Care and Equipment/Supplies: Were Home Health Services Ordered?: NA Any new equipment or medical supplies ordered?: NA  Functional Questionnaire: Do you need assistance with bathing/showering or dressing?: No Do you need assistance with meal preparation?: No Do you need assistance with eating?: No Do you have difficulty maintaining continence: No Do you need assistance with getting out of bed/getting out of a chair/moving?: No Do you have difficulty managing or taking your medications?: No  Follow up appointments reviewed: PCP Follow-up appointment confirmed?: Yes Date of PCP follow-up appointment?: 05/26/23 Follow-up Provider: [facility] Follow-up appointment confirmed?: NA Do you need transportation to your follow-up appointment?: No (wife states she takes pt to appts) Do you understand care options if your condition(s) worsen?: Yes-patient verbalized understanding  SDOH Interventions Today    Flowsheet Row Most Recent Value  SDOH Interventions   Food Insecurity Interventions Intervention Not Indicated  Transportation Interventions Intervention Not Indicated      TOC Interventions Today    Flowsheet Row Most Recent Value  TOC Interventions   TOC Interventions Discussed/Reviewed  TOC Interventions Discussed, Arranged PCP follow up  within 7 days/Care Guide scheduled, Contacted provider for patient needs      Interventions Today    Flowsheet Row Most Recent Value  Chronic Disease   Chronic disease during today's visit Congestive Heart Failure (CHF)  General Interventions   General Interventions Discussed/Reviewed General Interventions Discussed, Doctor Visits  Doctor Visits Discussed/Reviewed Doctor Visits Discussed, PCP, Specialist  PCP/Specialist Visits Compliance with follow-up visit  Mental Health Interventions   Mental Health Discussed/Reviewed Mental Health Discussed, Substance Abuse  [discussed with wife that SW referral had been placed while inpt for substance abuse but SW was unable to see pt while hospitalized-offered referral to [facility] SW-wife declined at this time]  Nutrition Interventions   Nutrition Discussed/Reviewed Nutrition Discussed, Adding fruits and vegetables, Decreasing salt  Pharmacy Interventions   Pharmacy Dicussed/Reviewed Pharmacy Topics Discussed, Medications and their functions       Alessandra Grout [facility] Care Management Care Management Community Coordinator Direct Phone: [phone] Toll Free: [phone] Fax: [phone]

## 2023-05-22 ENCOUNTER — Telehealth: Payer: Self-pay

## 2023-05-22 NOTE — Telephone Encounter (Signed)
Patient's appt on 05/15/23 was cancelled . Provider Advised to go to the ER, not able to keep any thing down, frequent vomiting with upto 10 stools per day. YL,RMA

## 2023-05-26 ENCOUNTER — Ambulatory Visit (HOSPITAL_BASED_OUTPATIENT_CLINIC_OR_DEPARTMENT_OTHER)
Admission: RE | Admit: 2023-05-26 | Discharge: 2023-05-26 | Disposition: A | Discharge status: Home / Self Care | Payer: Medicare Other | Source: Ambulatory Visit | Attending: Family Medicine | Admitting: Family Medicine

## 2023-05-26 ENCOUNTER — Emergency Department (HOSPITAL_COMMUNITY): Payer: Medicare Other

## 2023-05-26 ENCOUNTER — Telehealth: Payer: Self-pay | Admitting: Nurse Practitioner

## 2023-05-26 ENCOUNTER — Inpatient Hospital Stay: Payer: Medicare Other | Admitting: Nurse Practitioner

## 2023-05-26 ENCOUNTER — Other Ambulatory Visit: Payer: Self-pay

## 2023-05-26 ENCOUNTER — Encounter (HOSPITAL_COMMUNITY): Payer: Self-pay

## 2023-05-26 ENCOUNTER — Emergency Department (HOSPITAL_COMMUNITY)
Admission: EM | Admit: 2023-05-26 | Discharge: 2023-05-26 | Disposition: A | Discharge status: Home / Self Care | Payer: Medicare Other | Attending: Emergency Medicine | Admitting: Emergency Medicine

## 2023-05-26 VITALS — BP 65/54 | HR 74 | Wt 144.0 lb

## 2023-05-26 DIAGNOSIS — E871 Hypo-osmolality and hyponatremia: Secondary | ICD-10-CM | POA: Diagnosis not present

## 2023-05-26 DIAGNOSIS — I428 Other cardiomyopathies: Secondary | ICD-10-CM | POA: Insufficient documentation

## 2023-05-26 DIAGNOSIS — F1010 Alcohol abuse, uncomplicated: Secondary | ICD-10-CM | POA: Insufficient documentation

## 2023-05-26 DIAGNOSIS — I110 Hypertensive heart disease with heart failure: Secondary | ICD-10-CM | POA: Insufficient documentation

## 2023-05-26 DIAGNOSIS — D72819 Decreased white blood cell count, unspecified: Secondary | ICD-10-CM | POA: Diagnosis not present

## 2023-05-26 DIAGNOSIS — Y906 Blood alcohol level of 120-199 mg/100 ml: Secondary | ICD-10-CM | POA: Diagnosis not present

## 2023-05-26 DIAGNOSIS — Z79899 Other long term (current) drug therapy: Secondary | ICD-10-CM | POA: Insufficient documentation

## 2023-05-26 DIAGNOSIS — I959 Hypotension, unspecified: Secondary | ICD-10-CM | POA: Insufficient documentation

## 2023-05-26 DIAGNOSIS — R55 Syncope and collapse: Secondary | ICD-10-CM | POA: Diagnosis not present

## 2023-05-26 DIAGNOSIS — Z139 Encounter for screening, unspecified: Secondary | ICD-10-CM

## 2023-05-26 DIAGNOSIS — Z789 Other specified health status: Secondary | ICD-10-CM

## 2023-05-26 DIAGNOSIS — I5042 Chronic combined systolic (congestive) and diastolic (congestive) heart failure: Secondary | ICD-10-CM | POA: Insufficient documentation

## 2023-05-26 DIAGNOSIS — J9811 Atelectasis: Secondary | ICD-10-CM | POA: Diagnosis not present

## 2023-05-26 DIAGNOSIS — Z91040 Latex allergy status: Secondary | ICD-10-CM | POA: Insufficient documentation

## 2023-05-26 DIAGNOSIS — I509 Heart failure, unspecified: Secondary | ICD-10-CM | POA: Diagnosis not present

## 2023-05-26 DIAGNOSIS — Z8679 Personal history of other diseases of the circulatory system: Secondary | ICD-10-CM

## 2023-05-26 LAB — CBC WITH DIFFERENTIAL/PLATELET
Abs Immature Granulocytes: 0.01 10*3/uL (ref 0.00–0.07)
Basophils Absolute: 0.1 10*3/uL (ref 0.0–0.1)
Basophils Relative: 1 %
Eosinophils Absolute: 0 10*3/uL (ref 0.0–0.5)
Eosinophils Relative: 1 %
HCT: 38.3 % — ABNORMAL LOW (ref 39.0–52.0)
Hemoglobin: 13 g/dL (ref 13.0–17.0)
Immature Granulocytes: 0 %
Lymphocytes Relative: 37 %
Lymphs Abs: 1.3 10*3/uL (ref 0.7–4.0)
MCH: 30.2 pg (ref 26.0–34.0)
MCHC: 33.9 g/dL (ref 30.0–36.0)
MCV: 88.9 fL (ref 80.0–100.0)
Monocytes Absolute: 0.5 10*3/uL (ref 0.1–1.0)
Monocytes Relative: 15 %
Neutro Abs: 1.6 10*3/uL — ABNORMAL LOW (ref 1.7–7.7)
Neutrophils Relative %: 46 %
Platelets: 301 10*3/uL (ref 150–400)
RBC: 4.31 MIL/uL (ref 4.22–5.81)
RDW: 17.9 % — ABNORMAL HIGH (ref 11.5–15.5)
WBC: 3.5 10*3/uL — ABNORMAL LOW (ref 4.0–10.5)
nRBC: 0 % (ref 0.0–0.2)

## 2023-05-26 LAB — BASIC METABOLIC PANEL
Anion gap: 19 — ABNORMAL HIGH (ref 5–15)
BUN: 16 mg/dL (ref 8–23)
CO2: 23 mmol/L (ref 22–32)
Calcium: 8.6 mg/dL — ABNORMAL LOW (ref 8.9–10.3)
Chloride: 86 mmol/L — ABNORMAL LOW (ref 98–111)
Creatinine, Ser: 1.21 mg/dL (ref 0.61–1.24)
GFR, Estimated: >60 mL/min (ref >60)
Glucose, Bld: 91 mg/dL (ref 70–99)
Potassium: 3.7 mmol/L (ref 3.5–5.1)
Sodium: 128 mmol/L — ABNORMAL LOW (ref 135–145)

## 2023-05-26 LAB — TYPE AND SCREEN
ABO/RH(D): B NEG
Antibody Screen: NEGATIVE

## 2023-05-26 LAB — CBG MONITORING, ED: Glucose-Capillary: 97 mg/dL (ref 70–99)

## 2023-05-26 LAB — LACTIC ACID, PLASMA: Lactic Acid, Venous: 1.8 mmol/L (ref 0.5–1.9)

## 2023-05-26 LAB — ETHANOL: Alcohol, Ethyl (B): 140 mg/dL — ABNORMAL HIGH (ref <10)

## 2023-05-26 LAB — BRAIN NATRIURETIC PEPTIDE: B Natriuretic Peptide: 1249 pg/mL — ABNORMAL HIGH (ref 0.0–100.0)

## 2023-05-26 LAB — TROPONIN I (HIGH SENSITIVITY): Troponin I (High Sensitivity): 15 ng/L (ref <18)

## 2023-05-26 MED ORDER — SODIUM CHLORIDE 0.9 % IV BOLUS
1000.0000 mL | Freq: Once | INTRAVENOUS | Status: AC
  Administered 2023-05-26: 1000 mL via INTRAVENOUS

## 2023-05-26 MED ORDER — NOREPINEPHRINE 4 MG/250ML-% IV SOLN: 2.0000 ug/min | INTRAVENOUS | Status: DC

## 2023-05-26 MED ORDER — SODIUM CHLORIDE 0.9 % IV SOLN: 250.0000 mL | INTRAVENOUS | Status: DC

## 2023-05-26 NOTE — ED Notes (Signed)
Phleb contacted for blood work

## 2023-05-26 NOTE — ED Notes (Signed)
DC instructions reviewed with pt. PT verbalized understanding. PT DC °

## 2023-05-26 NOTE — ED Triage Notes (Signed)
Pt came in POV to see doc for follow up after recent admission and was sent here for hypotension

## 2023-05-26 NOTE — Progress Notes (Signed)
PCP: [name], FNP Cardiology: Dr. Bjorn Pippin HF Cardiology: Dr. Shirlee Latch  67 y.o. with history of ETOH abuse and prior HTN returns for followup of nonischemic cardiomyopathy. Patient had no known prior cardiac history.  In 2/21, he developed exertional dyspnea and was noted at an urgent care center to have CHF.  Echo in 3/21 showed EF < 20% with severe RV dysfunction.  RHC/LHC in 3/21 showed no CAD but R > L filling pressure elevation and preserved cardiac index.  He was admitted in 4/21 with acute/chronic systolic CHF.  He was diuresed and cardiac meds were adjusted.  Cardiac MRI this admission showed EF 14%, moderate LV dilation, severely decreased RV function, and moderate pericardial effusion. There was no evidence for myocarditis noted.   Patient, of note, has a history of heavy ETOH intake for decades. Per his wife, he would drink heavily every day after getting home from work and especially heavily on the weekend. He has a history of ETOH withdrawal.  He does have some family history of CHF (sister, maybe his mother), but he does not have a lot of detail about these diagnoses. No drugs (cocaine, amphetamines).   Echo in 8/21 showed EF 30-35%, diffuse hypokinesis, normal RV, no pericardial effusion. Echo in 10/21 showed EF 45-50% with global hypokinesis.  Echo in 3/22 with EF 45-50%, normal RV, PASP 34 mmHg. TEE in 5/23 showed EF 50-55%, normal RV, trivial MR, mobile RA structure seen on TTE was [facility].   Follow up 4/24, NYHA I-II and volume stable.   Admitted 6/24 with weakness, 2/2 N/V/D x several days prior. Ethanol level 38. Na 118, nephrology consulted and he received IVF, GDMT held. He continued to drink ETOH while admitted, sustained a fall during admission and hit his head, CT head negative. He was discharged home on prior GDMT except spiro, weight 150 lbs.  Today he returns for HF follow up with his wife. Overall feeling fine. He is not SOB walking on flat ground or walking up  steps. Denies CP, dizziness, edema, or PND/Orthopnea. Appetite ok. No fever or chills. Weight at home 155-160 pounds. He does not know what medications he is taking. No tobacco use, drinks 2 40's/day. Wife is worried about his drinking and he has been unsteady on his feet recently.  ECG (personally reviewed):  SR with 1AVB PR 210 msec, QTc 501 msec  Labs (4/21): HIV negative, K 4.2, creatinine 0.93 Labs (5/21): K 4.2, creatinine 1.23, digoxin 0.6 Labs (7/21): K 3.8, creatinine 1.49 Labs (10/21): K 4.2, creatinine 1.25, BNP 14 Labs (12/21): K 4.3, creatinine 1.14 Labs (3/22): K 4.4, creatinine 1.34 Labs (7/22): K 3.9, creatinine 1.19 Labs (3/23): K 4.6, creatinine 1.29 Labs (1/24): LDL 48, K 4.2, creatinine 9.14 Labs (6/24): K 3.2, creatinine 1.11  PMH: 1. Prostate cancer 2. HTN: BP now low.  3. Fe deficiency anemia 4. ETOH abuse 5. GERD 6. Chronic systolic CHF: Nonischemic cardiomyopathy.  - Echo 3/21 with EF < 20%, severe global HK, severely dilated and severely dysfunctional RV, severe biatrial enlargement.  - RHC/LHC (3/21): No CAD; mean RA 15, PA 51/24, mean PCWP 19, CI 3.96.  - HIV negative 4/21 - Cardiac MRI (4/21): LV EF 14%, moderate LV dilation, mild RV dilation with severely decreased systolic function, moderate pericardial effusion, no LGE noted but images difficult due to recent Fe infusion.  - Echo (8/21): EF 30-35%, diffuse hypokinesis, normal RV, no pericardial fluid.  - Echo (10/21): EF 45-50%, diffuse hypokinesis, normal RV.  -  Echo (3/22): EF 45-50%, normal RV, PASP 34 mmHg.  - TEE (5/23): EF 50-55%, normal RV, trivial MR, mobile RA structure seen on TTE was [facility].   FH: Sister with CHF, brother with pacemaker, mother with CHF.   SH: Married, lives in [place], heavy ETOH in the past has now cut back, no drugs.  He was a Estate agent.   ROS: All systems reviewed and negative except as per HPI.   No current facility-administered medications for  this encounter.   Current Outpatient Medications  Medication Sig Dispense Refill   empagliflozin (JARDIANCE) 10 MG TABS tablet Take 1 tablet (10 mg total) by mouth daily before breakfast. 90 tablet 3   magnesium oxide (MAG-OX) 400 MG tablet Take 1 tablet (400 mg total) by mouth 2 (two) times daily. 14 tablet 0   metoprolol succinate (TOPROL XL) 25 MG 24 hr tablet Take 0.5 tablets (12.5 mg total) by mouth daily. 30 tablet 0   Multiple Vitamin (MULTIVITAMIN) tablet Take 1 tablet by mouth daily.     torsemide (DEMADEX) 20 MG tablet Take 1 tablet (20 mg total) by mouth daily. 30 tablet 0   Facility-Administered Medications Ordered in Other Encounters  Medication Dose Route Frequency Provider Last Rate Last Admin   0.9 %  sodium chloride infusion  250 mL Intravenous Continuous Ernie Avena, MD       norepinephrine (LEVOPHED) 4mg  in (0.016 mg/mL) premix infusion  2-10 mcg/min Intravenous Titrated Ernie Avena, MD       Wt Readings from Last 3 Encounters:  05/26/23 65.3 kg (144 lb)  05/19/23 68.3 kg (150 lb 9.2 oz)  03/18/23 69.9 kg (154 lb)   BP (!) 65/54   Pulse 74   Wt 65.3 kg (144 lb)   SpO2 98%   BMI 21.27 kg/m  Physical Exam General:  NAD. No resp difficulty, walked into clinic HEENT: Normal Neck: Supple. No JVD. Carotids 2+ bilat; no bruits. No lymphadenopathy or thryomegaly appreciated. Cor: PMI nondisplaced. Regular rate & rhythm. No rubs, gallops or murmurs. Lungs: Clear Abdomen: Soft, nontender, nondistended. No hepatosplenomegaly. No bruits or masses. Good bowel sounds. Extremities: No cyanosis, clubbing, rash, edema Neuro: Alert & oriented x 3, cranial nerves grossly intact. Moves all 4 extremities w/o difficulty. Appears inebriated.  Assessment/Plan: 1. Hypotension: Several attempts by 3 people to obtain BP. BP 65/54. Wife and sister say he has been having GI upset, concern for hypovolemia. He is unsure of medication he has been taking. He continues to drink ETOH  heavily. QTc 501 msec on ECG today. - Advised eval in ED. He and wife agreeable. Taken to ED via wheelchair. 2. Chronic systolic CHF: Nonischemic cardiomyopathy.  Echo from 3/21 showed EF 20% with moderately decreased RV systolic function by my read.  RHC/LHC from 3/21 showed no significant coronary disease, preserved cardiac output, R>L heart failure.  Cardiac MRI in 4/21 showed EF 14%, severe RV dysfunction, no LGE.  HIV negative.  Patient has an extensive history of heavy ETOH intake; it is very possible that this is ETOH cardiomyopathy.  No LGE noted on cardiac MRI but difficult LGE images, cannot rule out prior myocarditis.  Family history is not fully defined.  He has not been noted to have frequent PVCs.  Echo in 10/21 showed EF improved to 45-50%, echo in 4/22 showed stable EF 45-50%.  TEE in 5/23 showed EF 50-55%, normal RV, trivial MR, mobile RA structure seen on TTE was [facility] network.  NYHA class I-II.  He  is not volume overloaded, appears dry today. - With increased ETOH use, stop Jardiance and torsemide. Needs labs today.  - Continue Entresto 97/103 bid.  - Continue Toprol XL 12.5 mg daily.   - Repeat echo in a few months. - He needs to keep ETOH minimal.   3. ETOH abuse: Long history of heavy ETOH.  This may be the cause of his cardiomyopathy.   - He drinks 2 40 oz beers/day, appears drunk at visit today. - Imperative he cut back, ideally quit.  4. SDOH: sister helps with medications, he does not know what he is taking. - Consider paramedicine.  Needs eval in ED for hypotension. Hold all GDMT for now until we can sort out his medications. Follow up in 2 weeks with APP for med check, ideally arrange for paramedicine.   Anderson [facility] FNP-BC 05/26/2023

## 2023-05-26 NOTE — ED Provider Notes (Signed)
Patient care signed out to follow-up troponin result and reassess blood pressure.  Patient was sent in for hypotension.  Chronic alcohol use.  Patient received IV fluids and symptoms and blood pressure normalized.  Patient well-appearing and has no signs or symptoms on reassessment.  Discussed observation due to complex medical history, significant low blood pressure however patient strongly declines.  Patient has capacity make decisions and said he will return for any new or worsening signs or symptoms.  Patient requesting discharge at this time.   Blane Ohara, MD 05/26/23 [phone]

## 2023-05-26 NOTE — Patient Instructions (Signed)
Thank you for coming in today  If you had labs drawn today, any labs that are abnormal the clinic will call you No news is good news  Medications:   Follow up appointments:  Your physician recommends that you schedule a follow-up appointment in:     Do the following things EVERYDAY: Weigh yourself in the morning before breakfast. Write it down and keep it in a log. Take your medicines as prescribed Eat low salt foods--Limit salt (sodium) to 2000 mg per day.  Stay as active as you can everyday Limit all fluids for the day to less than 2 liters   At the [facility], you and your health needs are our priority. As part of our continuing mission to provide you with exceptional heart care, we have created designated Provider Care Teams. These Care Teams include your primary Cardiologist (physician) and Advanced Practice Providers (APPs- Physician Assistants and Nurse Practitioners) who all work together to provide you with the care you need, when you need it.   You may see any of the following providers on your designated Care Team at your next follow up: Dr Daniel Bensimhon Dr Dalton McLean Dr. Aditya Sabharwal Amy Clegg, NP Brittainy Simmons, PA Jessica Milford,NP Lindsay Finch, PA Alma Diaz, NP Lauren Kemp, PharmD   Please be sure to bring in all your medications bottles to every appointment.    Thank you for choosing [facility]  If you have any questions or concerns before your next appointment please send us a message through mychart or call our office at [phone].    TO LEAVE A MESSAGE FOR THE NURSE SELECT OPTION 2, PLEASE LEAVE A MESSAGE INCLUDING: YOUR NAME DATE OF BIRTH CALL BACK NUMBER REASON FOR CALL**this is important as we prioritize the call backs  YOU WILL RECEIVE A CALL BACK THE SAME DAY AS LONG AS YOU CALL BEFORE 4:00 PM   

## 2023-05-26 NOTE — ED Provider Notes (Signed)
  [place] EMERGENCY DEPARTMENT AT [facility] Provider Note   CSN: [id number] Arrival date & time: 05/26/23  1033     History {Add pertinent medical, surgical, social history, OB history to HPI:1} No chief complaint on file.   Austin [name] is a 67 y.o. male.  HPI     Home Medications Prior to Admission medications   Medication Sig Start Date End Date Taking? Authorizing Provider  empagliflozin (JARDIANCE) 10 MG TABS tablet Take 1 tablet (10 mg total) by mouth daily before breakfast. 02/22/22   Laurey Morale, MD  magnesium oxide (MAG-OX) 400 MG tablet Take 1 tablet (400 mg total) by mouth 2 (two) times daily. 05/20/23   Berton Mount I, MD  metoprolol succinate (TOPROL XL) 25 MG 24 hr tablet Take 0.5 tablets (12.5 mg total) by mouth daily. 05/20/23 07/20/23  Barnetta Chapel, MD  Multiple Vitamin (MULTIVITAMIN) tablet Take 1 tablet by mouth daily.    [provider]  torsemide (DEMADEX) 20 MG tablet Take 1 tablet (20 mg total) by mouth daily. 05/20/23   Barnetta Chapel, MD      Allergies    Latex    Review of Systems   Review of Systems  Physical Exam Updated Vital Signs BP (!) 71/61 (BP Location: Left Arm)  Physical Exam  ED Results / Procedures / Treatments   Labs (all labs ordered are listed, but only abnormal results are displayed) Labs Reviewed - No data to display  EKG None  Radiology No results found.  Procedures Procedures  {Document cardiac monitor, telemetry assessment procedure when appropriate:1}  Medications Ordered in ED Medications - No data to display  ED Course/ Medical Decision Making/ A&P   {   Click here for ABCD2, HEART and other calculatorsREFRESH Note before signing :1}                          Medical Decision Making  ***  {Document critical care time when appropriate:1} {Document review of labs and clinical decision tools ie heart score, Chads2Vasc2 etc:1}  {Document your independent review  of radiology images, and any outside records:1} {Document your discussion with family members, caretakers, and with consultants:1} {Document social determinants of health affecting pt's care:1} {Document your decision making why or why not admission, treatments were needed:1} Final Clinical Impression(s) / ED Diagnoses Final diagnoses:  None    Rx / DC Orders ED Discharge Orders     None

## 2023-05-26 NOTE — Progress Notes (Deleted)
Hershal Coria Kylar Leonhardt,acting as a Neurosurgeon for [name], FNP.,have documented all relevant documentation on the behalf of [name], FNP,as directed by  [name], FNP while in the presence of [name], FNP.  Subjective:  Patient ID: Austin [name] , male    DOB: Jan 12, 1956 , 67 y.o.   MRN: [id number]  No chief complaint on file.   HPI  Patient presents today for a hospital follow up, patient was discharged on 05/20/2023, Patient went to the hospital for Hyponatremia.  Patient reports today he is feeling     Past Medical History:  Diagnosis Date  . Arthritis    hands  . CHF (congestive heart failure) (HCC)   . GERD (gastroesophageal reflux disease)   . Hypertension   . Hypomagnesemia   . Hyponatremia   . Prostate cancer [facility])      Family History  Problem Relation Age of Onset  . Hypertension Mother   . Hypertension Father      Current Outpatient Medications:  .  empagliflozin (JARDIANCE) 10 MG TABS tablet, Take 1 tablet (10 mg total) by mouth daily before breakfast., Disp: 90 tablet, Rfl: 3 .  magnesium oxide (MAG-OX) 400 MG tablet, Take 1 tablet (400 mg total) by mouth 2 (two) times daily., Disp: 14 tablet, Rfl: 0 .  metoprolol succinate (TOPROL XL) 25 MG 24 hr tablet, Take 0.5 tablets (12.5 mg total) by mouth daily., Disp: 30 tablet, Rfl: 0 .  Multiple Vitamin (MULTIVITAMIN) tablet, Take 1 tablet by mouth daily., Disp: , Rfl:  .  torsemide (DEMADEX) 20 MG tablet, Take 1 tablet (20 mg total) by mouth daily., Disp: 30 tablet, Rfl: 0   Allergies  Allergen Reactions  . Latex Swelling and Rash     Review of Systems   There were no vitals filed for this visit. There is no height or weight on file to calculate BMI.  Wt Readings from Last 3 Encounters:  05/19/23 150 lb 9.2 oz (68.3 kg)  03/18/23 154 lb (69.9 kg)  12/18/22 155 lb (70.3 kg)    The ASCVD Risk score (Arnett DK, et al., 2019) failed to calculate for the following reasons:   The valid HDL cholesterol  range is 20 to 100 mg/dL  Objective:  Physical Exam      Assessment And Plan:  1. Hospital discharge follow-up    No follow-ups on file.  Patient was given opportunity to ask questions. Patient verbalized understanding of the plan and was able to repeat key elements of the plan. All questions were answered to their satisfaction.  [name], FNP  I, [name], FNP, have reviewed all documentation for this visit. The documentation on 05/26/23 for the exam, diagnosis, procedures, and orders are all accurate and complete.   IF YOU HAVE BEEN REFERRED TO A SPECIALIST, IT MAY TAKE 1-2 WEEKS TO SCHEDULE/PROCESS THE REFERRAL. IF YOU HAVE NOT HEARD FROM US/SPECIALIST IN TWO WEEKS, PLEASE GIVE [place] A CALL AT [phone].

## 2023-05-26 NOTE — Telephone Encounter (Signed)
Called wife after seeing the patient seen Cardiology today and was sent back to the ER due to hypotension. He has just had his blood drawn and is awaiting disposition. She will let us know if he is discharged so he can have a hospital f/u.

## 2023-05-26 NOTE — Discharge Instructions (Signed)
Follow-up with your primary doctor and/or your cardiologist next 48 hours for recheck.  If you feel lightheaded, passout, chest pain, shortness of breath, fevers return the emergency room.

## 2023-05-27 ENCOUNTER — Telehealth: Payer: Self-pay

## 2023-05-27 ENCOUNTER — Telehealth (HOSPITAL_COMMUNITY): Payer: Self-pay | Admitting: *Deleted

## 2023-05-27 ENCOUNTER — Ambulatory Visit: Payer: Medicare Other | Admitting: Nurse Practitioner

## 2023-05-27 MED ORDER — ENTRESTO 97-103 MG PO TABS: 1.0000 | ORAL_TABLET | Freq: Two times a day (BID) | ORAL | 6 refills | Status: DC

## 2023-05-27 NOTE — Telephone Encounter (Signed)
Spoke w/pt's wife, she is aware, agreeable, and verbalized understanding 

## 2023-05-27 NOTE — Progress Notes (Deleted)
[name], CMA,acting as a Neurosurgeon for [name], FNP.,have documented all relevant documentation on the behalf of [name], FNP,as directed by  [name], FNP while in the presence of [name], FNP.  Subjective:  Patient ID: Austin [name] , male    DOB: Apr 28, 1956 , 67 y.o.   MRN: [id number]  No chief complaint on file.   HPI  Patient presents today for a hospital follow up, patient went to the hospital on 05/26/2023 for transient hypotension at [facility]. Today patient reports he feels    Past Medical History:  Diagnosis Date  . Arthritis    hands  . CHF (congestive heart failure) (HCC)   . GERD (gastroesophageal reflux disease)   . Hypertension   . Hypomagnesemia   . Hyponatremia   . Prostate cancer [facility])      Family History  Problem Relation Age of Onset  . Hypertension Mother   . Hypertension Father      Current Outpatient Medications:  .  empagliflozin (JARDIANCE) 10 MG TABS tablet, Take 1 tablet (10 mg total) by mouth daily before breakfast., Disp: 90 tablet, Rfl: 3 .  magnesium oxide (MAG-OX) 400 MG tablet, Take 1 tablet (400 mg total) by mouth 2 (two) times daily., Disp: 14 tablet, Rfl: 0 .  metoprolol succinate (TOPROL XL) 25 MG 24 hr tablet, Take 0.5 tablets (12.5 mg total) by mouth daily., Disp: 30 tablet, Rfl: 0 .  Multiple Vitamin (MULTIVITAMIN) tablet, Take 1 tablet by mouth daily., Disp: , Rfl:  .  torsemide (DEMADEX) 20 MG tablet, Take 1 tablet (20 mg total) by mouth daily., Disp: 30 tablet, Rfl: 0   Allergies  Allergen Reactions  . Latex Swelling and Rash     Review of Systems   There were no vitals filed for this visit. There is no height or weight on file to calculate BMI.  Wt Readings from Last 3 Encounters:  05/26/23 144 lb (65.3 kg)  05/19/23 150 lb 9.2 oz (68.3 kg)  03/18/23 154 lb (69.9 kg)    The ASCVD Risk score (Arnett DK, et al., 2019) failed to calculate for the following reasons:   The valid HDL cholesterol  range is 20 to 100 mg/dL  Objective:  Physical Exam      Assessment And Plan:  1. Hospital discharge follow-up    No follow-ups on file.  Patient was given opportunity to ask questions. Patient verbalized understanding of the plan and was able to repeat key elements of the plan. All questions were answered to their satisfaction.  [name], FNP  I, [name], FNP, have reviewed all documentation for this visit. The documentation on 05/27/23 for the exam, diagnosis, procedures, and orders are all accurate and complete.   IF YOU HAVE BEEN REFERRED TO A SPECIALIST, IT MAY TAKE 1-2 WEEKS TO SCHEDULE/PROCESS THE REFERRAL. IF YOU HAVE NOT HEARD FROM US/SPECIALIST IN TWO WEEKS, PLEASE GIVE [place] A CALL AT [phone] X [phone].

## 2023-05-27 NOTE — Telephone Encounter (Signed)
Patient called to reschedule his appointment today on 05/27/2023, patient reports he is just tired and wants to rest his body. Patient was rescheduled for  06/05/2023 at 3pm.

## 2023-05-29 NOTE — Progress Notes (Signed)
error 

## 2023-06-02 ENCOUNTER — Telehealth: Payer: Self-pay

## 2023-06-02 NOTE — Telephone Encounter (Signed)
Transition Care Management Unsuccessful Follow-up Telephone Call  Date of discharge and from where:   6/17  Attempts:  1st Attempt  Reason for unsuccessful TCM follow-up call:  Left voice message   Ardella Chhim [facility], [place] [phone] [address], [place], [place] [address] Phone: [phone] Email: [email or web address]       

## 2023-06-03 ENCOUNTER — Telehealth: Payer: Self-pay

## 2023-06-03 NOTE — Telephone Encounter (Signed)
Transition Care Management Unsuccessful Follow-up Telephone Call  Date of discharge and from where:  [facility] 6/17  Attempts:  2nd Attempt  Reason for unsuccessful TCM follow-up call:  Left voice message   Mckell Riecke [facility], [place] [phone] [address], [place], [place] [address] Phone: [phone] Email: [email or web address]       

## 2023-06-05 ENCOUNTER — Ambulatory Visit: Payer: Medicare Other | Admitting: Nurse Practitioner

## 2023-06-05 ENCOUNTER — Encounter: Payer: Self-pay | Admitting: Nurse Practitioner

## 2023-06-05 VITALS — BP 104/68 | HR 45 | Temp 97.5°F | Ht 69.0 in | Wt 141.4 lb

## 2023-06-05 DIAGNOSIS — F1090 Alcohol use, unspecified, uncomplicated: Secondary | ICD-10-CM | POA: Diagnosis not present

## 2023-06-05 DIAGNOSIS — Z8679 Personal history of other diseases of the circulatory system: Secondary | ICD-10-CM | POA: Diagnosis not present

## 2023-06-05 DIAGNOSIS — I5042 Chronic combined systolic (congestive) and diastolic (congestive) heart failure: Secondary | ICD-10-CM | POA: Diagnosis not present

## 2023-06-05 DIAGNOSIS — I959 Hypotension, unspecified: Secondary | ICD-10-CM | POA: Diagnosis not present

## 2023-06-05 DIAGNOSIS — R7989 Other specified abnormal findings of blood chemistry: Secondary | ICD-10-CM

## 2023-06-05 DIAGNOSIS — I110 Hypertensive heart disease with heart failure: Secondary | ICD-10-CM | POA: Diagnosis not present

## 2023-06-05 MED ORDER — THIAMINE HCL 100 MG PO TABS
100.0000 mg | ORAL_TABLET | Freq: Every day | ORAL | 1 refills | Status: DC
Start: 2023-06-05 — End: 2023-07-22

## 2023-06-05 NOTE — Patient Instructions (Signed)
Hypotension As your heart beats, it forces blood through your body. This force is called blood pressure. If you have hypotension, you have low blood pressure.  When your blood pressure is too low, you may not get enough blood to your brain or other parts of your body. This may cause you to feel weak, light-headed, have a fast heartbeat, or even faint. Low blood pressure may be harmless, or it may cause serious problems. What are the causes? Blood loss. Not enough water in the body (dehydration). Heart problems. Hormone problems. Pregnancy. A very bad infection. Not having enough of certain nutrients. Very bad allergic reactions. Certain medicines. What increases the risk? Age. The risk increases as you get older. Conditions that affect the heart or the brain and spinal cord (central nervous system). What are the signs or symptoms? Feeling: Weak. Light-headed. Dizzy. Tired (fatigued). Blurred vision. Fast heartbeat. Fainting, in very bad cases. How is this treated? Changing your diet. This may involve drinking more water or including more salt (sodium) in your diet by eating high-salt foods. Taking medicines to raise your blood pressure. Changing how much you take (the dosage) of some of your medicines. Wearing compression stockings. These stockings help to prevent blood clots and reduce swelling in your legs. In some cases, you may need to go to the hospital to: Receive fluids through an IV tube. Receive donated blood through an IV tube (transfusion). Get treated for an infection or heart problems, if this applies. Be monitored while medicines that you are taking wear off. Follow these instructions at home: Eating and drinking  Drink enough fluids to keep your pee (urine) pale yellow. Eat a healthy diet. Follow instructions from your doctor about what you can eat or drink. A healthy diet includes: Fresh fruits and vegetables. Whole grains. Low-fat (lean) meats. Low-fat  dairy products. If told, include more salt in your diet. Do not add extra salt to your diet unless your doctor tells you to. Eat small meals often. Avoid standing up quickly after you eat. Medicines Take over-the-counter and prescription medicines only as told by your doctor. Follow instructions from your doctor about changing how much you take of your medicines, if this applies. Do not stop or change any of your medicines on your own. General instructions  Wear compression stockings as told by your doctor. Get up slowly from lying down or sitting. Avoid hot showers and a lot of heat as told by your doctor. Return to your normal activities when your doctor says that it is safe. Do not smoke or use any products that contain nicotine or tobacco. If you need help quitting, ask your doctor. Keep all follow-up visits. Contact a doctor if: You vomit. You have watery poop (diarrhea). You have a fever for more than 2-3 days. You feel more thirsty than normal. You feel weak and tired. Get help right away if: You have chest pain. You have a fast or uneven heartbeat. You lose feeling (have numbness) in any part of your body. You cannot move your arms or your legs. You have trouble talking. You get sweaty or feel light-headed. You faint. You have trouble breathing. You have trouble staying awake. You feel mixed up (confused). These symptoms may be an emergency. Get help right away. Call 911. Do not wait to see if the symptoms will go away. Do not drive yourself to the hospital. Summary Hypotension is also called low blood pressure. It is when the force of blood pumping through your body   is too weak. Hypotension may be harmless, or it may cause serious problems. Treatment may include changing your diet and medicines, and wearing compression stockings. In very bad cases, you may need to go to the hospital. This information is not intended to replace advice given to you by your health care  provider. Make sure you discuss any questions you have with your health care provider. Document Revised: 07/16/2021 Document Reviewed: 07/16/2021 Elsevier Patient Education  2024 Elsevier Inc.  

## 2023-06-05 NOTE — Progress Notes (Signed)
I,Victoria T Deloria Lair, CMA,acting as a Neurosurgeon for [name], FNP.,have documented all relevant documentation on the behalf of [name], FNP,as directed by  [name], FNP while in the presence of [name], FNP.  Subjective:  Patient ID: Austin [name] , male    DOB: November 27, 1956 , 67 y.o.   MRN: [id number]  Chief Complaint  Patient presents with   Follow-up    HPI  Patient presents today for ED follow up. He visited [facility] on 05/26/2023. Discharged on same day. For Transient Hypotension.  Today he reports feeling better. He reports he went to a party. He was drinking a lot of alcohol at the party. He was unable to stop vomiting. He reports he had alcohol yesterday.   Spoke to his wife via phone and she is concerned about his drinking. Reports he is drinking first thing in the morning. He also has fraternity brothers who are concerned about him due to his drinking all day. The patient also shares he goes around the corner to a friend's house.   He reports no specific questions or concerns.        Past Medical History:  Diagnosis Date   Arthritis    hands   CHF (congestive heart failure) (HCC)    GERD (gastroesophageal reflux disease)    Hypertension    Hypomagnesemia    Hyponatremia    Prostate cancer (HCC)      Family History  Problem Relation Age of Onset   Hypertension Mother    Hypertension Father      Current Outpatient Medications:    magnesium oxide (MAG-OX) 400 MG tablet, Take 1 tablet (400 mg total) by mouth 2 (two) times daily. (Patient not taking: Reported on 06/19/2023), Disp: 14 tablet, Rfl: 0   Multiple Vitamin (MULTIVITAMIN) tablet, Take 1 tablet by mouth daily. (Patient not taking: Reported on 06/24/2023), Disp: , Rfl:    sacubitril-valsartan (ENTRESTO) 97-103 MG, Take 1 tablet by mouth 2 (two) times daily., Disp: 60 tablet, Rfl: 6   thiamine (VITAMIN B1) 100 MG tablet, Take 1 tablet (100 mg total) by mouth daily. (Patient not  taking: Reported on 06/10/2023), Disp: 90 tablet, Rfl: 1   empagliflozin (JARDIANCE) 10 MG TABS tablet, Take 1 tablet (10 mg total) by mouth daily before breakfast., Disp: 30 tablet, Rfl: 6   metoprolol succinate (TOPROL XL) 25 MG 24 hr tablet, Take 1.5 tablets (37.5 mg total) by mouth daily. (Patient not taking: Reported on 06/24/2023), Disp: 45 tablet, Rfl: 3   Allergies  Allergen Reactions   Latex Swelling and Rash     Review of Systems  Constitutional: Negative.   HENT: Negative.    Respiratory: Negative.    Cardiovascular: Negative.   Genitourinary: Negative.   Skin: Negative.   Allergic/Immunologic: Negative.   Neurological: Negative.   Hematological: Negative.   Psychiatric/Behavioral: Negative.       Today's Vitals   06/05/23 1431  BP: 104/68  Pulse: (!) 45  Temp: (!) 97.5 F (36.4 C)  SpO2: 98%  Weight: 141 lb 6.4 oz (64.1 kg)  Height: 5\' 9"  (1.753 m)   Body mass index is 20.88 kg/m.  Wt Readings from Last 3 Encounters:  06/24/23 138 lb 9.6 oz (62.9 kg)  06/10/23 142 lb (64.4 kg)  06/05/23 141 lb 6.4 oz (64.1 kg)     Objective:  Physical Exam Vitals reviewed.  Constitutional:      General: He is not in acute distress.    Appearance: Normal  appearance.  Cardiovascular:     Rate and Rhythm: Normal rate and regular rhythm.     Pulses: Normal pulses.     Heart sounds: Normal heart sounds. No murmur heard. Pulmonary:     Effort: Pulmonary effort is normal. No respiratory distress.     Breath sounds: Normal breath sounds. No wheezing.  Musculoskeletal:        General: No swelling or tenderness.     Right lower leg: No edema.     Left lower leg: No edema.  Skin:    General: Skin is warm and dry.     Findings: No rash.  Neurological:     General: No focal deficit present.     Mental Status: He is alert and oriented to person, place, and time.     Cranial Nerves: No cranial nerve deficit.     Motor: No weakness.  Psychiatric:        Mood and Affect: Mood  normal.        Behavior: Behavior normal.        Thought Content: Thought content normal.        Judgment: Judgment normal.         Assessment And Plan:  Transient hypotension Assessment & Plan: TCM Performed. A member of the clinical team spoke with the patient upon dischare. Discharge summary was reviewed in full detail during the visit. Meds reconciled and compared to discharge meds. Medication list is updated and reviewed with the patient.  Greater than 50% face to face time was spent in counseling an coordination of care.  All questions were answered to the satisfaction of the patient.   Given IV fluids while hospitalized.  May be related to previous GI upset with diarrhea.   Alcohol use disorder Assessment & Plan: Will make a referral to psychiatry to assist him with alcohol cessation.  I have advised him to strongly quit drinking slowly to avoid withdrawals.  Also discussed this with his wife.  During visit patient was very loud in the office intoxicated at the time of the visit.  Orders: -     Thiamine HCl; Take 1 tablet (100 mg total) by mouth daily. (Patient not taking: Reported on 06/10/2023)  Dispense: 90 tablet; Refill: 1 -     Ambulatory referral to Psychiatry  Hypertensive 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 (HCC) Assessment & Plan: Blood pressure is better controlled.  He had transient hypotension at his recent hospital visit  Orders: -     Brain natriuretic peptide  Elevated brain natriuretic peptide (BNP) level Assessment & Plan: Will recheck BMP, history of congestive heart failure with recent hospitalization was exacerbated.  His levels have improved from his previous hospitalization.  Orders: -     Brain natriuretic peptide  History of congestive heart failure     Return if symptoms worsen or fail to improve, for schedule AWV.  Patient was given opportunity to ask questions. Patient verbalized understanding  of the plan and was able to repeat key elements of the plan. All questions were answered to their satisfaction.  [name], FNP  I, [name], FNP, have reviewed all documentation for this visit. The documentation on 06/05/23 for the exam, diagnosis, procedures, and orders are all accurate and complete.   IF YOU HAVE BEEN REFERRED TO A SPECIALIST, IT MAY TAKE 1-2 WEEKS TO SCHEDULE/PROCESS THE REFERRAL. IF YOU HAVE NOT HEARD FROM US/SPECIALIST IN TWO WEEKS, PLEASE GIVE [place] A CALL AT  [phone].   THE PATIENT IS ENCOURAGED TO PRACTICE SOCIAL DISTANCING DUE TO THE COVID-19 PANDEMIC.

## 2023-06-09 NOTE — Progress Notes (Signed)
ADVANCED HF CLINIC NOTE  PCP: [name], FNP Cardiology: Dr. Bjorn Pippin HF Cardiology: Dr. Shirlee Latch  67 y.o. with history of ETOH abuse and prior HTN, and nonischemic cardiomyopathy.   In 2/21, he developed exertional dyspnea and was noted at an urgent care center to have CHF.  Echo 3/21 EF < 20% with severe RV dysfunction.  RHC/LHC in 3/21 showed no CAD but R > L filling pressure elevation and preserved cardiac index.  He was admitted 4/21 with acute/chronic systolic CHF.  He was diuresed and cardiac meds were adjusted.  Cardiac MRI  4/21 EF 14%, moderate LV dilation, severely decreased RV function, and moderate pericardial effusion. There was no evidence for myocarditis noted.   Patient, of note, has a history of heavy ETOH intake for decades. Per his wife, he would drink heavily every day after getting home from work and especially heavily on the weekend. He has a history of ETOH withdrawal.  He does have some family history of CHF (sister, maybe his mother), but he does not have a lot of detail about these diagnoses. No drugs (cocaine, amphetamines).   Echo 8/21 EF 30-35%, diffuse hypokinesis, normal RV, no pericardial effusion. Echo in 10/21 showed EF 45-50% with global hypokinesis.  Echo in 3/22 with EF 45-50%, normal RV, PASP 34 mmHg. TEE in 5/23 showed EF 50-55%, normal RV, trivial MR, mobile RA structure seen on TTE was [facility].   Follow up 4/24, NYHA I-II and volume stable.   Admitted 6/24 with weakness, 2/2 N/V/D x several days prior. Ethanol level 38. Na 118, nephrology consulted and he received IVF, GDMT held. He continued to drink ETOH while admitted, sustained a fall during admission and hit his head, CT head negative. He was discharged home on prior GDMT except spiro, weight 150 lbs.  Sent to the ED 05/26/23 after AHF follow-up.  At appt patient's BP was 60s/50s.  In the ED SBP in 70s.  Responded well to 1 L IVF, refused to get admitted, sent home with improvement in BP.   Suspect volume depleted with high alcohol intake, ethanol level 140.  Today he returns for post hospital follow up. Overall feeling much better. Denies palpitations, CP, dizziness, edema, or PND/Orthopnea. No SOB. Appetite coming back, wife cooks at home. No fever or chills. Weight at home 144-153 pounds. Taking all medications, however after reviewing them with him he was taking Jardiance (previously stopped) and a full tablet of metoprolol (was supposed to be taking half). Denies ETOH since Friday, no smoking.    ECG (personally reviewed):  ST 110, LBBB (Personally reviewed)    Labs (4/21): HIV negative, K 4.2, creatinine 0.93 Labs (5/21): K 4.2, creatinine 1.23, digoxin 0.6 Labs (7/21): K 3.8, creatinine 1.49 Labs (10/21): K 4.2, creatinine 1.25, BNP 14 Labs (12/21): K 4.3, creatinine 1.14 Labs (3/22): K 4.4, creatinine 1.34 Labs (7/22): K 3.9, creatinine 1.19 Labs (3/23): K 4.6, creatinine 1.29 Labs (1/24): LDL 48, K 4.2, creatinine 1.61 Labs (6/24): K 3.2, creatinine 1.11 Labs (6/24): K3.7, SCR 1.21, BNP 1200, ethanol 140  PMH: 1. Prostate cancer 2. HTN: BP now low.  3. Fe deficiency anemia 4. ETOH abuse 5. GERD 6. Chronic systolic CHF: Nonischemic cardiomyopathy.  - Echo 3/21 with EF < 20%, severe global HK, severely dilated and severely dysfunctional RV, severe biatrial enlargement.  - RHC/LHC (3/21): No CAD; mean RA 15, PA 51/24, mean PCWP 19, CI 3.96.  - HIV negative 4/21 - Cardiac MRI (4/21): LV EF 14%, moderate  LV dilation, mild RV dilation with severely decreased systolic function, moderate pericardial effusion, no LGE noted but images difficult due to recent Fe infusion.  - Echo (8/21): EF 30-35%, diffuse hypokinesis, normal RV, no pericardial fluid.  - Echo (10/21): EF 45-50%, diffuse hypokinesis, normal RV.  - Echo (3/22): EF 45-50%, normal RV, PASP 34 mmHg.  - TEE (5/23): EF 50-55%, normal RV, trivial MR, mobile RA structure seen on TTE was [facility].   FH:  Sister with CHF, brother with pacemaker, mother with CHF.   SH: Married, lives in [place], heavy ETOH in the past has now cut back, no drugs.  He was a Estate agent.   ROS: All systems reviewed and negative except as per HPI.   Current Outpatient Medications  Medication Sig Dispense Refill   magnesium oxide (MAG-OX) 400 MG tablet Take 1 tablet (400 mg total) by mouth 2 (two) times daily. 14 tablet 0   metoprolol succinate (TOPROL XL) 25 MG 24 hr tablet Take 0.5 tablets (12.5 mg total) by mouth daily. 30 tablet 0   Multiple Vitamin (MULTIVITAMIN) tablet Take 1 tablet by mouth daily.     sacubitril-valsartan (ENTRESTO) 97-103 MG Take 1 tablet by mouth 2 (two) times daily. 60 tablet 6   thiamine (VITAMIN B1) 100 MG tablet Take 1 tablet (100 mg total) by mouth daily. (Patient not taking: Reported on 06/10/2023) 90 tablet 1   No current facility-administered medications for this encounter.   Wt Readings from Last 3 Encounters:  06/10/23 64.4 kg (142 lb)  06/05/23 64.1 kg (141 lb 6.4 oz)  05/26/23 65.3 kg (144 lb)   BP 118/80 (BP Location: Left Arm, Patient Position: Sitting)   Pulse (!) 113   Ht 5\' 9"  (1.753 m)   Wt 64.4 kg (142 lb)   SpO2 98%   BMI 20.97 kg/m  Physical Exam General:  well appearing.  No respiratory difficulty. Walked into clinic HEENT: normal Neck: supple. JVD flat. Carotids 2+ bilat; no bruits. No lymphadenopathy or thyromegaly appreciated. Cor: PMI nondisplaced. Regular rate & rhythm. No rubs, gallops or murmurs. Lungs: clear Abdomen: soft, nontender, nondistended. No hepatosplenomegaly. No bruits or masses. Good bowel sounds. Extremities: no cyanosis, clubbing, rash, edema  Neuro: very giggly, alert & oriented x 3, cranial nerves grossly intact. moves all 4 extremities w/o difficulty. Affect pleasant.   Assessment/Plan: 1. Chronic systolic CHF: Nonischemic cardiomyopathy.  Echo 3/21 showed EF 20% with moderately decreased RV systolic function by my read.   RHC/LHC from 3/21 showed no significant coronary disease, preserved cardiac output, R>L heart failure.  Cardiac MRI in 4/21 showed EF 14%, severe RV dysfunction, no LGE.  HIV negative.  Patient has an extensive history of heavy ETOH intake; it is very possible that this is ETOH cardiomyopathy.  No LGE noted on cardiac MRI but difficult LGE images, cannot rule out prior myocarditis.  Family history is not fully defined.  He has not been noted to have frequent PVCs.  Echo in 10/21 showed EF improved to 45-50%, echo in 4/22 showed stable EF 45-50%.  TEE in 5/23 showed EF 50-55%, normal RV, trivial MR, mobile RA structure seen on TTE was [facility].  NYHA class I-II.  He is not volume overloaded, appears euvolemic today.  - Has not needed torsemide.  - Continue Entresto 97/103 bid.  - Increase Toprol XL 25>37.5 mg daily.  - Has been taking Jardiance, ok to continue 10 mg daily. No GU symptoms - Repeat echo in a few months. -  He needs to keep ETOH minimal.   - BMET/BNP today, repeat BMET 1 week 2. ETOH abuse: Long history of heavy ETOH.  This may be the cause of his cardiomyopathy.   - Previously drank 2 40 oz beers/day - Imperative he cut back, ideally quit.  - Says he hasn't drank any alcohol since Friday, congratulated and told him to keep this up.   3. Hx of hypotension: Sent to the ED 10 days ago for hypertension.  BP 60s/50s. Concern for hypovolemia. He was taking his meds but some wrong. QTc 527 msec on ECG today. - resolved in ED with 1L IVF - BP stable today 4. SDOH: sister helps with medications. - Engage HFSW for paramedicine. Has not been taking all meds as prescribed.   Arrange for paramedicine, agreeable. Follow up with PharmD in 3-4 weeks to titrate meds further (try to add back spiro) and with APP in 6-8 weeks.   Alen Bleacher AGACNP-BC  06/10/2023 [facility] [address] Heart and Vascular [place] [address] [phone]  (office) [phone] (fax)

## 2023-06-10 ENCOUNTER — Encounter (HOSPITAL_COMMUNITY): Payer: Self-pay

## 2023-06-10 ENCOUNTER — Ambulatory Visit (HOSPITAL_COMMUNITY)
Admission: RE | Admit: 2023-06-10 | Discharge: 2023-06-10 | Disposition: A | Discharge status: Home / Self Care | Payer: Medicare Other | Source: Ambulatory Visit | Attending: Internal Medicine | Admitting: Internal Medicine

## 2023-06-10 VITALS — BP 118/80 | HR 113 | Ht 69.0 in | Wt 142.0 lb

## 2023-06-10 DIAGNOSIS — F1010 Alcohol abuse, uncomplicated: Secondary | ICD-10-CM | POA: Diagnosis not present

## 2023-06-10 DIAGNOSIS — Z91148 Patient's other noncompliance with medication regimen for other reason: Secondary | ICD-10-CM | POA: Insufficient documentation

## 2023-06-10 DIAGNOSIS — I428 Other cardiomyopathies: Secondary | ICD-10-CM | POA: Insufficient documentation

## 2023-06-10 DIAGNOSIS — Z8249 Family history of ischemic heart disease and other diseases of the circulatory system: Secondary | ICD-10-CM | POA: Diagnosis not present

## 2023-06-10 DIAGNOSIS — I110 Hypertensive heart disease with heart failure: Secondary | ICD-10-CM | POA: Diagnosis not present

## 2023-06-10 DIAGNOSIS — Z139 Encounter for screening, unspecified: Secondary | ICD-10-CM

## 2023-06-10 DIAGNOSIS — I5022 Chronic systolic (congestive) heart failure: Secondary | ICD-10-CM

## 2023-06-10 DIAGNOSIS — Z8679 Personal history of other diseases of the circulatory system: Secondary | ICD-10-CM | POA: Diagnosis not present

## 2023-06-10 DIAGNOSIS — Z79899 Other long term (current) drug therapy: Secondary | ICD-10-CM | POA: Diagnosis not present

## 2023-06-10 LAB — BRAIN NATRIURETIC PEPTIDE: B Natriuretic Peptide: 1585.1 pg/mL — ABNORMAL HIGH (ref 0.0–100.0)

## 2023-06-10 LAB — BASIC METABOLIC PANEL
Anion gap: 12 (ref 5–15)
BUN: 6 mg/dL — ABNORMAL LOW (ref 8–23)
CO2: 26 mmol/L (ref 22–32)
Calcium: 8.9 mg/dL (ref 8.9–10.3)
Chloride: 100 mmol/L (ref 98–111)
Creatinine, Ser: 1.09 mg/dL (ref 0.61–1.24)
GFR, Estimated: >60 mL/min (ref >60)
Glucose, Bld: 90 mg/dL (ref 70–99)
Potassium: 3.6 mmol/L (ref 3.5–5.1)
Sodium: 138 mmol/L (ref 135–145)

## 2023-06-10 MED ORDER — METOPROLOL SUCCINATE ER 25 MG PO TB24: 37.5000 mg | ORAL_TABLET | Freq: Every day | ORAL | 3 refills | Status: DC

## 2023-06-10 MED ORDER — EMPAGLIFLOZIN 10 MG PO TABS: 10.0000 mg | ORAL_TABLET | Freq: Every day | ORAL | 6 refills | Status: DC

## 2023-06-10 NOTE — Patient Instructions (Signed)
Medication Changes:  INCREASE Metoprolol XL to 37.5 mg (1 & 1/2 tabs) Daily  Restart Jardiance 10 mg Daily  Lab Work:  Labs done today, we will call you for abnormal results  Your physician recommends that you return for lab work in: 1-2 weeks  Testing/Procedures:  none  Referrals:  You have been referred to our [facility], they will call you to schedule a visit   Special Instructions // Education:  Do the following things EVERYDAY: Weigh yourself in the morning before breakfast. Write it down and keep it in a log. Take your medicines as prescribed Eat low salt foods--Limit salt (sodium) to 2000 mg per day.  Stay as active as you can everyday Limit all fluids for the day to less than 2 liters   Follow-Up in:  Please follow up with our heart failure pharmacist in 3-4 weeks  Your physician recommends that you schedule a follow-up appointment in: 6-8 weeks    At the [facility], you and your health needs are our priority. We have a designated team specialized in the treatment of Heart Failure. This Care Team includes your primary Heart Failure Specialized Cardiologist (physician), Advanced Practice Providers (APPs- Physician Assistants and Nurse Practitioners), and Pharmacist who all work together to provide you with the care you need, when you need it.   You may see any of the following providers on your designated Care Team at your next follow up:  Dr. Arvilla Meres Dr. Marca Ancona Dr. Marcos Eke, NP Robbie Lis, [place] [facility] [place], [place] Brynda Peon, NP Karle Plumber, PharmD   Please be sure to bring in all your medications bottles to every appointment.   Need to Contact us:  If you have any questions or concerns before your next appointment please send [place] a message through [place] or call our office at [phone].    TO LEAVE A MESSAGE FOR THE NURSE SELECT OPTION 2, PLEASE LEAVE A  MESSAGE INCLUDING: YOUR NAME DATE OF BIRTH CALL BACK NUMBER REASON FOR CALL**this is important as we prioritize the call backs  YOU WILL RECEIVE A CALL BACK THE SAME DAY AS LONG AS YOU CALL BEFORE 4:00 PM

## 2023-06-18 ENCOUNTER — Telehealth (HOSPITAL_COMMUNITY): Payer: Self-pay | Admitting: Licensed Clinical Social Worker

## 2023-06-18 NOTE — Progress Notes (Signed)
Heart and Vascular Care Navigation  06/18/2023  Austin [name] 1956/05/08 [id number]  Reason for Referral: paramedicine referral   Engaged with patient by telephone for initial visit for Heart and Vascular Care Coordination.                                                                                                   Assessment:                                      Paramedicine Initial Assessment:  Housing:  In what kind of housing do you live? House/apt/trailer/shelter? house  Do you rent/pay a mortgage/own? Owns home  Do you live with anyone? No- son sometimes comes to stay  Are you currently worried about losing your housing? no  Social:  What is your current marital status? Married but living separately  Do you have family or friends who live locally? Wife lives nearby and sons as well  Income:  What is your current source of income? Retirement income  Also works part time for [employer] but very little maybe $400/month when working  Insurance:  Are you currently insured? UHC Medicare   Do you have prescription coverage?  Yes- also has [employer] grant  Transportation:  Do you have transportation to your medical appointments? Yes- drives himself or has wife drive him   Daily Health Needs: Do you have a working scale at home? yes  How do you manage your medications at home? Take them and line them up on the counter.  Do you ever take your medications differently than prescribed?  Do you have issues affording your medications? Yes- used too but has grant.  CSW also assisted in applying for LIS- has applied before and been denied but income somewhat different so attempting again.  If yes, has this ever prevented you from obtaining medications? Yes- has not gotten meds since February it sounds like- when reminded he had grant and that it was active he planned to go get today- grant info texted to pt  Do you have any concerns with mobility at home? no  Do  you use any assistive devices at home or have PCS at home? no  Do you have a PCP? [name]  Do you have any trouble reading or writing? no  Do you currently use tobacco products or have recently quit? no  Do you currently see any mental health providers? no  Are there any additional barriers you see to getting the care you need? no    HRT/VAS Care Coordination     Patients Home Cardiology Office Heart Failure Clinic   Outpatient Care Team Community Paramedicine; Social Worker   Living arrangements for the past 2 months Single Family Home   Lives with: Spouse   Patient Current Insurance Coverage Managed Medicare   Patient Has Concern With Paying Medical Bills No   Does Patient Have Prescription Coverage? Yes   Home Assistive Devices/Equipment None       Social History:  SDOH Screenings   Food Insecurity: No Food Insecurity (05/21/2023)  Housing: Low Risk  (05/17/2023)  Transportation Needs: No Transportation Needs (06/18/2023)  Utilities: Not At Risk (06/18/2023)  Alcohol Screen: Low Risk  (12/06/2020)  Depression (PHQ2-9): Low Risk  (05/15/2023)  Financial Resource Strain: Low Risk  (02/27/2022)  Physical Activity: Inactive (02/27/2022)  Social Connections: Socially Integrated (12/06/2020)  Stress: No Stress Concern Present (02/27/2022)  Tobacco Use: Low Risk  (06/10/2023)    SDOH Interventions: Financial Resources:    Retirement income about $1375/month before deductions  Food Insecurity:  Food Insecurity Interventions: Other (Comment) (receives about $199/month)  Housing Insecurity:  Housing Interventions: Intervention Not Indicated  Transportation:   Has a car- can drive or have wife drive     Follow-up plan:    Paramedics to reach out to pt and set up initial visit.  Burna Sis, LCSW Clinical Social Worker [facility] Desk#: [phone] Cell#: [phone]

## 2023-06-18 NOTE — Progress Notes (Signed)
Advanced [facility] Clinic Note   PCP: [name], FNP Cardiology: Dr. Bjorn Pippin HF Cardiology: Dr. Shirlee Latch  HPI:  67 y.o. with history of ETOH abuse and prior HTN, and nonischemic cardiomyopathy.    In 01/2020, he developed exertional dyspnea and was noted at an urgent care center to have CHF.  Echo 02/2020 EF < 20% with severe RV dysfunction.  RHC/LHC in 02/2020 showed no CAD but R > L filling pressure elevation and preserved cardiac index.  He was admitted 03/2020 with acute/chronic systolic CHF.  He was diuresed and cardiac meds were adjusted.  Cardiac MRI  03/2020 EF 14%, moderate LV dilation, severely decreased RV function, and moderate pericardial effusion. There was no evidence for myocarditis noted.    Patient, of note, has a history of heavy ETOH intake for decades. Per his wife, he would drink heavily every day after getting home from work and especially heavily on the weekend. He has a history of ETOH withdrawal.  He does have some family history of CHF (sister, maybe his mother), but he does not have a lot of detail about these diagnoses. No drugs (cocaine, amphetamines).    Echo 07/2020 EF 30-35%, diffuse hypokinesis, normal RV, no pericardial effusion. Echo in 09/2020 showed EF 45-50% with global hypokinesis.  Echo in 02/2021 with EF 45-50%, normal RV, PASP 34 mmHg. TEE in 04/2022 showed EF 50-55%, normal RV, trivial MR, mobile RA structure seen on TTE was [facility].    Follow up 03/2023, NYHA I-II and volume stable.    Admitted 05/2023 with weakness, 2/2 N/V/D x several days prior. Ethanol level 38. Na 118, nephrology consulted and he received IVF, GDMT held. He continued to drink ETOH while admitted, sustained a fall during admission and hit his head, CT head negative. He was discharged home on prior GDMT except spironolactone, weight 150 lbs.   Sent to the ED 05/26/23 after AHF follow-up.  At appt patient's BP was 60s/50s.  In the ED SBP in 70s.  Responded well to 1 L IVF,  refused to get admitted, sent home with improvement in BP.  Suspect volume depleted with high alcohol intake, ethanol level 140.   Returned to [facility] for post hospital follow up 06/10/23. Overall was feeling much better. Denied palpitations, CP, dizziness, edema, or PND/Orthopnea. No SOB. Appetite was coming back, wife cooks at home. No fever or chills. Weight at home was 144-153 pounds. Reported taking all medications, however after reviewing them with him he was taking Jardiance (previously stopped) and a full tablet of metoprolol (was supposed to be taking half). Denied ETOH since Friday, no smoking.    Today he returns to HF clinic for pharmacist medication titration with his wife and Dede (HF paramedic). At last visit with APP, metoprolol succinate was increased to 37.5 mg daily. He was referred for HF paramedicine program. ED visit for hypotension 06/24/23, Entresto decreased to 49/51 mg BID and metoprolol decreased to 25 mg daily. Overall he is feeling well today. Has done well since medication changes were made after recent ED visit for hypotension. He attributes this visit to being dehydrated, as he was out doing yard work and spreading mulch, and was drinking sweet tea and a few beers. His wife helps him check his BP every morning, SBP has been running ~98-110, with a few values lower and higher. No values <90 mmHg. No dizziness or lightheadedness. No CP, palpitations or fatigue. No SOB/DOE with normal activities but notes that he sometimes has to  rest if he is doing too much. He finds it difficult to take breaks. No LEE, PND or orthopnea. Appetite has been a lot better.    HF Medications: Metoprolol succinate 25 mg daily Entresto 49/51 mg BID Jardiance 10 mg daily  Has the patient been experiencing any side effects to the medications prescribed?  no  Does the patient have any problems obtaining medications due to transportation or finances?   No; UHC Medicare and has [employer].    Understanding of regimen: fair Understanding of indications: fair Potential of compliance: fair Patient understands to avoid NSAIDs. Patient understands to avoid decongestants.    Pertinent Lab Values: 06/24/23: Serum creatinine 1.48, BUN 20, Potassium 3.9, Sodium 131  Vital Signs: Weight: 146 lbs Blood pressure: 106/72  Heart rate: 74   Assessment/Plan: 1. Chronic systolic CHF: Nonischemic cardiomyopathy.  Echo 02/2020 showed EF 20% with moderately decreased RV systolic function by my read.  RHC/LHC from 02/2020 showed no significant coronary disease, preserved cardiac output, R>L heart failure.  Cardiac MRI in 03/2020 showed EF 14%, severe RV dysfunction, no LGE.  HIV negative.  Patient has an extensive history of heavy ETOH intake; it is very possible that this is ETOH cardiomyopathy.  No LGE noted on cardiac MRI but difficult LGE images, cannot rule out prior myocarditis.  Family history is not fully defined.  He has not been noted to have frequent PVCs.  Echo in 09/2020 showed EF improved to 45-50%, echo in 03/2021 showed stable EF 45-50%.  TEE in 04/2022 showed EF 50-55%, normal RV, trivial MR, mobile RA structure seen on TTE was [facility].   - NYHA class II.  He is not volume overloaded, appears euvolemic today. Will not make any medication changes today given recent hypotension requiring ED visit and last labs indicating some dehydration. Repeat BMET, BNP today pending.  - Has not needed torsemide. This was discontinued 05/26/23 with increased ETOH use.  - Continue Entresto 49/51 mg BID.  - Continue metoprolol succinate 25 mg daily.  - Continue Jardiance 10 mg daily. No GU symptoms - Consider restarting spironolactone next visit if remains stable.  - Repeat echo in a few months. - He needs to keep ETOH minimal.   2. ETOH abuse: Long history of heavy ETOH.  This may be the cause of his cardiomyopathy.   - Previously drank 2 40 oz beers/day - Imperative he cut back, ideally quit.   3. Hx of hypotension: Sent to the ED 05/2023 for hypotension.  BP 60s/50s. Concern for hypovolemia. He was taking his meds but some wrong. Was also drinking heavily during that time. Resolved in ED with 1L IVF. Repeat ED visit for hypotension 06/24/23. - BP stable today, 106/72 4. SDOH:  - Now enrolled in HF paramedicine program. Dede helps with his pillbox.     Follow up 2 weeks with APP Clinic   Karle Plumber, PharmD, BCPS, BCCP, CPP Heart Failure Clinic Pharmacist [phone]

## 2023-06-19 ENCOUNTER — Other Ambulatory Visit (HOSPITAL_COMMUNITY): Payer: Self-pay | Admitting: Emergency Medicine

## 2023-06-19 ENCOUNTER — Other Ambulatory Visit (HOSPITAL_COMMUNITY): Payer: Medicare Other

## 2023-06-19 NOTE — Progress Notes (Signed)
  The purpose of this visit was to meet briefly w/ the patient to discover what items he had on hand to make sure he's prepared for his initial Paramedicine visit on Tuesday of next week. Discovered that pt had 3 different doses of Entresto in the same bottle.  It is unclear as to how compliant he has or hasn't been.  I am concerned that he is taking multiple different strengths of Entresto.  I went through his bottle and divided out the different strengths and made sure the bottle which was labeled Entresto 97-103 contained only that strength of medication.   Provided him  with a scale and a pill box.  Reviewed items in his pantry and discussed label reading and sodium content in detail.  His wife participated in this visit and is obviously concerned and interested in making sure he gets on the right path to improve his health. I will follow up with [facility] for patients needed refills.    Beatrix Shipper, EMT-Paramedic [phone] 06/20/2023

## 2023-06-20 ENCOUNTER — Telehealth (HOSPITAL_COMMUNITY): Payer: Self-pay | Admitting: Emergency Medicine

## 2023-06-20 ENCOUNTER — Other Ambulatory Visit: Payer: Self-pay

## 2023-06-20 ENCOUNTER — Other Ambulatory Visit (HOSPITAL_COMMUNITY): Payer: Self-pay

## 2023-06-20 NOTE — Telephone Encounter (Signed)
Followed up w/ Austin [name] regarding refills.  He had a question about his Mag. Oxide.  Based on notes from visit on 7/2 w/ Alma Trisha Mangle this med is not listed in his assessment plan.  I advised him for now to take his meds as reconciled  yesterday. Next visit scheduled 7/16 @ 1:00    Bethanie Dicker [phone] 06/20/2023

## 2023-06-24 ENCOUNTER — Telehealth (HOSPITAL_COMMUNITY): Payer: Self-pay | Admitting: Cardiology

## 2023-06-24 ENCOUNTER — Other Ambulatory Visit (HOSPITAL_COMMUNITY): Payer: Self-pay | Admitting: Emergency Medicine

## 2023-06-24 ENCOUNTER — Encounter (HOSPITAL_COMMUNITY): Payer: Self-pay

## 2023-06-24 ENCOUNTER — Emergency Department (HOSPITAL_COMMUNITY)
Admission: EM | Admit: 2023-06-24 | Discharge: 2023-06-24 | Disposition: A | Discharge status: Home / Self Care | Payer: Medicare Other | Attending: Emergency Medicine | Admitting: Emergency Medicine

## 2023-06-24 DIAGNOSIS — E861 Hypovolemia: Secondary | ICD-10-CM | POA: Insufficient documentation

## 2023-06-24 DIAGNOSIS — Z79899 Other long term (current) drug therapy: Secondary | ICD-10-CM | POA: Insufficient documentation

## 2023-06-24 DIAGNOSIS — Y908 Blood alcohol level of 240 mg/100 ml or more: Secondary | ICD-10-CM | POA: Insufficient documentation

## 2023-06-24 DIAGNOSIS — N179 Acute kidney failure, unspecified: Secondary | ICD-10-CM | POA: Diagnosis not present

## 2023-06-24 DIAGNOSIS — R6889 Other general symptoms and signs: Secondary | ICD-10-CM | POA: Diagnosis not present

## 2023-06-24 DIAGNOSIS — I959 Hypotension, unspecified: Secondary | ICD-10-CM | POA: Diagnosis not present

## 2023-06-24 LAB — COMPREHENSIVE METABOLIC PANEL
ALT: 14 U/L (ref 0–44)
AST: 29 U/L (ref 15–41)
Albumin: 2.9 g/dL — ABNORMAL LOW (ref 3.5–5.0)
Alkaline Phosphatase: 76 U/L (ref 38–126)
Anion gap: 10 (ref 5–15)
BUN: 20 mg/dL (ref 8–23)
CO2: 17 mmol/L — ABNORMAL LOW (ref 22–32)
Calcium: 8.1 mg/dL — ABNORMAL LOW (ref 8.9–10.3)
Chloride: 104 mmol/L (ref 98–111)
Creatinine, Ser: 1.48 mg/dL — ABNORMAL HIGH (ref 0.61–1.24)
GFR, Estimated: 52 mL/min — ABNORMAL LOW (ref >60)
Glucose, Bld: 88 mg/dL (ref 70–99)
Potassium: 3.9 mmol/L (ref 3.5–5.1)
Sodium: 131 mmol/L — ABNORMAL LOW (ref 135–145)
Total Bilirubin: 0.4 mg/dL (ref 0.3–1.2)
Total Protein: 6.3 g/dL — ABNORMAL LOW (ref 6.5–8.1)

## 2023-06-24 LAB — CBC WITH DIFFERENTIAL/PLATELET
Abs Immature Granulocytes: 0.01 10*3/uL (ref 0.00–0.07)
Basophils Absolute: 0 10*3/uL (ref 0.0–0.1)
Basophils Relative: 1 %
Eosinophils Absolute: 0.1 10*3/uL (ref 0.0–0.5)
Eosinophils Relative: 2 %
HCT: 32.1 % — ABNORMAL LOW (ref 39.0–52.0)
Hemoglobin: 10.6 g/dL — ABNORMAL LOW (ref 13.0–17.0)
Immature Granulocytes: 0 %
Lymphocytes Relative: 32 %
Lymphs Abs: 1.4 10*3/uL (ref 0.7–4.0)
MCH: 30.6 pg (ref 26.0–34.0)
MCHC: 33 g/dL (ref 30.0–36.0)
MCV: 92.8 fL (ref 80.0–100.0)
Monocytes Absolute: 0.7 10*3/uL (ref 0.1–1.0)
Monocytes Relative: 16 %
Neutro Abs: 2.2 10*3/uL (ref 1.7–7.7)
Neutrophils Relative %: 49 %
Platelets: 160 10*3/uL (ref 150–400)
RBC: 3.46 MIL/uL — ABNORMAL LOW (ref 4.22–5.81)
RDW: 19 % — ABNORMAL HIGH (ref 11.5–15.5)
WBC: 4.3 10*3/uL (ref 4.0–10.5)
nRBC: 0 % (ref 0.0–0.2)

## 2023-06-24 LAB — ETHANOL: Alcohol, Ethyl (B): 264 mg/dL — ABNORMAL HIGH (ref <10)

## 2023-06-24 MED ORDER — SODIUM CHLORIDE 0.9 % IV BOLUS
500.0000 mL | Freq: Once | INTRAVENOUS | Status: AC
  Administered 2023-06-24: 500 mL via INTRAVENOUS

## 2023-06-24 NOTE — ED Triage Notes (Signed)
Pt BIB [facility] from home, community health paramedic was checking on him and noted his bp to be 78/40. Pt is asymptomatic, positive for ETOH. Most recent bp for EMS 98/60. 500 normal saline given PTA. Pt has no complaints at this time.

## 2023-06-24 NOTE — Telephone Encounter (Signed)
[facility] with para medicine called during pts home visit to report low b/p readings Palpable reading 76 Manual 62/40 x 2  Pt is alert, pt is not diaphoretic   Pt has been compliant with jardiance and metoprolol. Has only taken AM dose of entresto 97/103 x 1week    Poor po intake (iced tea and ETOH)  Note OV  7/2   Hx of hypotension: Sent to the ED 10 days ago for hypertension.  BP 60s/50s. Concern for hypovolemia. He was taking his meds but some wrong. QTc 527 msec on ECG today. - resolved in ED with 1L IVF - BP stable today   Please advise

## 2023-06-24 NOTE — Assessment & Plan Note (Addendum)
Will make a referral to psychiatry to assist him with alcohol cessation.  I have advised him to strongly quit drinking slowly to avoid withdrawals.  Also discussed this with his wife.  During visit patient was very loud in the office intoxicated at the time of the visit.

## 2023-06-24 NOTE — Discharge Instructions (Addendum)
You were seen in the emergency department for your low blood pressure.  Your labs did show that your kidney function was slightly abnormal which is likely related to dehydration, probably a combination from being out in the heat and from your alcohol use as this can get you dehydrated.  You should try to cut back on your alcohol use and increase your fluid intake especially with water.  You should follow-up with your cardiologist to have your symptoms rechecked and to see if you need to make any changes to your medications.  Currently they would like you to take metoprolol 37.5 mg daily and Entresto 97-103 and you should make sure that these are the doses that you have at home.  You should return to the emergency department if you are having consistently low blood pressures and you become lightheaded or pass out, you have repetitive vomiting or if you have any other new or concerning symptoms.

## 2023-06-24 NOTE — Assessment & Plan Note (Signed)
TCM Performed. A member of the clinical team spoke with the patient upon dischare. Discharge summary was reviewed in full detail during the visit. Meds reconciled and compared to discharge meds. Medication list is updated and reviewed with the patient.  Greater than 50% face to face time was spent in counseling an coordination of care.  All questions were answered to the satisfaction of the patient.   Given IV fluids while hospitalized.  May be related to previous GI upset with diarrhea.

## 2023-06-24 NOTE — ED Provider Notes (Signed)
[place] EMERGENCY DEPARTMENT AT [facility] Provider Note   CSN: [id number] Arrival date & time: 06/24/23  1428     History  Chief Complaint  Patient presents with   Hypotension    Austin [name] is a 67 y.o. male.  Patient is a 67 year old male with a past medical history of cardiomyopathy and alcohol use presenting to the emergency department with hypotension.  He states that the cardiology home health nurse came to evaluate him today and help him with his medications and took his vital signs and told him that his blood pressure was low.  He states that he was out in the heat this morning moving wood chips but had no lightheadedness or dizziness.  He states that he did drink alcohol last night but has not drink anything yet today.  He denies any nausea, vomiting or diarrhea.  He states that he has been taking his medications prescribed.  The history is provided by the patient and the spouse.       Home Medications Prior to Admission medications   Medication Sig Start Date End Date Taking? Authorizing Provider  empagliflozin (JARDIANCE) 10 MG TABS tablet Take 1 tablet (10 mg total) by mouth daily before breakfast. 06/10/23   Alen Bleacher, NP  magnesium oxide (MAG-OX) 400 MG tablet Take 1 tablet (400 mg total) by mouth 2 (two) times daily. Patient not taking: Reported on 06/19/2023 05/20/23   Berton Mount I, MD  metoprolol succinate (TOPROL XL) 25 MG 24 hr tablet Take 1.5 tablets (37.5 mg total) by mouth daily. Patient not taking: Reported on 06/24/2023 06/10/23   Alen Bleacher, NP  Multiple Vitamin (MULTIVITAMIN) tablet Take 1 tablet by mouth daily. Patient not taking: Reported on 06/24/2023    [provider]  sacubitril-valsartan (ENTRESTO) 97-103 MG Take 1 tablet by mouth 2 (two) times daily. 05/27/23   [place], Anderson Malta, FNP  thiamine (VITAMIN B1) 100 MG tablet Take 1 tablet (100 mg total) by mouth daily. Patient not taking: Reported on 06/10/2023 06/05/23    [name], FNP      Allergies    Latex    Review of Systems   Review of Systems  Physical Exam Updated Vital Signs BP 103/72   Pulse 69   Temp 98.7 F (37.1 C) (Oral)   Resp 14   SpO2 100%  Physical Exam Vitals and nursing note reviewed.  Constitutional:      General: He is not in acute distress.    Appearance: Normal appearance.  HENT:     Head: Normocephalic and atraumatic.     Nose: Nose normal.     Mouth/Throat:     Mouth: Mucous membranes are moist.     Pharynx: Oropharynx is clear.  Eyes:     Extraocular Movements: Extraocular movements intact.     Conjunctiva/sclera: Conjunctivae normal.  Cardiovascular:     Rate and Rhythm: Normal rate and regular rhythm.     Heart sounds: Normal heart sounds.  Pulmonary:     Effort: Pulmonary effort is normal.     Breath sounds: Normal breath sounds.  Abdominal:     General: Abdomen is flat.     Palpations: Abdomen is soft.     Tenderness: There is no abdominal tenderness.  Musculoskeletal:        General: Normal range of motion.     Cervical back: Normal range of motion.  Skin:    General: Skin is warm and dry.  Neurological:  General: No focal deficit present.     Mental Status: He is alert and oriented to person, place, and time.  Psychiatric:        Mood and Affect: Mood normal.        Behavior: Behavior normal.     ED Results / Procedures / Treatments   Labs (all labs ordered are listed, but only abnormal results are displayed) Labs Reviewed  COMPREHENSIVE METABOLIC PANEL - Abnormal; Notable for the following components:      Result Value   Sodium 131 (*)    CO2 17 (*)    Creatinine, Ser 1.48 (*)    Calcium 8.1 (*)    Total Protein 6.3 (*)    Albumin 2.9 (*)    GFR, Estimated 52 (*)    All other components within normal limits  ETHANOL - Abnormal; Notable for the following components:   Alcohol, Ethyl (B) 264 (*)    All other components within normal limits  CBC WITH DIFFERENTIAL/PLATELET -  Abnormal; Notable for the following components:   RBC 3.46 (*)    Hemoglobin 10.6 (*)    HCT 32.1 (*)    RDW 19.0 (*)    All other components within normal limits  CBC WITH DIFFERENTIAL/PLATELET    EKG EKG Interpretation Date/Time:  Tuesday June 24 2023 14:40:23 EDT Ventricular Rate:  66 PR Interval:  196 QRS Duration:  134 QT Interval:  436 QTC Calculation: 457 R Axis:   -48  Text Interpretation: Sinus rhythm Nonspecific IVCD with LAD LVH with secondary repolarization abnormality Anterior ST elevation, probably due to LVH No significant change since last tracing Confirmed by Elayne Snare ([id number]) on 06/24/2023 3:01:01 PM  Radiology No results found.  Procedures Procedures    Medications Ordered in ED Medications  sodium chloride 0.9 % bolus 500 mL (500 mLs Intravenous New Bag/Given 06/24/23 1530)    ED Course/ Medical Decision Making/ A&P Clinical Course as of 06/24/23 1708  Tue Jun 24, 2023  1430 Patient signed out to Dr. Anitra Lauth pending labs and reassessment.  [VK]  1627 Reevaluation, the patient's blood pressure is up to 99 systolic, remains asymptomatic.  Labs are pending. [VK]  1628 Mild AKI and mildly low bicarb consistent with dehydration. [VK]    Clinical Course User Index [VK] Rexford Maus, DO                             Medical Decision Making This patient presents to the ED with chief complaint(s) of hypotension with pertinent past medical history of nonischemic cardiomyopathy, alcohol use which further complicates the presenting complaint. The complaint involves an extensive differential diagnosis and also carries with it a high risk of complications and morbidity.    The differential diagnosis includes dehydration, electrolyte abnormality, medication side effect, intoxication  Additional history obtained: Additional history obtained from spouse Records reviewed cardiology records -metoprolol recently increased from 25-37.5  ED Course and  Reassessment: On patient's arrival to the emergency department he was hypotensive to the 80s systolic but hemodynamically stable, asymptomatic in no acute distress.  EKG on arrival showed normal sinus rhythm without acute ischemic changes.  Patient will be given a 500 cc bolus of fluids with his heart failure history and he did receive some fluids and route by medics.  Who of labs performed to evaluate for dehydration or electrolyte derangement and will be closely reassessed.  Independent labs interpretation:  The following labs were independently interpreted:  mild AKI  Independent visualization of imaging: - N/A      Amount and/or Complexity of Data Reviewed Labs: ordered.          Final Clinical Impression(s) / ED Diagnoses Final diagnoses:  Hypotension due to hypovolemia  AKI (acute kidney injury) [facility])    Rx / DC Orders ED Discharge Orders     None         Rexford Maus, DO 06/24/23 1708

## 2023-06-24 NOTE — ED Provider Notes (Signed)
66y/o male with NICM on entresto and xarelto with EF 50-55% last year as well as alcohol abuse.  Pt was hypotension with home health today but assymptomatic today.  Recently increased metoprolol last month and labs with alcohol of 246 and mild aki with Cr of 1.48 and sodium of 131.  Pt states he has been outside and states not had much fluids.  His initial BP here is 84/57 and after of fluids BP in the low 100's now which is baseline.   CBC without acute changes.  EKG without changes.  5:27 PM Patient is still feeling well and is requesting to go home.  Blood pressure is now in the low 100s which appears to be his baseline.  Patient was able to walk out asymptomatic.  Did discuss with him avoiding the heat, making sure he is drinking something other than caffeinated drinks and beer.  He appears stable for discharge does not appear to need admission or further testing at this time.   Gwyneth Sprout, MD 06/24/23 1728

## 2023-06-24 NOTE — Telephone Encounter (Signed)
Pt aware via [facility]  Recheck at the time of call 70/46

## 2023-06-24 NOTE — Assessment & Plan Note (Signed)
Will recheck BMP, history of congestive heart failure with recent hospitalization was exacerbated.  His levels have improved from his previous hospitalization.

## 2023-06-24 NOTE — Assessment & Plan Note (Signed)
Blood pressure is better controlled.  He had transient hypotension at his recent hospital visit

## 2023-06-24 NOTE — Progress Notes (Signed)
Paramedicine Encounter    Patient ID: Austin [name], male    DOB: [birth year], 67 y.o.   MRN: [id number]   ComplaintsNONE  Assessment A&O x 4, skin W&D w/ good color.   Compliance with meds Did not take any evening dose of Entresto 97-103  Pill box filled Still needs Metoprolol, Entresto, Multi-Vitamin and B1 to complete pill box.   Refills needed Metoprolol, Entresto  Meds changes since last visit NONE    Social changes NONE   BP (!) 70/46 (BP Location: Right Arm, Patient Position: Sitting, Cuff Size: Normal)   Pulse 72   Resp 16   Wt 138 lb 9.6 oz (62.9 kg)   SpO2 98%   BMI 20.47 kg/m  Weight yesterday- Not taken Last visit weight-139.6lb  Pt found to be outside getting ready to put out some bags of mulch in his year.  Pt. A&O x 4, skin W&D w/ good color.  Pt was expecting paramedicine home visit today. Mr. Ladnier w/ strong odor of ETOH present although he denies drinking today.  Wife confirmed over the phone that he had indeed been drinking today.  Pt. Found to be very hypotensive 62/40 was the lowest reading.  He denies dizziness.  He was not altered or diaphoretic.  I expressed my concern over his BP and suggested he go to the ED.  He initially refused.  I contacted [facility] to advise them of his pressure and after they confirmed he needed to go he agreed to let me call 911 for transport.  While waiting for EMS I initiated IV NaCl 20ga. Left posterior forearm to prepare to initiate fluids.  NSR on the monitor.  12-lead w/ LBBB which he has a history of.  He denies chest pain or SOB.  Pt states he drinks a lot of iced tea and, "I pee all the time."  He has not been drinking any water.   No signs of edema noted. During today's visit, I discovered pt. Had not taken any p.m. doses of his Entresto 97-103.   Pt. Care transferred to EMS Unit [facility]70 and transported non-emerg to [facility].  ACTION: Home visit completed  Bethanie Dicker [phone] 06/24/23  Patient  Care Team: [name], FNP as PCP - General (General Practice) Little Ishikawa, MD as PCP - Cardiology (Cardiology)  Patient Active Problem List   Diagnosis Date Noted   Transient hypotension 06/05/2023   Alcohol use disorder 06/05/2023   Hypertensive 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 (HCC) 06/05/2023   Elevated brain natriuretic peptide (BNP) level 06/05/2023   Elevated LFTs 05/18/2023   Prolonged QT interval 05/18/2023   Hyponatremia 05/17/2023   Iron deficiency anemia 09/03/2020   Acute on chronic combined systolic (congestive) and diastolic (congestive) heart failure (HCC) 03/26/2020   NICM (nonischemic cardiomyopathy) (HCC)    ETOH abuse 11/10/2018   Essential hypertension 11/10/2018   Blood in stool 11/10/2018    Current Outpatient Medications:    empagliflozin (JARDIANCE) 10 MG TABS tablet, Take 1 tablet (10 mg total) by mouth daily before breakfast., Disp: 30 tablet, Rfl: 6   sacubitril-valsartan (ENTRESTO) 97-103 MG, Take 1 tablet by mouth 2 (two) times daily., Disp: 60 tablet, Rfl: 6   magnesium oxide (MAG-OX) 400 MG tablet, Take 1 tablet (400 mg total) by mouth 2 (two) times daily. (Patient not taking: Reported on 06/19/2023), Disp: 14 tablet, Rfl: 0   metoprolol succinate (TOPROL XL) 25 MG 24 hr tablet, Take 1.5  tablets (37.5 mg total) by mouth daily. (Patient not taking: Reported on 06/24/2023), Disp: 45 tablet, Rfl: 3   Multiple Vitamin (MULTIVITAMIN) tablet, Take 1 tablet by mouth daily. (Patient not taking: Reported on 06/24/2023), Disp: , Rfl:    thiamine (VITAMIN B1) 100 MG tablet, Take 1 tablet (100 mg total) by mouth daily. (Patient not taking: Reported on 06/10/2023), Disp: 90 tablet, Rfl: 1 Allergies  Allergen Reactions   Latex Swelling and Rash     Social History   Socioeconomic History   Marital status: Married    Spouse name: Not on file   Number of children: 2   Years of education: Not on file    Highest education level: Bachelor's degree (e.g., BA, AB, BS)  Occupational History   Not on file  Tobacco Use   Smoking status: Never   Smokeless tobacco: Never  Vaping Use   Vaping status: Never Used  Substance and Sexual Activity   Alcohol use: Yes    Comment: 1-3 beers daily   Drug use: No   Sexual activity: Yes  Other Topics Concern   Not on file  Social History Narrative   Not on file   Social Determinants of Health   Financial Resource Strain: Low Risk  (02/27/2022)   Overall Financial Resource Strain (CARDIA)    Difficulty of Paying Living Expenses: Not hard at all  Food Insecurity: No Food Insecurity (05/21/2023)   Hunger Vital Sign    Worried About Running Out of Food in the Last Year: Never true    Ran Out of Food in the Last Year: Never true  Transportation Needs: No Transportation Needs (06/18/2023)   PRAPARE - Administrator, Civil Service (Medical): No    Lack of Transportation (Non-Medical): No  Physical Activity: Inactive (02/27/2022)   Exercise Vital Sign    Days of Exercise per Week: 0 days    Minutes of Exercise per Session: 0 min  Stress: No Stress Concern Present (02/27/2022)   [employer] of Occupational Health - Occupational Stress Questionnaire    Feeling of Stress : Only a little  Social Connections: Socially Integrated (12/06/2020)   Social Connection and Isolation Panel [NHANES]    Frequency of Communication with Friends and Family: More than three times a week    Frequency of Social Gatherings with Friends and Family: More than three times a week    Attends Religious Services: More than 4 times per year    Active Member of Clubs or Organizations: Yes    Attends Banker Meetings: More than 4 times per year    Marital Status: Married  Catering manager Violence: Not At Risk (05/17/2023)   Humiliation, Afraid, Rape, and Kick questionnaire    Fear of Current or Ex-Partner: No    Emotionally Abused: No    Physically  Abused: No    Sexually Abused: No    Physical Exam      Future Appointments  Date Time Provider Department Center  07/09/2023 11:00 AM [facility] PHARMACY [facility] None  07/22/2023 10:00 AM [facility] PA/NP [facility] None  08/21/2023 10:00 AM [name], FNP [facility] None

## 2023-06-25 ENCOUNTER — Other Ambulatory Visit (HOSPITAL_COMMUNITY): Payer: Self-pay | Admitting: Emergency Medicine

## 2023-06-25 ENCOUNTER — Telehealth (HOSPITAL_COMMUNITY): Payer: Self-pay | Admitting: Cardiology

## 2023-06-25 MED ORDER — METOPROLOL SUCCINATE ER 25 MG PO TB24: 25.0000 mg | ORAL_TABLET | Freq: Every day | ORAL | 3 refills | Status: DC

## 2023-06-25 MED ORDER — ENTRESTO 49-51 MG PO TABS: 1.0000 | ORAL_TABLET | Freq: Two times a day (BID) | ORAL | 6 refills | Status: DC

## 2023-06-25 NOTE — Telephone Encounter (Signed)
Home visit completed by [facility] with paramedicine B/p 98/70  Recent ER visit for low B/P as well  Per Tonye Becket, NP Decrease entresto to 49/51 mg BID Decrease metoprolol to 25 mg daily   [facility] aware  Med list updated

## 2023-06-25 NOTE — Progress Notes (Signed)
Med change visit only: Dosage change for Entresto to 49-51  BID and Metoprolol 25mg . One daily.    Beatrix Shipper, EMT-Paramedic [phone] 06/25/2023

## 2023-06-25 NOTE — Progress Notes (Signed)
This visit was medication reconciliation only.   Added Metoprolol and muli vitamin to his pill box.    Beatrix Shipper, EMT-Paramedic [phone] 06/25/2023

## 2023-07-01 ENCOUNTER — Other Ambulatory Visit (HOSPITAL_COMMUNITY): Payer: Self-pay | Admitting: Emergency Medicine

## 2023-07-01 NOTE — Progress Notes (Unsigned)
Paramedicine Encounter    Patient ID: Austin [name], male    DOB: Mar 20, 1956, 67 y.o.   MRN: [id number]   Complaints***  Assessment***  Compliance with meds***  Pill box filled***  Refills needed***  Meds changes since last visit***    Social changes***   There were no vitals taken for this visit. Weight yesterday-not taken Last visit weight- 138lb  ACTION: {Paramed Action:[phone]}  Beatrix Shipper, EMT-Paramedic [phone] 07/01/23  Patient Care Team: [name], FNP as PCP - General (General Practice) Little Ishikawa, MD as PCP - Cardiology (Cardiology)  Patient Active Problem List   Diagnosis Date Noted  . Transient hypotension 06/05/2023  . Alcohol use disorder 06/05/2023  . 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 Elevated brain natriuretic peptide (BNP) level 06/05/2023  . Elevated LFTs 05/18/2023  . Prolonged QT interval 05/18/2023  . Hyponatremia 05/17/2023  . Iron deficiency anemia 09/03/2020  . NICM (nonischemic cardiomyopathy) (HCC)   . ETOH abuse 11/10/2018  . Essential hypertension 11/10/2018  . Blood in stool 11/10/2018    Current Outpatient Medications:  .  empagliflozin (JARDIANCE) 10 MG TABS tablet, Take 1 tablet (10 mg total) by mouth daily before breakfast., Disp: 30 tablet, Rfl: 6 .  magnesium oxide (MAG-OX) 400 MG tablet, Take 1 tablet (400 mg total) by mouth 2 (two) times daily. (Patient not taking: Reported on 06/19/2023), Disp: 14 tablet, Rfl: 0 .  metoprolol succinate (TOPROL-XL) 25 MG 24 hr tablet, Take 1 tablet (25 mg total) by mouth daily. Take with or immediately following a meal., Disp: 90 tablet, Rfl: 3 .  Multiple Vitamin (MULTIVITAMIN) tablet, Take 1 tablet by mouth daily., Disp: , Rfl:  .  sacubitril-valsartan (ENTRESTO) 49-51 MG, Take 1 tablet by mouth 2 (two) times daily., Disp: 60 tablet, Rfl: 6 .  thiamine (VITAMIN B1) 100 MG tablet,  Take 1 tablet (100 mg total) by mouth daily. (Patient not taking: Reported on 06/10/2023), Disp: 90 tablet, Rfl: 1 Allergies  Allergen Reactions  . Latex Swelling and Rash     Social History   Socioeconomic History  . Marital status: Married    Spouse name: Not on file  . Number of children: 2  . Years of education: Not on file  . Highest education level: Bachelor's degree (e.g., BA, AB, BS)  Occupational History  . Not on file  Tobacco Use  . Smoking status: Never  . Smokeless tobacco: Never  Vaping Use  . Vaping status: Never Used  Substance and Sexual Activity  . Alcohol use: Yes    Comment: 1-3 beers daily  . Drug use: No  . Sexual activity: Yes  Other Topics Concern  . Not on file  Social History Narrative  . Not on file   Social Determinants of Health   Financial Resource Strain: Low Risk  (02/27/2022)   Overall Financial Resource Strain (CARDIA)   . Difficulty of Paying Living Expenses: Not hard at all  Food Insecurity: No Food Insecurity (05/21/2023)   Hunger Vital Sign   . Worried About Programme researcher, broadcasting/film/video in the Last Year: Never true   . Ran Out of Food in the Last Year: Never true  Transportation Needs: No Transportation Needs (06/18/2023)   PRAPARE - Transportation   . Lack of Transportation (Medical): No   . Lack of Transportation (Non-Medical): No  Physical Activity: Inactive (02/27/2022)   Exercise Vital Sign   . Days of Exercise per Week: 0  days   . Minutes of Exercise per Session: 0 min  Stress: No Stress Concern Present (02/27/2022)   [employer] of Occupational Health - Occupational Stress Questionnaire   . Feeling of Stress : Only a little  Social Connections: Socially Integrated (12/06/2020)   Social Connection and Isolation Panel [NHANES]   . Frequency of Communication with Friends and Family: More than three times a week   . Frequency of Social Gatherings with Friends and Family: More than three times a week   . Attends Religious Services:  More than 4 times per year   . Active Member of Clubs or Organizations: Yes   . Attends Banker Meetings: More than 4 times per year   . Marital Status: Married  Catering manager Violence: Not At Risk (05/17/2023)   Humiliation, Afraid, Rape, and Kick questionnaire   . Fear of Current or Ex-Partner: No   . Emotionally Abused: No   . Physically Abused: No   . Sexually Abused: No    Physical Exam      Future Appointments  Date Time Provider Department Center  07/09/2023 11:00 AM [facility] PHARMACY [facility] None  07/22/2023 10:00 AM [facility] PA/NP [facility] None  08/21/2023 10:00 AM [name], FNP [facility] None

## 2023-07-04 ENCOUNTER — Telehealth: Payer: Self-pay

## 2023-07-04 NOTE — Telephone Encounter (Signed)
Transition Care Management Follow-up Telephone Call Date of discharge and from where: 06/24/2023 [facility] How have you been since you were released from the hospital? Patient is feeling much better. Any questions or concerns? No  Items Reviewed: Did the pt receive and understand the discharge instructions provided? Yes  Medications obtained and verified? Yes  Other? No  Any new allergies since your discharge? No  Dietary orders reviewed? Yes Do you have support at home? Yes   Follow up appointments reviewed:  PCP Hospital f/u appt confirmed? No  Scheduled to see  on  @ . Specialist Hospital f/u appt confirmed? Yes  Scheduled to see  on 07/22/2023 @ [facility]. Are transportation arrangements needed? No  If their condition worsens, is the pt aware to call PCP or go to the Emergency Dept.? Yes Was the patient provided with contact information for the PCP's office or ED? Yes Was to pt encouraged to call back with questions or concerns? Yes  [name]  [facility] Population Health Community Resource Care Guide   ??[email or web address]  ?? [phone]   Website: [email or web address]  [email or web address]

## 2023-07-09 ENCOUNTER — Other Ambulatory Visit (HOSPITAL_COMMUNITY): Payer: Self-pay | Admitting: Emergency Medicine

## 2023-07-09 ENCOUNTER — Ambulatory Visit (HOSPITAL_COMMUNITY)
Admission: RE | Admit: 2023-07-09 | Discharge: 2023-07-09 | Disposition: A | Discharge status: Home / Self Care | Payer: Medicare Other | Source: Ambulatory Visit | Attending: Cardiology | Admitting: Cardiology

## 2023-07-09 VITALS — BP 106/72 | HR 74 | Wt 146.0 lb

## 2023-07-09 DIAGNOSIS — I428 Other cardiomyopathies: Secondary | ICD-10-CM | POA: Diagnosis not present

## 2023-07-09 DIAGNOSIS — F1010 Alcohol abuse, uncomplicated: Secondary | ICD-10-CM | POA: Insufficient documentation

## 2023-07-09 DIAGNOSIS — I5022 Chronic systolic (congestive) heart failure: Secondary | ICD-10-CM | POA: Insufficient documentation

## 2023-07-09 DIAGNOSIS — I110 Hypertensive heart disease with heart failure: Secondary | ICD-10-CM | POA: Diagnosis not present

## 2023-07-09 DIAGNOSIS — Z8249 Family history of ischemic heart disease and other diseases of the circulatory system: Secondary | ICD-10-CM | POA: Insufficient documentation

## 2023-07-09 DIAGNOSIS — Z79899 Other long term (current) drug therapy: Secondary | ICD-10-CM | POA: Diagnosis not present

## 2023-07-09 LAB — BASIC METABOLIC PANEL
Anion gap: 13 (ref 5–15)
BUN: 15 mg/dL (ref 8–23)
CO2: 21 mmol/L — ABNORMAL LOW (ref 22–32)
Calcium: 9 mg/dL (ref 8.9–10.3)
Chloride: 98 mmol/L (ref 98–111)
Creatinine, Ser: 1.15 mg/dL (ref 0.61–1.24)
GFR, Estimated: >60 mL/min (ref >60)
Glucose, Bld: 82 mg/dL (ref 70–99)
Potassium: 4.4 mmol/L (ref 3.5–5.1)
Sodium: 132 mmol/L — ABNORMAL LOW (ref 135–145)

## 2023-07-09 LAB — BRAIN NATRIURETIC PEPTIDE: B Natriuretic Peptide: 246.9 pg/mL — ABNORMAL HIGH (ref 0.0–100.0)

## 2023-07-09 MED ORDER — MAGNESIUM OXIDE 400 MG PO TABS: 400.0000 mg | ORAL_TABLET | Freq: Every day | ORAL | Status: DC

## 2023-07-09 NOTE — Progress Notes (Unsigned)
This was pharmacy visit w/ Leotis Shames.  Med rec only today. No med changes.  Pill box reconciled x 1 week.   Pt w/o compliants.  Will do home visit next week 8/7 @ 3:00.     Beatrix Shipper, EMT-Paramedic [phone] 07/10/2023

## 2023-07-09 NOTE — Patient Instructions (Signed)
It was a pleasure seeing you today!  MEDICATIONS: -No medication changes today -Call if you have questions about your medications.  LABS: -We will call you if your labs need attention.  NEXT APPOINTMENT: Return to clinic in 2 weeks with [facility].  In general, to take care of your heart failure: -Limit your fluid intake to 2 Liters (half-gallon) per day.   -Limit your salt intake to ideally 2-3 grams (2000-3000 mg) per day. -Weigh yourself daily and record, and bring that "weight diary" to your next appointment.  (Weight gain of 2-3 pounds in 1 day typically means fluid weight.) -The medications for your heart are to help your heart and help you live longer.   -Please contact us before stopping any of your heart medications.  Call the clinic at [phone] with questions or to reschedule future appointments.

## 2023-07-16 ENCOUNTER — Other Ambulatory Visit (HOSPITAL_COMMUNITY): Payer: Self-pay | Admitting: Emergency Medicine

## 2023-07-16 NOTE — Progress Notes (Signed)
Paramedicine Encounter    Patient ID: Austin [name], male    DOB: Aug 14, 1956, 67 y.o.   MRN: [id number]   Complaints NONE  Assessment A&O x 4, skin W&D w/ good color.  Lung sounds clear and equal bilat.  No peripheral edema noted  Compliance with meds YES  Pill box filled x 2 weeks  Refills needed  Vit B1  Meds changes since last visit NONE    Social changes NONE   BP 100/60 (BP Location: Right Arm, Patient Position: Sitting, Cuff Size: Normal)   Resp 16   Wt 145 lb (65.8 kg)   SpO2 95%   BMI 21.41 kg/m  Weight yesterday- not taken Last visit weight-146lb  ATF [name] ready for his home visit.  He had his meds and pill box ready on the table at my arrival.  He states he feels like he's going good.  He denies chest pain or SOB. Lung sounds are clear throughout and no edema noted.  Med box reconciled x 2 weeks (two separate boxes) He tells me he and his wife are getting [place].  I reminded him to take his medications with him and to watch his nutrition and fluid intake.  He states he will do his best. Reminded him of his appointment w/ [facility] 8/13 at 10:00. Also advised pt that I will be out of the office next week.  One of the other CHP's will have my phone should he have needs.  Also advised him he can call the clinic or if he feels he has an emergency to call 911 and he advised he will do same.  Our next home visit is 8/20 @ 10:30 a.m.  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 Take with or immediately following a meal., Disp: 90 tablet, Rfl: 3   Multiple Vitamin (MULTIVITAMIN) tablet, Take 1 tablet by mouth daily., Disp: , Rfl:    sacubitril-valsartan (ENTRESTO) 49-51 MG, Take 1 tablet by mouth 2 (two) times daily., Disp: 60 tablet, Rfl: 6   magnesium oxide (MAG-OX) 400 MG tablet, Take 1 tablet (400 mg total) by mouth daily. (Patient not taking: Reported on 07/16/2023), Disp: , Rfl:    thiamine (VITAMIN B1) 100 MG tablet, Take 1 tablet (100 mg total) by mouth daily. (Patient not taking: Reported on 07/16/2023), Disp: 90 tablet, Rfl: 1 Allergies  Allergen Reactions   Latex Swelling and Rash     Social History   Socioeconomic History   Marital status:  Married    Spouse name: Not on file   Number of children: 2   Years of education: Not on file   Highest education level: Bachelor's degree (e.g., BA, AB, BS)  Occupational History   Not on file  Tobacco Use   Smoking status: Never   Smokeless tobacco: Never  Vaping Use   Vaping status: Never Used  Substance and Sexual Activity   Alcohol use: Yes    Comment: 1-3 beers daily   Drug use: No   Sexual activity: Yes  Other Topics Concern   Not on file  Social History Narrative   Not on file   Social Determinants of Health   Financial Resource Strain: Low Risk  (02/27/2022)   Overall Financial Resource Strain (CARDIA)    Difficulty of Paying Living Expenses: Not hard at all   Food Insecurity: No Food Insecurity (05/21/2023)   Hunger Vital Sign    Worried About Running Out of Food in the Last Year: Never true    Ran Out of Food in the Last Year: Never true  Transportation Needs: No Transportation Needs (06/18/2023)   PRAPARE - Administrator, Civil Service (Medical): No    Lack of Transportation (Non-Medical): No  Physical Activity: Inactive (02/27/2022)   Exercise Vital Sign    Days of Exercise per Week: 0 days    Minutes of Exercise per Session: 0 min  Stress: No Stress Concern Present (02/27/2022)   [employer] of Occupational Health - Occupational Stress Questionnaire    Feeling of Stress : Only a little  Social Connections: Socially Integrated (12/06/2020)   Social Connection and Isolation Panel [NHANES]    Frequency of Communication with Friends and Family: More than three times a week    Frequency of Social Gatherings with Friends and Family: More than three times a week    Attends Religious Services: More than 4 times per year    Active Member of Clubs or Organizations: Yes    Attends Banker Meetings: More than 4 times per year    Marital Status: Married  Catering manager Violence: Not At Risk (05/17/2023)   Humiliation, Afraid, Rape, and Kick questionnaire    Fear of Current or Ex-Partner: No    Emotionally Abused: No    Physically Abused: No    Sexually Abused: No    Physical Exam      Future Appointments  Date Time Provider Department Center  07/22/2023 10:00 AM [facility] PA/NP [facility] None  08/21/2023 10:00 AM [name], FNP [facility] None

## 2023-07-18 NOTE — Progress Notes (Signed)
ADVANCED HF CLINIC NOTE  PCP: [name], FNP Cardiology: Dr. Bjorn Pippin HF Cardiology: Dr. Shirlee Latch  67 y.o. with history of ETOH abuse and prior HTN, and nonischemic cardiomyopathy.   In 2/21, he developed exertional dyspnea and was noted at an urgent care center to have CHF.  Echo 3/21 EF < 20% with severe RV dysfunction.  RHC/LHC in 3/21 showed no CAD but R > L filling pressure elevation and preserved cardiac index.  He was admitted 4/21 with acute/chronic systolic CHF.  He was diuresed and cardiac meds were adjusted.  Cardiac MRI  4/21 EF 14%, moderate LV dilation, severely decreased RV function, and moderate pericardial effusion. There was no evidence for myocarditis noted.   Patient, of note, has a history of heavy ETOH intake for decades. Per his wife, he would drink heavily every day after getting home from work and especially heavily on the weekend. He has a history of ETOH withdrawal.  He does have some family history of CHF (sister, maybe his mother), but he does not have a lot of detail about these diagnoses. No drugs (cocaine, amphetamines).   Echo 8/21 EF 30-35%, diffuse hypokinesis, normal RV, no pericardial effusion. Echo in 10/21 showed EF 45-50% with global hypokinesis.  Echo in 3/22 with EF 45-50%, normal RV, PASP 34 mmHg. TEE in 5/23 showed EF 50-55%, normal RV, trivial MR, mobile RA structure seen on TTE was [facility].   Follow up 4/24, NYHA I-II and volume stable.   Admitted 6/24 with weakness, 2/2 N/V/D x several days prior. Ethanol level 38. Na 118, nephrology consulted and he received IVF, GDMT held. He continued to drink ETOH while admitted, sustained a fall during admission and hit his head, CT head negative. He was discharged home on prior GDMT except spiro, weight 150 lbs.  Sent to the ED 05/26/23 after AHF follow-up.  At appt patient's BP was 60s/50s.  In the ED SBP in 70s.  Responded well to 1 L IVF, refused to get admitted, sent home with improvement in BP.   Suspect volume depleted with high alcohol intake, ethanol level 140.  Seen back in [facility] for post ED f/u and was overall feeling much better. Volume status and BP was ok. Compliant w/ meds and noted abstinence from ETOH. Labs were stable. No dyspnea w/ ADLs, NYHA Class I-II.  Was enrolled in paramedicine.   He returns today for f/u.  Here w/ wife. Reports doing well. No dyspnea w/ basic ADLs around the house. Only gets SOB if he overexert w/ yard work. Reports full med compliance. BP stable 120/82. Has cut back on ETOH. Denies daily use but admits to drinking "several beers" this past weekend at his family reunion.    ECG:  not performed     Labs (4/21): HIV negative, K 4.2, creatinine 0.93 Labs (5/21): K 4.2, creatinine 1.23, digoxin 0.6 Labs (7/21): K 3.8, creatinine 1.49 Labs (10/21): K 4.2, creatinine 1.25, BNP 14 Labs (12/21): K 4.3, creatinine 1.14 Labs (3/22): K 4.4, creatinine 1.34 Labs (7/22): K 3.9, creatinine 1.19 Labs (3/23): K 4.6, creatinine 1.29 Labs (1/24): LDL 48, K 4.2, creatinine 1.61 Labs (6/24): K 3.2, creatinine 1.11 Labs (6/24): K3.7, SCR 1.21, BNP 1200, ethanol 140 Labs (7/24): K 4.4, creatinine 1.15   PMH: 1. Prostate cancer 2. HTN: BP now low.  3. Fe deficiency anemia 4. ETOH abuse 5. GERD 6. Chronic systolic CHF: Nonischemic cardiomyopathy.  - Echo 3/21 with EF < 20%, severe global HK, severely dilated and  severely dysfunctional RV, severe biatrial enlargement.  - RHC/LHC (3/21): No CAD; mean RA 15, PA 51/24, mean PCWP 19, CI 3.96.  - HIV negative 4/21 - Cardiac MRI (4/21): LV EF 14%, moderate LV dilation, mild RV dilation with severely decreased systolic function, moderate pericardial effusion, no LGE noted but images difficult due to recent Fe infusion.  - Echo (8/21): EF 30-35%, diffuse hypokinesis, normal RV, no pericardial fluid.  - Echo (10/21): EF 45-50%, diffuse hypokinesis, normal RV.  - Echo (3/22): EF 45-50%, normal RV, PASP 34 mmHg.  - TEE  (5/23): EF 50-55%, normal RV, trivial MR, mobile RA structure seen on TTE was [facility].   FH: Sister with CHF, brother with pacemaker, mother with CHF.   SH: Married, lives in [place], heavy ETOH in the past has now cut back, no drugs.  He was a Estate agent.   ROS: All systems reviewed and negative except as per HPI.   Current Outpatient Medications  Medication Sig Dispense Refill   empagliflozin (JARDIANCE) 10 MG TABS tablet Take 1 tablet (10 mg total) by mouth daily before breakfast. 30 tablet 6   magnesium oxide (MAG-OX) 400 MG tablet Take 1 tablet (400 mg total) by mouth daily.     metoprolol succinate (TOPROL-XL) 25 MG 24 hr tablet Take 1 tablet (25 mg total) by mouth daily. Take with or immediately following a meal. 90 tablet 3   Multiple Vitamin (MULTIVITAMIN) tablet Take 1 tablet by mouth daily.     sacubitril-valsartan (ENTRESTO) 49-51 MG Take 1 tablet by mouth 2 (two) times daily. 60 tablet 6   No current facility-administered medications for this encounter.   Wt Readings from Last 3 Encounters:  07/22/23 66.8 kg (147 lb 3.2 oz)  07/16/23 65.8 kg (145 lb)  07/09/23 66.2 kg (146 lb)   BP 120/82   Pulse 87   Wt 66.8 kg (147 lb 3.2 oz)   SpO2 97%   BMI 21.74 kg/m  Physical Exam General:  Well appearing. No respiratory difficulty HEENT: dentition in poor repair, otherwise normal Neck: supple. no JVD. Carotids 2+ bilat; no bruits. No lymphadenopathy or thyromegaly appreciated. Cor: PMI nondisplaced. Regular rate & rhythm. No rubs, gallops or murmurs. Lungs: clear Abdomen: soft, nontender, nondistended. No hepatosplenomegaly. No bruits or masses. Good bowel sounds. Extremities: no cyanosis, clubbing, rash, edema Neuro: alert & oriented x 3, cranial nerves grossly intact. moves all 4 extremities w/o difficulty. Affect pleasant.   Assessment/Plan: 1. Chronic systolic CHF: Nonischemic cardiomyopathy.  Echo 3/21 showed EF 20% with moderately decreased RV  systolic function by my read.  RHC/LHC from 3/21 showed no significant coronary disease, preserved cardiac output, R>L heart failure.  Cardiac MRI in 4/21 showed EF 14%, severe RV dysfunction, no LGE.  HIV negative.  Patient has an extensive history of heavy ETOH intake; it is very possible that this is ETOH cardiomyopathy.  No LGE noted on cardiac MRI but difficult LGE images, cannot rule out prior myocarditis.  Family history is not fully defined.  He has not been noted to have frequent PVCs.  Echo in 10/21 showed EF improved to 45-50%, echo in 4/22 showed stable EF 45-50%.  TEE in 5/23 showed EF 50-55%, normal RV, trivial MR, mobile RA structure seen on TTE was [facility].  NYHA class II. Euvolemic on exam.  - Continue Entresto 49-51 mg bid   - Continue Toprol XL 25 mg daily.  - Continue Jardiance 10 mg daily. No GU symptoms - Add Spiro 12.5 mg  daily  - Check BMP and BNP today - Encouraged to refrain from heavy ETOH use  - Will repeat echo  2. ETOH abuse: Long history of heavy ETOH.  This may be the cause of his cardiomyopathy.   - Previously drank 2 40 oz beers/day. Has cut down on daily drinking but still consumes beer on the weekends  - Imperative he cut back, ideally quit. Reiterated this again today  4. SDOH:  - Has not been taking all meds as prescribed. Recently enrolled in paramedicine. Compliance has improved w/ their assistance   F/u w/ APP in 4 wks for further med titration. F/u Dr. Shirlee Latch in 8 wks.   Robbie Lis PA-C  07/22/2023 [facility] [place] [address] [facility] [place] [address] [phone] (office) [phone] (fax)

## 2023-07-21 ENCOUNTER — Telehealth (HOSPITAL_COMMUNITY): Payer: Self-pay | Admitting: Licensed Clinical Social Worker

## 2023-07-21 NOTE — Telephone Encounter (Signed)
The therapist attempt to reach this patient in response to a referral received by Ms. [name], FNP. The therapist leaves a HIPAA-compliant message with a woman who indicates that she is his wife after informing this therapist that he is not home.  His wife asks what the referral is concerning; however, the therapist informs her that he is unable to provide this information. She says that her "name is on everything;" however, as the therapist does not have a ROI from the patient, he cannot provide her with the requested information. She says that the patient is not going to call this therapist back if she does not tell him the reason for the referral which the therapist reiterates he is unable to do.  Myrna Blazer, MA, LCSW, [facility], LCAS 07/21/2023

## 2023-07-22 ENCOUNTER — Ambulatory Visit (HOSPITAL_COMMUNITY)
Admission: RE | Admit: 2023-07-22 | Discharge: 2023-07-22 | Disposition: A | Discharge status: Home / Self Care | Payer: Medicare Other | Source: Ambulatory Visit | Attending: Cardiology | Admitting: Cardiology

## 2023-07-22 ENCOUNTER — Encounter (HOSPITAL_COMMUNITY): Payer: Self-pay

## 2023-07-22 VITALS — BP 120/82 | HR 87 | Wt 147.2 lb

## 2023-07-22 DIAGNOSIS — I5022 Chronic systolic (congestive) heart failure: Secondary | ICD-10-CM | POA: Insufficient documentation

## 2023-07-22 DIAGNOSIS — T50906A Underdosing of unspecified drugs, medicaments and biological substances, initial encounter: Secondary | ICD-10-CM | POA: Diagnosis not present

## 2023-07-22 DIAGNOSIS — Z91128 Patient's intentional underdosing of medication regimen for other reason: Secondary | ICD-10-CM | POA: Diagnosis not present

## 2023-07-22 DIAGNOSIS — I110 Hypertensive heart disease with heart failure: Secondary | ICD-10-CM | POA: Insufficient documentation

## 2023-07-22 DIAGNOSIS — F1010 Alcohol abuse, uncomplicated: Secondary | ICD-10-CM | POA: Insufficient documentation

## 2023-07-22 DIAGNOSIS — I428 Other cardiomyopathies: Secondary | ICD-10-CM | POA: Diagnosis not present

## 2023-07-22 LAB — BASIC METABOLIC PANEL
Anion gap: 13 (ref 5–15)
BUN: 9 mg/dL (ref 8–23)
CO2: 24 mmol/L (ref 22–32)
Calcium: 9.3 mg/dL (ref 8.9–10.3)
Chloride: 95 mmol/L — ABNORMAL LOW (ref 98–111)
Creatinine, Ser: 0.91 mg/dL (ref 0.61–1.24)
GFR, Estimated: >60 mL/min (ref >60)
Glucose, Bld: 115 mg/dL — ABNORMAL HIGH (ref 70–99)
Potassium: 4 mmol/L (ref 3.5–5.1)
Sodium: 132 mmol/L — ABNORMAL LOW (ref 135–145)

## 2023-07-22 LAB — BRAIN NATRIURETIC PEPTIDE: B Natriuretic Peptide: 547.6 pg/mL — ABNORMAL HIGH (ref 0.0–100.0)

## 2023-07-22 NOTE — Patient Instructions (Signed)
Medication Changes:  No Changes In Medications at this time.   Lab Work:  Labs done today, your results will be available in MyChart, we will contact you for abnormal readings.  Testing/Procedures:  Your physician has requested that you have an echocardiogram. Echocardiography is a painless test that uses sound waves to create images of your heart. It provides your doctor with information about the size and shape of your heart and how well your heart's chambers and valves are working. This procedure takes approximately one hour. There are no restrictions for this procedure. Please do NOT wear cologne, perfume, aftershave, or lotions (deodorant is allowed). Please arrive 15 minutes prior to your appointment time.   Follow-Up in: 4 weeks with APP as scheduled   At the [facility], you and your health needs are our priority. We have a designated team specialized in the treatment of Heart Failure. This Care Team includes your primary Heart Failure Specialized Cardiologist (physician), Advanced Practice Providers (APPs- Physician Assistants and Nurse Practitioners), and Pharmacist who all work together to provide you with the care you need, when you need it.   You may see any of the following providers on your designated Care Team at your next follow up:  Dr. Arvilla Meres Dr. Marca Ancona Dr. Marcos Eke, NP Robbie Lis, [place] [facility] [place], [place] Brynda Peon, NP Karle Plumber, PharmD   Please be sure to bring in all your medications bottles to every appointment.   Need to Contact us:  If you have any questions or concerns before your next appointment please send [place] a message through [place] or call our office at [phone].    TO LEAVE A MESSAGE FOR THE NURSE SELECT OPTION 2, PLEASE LEAVE A MESSAGE INCLUDING: YOUR NAME DATE OF BIRTH CALL BACK NUMBER REASON FOR CALL**this is important as we prioritize the call backs  YOU  WILL RECEIVE A CALL BACK THE SAME DAY AS LONG AS YOU CALL BEFORE 4:00 PM

## 2023-07-23 ENCOUNTER — Telehealth (HOSPITAL_COMMUNITY): Payer: Self-pay

## 2023-07-24 NOTE — Telephone Encounter (Signed)
Reached out to pt to sch time to make the emd change of starting sprio 12.5mg  from his clinic visit yesterday.  He asked why I needed to come out to do that, I asked doesn't dede do your pill box, he said yes but he said he and his wife can handle this.   Also, the med has not been sent in to pharmacy yet anyways.   He said he would call me once pharm calls him that its ready and he picks it up and then he can talk it thru with me via phone.    Dede will be back next week and will f/u then.    Kerry Hough, EMT-Paramedic  [phone] 07/23/2023

## 2023-07-28 ENCOUNTER — Telehealth (HOSPITAL_COMMUNITY): Payer: Self-pay

## 2023-07-28 MED ORDER — SPIRONOLACTONE 25 MG PO TABS: 12.5000 mg | ORAL_TABLET | Freq: Every day | ORAL | 3 refills | Status: DC

## 2023-07-28 NOTE — Telephone Encounter (Signed)
[name] script sent

## 2023-07-28 NOTE — Telephone Encounter (Signed)
Katie contacted me reporting Mr. Taylors spironolactone never got sent in for a fill. I contacted HF triage and spoke to Chantel who reports she will submit same now. Call complete.   Maralyn Sago, EMT-Paramedic [phone] 07/28/2023

## 2023-07-28 NOTE — Addendum Note (Signed)
Addended by: Theresia Bough on: 07/28/2023 11:37 AM   Modules accepted: Orders

## 2023-07-29 ENCOUNTER — Other Ambulatory Visit (HOSPITAL_COMMUNITY): Payer: Self-pay | Admitting: Emergency Medicine

## 2023-07-29 NOTE — Progress Notes (Signed)
Paramedicine Encounter    Patient ID: Austin [name], male    DOB: 1956-09-11, 67 y.o.   MRN: [id number]   Complaints NONE Assessment A&O x 4, skin W&D w/ good color.  Lung sounds clear and equal throughout.  No edema noted  Compliance with meds  Missed all meds Fri, Sat and Sunday 8/9, 10, 11th.  Went out of town and forgot to pack med box.  Pill box filled x 1 week  Refills needed Entresto 1 week left  Meds changes since last visit Added 12.5mg . Austin [name]    Social changes NONE    BP 110/80 (BP Location: Left Arm, Patient Position: Sitting, Cuff Size: Normal)   Pulse 88   Resp 16   Wt 145 lb 9.6 oz (66 kg)   SpO2 98%   BMI 21.50 kg/m  Weight yesterday-142.2lb Last visit weight-145lb  [name] reports to be feeling well today.  He has taken his morning meds prior to my arrival.  I stopped by [facility] and picked up his Spironolactone to add to his regimen. Reconciled pill box to include new med Austin [name] 12.5mg . daily.   Pt. Reports that he is starting a new part time job at [employer] 3-4 hrs daily.  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 Lung sounds clear and equal bilat.  No edema noted. Pt admitted to missing 3 full days of meds due to going out of town for a family reunion and forgetting to take his pill box.  He does a good job of documenting  daily weights and noted the missed meds on this sheet. Med box reconciled  x 1 week to include newly added 12.mg. Spironolactone daily. Pt states he is aware of his upcoming ECHO appointment 8/26 at 8:00 and has transportation for same. Next home visit scheduled 8/27 @ 10:30.   ACTION: Home visit completed  Bethanie Dicker [phone] 07/29/23  Patient Care Team: [name], FNP as PCP - General (General Practice) Little Ishikawa, MD as PCP - Cardiology (Cardiology)  Patient Active Problem List   Diagnosis Date Noted   Transient hypotension 06/05/2023   Alcohol use disorder  06/05/2023   Hypertensive 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 Take with or immediately following a meal., Disp: 90 tablet, Rfl: 3   Multiple Vitamin (MULTIVITAMIN) tablet, Take 1 tablet by mouth daily., Disp: , Rfl:    sacubitril-valsartan (ENTRESTO) 49-51 MG, Take 1 tablet by mouth 2 (two) times daily., Disp: 60 tablet, Rfl: 6   spironolactone (ALDACTONE) 25 MG tablet, Take 0.5 tablets (12.5 mg total) by mouth daily., Disp: 45 tablet, Rfl: 3   magnesium oxide (MAG-OX) 400 MG tablet, Take 1 tablet (400 mg total) by mouth daily. (Patient not taking: Reported on 07/29/2023), Disp: , Rfl:  Allergies  Allergen Reactions   Latex Swelling and Rash  Social History   Socioeconomic History   Marital status: Married    Spouse name: Not on file   Number of children: 2   Years of education: Not on file   Highest education level: Bachelor's degree (e.g., BA, AB, BS)  Occupational History   Not on file  Tobacco Use   Smoking status: Never   Smokeless tobacco: Never  Vaping Use   Vaping status: Never Used  Substance and Sexual Activity   Alcohol use: Yes    Comment: 1-3 beers daily   Drug use: No   Sexual activity: Yes  Other Topics Concern   Not on file  Social History Narrative   Not on file   Social Determinants of Health   Financial  Resource Strain: Low Risk  (02/27/2022)   Overall Financial Resource Strain (CARDIA)    Difficulty of Paying Living Expenses: Not hard at all  Food Insecurity: No Food Insecurity (05/21/2023)   Hunger Vital Sign    Worried About Running Out of Food in the Last Year: Never true    Ran Out of Food in the Last Year: Never true  Transportation Needs: No Transportation Needs (06/18/2023)   PRAPARE - Administrator, Civil Service (Medical): No    Lack of Transportation (Non-Medical): No  Physical Activity: Inactive (02/27/2022)   Exercise Vital Sign    Days of Exercise per Week: 0 days    Minutes of Exercise per Session: 0 min  Stress: No Stress Concern Present (02/27/2022)   [employer] of Occupational Health - Occupational Stress Questionnaire    Feeling of Stress : Only a little  Social Connections: Socially Integrated (12/06/2020)   Social Connection and Isolation Panel [NHANES]    Frequency of Communication with Friends and Family: More than three times a week    Frequency of Social Gatherings with Friends and Family: More than three times a week    Attends Religious Services: More than 4 times per year    Active Member of Clubs or Organizations: Yes    Attends Banker Meetings: More than 4 times per year    Marital Status: Married  Catering manager Violence: Not At Risk (05/17/2023)   Humiliation, Afraid, Rape, and Kick questionnaire    Fear of Current or Ex-Partner: No    Emotionally Abused: No    Physically Abused: No    Sexually Abused: No    Physical Exam      Future Appointments  Date Time Provider Department Center  08/04/2023  8:00 AM [facility] ECHO/CH OP [facility]  08/21/2023  9:00 AM [facility] PA/NP [facility] None  08/21/2023 10:00 AM [name], FNP [facility] None  09/25/2023 10:00 AM Laurey Morale, MD [facility] None

## 2023-08-04 ENCOUNTER — Ambulatory Visit (HOSPITAL_COMMUNITY): Admission: RE | Admit: 2023-08-04 | Payer: Medicare Other | Source: Ambulatory Visit

## 2023-08-05 ENCOUNTER — Other Ambulatory Visit (HOSPITAL_COMMUNITY): Payer: Self-pay | Admitting: Emergency Medicine

## 2023-08-05 NOTE — Progress Notes (Unsigned)
Paramedicine Encounter    Patient ID: Austin [name], male    DOB: 14-Jun-1956, 67 y.o.   MRN: [id number]   Complaints***  Assessment***  Compliance with meds YES  Pill box filled x 1 week  Refills needed Entresto  Meds changes since last visit NONE    Social changes NONE   There were no vitals taken for this visit. Weight yesterday- Not taken Last visit weight-145lb  ACTION: {Paramed Action:[phone]}  Beatrix Shipper, EMT-Paramedic [phone] 08/05/23  Patient Care Team: [name], FNP as PCP - General (General Practice) Little Ishikawa, MD as PCP - Cardiology (Cardiology)  Patient Active Problem List   Diagnosis Date Noted  . Transient hypotension 06/05/2023  . Alcohol use disorder 06/05/2023  . 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 Elevated brain natriuretic peptide (BNP) level 06/05/2023  . Elevated LFTs 05/18/2023  . Prolonged QT interval 05/18/2023  . Hyponatremia 05/17/2023  . Iron deficiency anemia 09/03/2020  . NICM (nonischemic cardiomyopathy) (HCC)   . ETOH abuse 11/10/2018  . Essential hypertension 11/10/2018  . Blood in stool 11/10/2018    Current Outpatient Medications:  .  empagliflozin (JARDIANCE) 10 MG TABS tablet, Take 1 tablet (10 mg total) by mouth daily before breakfast., Disp: 30 tablet, Rfl: 6 .  magnesium oxide (MAG-OX) 400 MG tablet, Take 1 tablet (400 mg total) by mouth daily. (Patient not taking: Reported on 07/29/2023), Disp: , Rfl:  .  metoprolol succinate (TOPROL-XL) 25 MG 24 hr tablet, Take 1 tablet (25 mg total) by mouth daily. Take with or immediately following a meal., Disp: 90 tablet, Rfl: 3 .  Multiple Vitamin (MULTIVITAMIN) tablet, Take 1 tablet by mouth daily., Disp: , Rfl:  .  sacubitril-valsartan (ENTRESTO) 49-51 MG, Take 1 tablet by mouth 2 (two) times daily., Disp: 60 tablet, Rfl: 6 .  spironolactone (ALDACTONE) 25 MG tablet,  Take 0.5 tablets (12.5 mg total) by mouth daily., Disp: 45 tablet, Rfl: 3 Allergies  Allergen Reactions  . Latex Swelling and Rash     Social History   Socioeconomic History  . Marital status: Married    Spouse name: Not on file  . Number of children: 2  . Years of education: Not on file  . Highest education level: Bachelor's degree (e.g., BA, AB, BS)  Occupational History  . Not on file  Tobacco Use  . Smoking status: Never  . Smokeless tobacco: Never  Vaping Use  . Vaping status: Never Used  Substance and Sexual Activity  . Alcohol use: Yes    Comment: 1-3 beers daily  . Drug use: No  . Sexual activity: Yes  Other Topics Concern  . Not on file  Social History Narrative  . Not on file   Social Determinants of Health   Financial Resource Strain: Low Risk  (02/27/2022)   Overall Financial Resource Strain (CARDIA)   . Difficulty of Paying Living Expenses: Not hard at all  Food Insecurity: No Food Insecurity (05/21/2023)   Hunger Vital Sign   . Worried About Programme researcher, broadcasting/film/video in the Last Year: Never true   . Ran Out of Food in the Last Year: Never true  Transportation Needs: No Transportation Needs (06/18/2023)   PRAPARE - Transportation   . Lack of Transportation (Medical): No   . Lack of Transportation (Non-Medical): No  Physical Activity: Inactive (02/27/2022)   Exercise Vital Sign   . Days of Exercise per Week: 0 days   .  Minutes of Exercise per Session: 0 min  Stress: No Stress Concern Present (02/27/2022)   [employer] of Occupational Health - Occupational Stress Questionnaire   . Feeling of Stress : Only a little  Social Connections: Socially Integrated (12/06/2020)   Social Connection and Isolation Panel [NHANES]   . Frequency of Communication with Friends and Family: More than three times a week   . Frequency of Social Gatherings with Friends and Family: More than three times a week   . Attends Religious Services: More than 4 times per year   . Active  Member of Clubs or Organizations: Yes   . Attends Banker Meetings: More than 4 times per year   . Marital Status: Married  Catering manager Violence: Not At Risk (05/17/2023)   Humiliation, Afraid, Rape, and Kick questionnaire   . Fear of Current or Ex-Partner: No   . Emotionally Abused: No   . Physically Abused: No   . Sexually Abused: No    Physical Exam      Future Appointments  Date Time Provider Department Center  08/07/2023  8:00 AM [facility] ECHO/CH OP [facility]  08/21/2023  9:00 AM [facility] PA/NP [facility] None  08/21/2023 10:00 AM [name], FNP [facility] None  09/25/2023 10:00 AM Laurey Morale, MD [facility] None

## 2023-08-07 ENCOUNTER — Ambulatory Visit (HOSPITAL_COMMUNITY)
Admission: RE | Admit: 2023-08-07 | Discharge: 2023-08-07 | Disposition: A | Discharge status: Home / Self Care | Payer: Medicare Other | Source: Ambulatory Visit | Attending: Cardiology | Admitting: Cardiology

## 2023-08-07 DIAGNOSIS — I5022 Chronic systolic (congestive) heart failure: Secondary | ICD-10-CM | POA: Diagnosis not present

## 2023-08-07 DIAGNOSIS — I110 Hypertensive heart disease with heart failure: Secondary | ICD-10-CM | POA: Insufficient documentation

## 2023-08-07 DIAGNOSIS — I361 Nonrheumatic tricuspid (valve) insufficiency: Secondary | ICD-10-CM | POA: Diagnosis not present

## 2023-08-07 LAB — ECHOCARDIOGRAM COMPLETE
Area-P 1/2: 4.06 cm2
Calc EF: 28.6 %
Radius: 0.3 cm
S' Lateral: 4.7 cm
Single Plane A2C EF: 34.3 %
Single Plane A4C EF: 24.3 %

## 2023-08-12 ENCOUNTER — Telehealth (HOSPITAL_COMMUNITY): Payer: Self-pay | Admitting: Emergency Medicine

## 2023-08-12 NOTE — Telephone Encounter (Signed)
Austin [name] called to advise he is out of town and will not be available for home visit today.  Rescheduled for 9/5 @ 11:00

## 2023-08-14 ENCOUNTER — Other Ambulatory Visit (HOSPITAL_COMMUNITY): Payer: Self-pay | Admitting: Emergency Medicine

## 2023-08-14 NOTE — Progress Notes (Signed)
Paramedicine Encounter    Patient ID: Austin [name], male    DOB: Aug 31, 1956, 67 y.o.   MRN: [id number]   Complaints NONE  Assessment A&O x 4, skin W&D w/ good color.  Lung sounds clear throughout.  No edema noted  Compliance with meds YES  Pill box filled x 1 week- Needs to add Entresto to Sun-Thurs a.m.  and Sun-Wed and Sat p.m.  Refills needed Entresto  Meds changes since last visit NONE   Social changes NONE   BP 100/70 (BP Location: Left Arm, Patient Position: Sitting, Cuff Size: Normal)   Pulse 76   Wt 144 lb (65.3 kg)   SpO2 100%   BMI 21.27 kg/m  Weight yesterday-not taken Last visit weight-145lb  Home visit finds Austin [name] reporting to be feeling good.  I had initially had a home visit scheduled 9/3 but he contacted me to reschedule for today - 9/5 @ 11:00 because he'd been out of town. Pt is doing a great job weighing daily as well as taking his BP w/ his automatic cuff.  He has been compliant with all medications.  He tells me that his pill box was empty for a couple of days but that he has been taking meds from the bottle without incident.  He is currently only taking 5 medicines.  Med box reconciled x 1 week.  He only had 6 Entresto pills left in his bottle because he had not gone by the pharmacy to p/u his refill.  He advised he would pick it up a little later this morning when he goes to p/u his wife's prescriptions and place them in his pill box.  I wrote down where to put the Entresto's in the box and he advises he understands what to do. He denies chest pain or SOB.  Lung sounds clear throughout.  No peripheral edema noted. Austin [name] has an appointment 9/12 @ 9:00 at the HF clinic.  I advised him to bring his meds and pill box and I will attend the visit with him and do his med rec at that time.  ACTION: Home visit completed  Bethanie Dicker [phone] 08/14/23  Patient Care Team: [name], FNP as PCP - General (General  Practice) Little Ishikawa, MD as PCP - Cardiology (Cardiology)  Patient Active Problem List   Diagnosis Date Noted   Transient hypotension 06/05/2023   Alcohol use disorder 06/05/2023   Hypertensive 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 Take with or immediately following a meal., Disp: 90 tablet, Rfl: 3   Multiple Vitamin (MULTIVITAMIN) tablet, Take 1 tablet by mouth daily., Disp: , Rfl:    sacubitril-valsartan (ENTRESTO) 49-51 MG, Take 1 tablet by mouth 2 (two) times daily., Disp: 60 tablet, Rfl: 6   spironolactone (ALDACTONE) 25 MG tablet, Take 0.5 tablets (12.5 mg total) by mouth daily., Disp: 45  tablet, Rfl: 3   magnesium oxide (MAG-OX) 400 MG tablet, Take 1 tablet (400 mg total) by mouth daily. (Patient not taking: Reported on 07/29/2023), Disp: , Rfl:  Allergies  Allergen Reactions   Latex Swelling and Rash     Social History   Socioeconomic History   Marital status: Married    Spouse name: Not on file   Number of children: 2   Years of education: Not on file   Highest education level: Bachelor's degree (e.g., BA, AB, BS)  Occupational History   Not on file  Tobacco Use   Smoking status: Never   Smokeless tobacco: Never  Vaping Use   Vaping status: Never Used  Substance and Sexual Activity   Alcohol use: Yes    Comment:  1-3 beers daily   Drug use: No   Sexual activity: Yes  Other Topics Concern   Not on file  Social History Narrative   Not on file   Social Determinants of Health   Financial Resource Strain: Low Risk  (02/27/2022)   Overall Financial Resource Strain (CARDIA)    Difficulty of Paying Living Expenses: Not hard at all  Food Insecurity: No Food Insecurity (05/21/2023)   Hunger Vital Sign    Worried About Running Out of Food in the Last Year: Never true    Ran Out of Food in the Last Year: Never true  Transportation Needs: No Transportation Needs (06/18/2023)   PRAPARE - Administrator, Civil Service (Medical): No    Lack of Transportation (Non-Medical): No  Physical Activity: Inactive (02/27/2022)   Exercise Vital Sign    Days of Exercise per Week: 0 days    Minutes of Exercise per Session: 0 min  Stress: No Stress Concern Present (02/27/2022)   [employer] of Occupational Health - Occupational Stress Questionnaire    Feeling of Stress : Only a little  Social Connections: Socially Integrated (12/06/2020)   Social Connection and Isolation Panel [NHANES]    Frequency of Communication with Friends and Family: More than three times a week    Frequency of Social Gatherings with Friends and Family: More than three times a week    Attends Religious Services: More than 4 times per year    Active Member of Clubs or Organizations: Yes    Attends Banker Meetings: More than 4 times per year    Marital Status: Married  Catering manager Violence: Not At Risk (05/17/2023)   Humiliation, Afraid, Rape, and Kick questionnaire    Fear of Current or Ex-Partner: No    Emotionally Abused: No    Physically Abused: No    Sexually Abused: No    Physical Exam      Future Appointments  Date Time Provider Department Center  08/21/2023  9:00 AM [facility] PA/NP [facility] None  08/21/2023 10:00 AM [name], FNP [facility] None  09/25/2023 10:00 AM Laurey Morale, MD [facility]  None

## 2023-08-18 NOTE — Progress Notes (Signed)
ADVANCED HF CLINIC NOTE  PCP: [name], FNP Cardiology: Dr. Bjorn Pippin HF Cardiology: Dr. Shirlee Latch  67 y.o. with history of ETOH abuse and prior HTN, and nonischemic cardiomyopathy.   In 2/21, he developed exertional dyspnea and was noted at an urgent care center to have CHF.  Echo 3/21 EF < 20% with severe RV dysfunction.  RHC/LHC in 3/21 showed no CAD but R > L filling pressure elevation and preserved cardiac index.  He was admitted 4/21 with acute/chronic systolic CHF.  He was diuresed and cardiac meds were adjusted.  Cardiac MRI  4/21 EF 14%, moderate LV dilation, severely decreased RV function, and moderate pericardial effusion. There was no evidence for myocarditis noted.   Patient, of note, has a history of heavy ETOH intake for decades. Per his wife, he would drink heavily every day after getting home from work and especially heavily on the weekend. He has a history of ETOH withdrawal.  He does have some family history of CHF (sister, maybe his mother), but he does not have a lot of detail about these diagnoses. No drugs (cocaine, amphetamines).   Echo 8/21 EF 30-35%, diffuse hypokinesis, normal RV, no pericardial effusion. Echo in 10/21 showed EF 45-50% with global hypokinesis.  Echo in 3/22 with EF 45-50%, normal RV, PASP 34 mmHg. TEE in 5/23 showed EF 50-55%, normal RV, trivial MR, mobile RA structure seen on TTE was [facility].   Follow up 4/24, NYHA I-II and volume stable.   Admitted 6/24 with weakness, 2/2 N/V/D x several days prior. Ethanol level 38. Na 118, nephrology consulted and he received IVF, GDMT held. He continued to drink ETOH while admitted, sustained a fall during admission and hit his head, CT head negative. He was discharged home on prior GDMT except spiro, weight 150 lbs.  Sent to the ED 05/26/23 after AHF follow-up.  At appt patient's BP was 60s/50s.  In the ED SBP in 70s.  Responded well to 1 L IVF, refused to get admitted, sent home with improvement in BP.   Suspect volume depleted with high alcohol intake, ethanol level 140.  Seen back in [facility] for post ED f/u and was overall feeling much better. Volume status and BP was ok. Compliant w/ meds and noted abstinence from ETOH. Labs were stable. No dyspnea w/ ADLs, NYHA Class I-II.  Was enrolled in paramedicine.   Echo 8/24 EF 25-30%, GIDD, RV normal. Suspected drop in EF 2/2 med noncompliance and ETOH.   Today he returns for AHF follow up. Overall feeling ***. Denies palpitations, CP, dizziness, edema, or PND/Orthopnea. *** SOB. Appetite ok. No fever or chills. Weight at home *** pounds. Taking all medications. ETOH, smoking ***  Has cut back on ETOH. Denies daily use but admits to drinking "several beers" this past weekend at his family reunion. ***  ECG:  not performed     Labs (4/21): HIV negative, K 4.2, creatinine 0.93 Labs (5/21): K 4.2, creatinine 1.23, digoxin 0.6 Labs (7/21): K 3.8, creatinine 1.49 Labs (10/21): K 4.2, creatinine 1.25, BNP 14 Labs (12/21): K 4.3, creatinine 1.14 Labs (3/22): K 4.4, creatinine 1.34 Labs (7/22): K 3.9, creatinine 1.19 Labs (3/23): K 4.6, creatinine 1.29 Labs (1/24): LDL 48, K 4.2, creatinine 8.29 Labs (6/24): K 3.2, creatinine 1.11 Labs (6/24): K3.7, SCR 1.21, BNP 1200, ethanol 140 Labs (7/24): K 4.4, creatinine 1.15  Labs (8/24): K 4, SCr 0.91  PMH: 1. Prostate cancer 2. HTN: BP now low.  3. Fe deficiency anemia 4.  ETOH abuse 5. GERD 6. Chronic systolic CHF: Nonischemic cardiomyopathy.  - Echo 3/21 with EF < 20%, severe global HK, severely dilated and severely dysfunctional RV, severe biatrial enlargement.  - RHC/LHC (3/21): No CAD; mean RA 15, PA 51/24, mean PCWP 19, CI 3.96.  - HIV negative 4/21 - Cardiac MRI (4/21): LV EF 14%, moderate LV dilation, mild RV dilation with severely decreased systolic function, moderate pericardial effusion, no LGE noted but images difficult due to recent Fe infusion.  - Echo (8/21): EF 30-35%, diffuse hypokinesis,  normal RV, no pericardial fluid.  - Echo (10/21): EF 45-50%, diffuse hypokinesis, normal RV.  - Echo (3/22): EF 45-50%, normal RV, PASP 34 mmHg.  - TEE (5/23): EF 50-55%, normal RV, trivial MR, mobile RA structure seen on TTE was [facility].  - Echo 8/24 EF 25-30%, GIDD, RV normal   FH: Sister with CHF, brother with pacemaker, mother with CHF.   SH: Married, lives in [place], heavy ETOH in the past has now cut back, no drugs.  He was a Estate agent.   ROS: All systems reviewed and negative except as per HPI.   Current Outpatient Medications  Medication Sig Dispense Refill   empagliflozin (JARDIANCE) 10 MG TABS tablet Take 1 tablet (10 mg total) by mouth daily before breakfast. 30 tablet 6   magnesium oxide (MAG-OX) 400 MG tablet Take 1 tablet (400 mg total) by mouth daily. (Patient not taking: Reported on 07/29/2023)     metoprolol succinate (TOPROL-XL) 25 MG 24 hr tablet Take 1 tablet (25 mg total) by mouth daily. Take with or immediately following a meal. 90 tablet 3   Multiple Vitamin (MULTIVITAMIN) tablet Take 1 tablet by mouth daily.     sacubitril-valsartan (ENTRESTO) 49-51 MG Take 1 tablet by mouth 2 (two) times daily. 60 tablet 6   spironolactone (ALDACTONE) 25 MG tablet Take 0.5 tablets (12.5 mg total) by mouth daily. 45 tablet 3   No current facility-administered medications for this visit.   Wt Readings from Last 3 Encounters:  08/14/23 65.3 kg (144 lb)  08/05/23 65.8 kg (145 lb)  07/29/23 66 kg (145 lb 9.6 oz)   There were no vitals taken for this visit. Physical Exam General:  *** appearing.  No respiratory difficulty HEENT: normal Neck: supple. JVD *** cm. Carotids 2+ bilat; no bruits. No lymphadenopathy or thyromegaly appreciated. Cor: PMI nondisplaced. Regular rate & rhythm. No rubs, gallops or murmurs. Lungs: clear Abdomen: soft, nontender, nondistended. No hepatosplenomegaly. No bruits or masses. Good bowel sounds. Extremities: no cyanosis, clubbing,  rash, edema  Neuro: alert & oriented x 3, cranial nerves grossly intact. moves all 4 extremities w/o difficulty. Affect pleasant.   Assessment/Plan: 1. Chronic systolic CHF: Nonischemic cardiomyopathy.  Echo 3/21 showed EF 20% with moderately decreased RV systolic function by my read.  RHC/LHC from 3/21 showed no significant coronary disease, preserved cardiac output, R>L heart failure.  Cardiac MRI in 4/21 showed EF 14%, severe RV dysfunction, no LGE.  HIV negative.  Patient has an extensive history of heavy ETOH intake; it is very possible that this is ETOH cardiomyopathy.  No LGE noted on cardiac MRI but difficult LGE images, cannot rule out prior myocarditis.  Family history is not fully defined.  He has not been noted to have frequent PVCs.  Echo in 10/21 showed EF improved to 45-50%, echo in 4/22 showed stable EF 45-50%.  TEE in 5/23 showed EF 50-55%, normal RV, trivial MR, mobile RA structure seen on TTE was [facility]  network. Echo 8/24 EF 25-30%, GIDD, RV normal (suspected drop in EF 2/2 med noncompliance and ETOH). - NYHA class II. Euvolemic on exam. *** - Continue Entresto 49-51 mg bid   - Continue Toprol XL 25 mg daily.  - Continue Jardiance 10 mg daily. No GU symptoms - Continue Spiro 12.5 mg daily *** - Check BMP and BNP today - Encouraged to refrain from heavy ETOH use  - Repeat echo in Dec/Jan after staying compliant with meds to reassess EF 2. ETOH abuse: Long history of heavy ETOH.  This may be the cause of his cardiomyopathy.   - Previously drank 2 40 oz beers/day. Has cut down on daily drinking but still consumes beer on the weekends  - Imperative he cut back, ideally quit. Reiterated this again today  4. SDOH:  - Has not been taking all meds as prescribed. Recently enrolled in paramedicine. Compliance has improved w/ their assistance   F/u with Dr. Shirlee Latch in 4 wks as scheduled.   Alen Bleacher AGACNP-BC  08/18/2023 [facility] [address] Heart and Vascular [place] [address] [phone] (office) [phone] (fax)

## 2023-08-19 NOTE — Progress Notes (Unsigned)
Austin [name], CMA,acting as a Neurosurgeon for Austin Felts, FNP.,have documented all relevant documentation on the behalf of Austin Felts, FNP,as directed by  Austin Felts, FNP while in the presence of Austin Felts, FNP.  Subjective:   Patient ID: Austin [name] , male    DOB: 26-Sep-1956 , 67 y.o.   MRN: [id number]  No chief complaint on file.   HPI  Patient presents today for HM, Patient reports compliance with medications. Patient denies any Chest pain, SOB, and headaches. Patient has no concerns today.     Past Medical History:  Diagnosis Date  . Arthritis    hands  . CHF (congestive heart failure) (HCC)   . GERD (gastroesophageal reflux disease)   . Hypertension   . Hypomagnesemia   . Hyponatremia   . Prostate cancer [facility])      Family History  Problem Relation Age of Onset  . Hypertension Mother   . Hypertension Father      Current Outpatient Medications:  .  empagliflozin (JARDIANCE) 10 MG TABS tablet, Take 1 tablet (10 mg total) by mouth daily before breakfast., Disp: 30 tablet, Rfl: 6 .  magnesium oxide (MAG-OX) 400 MG tablet, Take 1 tablet (400 mg total) by mouth daily. (Patient not taking: Reported on 07/29/2023), Disp: , Rfl:  .  metoprolol succinate (TOPROL-XL) 25 MG 24 hr tablet, Take 1 tablet (25 mg total) by mouth daily. Take with or immediately following a meal., Disp: 90 tablet, Rfl: 3 .  Multiple Vitamin (MULTIVITAMIN) tablet, Take 1 tablet by mouth daily., Disp: , Rfl:  .  sacubitril-valsartan (ENTRESTO) 49-51 MG, Take 1 tablet by mouth 2 (two) times daily., Disp: 60 tablet, Rfl: 6 .  spironolactone (ALDACTONE) 25 MG tablet, Take 0.5 tablets (12.5 mg total) by mouth daily., Disp: 45 tablet, Rfl: 3   Allergies  Allergen Reactions  . Latex Swelling and Rash     Men's preventive visit. Patient Health Questionnaire (PHQ-2) is  Flowsheet Row Erroneous Encounter from [facility]/15/2023 in [facility]  PHQ-2 Total Score 0     .  Patient is on a *** diet. Marital status: Married. Relevant history for alcohol use is:  Social History   Substance and Sexual Activity  Alcohol Use Yes   Comment: 1-3 beers daily  . Relevant history for tobacco use is:  Social History   Tobacco Use  Smoking Status Never  Smokeless Tobacco Never  .   Review of Systems   There were no vitals filed for this visit. There is no height or weight on file to calculate BMI.  Wt Readings from Last 3 Encounters:  08/14/23 144 lb (65.3 kg)  08/05/23 145 lb (65.8 kg)  07/29/23 145 lb 9.6 oz (66 kg)    Objective:  Physical Exam      Assessment And Plan:    Encounter for annual health examination  Iron deficiency anemia secondary to inadequate dietary iron intake  History of prostate cancer  ETOH abuse  Transient hypotension  Essential hypertension     No follow-ups on file. Patient was given opportunity to ask questions. Patient verbalized understanding of the plan and was able to repeat key elements of the plan. All questions were answered to their satisfaction.   Austin Felts, FNP  I, Austin Felts, FNP, have reviewed all documentation for this visit. The documentation on 08/19/23 for the exam, diagnosis, procedures, and orders are all accurate and complete.

## 2023-08-21 ENCOUNTER — Ambulatory Visit (HOSPITAL_COMMUNITY)
Admission: RE | Admit: 2023-08-21 | Discharge: 2023-08-21 | Disposition: A | Discharge status: Home / Self Care | Payer: Medicare Other | Source: Ambulatory Visit | Attending: Physician Assistant | Admitting: Physician Assistant

## 2023-08-21 ENCOUNTER — Other Ambulatory Visit (HOSPITAL_COMMUNITY): Payer: Self-pay | Admitting: Emergency Medicine

## 2023-08-21 ENCOUNTER — Encounter: Payer: Medicare Other | Admitting: Nurse Practitioner

## 2023-08-21 ENCOUNTER — Encounter (HOSPITAL_COMMUNITY): Payer: Self-pay

## 2023-08-21 VITALS — BP 124/80 | HR 86 | Wt 147.0 lb

## 2023-08-21 DIAGNOSIS — I10 Essential (primary) hypertension: Secondary | ICD-10-CM

## 2023-08-21 DIAGNOSIS — I5022 Chronic systolic (congestive) heart failure: Secondary | ICD-10-CM | POA: Insufficient documentation

## 2023-08-21 DIAGNOSIS — I428 Other cardiomyopathies: Secondary | ICD-10-CM | POA: Diagnosis not present

## 2023-08-21 DIAGNOSIS — F1010 Alcohol abuse, uncomplicated: Secondary | ICD-10-CM | POA: Diagnosis present

## 2023-08-21 DIAGNOSIS — Z7984 Long term (current) use of oral hypoglycemic drugs: Secondary | ICD-10-CM | POA: Diagnosis not present

## 2023-08-21 DIAGNOSIS — Z0000 Encounter for general adult medical examination without abnormal findings: Secondary | ICD-10-CM

## 2023-08-21 DIAGNOSIS — Z139 Encounter for screening, unspecified: Secondary | ICD-10-CM | POA: Diagnosis not present

## 2023-08-21 DIAGNOSIS — Z91148 Patient's other noncompliance with medication regimen for other reason: Secondary | ICD-10-CM | POA: Insufficient documentation

## 2023-08-21 DIAGNOSIS — I110 Hypertensive heart disease with heart failure: Secondary | ICD-10-CM | POA: Insufficient documentation

## 2023-08-21 DIAGNOSIS — I959 Hypotension, unspecified: Secondary | ICD-10-CM

## 2023-08-21 DIAGNOSIS — Z79899 Other long term (current) drug therapy: Secondary | ICD-10-CM | POA: Diagnosis not present

## 2023-08-21 DIAGNOSIS — D508 Other iron deficiency anemias: Secondary | ICD-10-CM

## 2023-08-21 DIAGNOSIS — Z8546 Personal history of malignant neoplasm of prostate: Secondary | ICD-10-CM

## 2023-08-21 LAB — BASIC METABOLIC PANEL
Anion gap: 11 (ref 5–15)
BUN: 16 mg/dL (ref 8–23)
CO2: 23 mmol/L (ref 22–32)
Calcium: 9.3 mg/dL (ref 8.9–10.3)
Chloride: 94 mmol/L — ABNORMAL LOW (ref 98–111)
Creatinine, Ser: 1.13 mg/dL (ref 0.61–1.24)
GFR, Estimated: >60 mL/min (ref >60)
Glucose, Bld: 85 mg/dL (ref 70–99)
Potassium: 4.7 mmol/L (ref 3.5–5.1)
Sodium: 128 mmol/L — ABNORMAL LOW (ref 135–145)

## 2023-08-21 LAB — BRAIN NATRIURETIC PEPTIDE: B Natriuretic Peptide: 364.2 pg/mL — ABNORMAL HIGH (ref 0.0–100.0)

## 2023-08-21 MED ORDER — SPIRONOLACTONE 25 MG PO TABS: 25.0000 mg | ORAL_TABLET | Freq: Every day | ORAL | 3 refills | Status: DC

## 2023-08-21 NOTE — Patient Instructions (Signed)
Medication Changes:  INCREASE SPIRONOLACTONE TO 25MG  ONCE DAILY   Lab Work:  Labs done today, your results will be available in MyChart, we will contact you for abnormal readings.  THEN RETURN FOR LABS IN 10 DAYS AS SCHEDULED   Follow-Up in: AS SCHEDULED   At the [facility], you and your health needs are our priority. We have a designated team specialized in the treatment of Heart Failure. This Care Team includes your primary Heart Failure Specialized Cardiologist (physician), Advanced Practice Providers (APPs- Physician Assistants and Nurse Practitioners), and Pharmacist who all work together to provide you with the care you need, when you need it.   You may see any of the following providers on your designated Care Team at your next follow up:  Dr. Arvilla Meres Dr. Marca Ancona Dr. Marcos Eke, NP Robbie Lis, [place] [facility] [place], [place] Brynda Peon, NP Karle Plumber, PharmD   Please be sure to bring in all your medications bottles to every appointment.   Need to Contact us:  If you have any questions or concerns before your next appointment please send [place] a message through [place] or call our office at [phone].    TO LEAVE A MESSAGE FOR THE NURSE SELECT OPTION 2, PLEASE LEAVE A MESSAGE INCLUDING: YOUR NAME DATE OF BIRTH CALL BACK NUMBER REASON FOR CALL**this is important as we prioritize the call backs  YOU WILL RECEIVE A CALL BACK THE SAME DAY AS LONG AS YOU CALL BEFORE 4:00 PM

## 2023-08-21 NOTE — Progress Notes (Signed)
Paramedicine Encounter   Patient ID: CARLA JANICKE , male,   DOB: 31-Jul-1956,66 y.o.,  MRN: [id number]   Met patient in clinic today with provider.   YQMVHQ@ clinic-147lb [id number]/[id number]/[id number]   Med changes today (if any) : Increase Spironolactone to 25mg  daily  At clinic visit discussed decreasing alcohol consumption and being more diligent in making better food choices.  These changes along with his medications can help move towards a stronger EF.  Pt. Advises he understands same and will try to modify these things.   Med box reconicled during visit to include increased dose of Spironolactone.      Beatrix Shipper, EMT-Paramedic [phone] 08/21/2023

## 2023-08-28 ENCOUNTER — Other Ambulatory Visit (HOSPITAL_COMMUNITY): Payer: Self-pay | Admitting: Emergency Medicine

## 2023-08-28 NOTE — Progress Notes (Signed)
Paramedicine Encounter    Patient ID: Austin [name], male    DOB: [birth year], 67 y.o.   MRN: [id number]   Complaints NONE  Assessment A&O x 4, skin W&D w/ good color.  Lung sounds clear and no edema noted  Compliance with meds Missed 1 p.m. Entresto dose  Pill box filled x 1 week  Refills needed Multi-vitamin  Meds changes since last visit none    Social changes NONE   BP 118/70 (BP Location: Left Arm, Patient Position: Sitting, Cuff Size: Normal)   Pulse 70   Resp 14   Wt 146 lb (66.2 kg)   SpO2 97%   BMI 21.56 kg/m  Weight yesterday-147lb Last visit weight-147lb  No complaints or needs at this time.  Med box reconciled x 1 week.  Pt is doing well with med compliance.  He is aware he needs to get more Multi-vitamins from drug store and will do same.  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 Take with or immediately following a meal.,  Disp: 90 tablet, Rfl: 3   Multiple Vitamin (MULTIVITAMIN) tablet, Take 1 tablet by mouth daily., Disp: , Rfl:    sacubitril-valsartan (ENTRESTO) 49-51 MG, Take 1 tablet by mouth 2 (two) times daily., Disp: 60 tablet, Rfl: 6   spironolactone (ALDACTONE) 25 MG tablet, Take 1 tablet (25 mg total) by mouth daily., Disp: 90 tablet, Rfl: 3 Allergies  Allergen Reactions   Latex Swelling and Rash     Social History   Socioeconomic History   Marital status: Married    Spouse name: Not on file   Number of children: 2   Years of education: Not on file   Highest education level: Bachelor's degree (e.g., BA, AB, BS)  Occupational History   Not on file  Tobacco Use   Smoking status: Never   Smokeless tobacco: Never  Vaping Use   Vaping status: Never Used  Substance and Sexual Activity   Alcohol use: Yes    Comment: 1-3 beers daily   Drug use: No   Sexual activity: Yes  Other Topics Concern   Not on file  Social History Narrative   Not on file   Social Determinants of Health   Financial Resource Strain: Low Risk  (02/27/2022)   Overall Financial Resource Strain (CARDIA)    Difficulty of Paying Living Expenses: Not hard at all  Food Insecurity: No Food Insecurity (05/21/2023)   Hunger Vital Sign    Worried About Running Out of  Food in the Last Year: Never true    Ran Out of Food in the Last Year: Never true  Transportation Needs: No Transportation Needs (06/18/2023)   PRAPARE - Administrator, Civil Service (Medical): No    Lack of Transportation (Non-Medical): No  Physical Activity: Inactive (02/27/2022)   Exercise Vital Sign    Days of Exercise per Week: 0 days    Minutes of Exercise per Session: 0 min  Stress: No Stress Concern Present (02/27/2022)   [employer] of Occupational Health - Occupational Stress Questionnaire    Feeling of Stress : Only a little  Social Connections: Socially Integrated (12/06/2020)   Social Connection and Isolation Panel [NHANES]     Frequency of Communication with Friends and Family: More than three times a week    Frequency of Social Gatherings with Friends and Family: More than three times a week    Attends Religious Services: More than 4 times per year    Active Member of Clubs or Organizations: Yes    Attends Banker Meetings: More than 4 times per year    Marital Status: Married  Catering manager Violence: Not At Risk (05/17/2023)   Humiliation, Afraid, Rape, and Kick questionnaire    Fear of Current or Ex-Partner: No    Emotionally Abused: No    Physically Abused: No    Sexually Abused: No    Physical Exam      Future Appointments  Date Time Provider Department Center  09/01/2023  9:30 AM [facility] LAB [facility] None  09/25/2023 10:00 AM Laurey Morale, MD [facility] None

## 2023-09-01 ENCOUNTER — Ambulatory Visit (HOSPITAL_COMMUNITY)
Admission: RE | Admit: 2023-09-01 | Discharge: 2023-09-01 | Disposition: A | Discharge status: Home / Self Care | Payer: Medicare Other | Source: Ambulatory Visit | Attending: Internal Medicine | Admitting: Internal Medicine

## 2023-09-01 DIAGNOSIS — I5022 Chronic systolic (congestive) heart failure: Secondary | ICD-10-CM | POA: Diagnosis not present

## 2023-09-01 LAB — BASIC METABOLIC PANEL
Anion gap: 11 (ref 5–15)
BUN: 7 mg/dL — ABNORMAL LOW (ref 8–23)
CO2: 23 mmol/L (ref 22–32)
Calcium: 9.3 mg/dL (ref 8.9–10.3)
Chloride: 92 mmol/L — ABNORMAL LOW (ref 98–111)
Creatinine, Ser: 0.96 mg/dL (ref 0.61–1.24)
GFR, Estimated: >60 mL/min (ref >60)
Glucose, Bld: 87 mg/dL (ref 70–99)
Potassium: 4.3 mmol/L (ref 3.5–5.1)
Sodium: 126 mmol/L — ABNORMAL LOW (ref 135–145)

## 2023-09-02 ENCOUNTER — Telehealth (HOSPITAL_COMMUNITY): Payer: Self-pay

## 2023-09-02 NOTE — Telephone Encounter (Signed)
Labs scheduled for repeat BMET for October 1st at 11:00.   I notified paramedic Dede to make Mr. Austin [name] aware.   Call complete.    Maralyn Sago, EMT-Paramedic [phone] 09/02/2023

## 2023-09-03 ENCOUNTER — Other Ambulatory Visit (HOSPITAL_COMMUNITY): Payer: Self-pay | Admitting: Emergency Medicine

## 2023-09-03 NOTE — Progress Notes (Unsigned)
Paramedicine Encounter    Patient ID: Austin [name], male    DOB: Nov 11, 1956, 67 y.o.   MRN: [id number]   Complaints***  Assessment***  Compliance with meds***  Pill box filled***  Refills needed***  Meds changes since last visit***    Social changes***   There were no vitals taken for this visit. Weight yesterday-not take Last visit weight-146lb  ACTION: {Paramed Action:[id number]}  Beatrix Shipper, EMT-Paramedic [phone] 09/03/23  Patient Care Team: [name], FNP as PCP - General (General Practice) Little Ishikawa, MD as PCP - Cardiology (Cardiology)  Patient Active Problem List   Diagnosis Date Noted  . Transient hypotension 06/05/2023  . Alcohol use disorder 06/05/2023  . 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 Elevated brain natriuretic peptide (BNP) level 06/05/2023  . Elevated LFTs 05/18/2023  . Prolonged QT interval 05/18/2023  . Hyponatremia 05/17/2023  . Iron deficiency anemia 09/03/2020  . NICM (nonischemic cardiomyopathy) (HCC)   . ETOH abuse 11/10/2018  . Essential hypertension 11/10/2018  . Blood in stool 11/10/2018    Current Outpatient Medications:  .  empagliflozin (JARDIANCE) 10 MG TABS tablet, Take 1 tablet (10 mg total) by mouth daily before breakfast., Disp: 30 tablet, Rfl: 6 .  metoprolol succinate (TOPROL-XL) 25 MG 24 hr tablet, Take 1 tablet (25 mg total) by mouth daily. Take with or immediately following a meal., Disp: 90 tablet, Rfl: 3 .  Multiple Vitamin (MULTIVITAMIN) tablet, Take 1 tablet by mouth daily., Disp: , Rfl:  .  sacubitril-valsartan (ENTRESTO) 49-51 MG, Take 1 tablet by mouth 2 (two) times daily., Disp: 60 tablet, Rfl: 6 .  spironolactone (ALDACTONE) 25 MG tablet, Take 1 tablet (25 mg total) by mouth daily., Disp: 90 tablet, Rfl: 3 Allergies  Allergen Reactions  . Latex Swelling and Rash     Social History   Socioeconomic  History  . Marital status: Married    Spouse name: Not on file  . Number of children: 2  . Years of education: Not on file  . Highest education level: Bachelor's degree (e.g., BA, AB, BS)  Occupational History  . Not on file  Tobacco Use  . Smoking status: Never  . Smokeless tobacco: Never  Vaping Use  . Vaping status: Never Used  Substance and Sexual Activity  . Alcohol use: Yes    Comment: 1-3 beers daily  . Drug use: No  . Sexual activity: Yes  Other Topics Concern  . Not on file  Social History Narrative  . Not on file   Social Determinants of Health   Financial Resource Strain: Low Risk  (02/27/2022)   Overall Financial Resource Strain (CARDIA)   . Difficulty of Paying Living Expenses: Not hard at all  Food Insecurity: No Food Insecurity (05/21/2023)   Hunger Vital Sign   . Worried About Programme researcher, broadcasting/film/video in the Last Year: Never true   . Ran Out of Food in the Last Year: Never true  Transportation Needs: No Transportation Needs (06/18/2023)   PRAPARE - Transportation   . Lack of Transportation (Medical): No   . Lack of Transportation (Non-Medical): No  Physical Activity: Inactive (02/27/2022)   Exercise Vital Sign   . Days of Exercise per Week: 0 days   . Minutes of Exercise per Session: 0 min  Stress: No Stress Concern Present (02/27/2022)   [employer] of Occupational Health - Occupational Stress Questionnaire   . Feeling of Stress : Only a little  Social Connections: Socially Integrated (12/06/2020)   Social Connection and Isolation Panel [NHANES]   . Frequency of Communication with Friends and Family: More than three times a week   . Frequency of Social Gatherings with Friends and Family: More than three times a week   . Attends Religious Services: More than 4 times per year   . Active Member of Clubs or Organizations: Yes   . Attends Banker Meetings: More than 4 times per year   . Marital Status: Married  Catering manager Violence: Not At  Risk (05/17/2023)   Humiliation, Afraid, Rape, and Kick questionnaire   . Fear of Current or Ex-Partner: No   . Emotionally Abused: No   . Physically Abused: No   . Sexually Abused: No    Physical Exam      Future Appointments  Date Time Provider Department Center  09/09/2023 11:00 AM [facility] None  09/25/2023 10:00 AM Laurey Morale, MD [facility] None

## 2023-09-09 ENCOUNTER — Ambulatory Visit (HOSPITAL_COMMUNITY)
Admission: RE | Admit: 2023-09-09 | Discharge: 2023-09-09 | Disposition: A | Discharge status: Home / Self Care | Payer: Medicare Other | Source: Ambulatory Visit | Attending: Internal Medicine | Admitting: Internal Medicine

## 2023-09-09 DIAGNOSIS — I5022 Chronic systolic (congestive) heart failure: Secondary | ICD-10-CM | POA: Diagnosis not present

## 2023-09-09 LAB — BASIC METABOLIC PANEL WITH GFR
Anion gap: 10 (ref 5–15)
BUN: 10 mg/dL (ref 8–23)
CO2: 24 mmol/L (ref 22–32)
Calcium: 9.2 mg/dL (ref 8.9–10.3)
Chloride: 92 mmol/L — ABNORMAL LOW (ref 98–111)
Creatinine, Ser: 0.87 mg/dL (ref 0.61–1.24)
GFR, Estimated: >60 mL/min (ref >60)
Glucose, Bld: 78 mg/dL (ref 70–99)
Potassium: 4.4 mmol/L (ref 3.5–5.1)
Sodium: 126 mmol/L — ABNORMAL LOW (ref 135–145)

## 2023-09-10 ENCOUNTER — Other Ambulatory Visit (HOSPITAL_COMMUNITY): Payer: Self-pay | Admitting: Emergency Medicine

## 2023-09-10 ENCOUNTER — Other Ambulatory Visit (HOSPITAL_COMMUNITY): Payer: Self-pay | Admitting: Cardiology

## 2023-09-10 NOTE — Progress Notes (Signed)
Paramedicine Encounter    Patient ID: Austin [name], male    DOB: [birth year], 67 y.o.   MRN: [id number]   Complaints NONE  Assessment A&O x 4, skin W&D w good color.  No chest pain or SOB.  Lung sounds clear and equal throughout and no peripheral edema   Compliance with meds Missed Fri& Sat AM&PM Dose  Pill box filled x 1 week  Refills needed Spironolactone, Entresto, Jardiance, multivitamin  Meds changes since last visit NONE    Social changes NONE   Wt 146 lb (66.2 kg)   BMI 21.56 kg/m  Weight yesterday- not taken Last visit weight-147lb  Pt. Lab results from yesterday show his sodium is still low.  Notes from [facility] suggests pt make an appointment with his PCP.  Pt advised he would do same.   I discussed again with Austin [name] the importance of abstaining from alcohol and restrict water. Austin [name] missed Friday and Saturday doses of his AM/PM meds. He states he's had some "things going on in the family" and that has caused him to miss doses.  I encouraged him to be diligent in taking his meds in order to strengthen his heart.   Med box reconciled x 1 week.  Refills called in to pharmacy and pt. Advises he will pick them up on 09/12/23.  ACTION: Home visit completed  Bethanie Dicker [phone] 09/10/23  Patient Care Team: [name], FNP as PCP - General (General Practice) Little Ishikawa, MD as PCP - Cardiology (Cardiology)  Patient Active Problem List   Diagnosis Date Noted   Transient hypotension 06/05/2023   Alcohol use disorder 06/05/2023   Hypertensive 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 (HCC) 06/05/2023   Elevated brain natriuretic peptide (BNP) level 06/05/2023   Elevated LFTs 05/18/2023   Prolonged QT interval 05/18/2023   Hyponatremia 05/17/2023   Iron deficiency anemia 09/03/2020   NICM (nonischemic cardiomyopathy) (HCC)    ETOH abuse 11/10/2018   Essential  hypertension 11/10/2018   Blood in stool 11/10/2018    Current Outpatient Medications:    empagliflozin (JARDIANCE) 10 MG TABS tablet, Take 1 tablet (10 mg total) by mouth daily before breakfast., Disp: 30 tablet, Rfl: 6   metoprolol succinate (TOPROL-XL) 25 MG 24 hr tablet, Take 1 tablet (25 mg total) by mouth daily. Take with or immediately following a meal., Disp: 90 tablet, Rfl: 3   sacubitril-valsartan (ENTRESTO) 49-51 MG, Take 1 tablet by mouth 2 (two) times daily., Disp: 60 tablet, Rfl: 6   spironolactone (ALDACTONE) 25 MG tablet, Take 1 tablet (25 mg total) by mouth daily., Disp: 90 tablet, Rfl: 3   Multiple Vitamin (MULTIVITAMIN) tablet, Take 1 tablet by mouth daily. (Patient not taking: Reported on 09/10/2023), Disp: , Rfl:  Allergies  Allergen Reactions   Latex Swelling and Rash     Social History   Socioeconomic History   Marital status: Married    Spouse name: Not on file   Number of children: 2   Years of education: Not on file   Highest education level: Bachelor's degree (e.g., BA, AB, BS)  Occupational History   Not on file  Tobacco Use   Smoking status: Never   Smokeless tobacco: Never  Vaping Use   Vaping status: Never Used  Substance and Sexual Activity   Alcohol use: Yes    Comment: 1-3 beers daily   Drug use: No   Sexual activity: Yes  Other Topics Concern  Not on file  Social History Narrative   Not on file   Social Determinants of Health   Financial Resource Strain: Low Risk  (02/27/2022)   Overall Financial Resource Strain (CARDIA)    Difficulty of Paying Living Expenses: Not hard at all  Food Insecurity: No Food Insecurity (05/21/2023)   Hunger Vital Sign    Worried About Running Out of Food in the Last Year: Never true    Ran Out of Food in the Last Year: Never true  Transportation Needs: No Transportation Needs (06/18/2023)   PRAPARE - Administrator, Civil Service (Medical): No    Lack of Transportation (Non-Medical): No   Physical Activity: Inactive (02/27/2022)   Exercise Vital Sign    Days of Exercise per Week: 0 days    Minutes of Exercise per Session: 0 min  Stress: No Stress Concern Present (02/27/2022)   [employer] of Occupational Health - Occupational Stress Questionnaire    Feeling of Stress : Only a little  Social Connections: Socially Integrated (12/06/2020)   Social Connection and Isolation Panel [NHANES]    Frequency of Communication with Friends and Family: More than three times a week    Frequency of Social Gatherings with Friends and Family: More than three times a week    Attends Religious Services: More than 4 times per year    Active Member of Clubs or Organizations: Yes    Attends Banker Meetings: More than 4 times per year    Marital Status: Married  Catering manager Violence: Not At Risk (05/17/2023)   Humiliation, Afraid, Rape, and Kick questionnaire    Fear of Current or Ex-Partner: No    Emotionally Abused: No    Physically Abused: No    Sexually Abused: No    Physical Exam      Future Appointments  Date Time Provider Department Center  09/25/2023 10:00 AM Laurey Morale, MD [facility] None

## 2023-09-17 ENCOUNTER — Telehealth (HOSPITAL_COMMUNITY): Payer: Self-pay | Admitting: Licensed Clinical Social Worker

## 2023-09-17 ENCOUNTER — Other Ambulatory Visit (HOSPITAL_COMMUNITY): Payer: Self-pay

## 2023-09-17 ENCOUNTER — Other Ambulatory Visit (HOSPITAL_COMMUNITY): Payer: Self-pay | Admitting: Emergency Medicine

## 2023-09-17 MED ORDER — EMPAGLIFLOZIN 10 MG PO TABS: 10.0000 mg | ORAL_TABLET | Freq: Every day | ORAL | 3 refills | Status: DC

## 2023-09-17 NOTE — Telephone Encounter (Signed)
HF Paramedicine Team Based Care Meeting  HF MD- NA  HF NP - Amy Clegg NP-C   [facility] HF Paramedicine  Katie Lynch [employer]  Dede Smith  [facility] admit within the last 30 days for heart failure? no  Medications concerns? Continues to not take medications when he feels like it but seems to have good understanding of his medications and how to take them.  SDOH - has started working, doesn't seem to be drinking as heavily  Eligible for discharge? Has appt this month with MD- will see how he looks at that time and consider DC soon if all looks well.  Burna Sis, LCSW Clinical Social Worker [facility] Desk#: [phone] Cell#: [phone]

## 2023-09-17 NOTE — Progress Notes (Unsigned)
Paramedicine Encounter    Patient ID: Austin [name], male    DOB: August 02, 1956, 67 y.o.   MRN: [id number]   Complaints NONE  Assessment A&O x 4, skin W&D w/ good color, No chest   Compliance with meds***  Pill box filled***  Refills needed***  Meds changes since last visit NONE    Social changes NONE   BP 122/80   Pulse 86   Resp 16   Wt 148 lb (67.1 kg)   SpO2 98%   BMI 21.86 kg/m  Weight yesterday-not taken Last visit weight-146lb  ACTION: {Paramed Action:[id number]}  Beatrix Shipper, EMT-Paramedic [phone] 09/17/23  Patient Care Team: [name], FNP as PCP - General (General Practice) Little Ishikawa, MD as PCP - Cardiology (Cardiology)  Patient Active Problem List   Diagnosis Date Noted   Transient hypotension 06/05/2023   Alcohol use disorder 06/05/2023   Hypertensive 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 Take with or immediately following a meal., Disp: 90 tablet, Rfl: 3   sacubitril-valsartan (ENTRESTO) 49-51 MG, Take 1 tablet by mouth 2 (two) times daily., Disp: 60 tablet, Rfl: 6   spironolactone (ALDACTONE) 25 MG tablet, Take 1 tablet (25 mg total) by mouth daily., Disp: 90 tablet, Rfl: 3   Multiple Vitamin (MULTIVITAMIN) tablet, Take 1 tablet by mouth daily. (Patient not taking: Reported on  09/10/2023), Disp: , Rfl:  Allergies  Allergen Reactions   Latex Swelling and Rash     Social History   Socioeconomic History   Marital status: Married    Spouse name: Not on file   Number of children: 2   Years of education: Not on file   Highest education level: Bachelor's degree (e.g., BA, AB, BS)  Occupational History   Not on file  Tobacco Use   Smoking status: Never   Smokeless tobacco: Never  Vaping Use   Vaping status: Never Used  Substance and Sexual Activity   Alcohol use: Yes    Comment: 1-3 beers daily   Drug use: No   Sexual activity: Yes  Other Topics Concern   Not on file  Social History Narrative   Not on file   Social Determinants of Health   Financial Resource Strain: Low Risk  (02/27/2022)   Overall Financial Resource Strain (CARDIA)    Difficulty of Paying Living Expenses: Not hard at all  Food Insecurity: No Food Insecurity (05/21/2023)   Hunger Vital Sign    Worried About Running Out of Food in the Last Year: Never true    Ran Out of Food in the Last Year: Never true  Transportation Needs: No Transportation Needs (06/18/2023)   PRAPARE - Administrator, Civil Service (Medical): No    Lack of Transportation (Non-Medical): No  Physical Activity: Inactive (02/27/2022)   Exercise Vital Sign    Days of Exercise per Week: 0  days    Minutes of Exercise per Session: 0 min  Stress: No Stress Concern Present (02/27/2022)   [employer] of Occupational Health - Occupational Stress Questionnaire    Feeling of Stress : Only a little  Social Connections: Socially Integrated (12/06/2020)   Social Connection and Isolation Panel [NHANES]    Frequency of Communication with Friends and Family: More than three times a week    Frequency of Social Gatherings with Friends and Family: More than three times a week    Attends Religious Services: More than 4 times per year    Active Member of Clubs or Organizations: Yes    Attends Banker  Meetings: More than 4 times per year    Marital Status: Married  Catering manager Violence: Not At Risk (05/17/2023)   Humiliation, Afraid, Rape, and Kick questionnaire    Fear of Current or Ex-Partner: No    Emotionally Abused: No    Physically Abused: No    Sexually Abused: No    Physical Exam      Future Appointments  Date Time Provider Department Center  09/25/2023 10:00 AM Laurey Morale, MD [facility] None

## 2023-09-25 ENCOUNTER — Encounter (HOSPITAL_COMMUNITY): Payer: Self-pay | Admitting: Cardiology

## 2023-09-25 ENCOUNTER — Other Ambulatory Visit (HOSPITAL_COMMUNITY): Payer: Self-pay | Admitting: Emergency Medicine

## 2023-09-25 ENCOUNTER — Ambulatory Visit (HOSPITAL_COMMUNITY)
Admission: RE | Admit: 2023-09-25 | Discharge: 2023-09-25 | Disposition: A | Discharge status: Home / Self Care | Payer: Medicare Other | Source: Ambulatory Visit | Attending: Cardiology | Admitting: Cardiology

## 2023-09-25 VITALS — BP 130/70 | HR 87 | Wt 149.0 lb

## 2023-09-25 DIAGNOSIS — I110 Hypertensive heart disease with heart failure: Secondary | ICD-10-CM | POA: Diagnosis not present

## 2023-09-25 DIAGNOSIS — F1010 Alcohol abuse, uncomplicated: Secondary | ICD-10-CM | POA: Diagnosis not present

## 2023-09-25 DIAGNOSIS — I5022 Chronic systolic (congestive) heart failure: Secondary | ICD-10-CM | POA: Diagnosis not present

## 2023-09-25 DIAGNOSIS — I428 Other cardiomyopathies: Secondary | ICD-10-CM | POA: Insufficient documentation

## 2023-09-25 DIAGNOSIS — E871 Hypo-osmolality and hyponatremia: Secondary | ICD-10-CM | POA: Insufficient documentation

## 2023-09-25 LAB — BASIC METABOLIC PANEL
Anion gap: 11 (ref 5–15)
BUN: 8 mg/dL (ref 8–23)
CO2: 25 mmol/L (ref 22–32)
Calcium: 9.1 mg/dL (ref 8.9–10.3)
Chloride: 90 mmol/L — ABNORMAL LOW (ref 98–111)
Creatinine, Ser: 0.81 mg/dL (ref 0.61–1.24)
GFR, Estimated: >60 mL/min (ref >60)
Glucose, Bld: 91 mg/dL (ref 70–99)
Potassium: 4.3 mmol/L (ref 3.5–5.1)
Sodium: 126 mmol/L — ABNORMAL LOW (ref 135–145)

## 2023-09-25 LAB — BRAIN NATRIURETIC PEPTIDE: B Natriuretic Peptide: 430.2 pg/mL — ABNORMAL HIGH (ref 0.0–100.0)

## 2023-09-25 MED ORDER — METOPROLOL SUCCINATE ER 25 MG PO TB24: 50.0000 mg | ORAL_TABLET | Freq: Every day | ORAL | 3 refills | Status: DC

## 2023-09-25 NOTE — Progress Notes (Signed)
ADVANCED HF CLINIC NOTE  PCP: [name], FNP Cardiology: Dr. Bjorn Pippin HF Cardiology: Dr. Shirlee Latch  68 y.o. with history of ETOH abuse and prior HTN, and nonischemic cardiomyopathy.   In 2/21, he developed exertional dyspnea and was noted at an urgent care center to have CHF.  Echo 3/21 EF < 20% with severe RV dysfunction.  RHC/LHC in 3/21 showed no CAD but R > L filling pressure elevation and preserved cardiac index.  He was admitted 4/21 with acute/chronic systolic CHF.  He was diuresed and cardiac meds were adjusted.  Cardiac MRI  4/21 EF 14%, moderate LV dilation, severely decreased RV function, and moderate pericardial effusion. There was no evidence for myocarditis noted.   Patient, of note, has a history of heavy ETOH intake for decades. Per his wife, he would drink heavily every day after getting home from work and especially heavily on the weekend. He has a history of ETOH withdrawal.  He does have some family history of CHF (sister, maybe his mother), but he does not have a lot of detail about these diagnoses. No drugs (cocaine, amphetamines).   Echo 8/21 EF 30-35%, diffuse hypokinesis, normal RV, no pericardial effusion. Echo in 10/21 showed EF 45-50% with global hypokinesis.  Echo in 3/22 with EF 45-50%, normal RV, PASP 34 mmHg. TEE in 5/23 showed EF 50-55%, normal RV, trivial MR, mobile RA structure seen on TTE was [facility] network.   Admitted 6/24 with weakness, 2/2 N/V/D x several days prior. Ethanol level 38. Na 118, nephrology consulted and he received IVF, GDMT held. He continued to drink ETOH while admitted, sustained a fall during admission and hit his head, CT head negative. He was discharged home on prior GDMT except spironolactone, weight 150 lbs.  Sent to the ED 05/26/23 after AHF follow-up.  At appt patient's BP was 60s/50s.  In the ED SBP in 70s.  Responded well to 1 L IVF, refused to get admitted, sent home with improvement in BP.  Suspect volume depleted with high alcohol  intake, ethanol level 140.    Echo 8/24 EF 25-30%, GIDD, RV normal. Suspected drop in EF 2/2 med noncompliance and ETOH.   Today he returns for AHF follow up with his paramedic.  He is still drinking but has cut back; drinks about 4 beers/day.  He works part-time at [employer].  SBP running in 120s at home.  Weight up 2 lbs.  He has been taking all his meds.  He reports no exertional dyspnea or chest pain.  No orthopnea/PND.  No lightheadedness.    ECG: NSR, LBBB 154 msec  Labs (4/21): HIV negative, K 4.2, creatinine 0.93 Labs (5/21): K 4.2, creatinine 1.23, digoxin 0.6 Labs (7/21): K 3.8, creatinine 1.49 Labs (10/21): K 4.2, creatinine 1.25, BNP 14 Labs (12/21): K 4.3, creatinine 1.14 Labs (3/22): K 4.4, creatinine 1.34 Labs (7/22): K 3.9, creatinine 1.19 Labs (3/23): K 4.6, creatinine 1.29 Labs (1/24): LDL 48, K 4.2, creatinine 0.86 Labs (6/24): K 3.2, creatinine 1.11 Labs (6/24): K3.7, SCR 1.21, BNP 1200, ethanol 140 Labs (7/24): K 4.4, creatinine 1.15  Labs (8/24): K 4, SCr 0.91 Labs (10/24): Na 126, K 4.4, creatinine 0.81  PMH: 1. Prostate cancer 2. HTN: BP now low.  3. Fe deficiency anemia 4. ETOH abuse 5. GERD 6. Chronic systolic CHF: Nonischemic cardiomyopathy.  - Echo 3/21 with EF < 20%, severe global HK, severely dilated and severely dysfunctional RV, severe biatrial enlargement.  - RHC/LHC (3/21): No CAD; mean RA 15,  PA 51/24, mean PCWP 19, CI 3.96.  - HIV negative 4/21 - Cardiac MRI (4/21): LV EF 14%, moderate LV dilation, mild RV dilation with severely decreased systolic function, moderate pericardial effusion, no LGE noted but images difficult due to recent Fe infusion.  - Echo (8/21): EF 30-35%, diffuse hypokinesis, normal RV, no pericardial fluid.  - Echo (10/21): EF 45-50%, diffuse hypokinesis, normal RV.  - Echo (3/22): EF 45-50%, normal RV, PASP 34 mmHg.  - TEE (5/23): EF 50-55%, normal RV, trivial MR, mobile RA structure seen on TTE was [facility] network.  - Echo  (8/24): EF 25-30%, GIDD, RV normal   FH: Sister with CHF, brother with pacemaker, mother with CHF.   SH: Married, lives in [place], heavy ETOH in the past has now cut back, no drugs.  He was a Estate agent.   ROS: All systems reviewed and negative except as per HPI.   Current Outpatient Medications  Medication Sig Dispense Refill   empagliflozin (JARDIANCE) 10 MG TABS tablet Take 1 tablet (10 mg total) by mouth daily before breakfast. 90 tablet 3   Multiple Vitamin (MULTIVITAMIN) tablet Take 1 tablet by mouth daily.     sacubitril-valsartan (ENTRESTO) 49-51 MG Take 1 tablet by mouth 2 (two) times daily. 60 tablet 6   spironolactone (ALDACTONE) 25 MG tablet Take 1 tablet (25 mg total) by mouth daily. 90 tablet 3   metoprolol succinate (TOPROL-XL) 25 MG 24 hr tablet Take 2 tablets (50 mg total) by mouth daily. Take with or immediately following a meal. 180 tablet 3   No current facility-administered medications for this encounter.   Wt Readings from Last 3 Encounters:  09/25/23 67.6 kg (149 lb)  09/25/23 67.6 kg (149 lb)  09/17/23 67.1 kg (148 lb)   BP 130/70   Pulse 87   Wt 67.6 kg (149 lb)   SpO2 99%   BMI 22.00 kg/m  Physical Exam General: NAD Neck: No JVD, no thyromegaly or thyroid nodule.  Lungs: Clear to auscultation bilaterally with normal respiratory effort. CV: Nondisplaced PMI.  Heart regular S1/S2, no S3/S4, no murmur.  No peripheral edema.  No carotid bruit.  Normal pedal pulses.  Abdomen: Soft, nontender, no hepatosplenomegaly, no distention.  Skin: Intact without lesions or rashes.  Neurologic: Alert and oriented x 3.  Psych: Normal affect. Extremities: No clubbing or cyanosis.  HEENT: Normal.   Assessment/Plan: 1. Chronic systolic CHF: Nonischemic cardiomyopathy, possibly due to ETOH.  Echo 3/21 showed EF 20% with moderately decreased RV systolic function by my read.  RHC/LHC from 3/21 showed no significant coronary disease, preserved cardiac output, R>L  heart failure.  Cardiac MRI in 4/21 showed EF 14%, severe RV dysfunction, no LGE.  HIV negative.  No LGE noted on cardiac MRI but difficult LGE images, cannot rule out prior myocarditis.  Family history is not fully defined.  He has not been noted to have frequent PVCs.  Echo in 10/21 showed EF improved to 45-50%, echo in 4/22 showed stable EF 45-50%.  TEE in 5/23 showed EF 50-55%, normal RV, trivial MR, mobile RA structure seen on TTE was [facility] network. Echo in 8/24 with drop in EF to 25-30% in setting of heavy ETOH intake and noncompliance with meds. He has cut back some on ETOH but is still drinking.  He is now taking all his meds.  Not volume overloaded on exam, NYHA class I-II.  - Continue Entresto 49-51 mg bid.  Will not increase as BP has been low at times.  -  Increase Toprol XL to 50 mg daily.  - Continue Jardiance 10 mg daily.  - Continue spironolactone 25 mg daily  - Check BMP and BNP today.  - Encouraged to continue to cut back on ETOH use  - Repeat echo in Dec/Jan after staying compliant with meds to reassess EF.  If EF remains low and he is taking his medications and keeping ETOH intake low, he will qualify for CRT-D implantation with wide LBBB.  2. ETOH abuse: Long history of heavy ETOH.  This may be the cause of his cardiomyopathy.  He is still drinking but has cut back.  - I strongly encouraged him to cut back further.   3. Hyponatremia: In setting of heavy beer intake.  - He needs to cut back on po fluid intake.   F/u with APP in 1 month.   Marca Ancona  09/25/2023 [facility] [place] [address] [facility] [place] [address] [phone] (office) [phone] (fax)

## 2023-09-25 NOTE — Patient Instructions (Addendum)
CUT BACK ON THE BEER!!!  INCREASE Toprol XL to 50 mg daily.  Labs done today, your results will be available in MyChart, we will contact you for abnormal readings. Your physician has requested that you have an echocardiogram. Echocardiography is a painless test that uses sound waves to create images of your heart. It provides your doctor with information about the size and shape of your heart and how well your heart's chambers and valves are working. This procedure takes approximately one hour. There are no restrictions for this procedure. Please do NOT wear cologne, perfume, aftershave, or lotions (deodorant is allowed). Please arrive 15 minutes prior to your appointment time.  Your physician recommends that you schedule a follow-up appointment in: 1 month.  If you have any questions or concerns before your next appointment please send [place] a message through [place] or call our office at [phone].    TO LEAVE A MESSAGE FOR THE NURSE SELECT OPTION 2, PLEASE LEAVE A MESSAGE INCLUDING: YOUR NAME DATE OF BIRTH CALL BACK NUMBER REASON FOR CALL**this is important as we prioritize the call backs  YOU WILL RECEIVE A CALL BACK THE SAME DAY AS LONG AS YOU CALL BEFORE 4:00 PM  At the [facility], you and your health needs are our priority. As part of our continuing mission to provide you with exceptional heart care, we have created designated Provider Care Teams. These Care Teams include your primary Cardiologist (physician) and Advanced Practice Providers (APPs- Physician Assistants and Nurse Practitioners) who all work together to provide you with the care you need, when you need it.   You may see any of the following providers on your designated Care Team at your next follow up: Dr Arvilla Meres Dr Marca Ancona Dr. Dorthula Nettles Dr. Clearnce Hasten Amy Filbert Schilder, NP Robbie Lis, [place] [facility] [place], [place] Brynda Peon, NP [place] Lee, NP Karle Plumber,  PharmD   Please be sure to bring in all your medications bottles to every appointment.    Thank you for choosing [place] [facility]

## 2023-09-25 NOTE — Progress Notes (Signed)
Paramedicine Encounter   Patient ID: Austin [name] , male,   DOB: [birth year],66 y.o.,  MRN: [id number]   Met patient in clinic today with provider.   [id number]@ clinic- 149lb [id number]   Med changes today (if any) : increased  Metoprolol 50mg  daily.    Reconciled med box x 1 week to reflect med change.   Beatrix Shipper, EMT-Paramedic [phone] 09/25/2023

## 2023-09-30 ENCOUNTER — Other Ambulatory Visit (HOSPITAL_COMMUNITY): Payer: Self-pay | Admitting: Cardiology

## 2023-09-30 MED ORDER — SPIRONOLACTONE 25 MG PO TABS: 25.0000 mg | ORAL_TABLET | Freq: Every day | ORAL | 3 refills | Status: DC

## 2023-10-01 ENCOUNTER — Telehealth (HOSPITAL_COMMUNITY): Payer: Self-pay | Admitting: Emergency Medicine

## 2023-10-01 NOTE — Telephone Encounter (Signed)
Pt called to advise he did not have time for a visit today.  He was out voting and is going to work after same.  He advised he has all his meds and rescheduled for next week.    Beatrix Shipper, EMT-Paramedic [phone] 10/01/2023

## 2023-10-07 ENCOUNTER — Telehealth (HOSPITAL_COMMUNITY): Payer: Self-pay | Admitting: Emergency Medicine

## 2023-10-07 NOTE — Telephone Encounter (Signed)
Spoke to Mrs. Ladona Ridgel and scheduled home visit for Austin [name] [facility] Thursday 10/31 @ 10:30.  She advised me she would let him know same.    Beatrix Shipper, EMT-Paramedic [phone] 10/07/2023

## 2023-10-09 ENCOUNTER — Telehealth (HOSPITAL_COMMUNITY): Payer: Self-pay | Admitting: Emergency Medicine

## 2023-10-09 NOTE — Telephone Encounter (Signed)
Called @ 10:10 this morning to confirm home visit today.  Pt's wife advised Mr. Swalley was not home and was sitting with her as she has been admitted to the hospital.  Pt. Advises he will call me when he is available for a visit.    Beatrix Shipper, EMT-Paramedic [phone] 10/09/2023

## 2023-10-27 ENCOUNTER — Encounter (HOSPITAL_COMMUNITY): Payer: Medicare Other

## 2023-10-28 ENCOUNTER — Ambulatory Visit: Payer: Medicare Other

## 2023-10-28 DIAGNOSIS — J069 Acute upper respiratory infection, unspecified: Secondary | ICD-10-CM | POA: Diagnosis not present

## 2023-10-28 DIAGNOSIS — A084 Viral intestinal infection, unspecified: Secondary | ICD-10-CM | POA: Diagnosis not present

## 2023-10-29 ENCOUNTER — Telehealth (HOSPITAL_COMMUNITY): Payer: Self-pay | Admitting: Emergency Medicine

## 2023-10-29 NOTE — Telephone Encounter (Signed)
Mrs. Sapp called to advise that Austin [name] was seen at an urgent care and has a virus.  He does not wish to have a visit this week.  I haven't been able to see him in a while due to him taking care of his wife who was in the hospital for a while with severe diverticulitis.  She advised that she did reschedule his appointment and I will work to try to set up a home visit next week.    Beatrix Shipper, EMT-Paramedic [phone] 10/29/2023

## 2023-11-04 ENCOUNTER — Other Ambulatory Visit (HOSPITAL_COMMUNITY): Payer: Self-pay | Admitting: Emergency Medicine

## 2023-11-04 ENCOUNTER — Telehealth (HOSPITAL_COMMUNITY): Payer: Self-pay | Admitting: Emergency Medicine

## 2023-11-04 NOTE — Progress Notes (Signed)
Paramedicine Encounter    Patient ID: Austin [name], male    DOB: May 30, 1956, 67 y.o.   MRN: [id number]   Complaints  Productive cough  Assessment A&O x 4.  Pt has been sick w a virus recently w/ diarrhea and vomiting last week.  Continues w/ productive cough and appears obviously SOB but denies that he is.  Compliance with meds No x at least a week.   Pill box filled x 1 week  Refills needed none  Meds changes since last visit NONE    Social changes NONE   BP 112/84 (BP Location: Right Arm, Patient Position: Sitting, Cuff Size: Normal)   Pulse 100   Resp (!) 38   Wt 147 lb (66.7 kg)   BMI 21.71 kg/m  Weight yesterday-Not taken Last visit weight-149lb (09/25/23)  Last home visit w/ Austin [name] 09/17/23.  He has refused home visits for the past couple of weeks. Once while staying in the hospital w/ his wife as a caregiver as she  was hospitalized for diverticulitis.  And the next week due to being diagnosed at local urgent care for a stomach virus.  He advised me at that time that he was taking his meds.  Prior to arrival for today's visit pt's wife spoke to me over the phone and requested that I make Austin [name] take his meds in front of me during the visit.  She confirmed his non-compliance.   Today Austin [name] look weak and appeared to be SOB.  He tells me that he is not SOB but his respirations appeared labored and somewhat shallow.  I advised pt he seek further medical evaluation at the hospital which he refused.  His wife was able to convince him to return the the urgent care today and was able to get an appointment @ 11:00.   Discussed w/ pt the need to get back on track with his meds.  Pill box reconciled x 1 week w/o incident. Advised pt to reach out to 911 or myself should he have further needs or questions.  Advised pt and his wife that the [facility] Clinic will be closed Thurs & Friday due to the Thanksgiving holiday and to call 911 should he require further assistance during the  long weekend and he advises he understands same.  ACTION: Home visit completed  Bethanie Dicker [phone] 11/04/23  Patient Care Team: [name], FNP as PCP - General (General Practice) Little Ishikawa, MD as PCP - Cardiology (Cardiology)  Patient Active Problem List   Diagnosis Date Noted   Transient hypotension 06/05/2023   Alcohol use disorder 06/05/2023   Hypertensive 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 (HCC) 06/05/2023   Elevated brain natriuretic peptide (BNP) level 06/05/2023   Elevated LFTs 05/18/2023   Prolonged QT interval 05/18/2023   Hyponatremia 05/17/2023   Iron deficiency anemia 09/03/2020   NICM (nonischemic cardiomyopathy) (HCC)    ETOH abuse 11/10/2018   Essential hypertension 11/10/2018   Blood in stool 11/10/2018    Current Outpatient Medications:    empagliflozin (JARDIANCE) 10 MG TABS tablet, Take 1 tablet (10 mg total) by mouth daily before breakfast., Disp: 90 tablet, Rfl: 3   metoprolol succinate (TOPROL-XL) 25 MG 24 hr tablet, Take 2 tablets (50 mg total) by mouth daily. Take with or immediately following a meal., Disp: 180 tablet, Rfl: 3   Multiple Vitamin (MULTIVITAMIN) tablet, Take 1 tablet by mouth daily., Disp: , Rfl:    sacubitril-valsartan (ENTRESTO)  49-51 MG, Take 1 tablet by mouth 2 (two) times daily., Disp: 60 tablet, Rfl: 6   spironolactone (ALDACTONE) 25 MG tablet, Take 1 tablet (25 mg total) by mouth daily., Disp: 90 tablet, Rfl: 3 Allergies  Allergen Reactions   Latex Swelling and Rash     Social History   Socioeconomic History   Marital status: Married    Spouse name: Not on file   Number of children: 2   Years of education: Not on file   Highest education level: Bachelor's degree (e.g., BA, AB, BS)  Occupational History   Not on file  Tobacco Use   Smoking status: Never   Smokeless tobacco: Never  Vaping Use   Vaping status: Never Used  Substance  and Sexual Activity   Alcohol use: Yes    Comment: 1-3 beers daily   Drug use: No   Sexual activity: Yes  Other Topics Concern   Not on file  Social History Narrative   Not on file   Social Determinants of Health   Financial Resource Strain: Low Risk  (02/27/2022)   Overall Financial Resource Strain (CARDIA)    Difficulty of Paying Living Expenses: Not hard at all  Food Insecurity: No Food Insecurity (05/21/2023)   Hunger Vital Sign    Worried About Running Out of Food in the Last Year: Never true    Ran Out of Food in the Last Year: Never true  Transportation Needs: No Transportation Needs (06/18/2023)   PRAPARE - Administrator, Civil Service (Medical): No    Lack of Transportation (Non-Medical): No  Physical Activity: Inactive (02/27/2022)   Exercise Vital Sign    Days of Exercise per Week: 0 days    Minutes of Exercise per Session: 0 min  Stress: No Stress Concern Present (02/27/2022)   [employer] of Occupational Health - Occupational Stress Questionnaire    Feeling of Stress : Only a little  Social Connections: Socially Integrated (12/06/2020)   Social Connection and Isolation Panel [NHANES]    Frequency of Communication with Friends and Family: More than three times a week    Frequency of Social Gatherings with Friends and Family: More than three times a week    Attends Religious Services: More than 4 times per year    Active Member of Clubs or Organizations: Yes    Attends Banker Meetings: More than 4 times per year    Marital Status: Married  Catering manager Violence: Not At Risk (05/17/2023)   Humiliation, Afraid, Rape, and Kick questionnaire    Fear of Current or Ex-Partner: No    Emotionally Abused: No    Physically Abused: No    Sexually Abused: No    Physical Exam      Future Appointments  Date Time Provider Department Center  11/12/2023 11:00 AM [facility] OP 1 [facility]  11/21/2023  8:30 AM [facility] PA/NP [facility] None

## 2023-11-04 NOTE — Telephone Encounter (Signed)
Mr. Bodkins wife called to advise that Mr. Goedert has been diagnosed with pneumonia at the Urgent Care and received shot at office and more meds called into the pharmacy.  Encouraged Mrs. Friedrichsen to keep an eye on him and to continue to make sure he is taking his meds.  I will call and check on him tomorrow.    Beatrix Shipper, EMT-Paramedic [phone] 11/04/2023

## 2023-11-11 ENCOUNTER — Telehealth (HOSPITAL_COMMUNITY): Payer: Self-pay | Admitting: Emergency Medicine

## 2023-11-11 NOTE — Telephone Encounter (Signed)
Called to remind Mrs. Faas about today's home visit.  She states that Mr. Cornwell is not home and is at another doctor's appointment for follow up from recent sickness.  She stated that he would like to move his appointment to Friday 12/6 @ 8:30.    Beatrix Shipper, EMT-Paramedic [phone] 11/11/2023

## 2023-11-12 ENCOUNTER — Ambulatory Visit (HOSPITAL_COMMUNITY)
Admission: RE | Admit: 2023-11-12 | Discharge: 2023-11-12 | Disposition: A | Discharge status: Home / Self Care | Payer: Medicare Other | Source: Ambulatory Visit | Attending: Nurse Practitioner | Admitting: Nurse Practitioner

## 2023-11-12 DIAGNOSIS — I5042 Chronic combined systolic (congestive) and diastolic (congestive) heart failure: Secondary | ICD-10-CM | POA: Diagnosis not present

## 2023-11-12 DIAGNOSIS — I110 Hypertensive heart disease with heart failure: Secondary | ICD-10-CM | POA: Insufficient documentation

## 2023-11-12 DIAGNOSIS — I081 Rheumatic disorders of both mitral and tricuspid valves: Secondary | ICD-10-CM | POA: Insufficient documentation

## 2023-11-12 DIAGNOSIS — I429 Cardiomyopathy, unspecified: Secondary | ICD-10-CM | POA: Insufficient documentation

## 2023-11-12 LAB — ECHOCARDIOGRAM COMPLETE
AR max vel: 2.36 cm2
AV Area VTI: 1.89 cm2
AV Area mean vel: 2.13 cm2
AV Mean grad: 1 mm[Hg]
AV Peak grad: 2.1 mm[Hg]
Ao pk vel: 0.72 m/s
Area-P 1/2: 6.27 cm2
Calc EF: 35.6 %
MV M vel: 3.49 m/s
MV Peak grad: 48.7 mm[Hg]
MV VTI: 1.95 cm2
S' Lateral: 4.6 cm
Single Plane A2C EF: 32.6 %
Single Plane A4C EF: 32.4 %

## 2023-11-12 NOTE — Progress Notes (Signed)
  Echocardiogram 2D Echocardiogram has been performed.  Ocie Doyne RDCS 11/12/2023, 11:55 AM

## 2023-11-13 ENCOUNTER — Other Ambulatory Visit (HOSPITAL_COMMUNITY): Payer: Self-pay | Admitting: Emergency Medicine

## 2023-11-13 NOTE — Progress Notes (Signed)
Paramedicine Encounter    Patient ID: Austin [name], male    DOB: [birth year], 67 y.o.   MRN: [id number]   Complaints NONE  Assessment A&O x 4, skin W&D w/ good color.  Lung sounds w/ ronchi in the upper lobes.  Has a lingering cough from recent pneumonia  Compliance with meds NO  Missed Sunday all am meds, pm Entresto, and Mon & Wed. Entresto doses.  Has no reason behind missed doses.  Pill box filled x 1 week  Refills needed NONE  Meds changes since last visit NONE    Social changes NONE   BP 118/78 (BP Location: Left Arm, Patient Position: Sitting, Cuff Size: Normal)   Pulse (!) 108   Resp 16   Wt 144 lb 6.4 oz (65.5 kg)   SpO2 98%   BMI 21.32 kg/m  Weight yesterday- not taken Last visit weight-147lb  Today Austin [name] reports to be tired as he is still dealing with residual cough from recent pneumonia which he has been treated for.  He is afebrile.  Productive cough w/ thick secretions yellow in color.  He denies chest pain or SOB.  No edema noted.  Wife tells me that her her husband is not taking his meds and has been drinking quite a lot again.  Multiple tall beer cans noted in the trash can in the kitchen.  I advised him that if he continues to be non-compliant with his meds that I may need to discharge him.  He tells me that he wants me to continue to come and I will continue to monitor his med compliance. Next home visit 12/12 @ 10:00.    ACTION: Home visit completed  Bethanie Dicker [phone] 11/13/23  Patient Care Team: [name], FNP as PCP - General (General Practice) Little Ishikawa, MD as PCP - Cardiology (Cardiology)  Patient Active Problem List   Diagnosis Date Noted   Transient hypotension 06/05/2023   Alcohol use disorder 06/05/2023   Hypertensive 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 (HCC) 06/05/2023   Elevated brain natriuretic peptide (BNP) level 06/05/2023   Elevated  LFTs 05/18/2023   Prolonged QT interval 05/18/2023   Hyponatremia 05/17/2023   Iron deficiency anemia 09/03/2020   NICM (nonischemic cardiomyopathy) (HCC)    ETOH abuse 11/10/2018   Essential hypertension 11/10/2018   Blood in stool 11/10/2018    Current Outpatient Medications:    empagliflozin (JARDIANCE) 10 MG TABS tablet, Take 1 tablet (10 mg total) by mouth daily before breakfast., Disp: 90 tablet, Rfl: 3   metoprolol succinate (TOPROL-XL) 25 MG 24 hr tablet, Take 2 tablets (50 mg total) by mouth daily. Take with or immediately following a meal., Disp: 180 tablet, Rfl: 3   Multiple Vitamin (MULTIVITAMIN) tablet, Take 1 tablet by mouth daily., Disp: , Rfl:    sacubitril-valsartan (ENTRESTO) 49-51 MG, Take 1 tablet by mouth 2 (two) times daily., Disp: 60 tablet, Rfl: 6   spironolactone (ALDACTONE) 25 MG tablet, Take 1 tablet (25 mg total) by mouth daily., Disp: 90 tablet, Rfl: 3 Allergies  Allergen Reactions   Latex Swelling and Rash     Social History   Socioeconomic History   Marital status: Married    Spouse name: Not on file   Number of children: 2   Years of education: Not on file   Highest education level: Bachelor's degree (e.g., BA, AB, BS)  Occupational History   Not on file  Tobacco Use   Smoking  status: Never   Smokeless tobacco: Never  Vaping Use   Vaping status: Never Used  Substance and Sexual Activity   Alcohol use: Yes    Comment: 1-3 beers daily   Drug use: No   Sexual activity: Yes  Other Topics Concern   Not on file  Social History Narrative   Not on file   Social Determinants of Health   Financial Resource Strain: Low Risk  (02/27/2022)   Overall Financial Resource Strain (CARDIA)    Difficulty of Paying Living Expenses: Not hard at all  Food Insecurity: No Food Insecurity (05/21/2023)   Hunger Vital Sign    Worried About Running Out of Food in the Last Year: Never true    Ran Out of Food in the Last Year: Never true  Transportation Needs:  No Transportation Needs (06/18/2023)   PRAPARE - Administrator, Civil Service (Medical): No    Lack of Transportation (Non-Medical): No  Physical Activity: Inactive (02/27/2022)   Exercise Vital Sign    Days of Exercise per Week: 0 days    Minutes of Exercise per Session: 0 min  Stress: No Stress Concern Present (02/27/2022)   [employer] of Occupational Health - Occupational Stress Questionnaire    Feeling of Stress : Only a little  Social Connections: Socially Integrated (12/06/2020)   Social Connection and Isolation Panel [NHANES]    Frequency of Communication with Friends and Family: More than three times a week    Frequency of Social Gatherings with Friends and Family: More than three times a week    Attends Religious Services: More than 4 times per year    Active Member of Clubs or Organizations: Yes    Attends Banker Meetings: More than 4 times per year    Marital Status: Married  Catering manager Violence: Not At Risk (05/17/2023)   Humiliation, Afraid, Rape, and Kick questionnaire    Fear of Current or Ex-Partner: No    Emotionally Abused: No    Physically Abused: No    Sexually Abused: No    Physical Exam      Future Appointments  Date Time Provider Department Center  11/21/2023  8:30 AM [facility] PA/NP [facility] None

## 2023-11-20 ENCOUNTER — Other Ambulatory Visit (HOSPITAL_COMMUNITY): Payer: Self-pay | Admitting: Emergency Medicine

## 2023-11-20 NOTE — Progress Notes (Signed)
Paramedicine Encounter    Patient ID: Austin [name], male    DOB: 09-11-1956, 67 y.o.   MRN: [id number]   Complaints NONE  Assessment A&O x 4, skin W&D w/ good color.    Compliance with meds NO- missed 3 am/pm doses. "I've been sick"  Pill box filled x 1 month  Refills needed Entreso 49-51  Meds changes since last visit NONE    Social changes NONE   BP 120/78 (BP Location: Left Arm, Patient Position: Sitting, Cuff Size: Normal)   Pulse 94   Wt 144 lb 9.6 oz (65.6 kg)   SpO2 96%   BMI 21.35 kg/m  Weight yesterday- not taken Last visit weight-149lb  ACTION: Home visit completed  Bethanie Dicker [phone] 11/20/23  Patient Care Team: [name], FNP as PCP - General (General Practice) Little Ishikawa, MD as PCP - Cardiology (Cardiology)  Patient Active Problem List   Diagnosis Date Noted   Transient hypotension 06/05/2023   Alcohol use disorder 06/05/2023   Hypertensive 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 (HCC) 06/05/2023   Elevated brain natriuretic peptide (BNP) level 06/05/2023   Elevated LFTs 05/18/2023   Prolonged QT interval 05/18/2023   Hyponatremia 05/17/2023   Iron deficiency anemia 09/03/2020   NICM (nonischemic cardiomyopathy) (HCC)    ETOH abuse 11/10/2018   Essential hypertension 11/10/2018   Blood in stool 11/10/2018    Current Outpatient Medications:    empagliflozin (JARDIANCE) 10 MG TABS tablet, Take 1 tablet (10 mg total) by mouth daily before breakfast., Disp: 90 tablet, Rfl: 3   metoprolol succinate (TOPROL-XL) 25 MG 24 hr tablet, Take 2 tablets (50 mg total) by mouth daily. Take with or immediately following a meal., Disp: 180 tablet, Rfl: 3   sacubitril-valsartan (ENTRESTO) 49-51 MG, Take 1 tablet by mouth 2 (two) times daily., Disp: 60 tablet, Rfl: 6   spironolactone (ALDACTONE) 25 MG tablet, Take 1 tablet (25 mg total) by mouth daily., Disp: 90 tablet, Rfl: 3    Multiple Vitamin (MULTIVITAMIN) tablet, Take 1 tablet by mouth daily. (Patient not taking: Reported on 11/20/2023), Disp: , Rfl:  Allergies  Allergen Reactions   Latex Swelling and Rash     Social History   Socioeconomic History   Marital status: Married    Spouse name: Not on file   Number of children: 2   Years of education: Not on file   Highest education level: Bachelor's degree (e.g., BA, AB, BS)  Occupational History   Not on file  Tobacco Use   Smoking status: Never   Smokeless tobacco: Never  Vaping Use   Vaping status: Never Used  Substance and Sexual Activity   Alcohol use: Yes    Comment: 1-3 beers daily   Drug use: No   Sexual activity: Yes  Other Topics Concern   Not on file  Social History Narrative   Not on file   Social Drivers of Health   Financial Resource Strain: Low Risk  (02/27/2022)   Overall Financial Resource Strain (CARDIA)    Difficulty of Paying Living Expenses: Not hard at all  Food Insecurity: No Food Insecurity (05/21/2023)   Hunger Vital Sign    Worried About Running Out of Food in the Last Year: Never true    Ran Out of Food in the Last Year: Never true  Transportation Needs: No Transportation Needs (06/18/2023)   PRAPARE - Transportation    Lack of Transportation (Medical): No    Lack of  Transportation (Non-Medical): No  Physical Activity: Inactive (02/27/2022)   Exercise Vital Sign    Days of Exercise per Week: 0 days    Minutes of Exercise per Session: 0 min  Stress: No Stress Concern Present (02/27/2022)   [employer] of Occupational Health - Occupational Stress Questionnaire    Feeling of Stress : Only a little  Social Connections: Socially Integrated (12/06/2020)   Social Connection and Isolation Panel [NHANES]    Frequency of Communication with Friends and Family: More than three times a week    Frequency of Social Gatherings with Friends and Family: More than three times a week    Attends Religious Services: More than 4  times per year    Active Member of Clubs or Organizations: Yes    Attends Banker Meetings: More than 4 times per year    Marital Status: Married  Catering manager Violence: Not At Risk (05/17/2023)   Humiliation, Afraid, Rape, and Kick questionnaire    Fear of Current or Ex-Partner: No    Emotionally Abused: No    Physically Abused: No    Sexually Abused: No    Physical Exam      Future Appointments  Date Time Provider Department Center  11/21/2023  8:30 AM [facility] PA/NP [facility] None

## 2023-11-21 ENCOUNTER — Ambulatory Visit (HOSPITAL_COMMUNITY)
Admission: RE | Admit: 2023-11-21 | Discharge: 2023-11-21 | Disposition: A | Discharge status: Home / Self Care | Payer: Medicare Other | Source: Ambulatory Visit | Attending: Family Medicine | Admitting: Family Medicine

## 2023-11-21 ENCOUNTER — Telehealth (HOSPITAL_COMMUNITY): Payer: Self-pay | Admitting: Cardiology

## 2023-11-21 ENCOUNTER — Encounter (HOSPITAL_COMMUNITY): Payer: Self-pay

## 2023-11-21 VITALS — BP 122/64 | HR 91 | Wt 151.1 lb

## 2023-11-21 DIAGNOSIS — I110 Hypertensive heart disease with heart failure: Secondary | ICD-10-CM | POA: Diagnosis not present

## 2023-11-21 DIAGNOSIS — I428 Other cardiomyopathies: Secondary | ICD-10-CM | POA: Insufficient documentation

## 2023-11-21 DIAGNOSIS — E871 Hypo-osmolality and hyponatremia: Secondary | ICD-10-CM | POA: Diagnosis not present

## 2023-11-21 DIAGNOSIS — I447 Left bundle-branch block, unspecified: Secondary | ICD-10-CM | POA: Diagnosis not present

## 2023-11-21 DIAGNOSIS — Z7984 Long term (current) use of oral hypoglycemic drugs: Secondary | ICD-10-CM | POA: Diagnosis not present

## 2023-11-21 DIAGNOSIS — F1010 Alcohol abuse, uncomplicated: Secondary | ICD-10-CM | POA: Diagnosis not present

## 2023-11-21 DIAGNOSIS — I5022 Chronic systolic (congestive) heart failure: Secondary | ICD-10-CM | POA: Diagnosis not present

## 2023-11-21 DIAGNOSIS — I5042 Chronic combined systolic (congestive) and diastolic (congestive) heart failure: Secondary | ICD-10-CM | POA: Diagnosis not present

## 2023-11-21 DIAGNOSIS — Z79899 Other long term (current) drug therapy: Secondary | ICD-10-CM | POA: Diagnosis not present

## 2023-11-21 LAB — LIPID PANEL
Cholesterol: 123 mg/dL (ref 0–200)
HDL: 67 mg/dL (ref >40)
LDL Cholesterol: 42 mg/dL (ref 0–99)
Total CHOL/HDL Ratio: 1.8 {ratio}
Triglycerides: 71 mg/dL (ref <150)
VLDL: 14 mg/dL (ref 0–40)

## 2023-11-21 LAB — BASIC METABOLIC PANEL
Anion gap: 11 (ref 5–15)
BUN: 13 mg/dL (ref 8–23)
CO2: 18 mmol/L — ABNORMAL LOW (ref 22–32)
Calcium: 8.5 mg/dL — ABNORMAL LOW (ref 8.9–10.3)
Chloride: 104 mmol/L (ref 98–111)
Creatinine, Ser: 1.25 mg/dL — ABNORMAL HIGH (ref 0.61–1.24)
GFR, Estimated: >60 mL/min (ref >60)
Glucose, Bld: 95 mg/dL (ref 70–99)
Potassium: 4.5 mmol/L (ref 3.5–5.1)
Sodium: 133 mmol/L — ABNORMAL LOW (ref 135–145)

## 2023-11-21 LAB — BRAIN NATRIURETIC PEPTIDE: B Natriuretic Peptide: 2208.9 pg/mL — ABNORMAL HIGH (ref 0.0–100.0)

## 2023-11-21 MED ORDER — FUROSEMIDE 20 MG PO TABS: 20.0000 mg | ORAL_TABLET | Freq: Every day | ORAL | 3 refills | Status: DC | PRN

## 2023-11-21 NOTE — Patient Instructions (Addendum)
1Thank you for coming in today  If you had labs drawn today, any labs that are abnormal the clinic will call you No news is good news  You have been referred to EP Clinic, their office will call you for further appointment details    Medications: No changes  Follow up appointments:  Your physician recommends that you schedule a follow-up appointment in:  1 month in clinic   Do the following things EVERYDAY: Weigh yourself in the morning before breakfast. Write it down and keep it in a log. Take your medicines as prescribed Eat low salt foods--Limit salt (sodium) to 2000 mg per day.  Stay as active as you can everyday Limit all fluids for the day to less than 2 liters   At the [facility], you and your health needs are our priority. As part of our continuing mission to provide you with exceptional heart care, we have created designated Provider Care Teams. These Care Teams include your primary Cardiologist (physician) and Advanced Practice Providers (APPs- Physician Assistants and Nurse Practitioners) who all work together to provide you with the care you need, when you need it.   You may see any of the following providers on your designated Care Team at your next follow up: Dr Arvilla Meres Dr Marca Ancona Dr. Marcos Eke, NP Robbie Lis, [place] [facility] [place], [place] Brynda Peon, NP Karle Plumber, PharmD   Please be sure to bring in all your medications bottles to every appointment.    Thank you for choosing [facility]  If you have any questions or concerns before your next appointment please send [place] a message through Hope or call our office at [phone].    TO LEAVE A MESSAGE FOR THE NURSE SELECT OPTION 2, PLEASE LEAVE A MESSAGE INCLUDING: YOUR NAME DATE OF BIRTH CALL BACK NUMBER REASON FOR CALL**this is important as we prioritize the call backs  YOU WILL RECEIVE A CALL  BACK THE SAME DAY AS LONG AS YOU CALL BEFORE 4:00 PM

## 2023-11-21 NOTE — Progress Notes (Signed)
ADVANCED HF CLINIC NOTE  PCP: [name], FNP Cardiology: Dr. Bjorn Pippin HF Cardiology: Dr. Shirlee Latch  67 y.o. with history of ETOH abuse and prior HTN, and nonischemic cardiomyopathy.   In 2/21, he developed exertional dyspnea and was noted at an urgent care center to have CHF.  Echo 3/21 EF < 20% with severe RV dysfunction.  RHC/LHC in 3/21 showed no CAD but R > L filling pressure elevation and preserved cardiac index.  He was admitted 4/21 with acute/chronic systolic CHF.  He was diuresed and cardiac meds were adjusted.  Cardiac MRI  4/21 EF 14%, moderate LV dilation, severely decreased RV function, and moderate pericardial effusion. There was no evidence for myocarditis noted.   Patient, of note, has a history of heavy ETOH intake for decades. Per his wife, he would drink heavily every day after getting home from work and especially heavily on the weekend. He has a history of ETOH withdrawal.  He does have some family history of CHF (sister, maybe his mother), but he does not have a lot of detail about these diagnoses. No drugs (cocaine, amphetamines).   Echo 8/21 EF 30-35%, diffuse hypokinesis, normal RV, no pericardial effusion. Echo in 10/21 showed EF 45-50% with global hypokinesis.  Echo in 3/22 with EF 45-50%, normal RV, PASP 34 mmHg. TEE in 5/23 showed EF 50-55%, normal RV, trivial MR, mobile RA structure seen on TTE was [facility].   Admitted 6/24 with weakness, 2/2 N/V/D x several days prior. Ethanol level 38. Na 118, nephrology consulted and he received IVF, GDMT held. He continued to drink ETOH while admitted, sustained a fall during admission and hit his head, CT head negative. He was discharged home on prior GDMT except spironolactone, weight 150 lbs.  Sent to the ED 05/26/23 after AHF follow-up.  At appt patient's BP was 60s/50s.  In the ED SBP in 70s.  Responded well to 1 L IVF, refused to get admitted, sent home with improvement in BP.  Suspect volume depleted with high alcohol  intake, ethanol level 140.    Echo (8/24): EF 25-30%, GIDD, RV normal. Suspected drop in EF 2/2 med noncompliance and ETOH.  Echo (12/24): 25-30%  Today he returns for AHF follow up with his wife and paramedic.  Overall feeling fine.  Reports he is still drinking but has cut back.  Has had a viral PNA requiring anitbiotics in the past 2 wks doing better now, residual cough, but better. Appetite improving. He works part-time at [employer].  Weight 145-150 lbs at home.  He has been taking all his meds, but missed 3 days recently.  He reports no exertional dyspnea or chest pain.  No orthopnea/PND.  No lightheadedness.    ECG: NSR 93 bpm, LBBB 184 msec (personally reviewed)  Labs (4/21): HIV negative, K 4.2, creatinine 0.93 Labs (5/21): K 4.2, creatinine 1.23, digoxin 0.6 Labs (7/21): K 3.8, creatinine 1.49 Labs (10/21): K 4.2, creatinine 1.25, BNP 14 Labs (12/21): K 4.3, creatinine 1.14 Labs (3/22): K 4.4, creatinine 1.34 Labs (7/22): K 3.9, creatinine 1.19 Labs (3/23): K 4.6, creatinine 1.29 Labs (1/24): LDL 48, K 4.2, creatinine 2.95 Labs (6/24): K 3.2, creatinine 1.11 Labs (6/24): K3.7, SCR 1.21, BNP 1200, ethanol 140 Labs (7/24): K 4.4, creatinine 1.15  Labs (8/24): K 4, SCr 0.91 Labs (10/24): Na 126, K 4.4, creatinine 0.81  PMH: 1. Prostate cancer 2. HTN: BP now low.  3. Fe deficiency anemia 4. ETOH abuse 5. GERD 6. Chronic systolic CHF: Nonischemic  cardiomyopathy.  - Echo 3/21 with EF < 20%, severe global HK, severely dilated and severely dysfunctional RV, severe biatrial enlargement.  - RHC/LHC (3/21): No CAD; mean RA 15, PA 51/24, mean PCWP 19, CI 3.96.  - HIV negative 4/21 - Cardiac MRI (4/21): LV EF 14%, moderate LV dilation, mild RV dilation with severely decreased systolic function, moderate pericardial effusion, no LGE noted but images difficult due to recent Fe infusion.  - Echo (8/21): EF 30-35%, diffuse hypokinesis, normal RV, no pericardial fluid.  - Echo (10/21): EF  45-50%, diffuse hypokinesis, normal RV.  - Echo (3/22): EF 45-50%, normal RV, PASP 34 mmHg.  - TEE (5/23): EF 50-55%, normal RV, trivial MR, mobile RA structure seen on TTE was [facility].  - Echo (8/24): EF 25-30%, GIDD, RV normal  - Echo (12/24): 25-30%, mod reduced RV, mild MR, mid TR.  FH: Sister with CHF, brother with pacemaker, mother with CHF.   SH: Married, lives in [place], heavy ETOH in the past has now cut back, no drugs.  He was a Estate agent.   ROS: All systems reviewed and negative except as per HPI.   Current Outpatient Medications  Medication Sig Dispense Refill   empagliflozin (JARDIANCE) 10 MG TABS tablet Take 1 tablet (10 mg total) by mouth daily before breakfast. 90 tablet 3   metoprolol succinate (TOPROL-XL) 25 MG 24 hr tablet Take 2 tablets (50 mg total) by mouth daily. Take with or immediately following a meal. 180 tablet 3   Multiple Vitamin (MULTIVITAMIN) tablet Take 1 tablet by mouth daily.     sacubitril-valsartan (ENTRESTO) 49-51 MG Take 1 tablet by mouth 2 (two) times daily. 60 tablet 6   spironolactone (ALDACTONE) 25 MG tablet Take 1 tablet (25 mg total) by mouth daily. 90 tablet 3   No current facility-administered medications for this encounter.   Wt Readings from Last 3 Encounters:  11/21/23 68.5 kg (151 lb 2 oz)  11/20/23 65.6 kg (144 lb 9.6 oz)  11/13/23 65.5 kg (144 lb 6.4 oz)   BP 122/64   Pulse 91   Wt 68.5 kg (151 lb 2 oz)   SpO2 99%   BMI 22.32 kg/m   Physical Exam General: Well appearing. No distress on RA HEENT: neck supple.  Cardiac: JVP not visible. S1 and S2 present. No murmurs or rub. Resp: Lung sounds clear and equal B/L Abdomen: Soft, non-tender, non-distended. + BS. Extremities: Warm and dry. No rash, cyanosis.  No edema.  Neuro: Alert and oriented x3. Affect pleasant. Moves all extremities without difficulty.  Assessment/Plan: 1. Chronic systolic CHF: Nonischemic cardiomyopathy, possibly due to ETOH.  Echo 3/21  showed EF 20% with moderately decreased RV systolic function by my read.  RHC/LHC from 3/21 showed no significant coronary disease, preserved cardiac output, R>L heart failure.  Cardiac MRI in 4/21 showed EF 14%, severe RV dysfunction, no LGE.  HIV negative.  No LGE noted on cardiac MRI but difficult LGE images, cannot rule out prior myocarditis.  Family history is not fully defined.  He has not been noted to have frequent PVCs.  Echo in 10/21 showed EF improved to 45-50%, echo in 4/22 showed stable EF 45-50%.  TEE in 5/23 showed EF 50-55%, normal RV, trivial MR, mobile RA structure seen on TTE was [facility]. Echo in 8/24 with drop in EF to 25-30% in setting of heavy ETOH intake and noncompliance with meds. Echo 10/24 EF 25-30% on GDMT. LBBB with on today ECG. He has cut  back some on ETOH but is still drinking. Not volume overloaded on exam, NYHA class I-II.  - Continue Entresto 49-51 mg bid.  BNP/BMET today - Continue Toprol XL to 50 mg daily.  - Continue Jardiance 10 mg daily.  - Continue spironolactone 25 mg daily  - Encouraged to continue to cut back on ETOH use  2. LBBB: on today ECG.  - Repeat Echo 25-30% after compliance with medications and cutting back on ETOH. Now candidate for CRT-D. Long discussion with patient and wife about need for CRT-D.  - Refer to EP for CRT-D implantation with wide LBBB 3. ETOH abuse: Long history of heavy ETOH.  This may be the cause of his cardiomyopathy.  He is still drinking but has cut back.  - Strongly encouraged him to cut back further again today. 4. Hyponatremia: In setting of beer intake.  - He needs to cut back on po fluid intake. BMET today  Follow up in 1 month with APP for optimization prior to CRT-D.   [place] Shadee Rathod, NP 11/21/2023

## 2023-11-21 NOTE — Telephone Encounter (Signed)
[facility] with paramedicine called to review AVS-med change during visit  Pt and paramed understood lasix x 3 days per Jordan,NP however no meds sent to pharmacy     Please review

## 2023-11-21 NOTE — Telephone Encounter (Signed)
Pt aware via wife  Message to para medicine as Lorain Childes

## 2023-11-27 ENCOUNTER — Other Ambulatory Visit (HOSPITAL_COMMUNITY): Payer: Self-pay | Admitting: Emergency Medicine

## 2023-11-27 NOTE — Progress Notes (Unsigned)
Paramedicine Encounter    Patient ID: Austin [name], male    DOB: 05-Oct-1956, 67 y.o.   MRN: [id number]   Complaints NONE  Assessment A&O x 4, skin W&D w/ good color.  Lung sounds clear throughout.  No peripheral edema noted.    Compliance with meds Missing multiple does.    Pill box filled x 2 weeks  Refills needed NONE  Meds changes since last visit 3 days of 20mg  Furosemide    Social changes NONE   BP (!) 88/60 (BP Location: Left Arm, Patient Position: Sitting, Cuff Size: Normal)   Pulse 96  Weight yesterday- not taken Last visit weight-144lb  ATF Mr. Bohren A&O x 4, skin W&D w/ good color.  Pt. Denies chest pain or SOB.  His BP is on the low side but he is not symptomatic of same.  He denies weakness, or dizziness.  He was prescribed Furosemide 1 20mg  tablet x 3 days.  The prescription for his Furosemide was for a quantity of 90 and I counted to make sure he had only taken 3 Furosemide as prescribed.  I advised him not to take any more of this medication unless indicated by the provider from the clinic.  ACTION: {Paramed [phone]}  Beatrix Shipper, EMT-Paramedic [phone] 11/27/23  Patient Care Team: [name], FNP as PCP - General (General Practice) Little Ishikawa, MD as PCP - Cardiology (Cardiology)  Patient Active Problem List   Diagnosis Date Noted   Transient hypotension 06/05/2023   Alcohol use disorder 06/05/2023   Hypertensive 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 (HCC) 06/05/2023   Elevated brain natriuretic peptide (BNP) level 06/05/2023   Elevated LFTs 05/18/2023   Prolonged QT interval 05/18/2023   Hyponatremia 05/17/2023   Iron deficiency anemia 09/03/2020   NICM (nonischemic cardiomyopathy) (HCC)    ETOH abuse 11/10/2018   Essential hypertension 11/10/2018   Blood in stool 11/10/2018    Current Outpatient Medications:    empagliflozin (JARDIANCE) 10 MG TABS tablet, Take 1  tablet (10 mg total) by mouth daily before breakfast., Disp: 90 tablet, Rfl: 3   furosemide (LASIX) 20 MG tablet, Take 1 tablet (20 mg total) by mouth daily as needed for fluid., Disp: 30 tablet, Rfl: 3   metoprolol succinate (TOPROL-XL) 25 MG 24 hr tablet, Take 2 tablets (50 mg total) by mouth daily. Take with or immediately following a meal., Disp: 180 tablet, Rfl: 3   Multiple Vitamin (MULTIVITAMIN) tablet, Take 1 tablet by mouth daily., Disp: , Rfl:    sacubitril-valsartan (ENTRESTO) 49-51 MG, Take 1 tablet by mouth 2 (two) times daily., Disp: 60 tablet, Rfl: 6   spironolactone (ALDACTONE) 25 MG tablet, Take 1 tablet (25 mg total) by mouth daily., Disp: 90 tablet, Rfl: 3 Allergies  Allergen Reactions   Latex Swelling and Rash     Social History   Socioeconomic History   Marital status: Married    Spouse name: Not on file   Number of children: 2   Years of education: Not on file   Highest education level: Bachelor's degree (e.g., BA, AB, BS)  Occupational History   Not on file  Tobacco Use   Smoking status: Never   Smokeless tobacco: Never  Vaping Use   Vaping status: Never Used  Substance and Sexual Activity   Alcohol use: Yes    Comment: 1-3 beers daily   Drug use: No   Sexual activity: Yes  Other Topics Concern   Not on file  Social History Narrative   Not on file   Social Drivers of Health   Financial Resource Strain: Low Risk  (02/27/2022)   Overall Financial Resource Strain (CARDIA)    Difficulty of Paying Living Expenses: Not hard at all  Food Insecurity: No Food Insecurity (05/21/2023)   Hunger Vital Sign    Worried About Running Out of Food in the Last Year: Never true    Ran Out of Food in the Last Year: Never true  Transportation Needs: No Transportation Needs (06/18/2023)   PRAPARE - Administrator, Civil Service (Medical): No    Lack of Transportation (Non-Medical): No  Physical Activity: Inactive (02/27/2022)   Exercise Vital Sign    Days of  Exercise per Week: 0 days    Minutes of Exercise per Session: 0 min  Stress: No Stress Concern Present (02/27/2022)   [employer] of Occupational Health - Occupational Stress Questionnaire    Feeling of Stress : Only a little  Social Connections: Socially Integrated (12/06/2020)   Social Connection and Isolation Panel [NHANES]    Frequency of Communication with Friends and Family: More than three times a week    Frequency of Social Gatherings with Friends and Family: More than three times a week    Attends Religious Services: More than 4 times per year    Active Member of Clubs or Organizations: Yes    Attends Banker Meetings: More than 4 times per year    Marital Status: Married  Catering manager Violence: Not At Risk (05/17/2023)   Humiliation, Afraid, Rape, and Kick questionnaire    Fear of Current or Ex-Partner: No    Emotionally Abused: No    Physically Abused: No    Sexually Abused: No    Physical Exam      Future Appointments  Date Time Provider Department Center  12/22/2023 11:00 AM Duke Salvia, MD [facility]  12/23/2023  8:30 AM [facility] PA/NP [facility] None

## 2023-12-17 ENCOUNTER — Telehealth (HOSPITAL_COMMUNITY): Payer: Self-pay | Admitting: Emergency Medicine

## 2023-12-17 NOTE — Telephone Encounter (Signed)
 Spoke w/ Austin [name] and scheduled home visit 1/10 @ 9:00    Beatrix Shipper, EMT-Paramedic [phone] 12/17/2023

## 2023-12-17 NOTE — Telephone Encounter (Signed)
 Called Mrs. Austin [name] w/ no answer and unable to LVM, also called mobile number w/ no answer. Will call again later.  Need to have home visit.    Beatrix Shipper, EMT-Paramedic [phone] 12/17/2023

## 2023-12-19 ENCOUNTER — Other Ambulatory Visit (HOSPITAL_COMMUNITY): Payer: Self-pay | Admitting: Emergency Medicine

## 2023-12-19 NOTE — Progress Notes (Signed)
 Paramedicine Encounter    Patient ID: Austin [name], male    DOB: 28-May-1956, 68 y.o.   MRN: [id number]   Complaints NONE  Assessment A&O x 4, skin W&D w/ good color.  Odor of ETOH on his breath.  Denies chest pain or SOB.  Lung sounds clear and equal throughout and no edema noted to his extremities.  Compliance with meds difficult to assess due to him not having his pill box.  Pill box filled x 1 week  Refills needed NONE  Meds changes since last visit NONE    Social changes NONE  Today's visit finds Austin [name] reporting to be feeling good.  He denies chest pain or SOB. Lung sounds clear and no edema noted.  Odor of ETOH present on pt's breath.  He denies drinking today but there was a 1/2 empty 40oz beer on the coffee table in the living room.  His wife acknowledges to me that he has been drinking. Pt states he went to visit family for the holidays and left his pill box in the hotel.  It is in the process of being mailed back to him.  Today provided him an extra box for this weeks med reconciliation and same was filled x 1 week.  Pt has a pending appointment @ the [facility] 12/23/23 @ 8:30.  I advised him I would attend this appointment with him and to bring his meds for the visit.  BP (!) 120/90 (BP Location: Left Arm, Patient Position: Standing)   Pulse (!) 102   Wt 149 lb (67.6 kg)   SpO2 96%   BMI 22.00 kg/m  Weight yesterday-not taken Last visit weight-146lb  ACTION: Home visit completed  Mary Claudene Kennel [phone] 12/19/23  Patient Care Team: Georgina Speaks, FNP as PCP - General (General Practice) Kate Lonni CROME, MD as PCP - Cardiology (Cardiology)  Patient Active Problem List   Diagnosis Date Noted   Transient hypotension 06/05/2023   Alcohol  use disorder 06/05/2023   Hypertensive 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 (HCC) 06/05/2023   Elevated brain natriuretic peptide (BNP) level 06/05/2023    Elevated LFTs 05/18/2023   Prolonged QT interval 05/18/2023   Hyponatremia 05/17/2023   Iron deficiency anemia 09/03/2020   NICM (nonischemic cardiomyopathy) (HCC)    ETOH abuse 11/10/2018   Essential hypertension 11/10/2018   Blood in stool 11/10/2018    Current Outpatient Medications:    empagliflozin  (JARDIANCE ) 10 MG TABS tablet, Take 1 tablet (10 mg total) by mouth daily before breakfast., Disp: 90 tablet, Rfl: 3   metoprolol  succinate (TOPROL -XL) 25 MG 24 hr tablet, Take 2 tablets (50 mg total) by mouth daily. Take with or immediately following a meal., Disp: 180 tablet, Rfl: 3   Multiple Vitamin (MULTIVITAMIN) tablet, Take 1 tablet by mouth daily., Disp: , Rfl:    sacubitril -valsartan  (ENTRESTO ) 49-51 MG, Take 1 tablet by mouth 2 (two) times daily., Disp: 60 tablet, Rfl: 6   spironolactone  (ALDACTONE ) 25 MG tablet, Take 1 tablet (25 mg total) by mouth daily., Disp: 90 tablet, Rfl: 3   furosemide  (LASIX ) 20 MG tablet, Take 1 tablet (20 mg total) by mouth daily as needed for fluid. (Patient not taking: Reported on 12/19/2023), Disp: 30 tablet, Rfl: 3 Allergies  Allergen Reactions   Latex Swelling and Rash     Social History   Socioeconomic History   Marital status: Married    Spouse name: Not on file   Number of children: 2  Years of education: Not on file   Highest education level: Bachelor's degree (e.g., BA, AB, BS)  Occupational History   Not on file  Tobacco Use   Smoking status: Never   Smokeless tobacco: Never  Vaping Use   Vaping status: Never Used  Substance and Sexual Activity   Alcohol  use: Yes    Comment: 1-3 beers daily   Drug use: No   Sexual activity: Yes  Other Topics Concern   Not on file  Social History Narrative   Not on file   Social Drivers of Health   Financial Resource Strain: Low Risk  (02/27/2022)   Overall Financial Resource Strain (CARDIA)    Difficulty of Paying Living Expenses: Not hard at all  Food Insecurity: No Food  Insecurity (05/21/2023)   Hunger Vital Sign    Worried About Running Out of Food in the Last Year: Never true    Ran Out of Food in the Last Year: Never true  Transportation Needs: No Transportation Needs (06/18/2023)   PRAPARE - Administrator, Civil Service (Medical): No    Lack of Transportation (Non-Medical): No  Physical Activity: Inactive (02/27/2022)   Exercise Vital Sign    Days of Exercise per Week: 0 days    Minutes of Exercise per Session: 0 min  Stress: No Stress Concern Present (02/27/2022)   [employer] of Occupational Health - Occupational Stress Questionnaire    Feeling of Stress : Only a little  Social Connections: Socially Integrated (12/06/2020)   Social Connection and Isolation Panel [NHANES]    Frequency of Communication with Friends and Family: More than three times a week    Frequency of Social Gatherings with Friends and Family: More than three times a week    Attends Religious Services: More than 4 times per year    Active Member of Clubs or Organizations: Yes    Attends Banker Meetings: More than 4 times per year    Marital Status: Married  Catering Manager Violence: Not At Risk (05/17/2023)   Humiliation, Afraid, Rape, and Kick questionnaire    Fear of Current or Ex-Partner: No    Emotionally Abused: No    Physically Abused: No    Sexually Abused: No    Physical Exam      Future Appointments  Date Time Provider Department Center  12/22/2023 11:00 AM Fernande Elspeth BROCKS, MD [facility]  12/23/2023  8:30 AM [facility] PA/NP [facility] None

## 2023-12-22 ENCOUNTER — Encounter: Payer: Self-pay | Admitting: Internal Medicine

## 2023-12-22 ENCOUNTER — Ambulatory Visit: Payer: Medicare Other | Attending: Internal Medicine | Admitting: Internal Medicine

## 2023-12-22 VITALS — BP 100/62 | HR 90 | Ht 69.0 in | Wt 149.0 lb

## 2023-12-22 DIAGNOSIS — I5022 Chronic systolic (congestive) heart failure: Secondary | ICD-10-CM | POA: Diagnosis not present

## 2023-12-22 DIAGNOSIS — I447 Left bundle-branch block, unspecified: Secondary | ICD-10-CM | POA: Diagnosis not present

## 2023-12-22 NOTE — Progress Notes (Signed)
 ELECTROPHYSIOLOGY CONSULT NOTE  Patient ID: Austin [name], MRN: [id number], DOB/AGE: 1955/12/23 68 y.o. Admit date: (Not on file) Date of Consult: 12/22/2023  Primary Physician: Georgina Speaks, FNP Primary Cardiologist: DM     Austin [name] is a 68 y.o. male who is being seen today for the evaluation of ICD at the request of DM.    HPI Austin [name] is a 68 y.o. 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  Noted at that time to have a strong history of alcohol  intake. Alcohol  levels were measured at 140 6/24 having presented with hypotension brought in by EMS.  His wife has noted increasing fatigue over the last 6 months.  Some dyspnea.  He is able to do groceries and climb stairs.  No chest pain.  Sleeps on 2 pillows mostly on the sofa.  No edema.  No nocturnal dyspnea   No palpitations or syncope.  DATE TEST EF    2/21 Echo  <20-25%   2/21 LHC  No CAD but R>L filling pressures   4/21 cMRI  14 %   8/21 Echo  30-35%   8/24 Echo  25-30%         Date Cr K Hgb  6/24    13.0  7/24 1.13 4.7 10.6  12/24 1.25 4.5           Date HR QRSd  6/24 93 164 LBBB  6/24 73 139 IVCD  7/24 110 150  7//24 66 134  10/24 94 154  12/24 93 150  1/25 89 150      Past Medical History:  Diagnosis Date   Arthritis    hands   CHF (congestive heart failure) (HCC)    GERD (gastroesophageal reflux disease)    Hypertension    Hypomagnesemia    Hyponatremia    Prostate cancer [facility])       Surgical History:  Past Surgical History:  Procedure Laterality Date   LYMPHADENECTOMY Bilateral 01/26/2014   Procedure: LYMPHADENECTOMY WITH INDOCYANINE GREEN  DYE;  Surgeon: Ricardo Likens, MD;  Location: [facility] ORS;  Service: Urology;  Laterality: Bilateral;   RIGHT/LEFT HEART CATH AND CORONARY ANGIOGRAPHY N/A 02/25/2020   Procedure: RIGHT/LEFT HEART CATH  AND CORONARY ANGIOGRAPHY;  Surgeon: Verlin Lonni BIRCH, MD;  Location: [facility] INVASIVE CV LAB;  Service: Cardiovascular;  Laterality: N/A;   ROBOT ASSISTED LAPAROSCOPIC RADICAL PROSTATECTOMY N/A 01/26/2014   Procedure: ROBOTIC ASSISTED LAPAROSCOPIC RADICAL PROSTATECTOMY;  Surgeon: Ricardo Likens, MD;  Location: [facility] ORS;  Service: Urology;  Laterality: N/A;   SHOULDER SURGERY Left    TEE WITHOUT CARDIOVERSION N/A 04/15/2022   Procedure: TRANSESOPHAGEAL ECHOCARDIOGRAM (TEE);  Surgeon: Rolan Ezra RAMAN, MD;  Location: [facility] ENDOSCOPY;  Service: Cardiovascular;  Laterality: N/A;     Home Meds: Current Meds  Medication Sig   empagliflozin  (JARDIANCE ) 10 MG TABS tablet Take 1 tablet (10 mg total) by mouth daily before breakfast.   furosemide  (LASIX ) 20 MG tablet Take 1 tablet (20 mg total) by mouth daily as needed for fluid.   metoprolol  succinate (TOPROL -XL) 25 MG 24 hr tablet Take 2 tablets (50 mg total) by mouth daily. Take with or immediately following a meal.   Multiple Vitamin (MULTIVITAMIN) tablet Take 1 tablet by mouth daily.   sacubitril -valsartan  (ENTRESTO ) 49-51 MG Take 1 tablet by mouth 2 (two) times daily.   spironolactone  (ALDACTONE ) 25 MG tablet Take 1  tablet (25 mg total) by mouth daily.    Allergies:  Allergies  Allergen Reactions   Latex Swelling and Rash    Social History   Socioeconomic History   Marital status: Married    Spouse name: Not on file   Number of children: 2   Years of education: Not on file   Highest education level: Bachelor's degree (e.g., BA, AB, BS)  Occupational History   Not on file  Tobacco Use   Smoking status: Never   Smokeless tobacco: Never  Vaping Use   Vaping status: Never Used  Substance and Sexual Activity   Alcohol  use: Yes    Comment: 1-3 beers daily   Drug use: No   Sexual activity: Yes  Other Topics Concern   Not on file  Social History Narrative   Not on file   Social Drivers of Health   Financial Resource Strain: Low Risk   (02/27/2022)   Overall Financial Resource Strain (CARDIA)    Difficulty of Paying Living Expenses: Not hard at all  Food Insecurity: No Food Insecurity (05/21/2023)   Hunger Vital Sign    Worried About Running Out of Food in the Last Year: Never true    Ran Out of Food in the Last Year: Never true  Transportation Needs: No Transportation Needs (06/18/2023)   PRAPARE - Administrator, Civil Service (Medical): No    Lack of Transportation (Non-Medical): No  Physical Activity: Inactive (02/27/2022)   Exercise Vital Sign    Days of Exercise per Week: 0 days    Minutes of Exercise per Session: 0 min  Stress: No Stress Concern Present (02/27/2022)   [employer] of Occupational Health - Occupational Stress Questionnaire    Feeling of Stress : Only a little  Social Connections: Socially Integrated (12/06/2020)   Social Connection and Isolation Panel [NHANES]    Frequency of Communication with Friends and Family: More than three times a week    Frequency of Social Gatherings with Friends and Family: More than three times a week    Attends Religious Services: More than 4 times per year    Active Member of [employer] or Organizations: Yes    Attends Engineer, Structural: More than 4 times per year    Marital Status: Married  Catering Manager Violence: Not At Risk (05/17/2023)   Humiliation, Afraid, Rape, and Kick questionnaire    Fear of Current or Ex-Partner: No    Emotionally Abused: No    Physically Abused: No    Sexually Abused: No     Family History  Problem Relation Age of Onset   Hypertension Mother    Hypertension Father      ROS:  Please see the history of present illness.     All other systems reviewed and negative.    Physical Exam: Blood pressure 100/62, pulse 90, height 5' 9 (1.753 m), weight 149 lb (67.6 kg), SpO2 96%. General: Well developed, well nourished male in no acute distress. Head: Normocephalic, atraumatic, sclera non-icteric, no xanthomas,  nares are without discharge. EENT: normal  Lymph Nodes:  none Neck: Negative for carotid bruits. JVD 8-10 Back:without scoliosis kyphosis Lungs: Clear bilaterally to auscultation without wheezes, rales, or rhonchi. Breathing is unlabored. Heart: RRR with S1 S2. No   murmur . No rubs, or gallops appreciated. Abdomen: Soft, non-tender, non-distended with normoactive bowel sounds. No hepatomegaly. No rebound/guarding. No obvious abdominal masses. Msk:  Strength and tone appear normal for age. Extremities: No clubbing or  cyanosis. No  edema.  Distal pedal pulses are 2+ and equal bilaterally. Skin: Warm and Dry Neuro: Alert and oriented X 3. CN III-XII intact Grossly normal sensory and motor function . Psych:  Responds to questions appropriately with a normal affect.        EKG: Sinus at 89 Intervals 19/16/45 Left bundle branch block with fractionation   Assessment and Plan:  Nonischemic cardiomyopathy probably alcohol  related  Left bundle branch block rate related  Congestive heart failure-class IIb-IIIa  with recent worsening  Alcohol  intake-exuberant about 80 ounces per day    The patient has a nonischemic cardiomyopathy likely alcohol  related with ongoing significant alcohol  intake.  At 68 years of age, he begins to encroach upon the [place] data showing the lack of benefit of ICDs.  The care HF data would suggest that resynchronization would be certainly age independent and perhaps you are more appropriate.  Interestingly, his left bundle branch block seems to be largely rate related as noted above.  So a strategy might include augmenting sinus node slowing and with beta-blockers or possibly ivabradine so as to avoid the dyssynchrony of left bundle branch block.  View of these with Dr. SHARYL       Elspeth Sage

## 2023-12-22 NOTE — Patient Instructions (Signed)
 Medication Instructions:  Your physician recommends that you continue on your current medications as directed. Please refer to the Current Medication list given to you today.  *If you need a refill on your cardiac medications before your next appointment, please call your pharmacy*   Lab Work: None ordered   Testing/Procedures: None ordered   Follow-Up: At [facility], you and your health needs are our priority.  As part of our continuing mission to provide you with exceptional heart care, we have created designated Provider Care Teams.  These Care Teams include your primary Cardiologist (physician) and Advanced Practice Providers (APPs -  Physician Assistants and Nurse Practitioners) who all work together to provide you with the care you need, when you need it.  We recommend signing up for the patient portal called MyChart.  Sign up information is provided on this After Visit Summary.  MyChart is used to connect with patients for Virtual Visits (Telemedicine).  Patients are able to view lab/test results, encounter notes, upcoming appointments, etc.  Non-urgent messages can be sent to your provider as well.   To learn more about what you can do with MyChart, go to [email or web address].    Your next appointment:   as  needed   The format for your next appointment:   In Person  Provider:   Elspeth Sage, MD    Thank you for choosing [facility]!!   [phone]

## 2023-12-22 NOTE — Progress Notes (Signed)
 ADVANCED HF CLINIC NOTE  PCP: Georgina Speaks, FNP Cardiology: Dr. Kate HF Cardiology: Dr. Rolan  Chief Complaint: Heart Failure Follow-up  HPI: 68 y.o. with history of ETOH abuse and prior HTN, and nonischemic cardiomyopathy.   In 2/21, he developed exertional dyspnea and was noted at an urgent care center to have CHF.  Echo 3/21 EF < 20% with severe RV dysfunction.  RHC/LHC in 3/21 showed no CAD but R > L filling pressure elevation and preserved cardiac index.  He was admitted 4/21 with acute/chronic systolic CHF.  He was diuresed and cardiac meds were adjusted.  Cardiac MRI  4/21 EF 14%, moderate LV dilation, severely decreased RV function, and moderate pericardial effusion. There was no evidence for myocarditis noted.   Patient, of note, has a history of heavy ETOH intake for decades. Per his wife, he would drink heavily every day after getting home from work and especially heavily on the weekend. He has a history of ETOH withdrawal.  He does have some family history of CHF (sister, maybe his mother), but he does not have a lot of detail about these diagnoses. No drugs (cocaine, amphetamines).   Echo 8/21 EF 30-35%, diffuse hypokinesis, normal RV, no pericardial effusion. Echo in 10/21 showed EF 45-50% with global hypokinesis.  Echo in 3/22 with EF 45-50%, normal RV, PASP 34 mmHg. TEE in 5/23 showed EF 50-55%, normal RV, trivial MR, mobile RA structure seen on TTE was [facility].   Admitted 6/24 with weakness, 2/2 N/V/D x several days prior. Ethanol level 38. Na 118, nephrology consulted and he received IVF, GDMT held. He continued to drink ETOH while admitted, sustained a fall during admission and hit his head, CT head negative. He was discharged home on prior GDMT except spironolactone , weight 150 lbs.  Sent to the ED 05/26/23 after AHF follow-up.  At appt patient's BP was 60s/50s.  In the ED SBP in 70s.  Responded well to 1 L IVF, refused to get admitted, sent home with improvement  in BP.  Suspect volume depleted with high alcohol  intake, ethanol level 140.    Echo (8/24): EF 25-30%, GIDD, RV normal. Suspected drop in EF 2/2 med noncompliance and ETOH. Echo (12/24): 25-30%  Seen by EP yesterday for CRT-D evaluation. Thought that there might not be benefit of CRT-D by EP. Did appear that LBBB is rate dependent, maybe better benefit from reduced rate.   Today he returns for HF follow up with wife and Claudene, Risk Manager. Overall feeling fine. Denies increasing SOB, CP, dizziness, edema, or PND/Orthopnea. Wife states that he is more fatigued and tired than usual. Taking him longer to perform tasks. Appetite ok. Stating that he has cut back significantly with drinking, however endorses that he is drinking 1 ~40oz beer while football games are on. No fever or chills. Taking all medications.   ReDS: 30%  Labs (4/21): HIV negative, K 4.2, creatinine 0.93 Labs (5/21): K 4.2, creatinine 1.23, digoxin  0.6 Labs (7/21): K 3.8, creatinine 1.49 Labs (10/21): K 4.2, creatinine 1.25, BNP 14 Labs (12/21): K 4.3, creatinine 1.14 Labs (3/22): K 4.4, creatinine 1.34 Labs (7/22): K 3.9, creatinine 1.19 Labs (3/23): K 4.6, creatinine 1.29 Labs (1/24): LDL 48, K 4.2, creatinine 8.74 Labs (6/24): K 3.2, creatinine 1.11 Labs (6/24): K3.7, SCR 1.21, BNP 1200, ethanol 140 Labs (7/24): K 4.4, creatinine 1.15  Labs (8/24): K 4, SCr 0.91 Labs (10/24): Na 126, K 4.4, creatinine 0.81 Labs (12/24): K 4.5, creatinine 1.25, LDL 42  PMH:  1. Prostate cancer 2. HTN: BP now low.  3. Fe deficiency anemia 4. ETOH abuse 5. GERD 6. Chronic systolic CHF: Nonischemic cardiomyopathy.  - Echo 3/21 with EF < 20%, severe global HK, severely dilated and severely dysfunctional RV, severe biatrial enlargement.  - RHC/LHC (3/21): No CAD; mean RA 15, PA 51/24, mean PCWP 19, CI 3.96.  - HIV negative 4/21 - Cardiac MRI (4/21): LV EF 14%, moderate LV dilation, mild RV dilation with severely decreased systolic  function, moderate pericardial effusion, no LGE noted but images difficult due to recent Fe infusion.  - Echo (8/21): EF 30-35%, diffuse hypokinesis, normal RV, no pericardial fluid.  - Echo (10/21): EF 45-50%, diffuse hypokinesis, normal RV.  - Echo (3/22): EF 45-50%, normal RV, PASP 34 mmHg.  - TEE (5/23): EF 50-55%, normal RV, trivial MR, mobile RA structure seen on TTE was [facility].  - Echo (8/24): EF 25-30%, GIDD, RV normal  - Echo (12/24): 25-30%, mod reduced RV, mild MR, mid TR.  FH: Sister with CHF, brother with pacemaker, mother with CHF.   SH: Married, lives in [place], heavy ETOH in the past has now cut back, no drugs.  He was a estate agent.   ROS: All systems reviewed and negative except as per HPI.   Current Outpatient Medications  Medication Sig Dispense Refill   empagliflozin  (JARDIANCE ) 10 MG TABS tablet Take 1 tablet (10 mg total) by mouth daily before breakfast. 90 tablet 3   furosemide  (LASIX ) 20 MG tablet Take 1 tablet (20 mg total) by mouth daily as needed for fluid. 30 tablet 3   Multiple Vitamin (MULTIVITAMIN) tablet Take 1 tablet by mouth daily.     sacubitril -valsartan  (ENTRESTO ) 49-51 MG Take 1 tablet by mouth 2 (two) times daily. 60 tablet 6   spironolactone  (ALDACTONE ) 25 MG tablet Take 1 tablet (25 mg total) by mouth daily. 90 tablet 3   metoprolol  succinate (TOPROL -XL) 100 MG 24 hr tablet Take 1 tablet (100 mg total) by mouth daily. Take with or immediately following a meal. 30 tablet 3   No current facility-administered medications for this encounter.   Wt Readings from Last 3 Encounters:  12/23/23 67.1 kg (148 lb)  12/22/23 67.6 kg (149 lb)  12/19/23 67.6 kg (149 lb)   BP 118/86   Pulse 91   Ht 5' 9 (1.753 m)   Wt 67.1 kg (148 lb)   SpO2 99%   BMI 21.86 kg/m   Physical Exam General: Well appearing. No distress on RA HEENT: neck supple.   Cardiac: JVP not visible. S1 and S2 present. No murmurs or rub. Resp: Lung sounds clear and  equal B/L Abdomen: Soft, non-tender, non-distended. + BS. Extremities: Warm and dry. No rash, cyanosis.  No peripheral edema.  Neuro: Alert and oriented x3. Affect pleasant. Moves all extremities without difficulty.  Assessment/Plan: 1. Chronic systolic CHF: Nonischemic cardiomyopathy, possibly due to ETOH.  Echo 3/21 showed EF 20% with moderately decreased RV systolic function by my read.  RHC/LHC from 3/21 showed no significant coronary disease, preserved cardiac output, R>L heart failure.  Cardiac MRI in 4/21 showed EF 14%, severe RV dysfunction, no LGE.  HIV negative.  No LGE noted on cardiac MRI but difficult LGE images, cannot rule out prior myocarditis.  Family history is not fully defined.  He has not been noted to have frequent PVCs.  Echo in 10/21 showed EF improved to 45-50%, echo in 4/22 showed stable EF 45-50%.  TEE in 5/23 showed EF 50-55%,  normal RV, trivial MR, mobile RA structure seen on TTE was [facility]. Echo in 8/24 with drop in EF to 25-30% in setting of heavy ETOH intake and noncompliance with meds. Echo 10/24 EF 25-30% on GDMT. LBBB with on ECG yesterday. He has cut back some on ETOH but is still drinking. Not volume overloaded by exam, NYHA class II- early III. ReDS 30% - Continue Lasix  20 mg PRN. BNP/BMET today - Continue Entresto  49-51 mg bid.   - Increase Toprol  XL to 100 mg daily.  - Continue Jardiance  10 mg daily.  - Continue spironolactone  25 mg daily  - Encouraged to continue to cut back on ETOH use.   - Seen by Dr. Fernande for CRT-D. Not sure that with age whether ICD would be beneficial for mortality. If not CRT-D or ICD candidate, could consider Barostim or CCM device at next appointment with progression of NYHA class.  2. LBBB: on today ECG yesterday - Repeat Echo 10/24: 25-30% after compliance with medications and cutting back on ETOH. - Seen by Dr. Fernande for CRT-D eval, and thought that LBBB was more rate depend and would benefit from better rate  control. Increase Toprol  as above.  3. ETOH abuse: Long history of heavy ETOH. This may be the cause of his cardiomyopathy.  He is still drinking but adamant it is much less. Discussed with wife and paramedic again today. 4. Hyponatremia: In setting of beer intake. Last Na 133 - BMET today   Follow up in 3 month with Dr. McLean   Iszabella Hebenstreit, NP 12/23/2023

## 2023-12-23 ENCOUNTER — Encounter (HOSPITAL_COMMUNITY): Payer: Self-pay

## 2023-12-23 ENCOUNTER — Other Ambulatory Visit (HOSPITAL_COMMUNITY): Payer: Self-pay | Admitting: Emergency Medicine

## 2023-12-23 ENCOUNTER — Ambulatory Visit (HOSPITAL_COMMUNITY)
Admission: RE | Admit: 2023-12-23 | Discharge: 2023-12-23 | Disposition: A | Discharge status: Home / Self Care | Payer: Medicare Other | Source: Ambulatory Visit | Attending: Family Medicine

## 2023-12-23 ENCOUNTER — Telehealth (HOSPITAL_COMMUNITY): Payer: Self-pay

## 2023-12-23 VITALS — BP 118/86 | HR 91 | Ht 69.0 in | Wt 148.0 lb

## 2023-12-23 DIAGNOSIS — I428 Other cardiomyopathies: Secondary | ICD-10-CM | POA: Diagnosis not present

## 2023-12-23 DIAGNOSIS — I110 Hypertensive heart disease with heart failure: Secondary | ICD-10-CM | POA: Insufficient documentation

## 2023-12-23 DIAGNOSIS — Z79899 Other long term (current) drug therapy: Secondary | ICD-10-CM | POA: Diagnosis not present

## 2023-12-23 DIAGNOSIS — I5022 Chronic systolic (congestive) heart failure: Secondary | ICD-10-CM

## 2023-12-23 DIAGNOSIS — E871 Hypo-osmolality and hyponatremia: Secondary | ICD-10-CM | POA: Diagnosis not present

## 2023-12-23 DIAGNOSIS — F1010 Alcohol abuse, uncomplicated: Secondary | ICD-10-CM | POA: Diagnosis not present

## 2023-12-23 DIAGNOSIS — Z7984 Long term (current) use of oral hypoglycemic drugs: Secondary | ICD-10-CM | POA: Insufficient documentation

## 2023-12-23 DIAGNOSIS — R0609 Other forms of dyspnea: Secondary | ICD-10-CM | POA: Diagnosis not present

## 2023-12-23 DIAGNOSIS — I447 Left bundle-branch block, unspecified: Secondary | ICD-10-CM

## 2023-12-23 DIAGNOSIS — Z91148 Patient's other noncompliance with medication regimen for other reason: Secondary | ICD-10-CM | POA: Insufficient documentation

## 2023-12-23 LAB — BASIC METABOLIC PANEL
Anion gap: 9 (ref 5–15)
BUN: 13 mg/dL (ref 8–23)
CO2: 19 mmol/L — ABNORMAL LOW (ref 22–32)
Calcium: 8.7 mg/dL — ABNORMAL LOW (ref 8.9–10.3)
Chloride: 100 mmol/L (ref 98–111)
Creatinine, Ser: 1.2 mg/dL (ref 0.61–1.24)
GFR, Estimated: >60 mL/min (ref >60)
Glucose, Bld: 84 mg/dL (ref 70–99)
Potassium: 4 mmol/L (ref 3.5–5.1)
Sodium: 128 mmol/L — ABNORMAL LOW (ref 135–145)

## 2023-12-23 LAB — BRAIN NATRIURETIC PEPTIDE: B Natriuretic Peptide: 4319 pg/mL — ABNORMAL HIGH (ref 0.0–100.0)

## 2023-12-23 MED ORDER — METOPROLOL SUCCINATE ER 100 MG PO TB24: 100.0000 mg | ORAL_TABLET | Freq: Every day | ORAL | 3 refills | Status: DC

## 2023-12-23 MED ORDER — FUROSEMIDE 20 MG PO TABS: ORAL_TABLET | ORAL | 5 refills | Status: DC

## 2023-12-23 NOTE — Progress Notes (Signed)
 ReDS Vest / Clip - 12/23/23 0825       ReDS Vest / Clip   Station Marker C    Ruler Value 29    ReDS Value Range Low volume    ReDS Actual Value 30

## 2023-12-23 NOTE — Telephone Encounter (Signed)
-----   Message from [place] Lee sent at 12/23/2023 10:42 AM EST ----- Fluid marker elevated again.  Please call and instruct patient to increase PRN Lasix to scheduled Monday, Wednesday, and Friday. Repeat BNP and BMET in 10-14 days.

## 2023-12-23 NOTE — Progress Notes (Signed)
 Paramedicine Encounter   Patient ID: Austin [name] , male,   DOB: 1956-08-17,67 y.o.,  MRN: [id number]   Met patient in clinic today with provider.   Tzphyu@ clinic- 148lb B/P-118/86 P- 91 SP02- 99%   Med changes today (if any) :  Changes will be based on labwork from today's visit.  Anticipate an increase in Metoprolol  and an addition of Furosemide  to weekly regimen.  Will wait to hear from provider.   Mary Sharps, EMT-Paramedic [phone] 12/23/2023

## 2023-12-23 NOTE — Patient Instructions (Signed)
 Medication Changes:  INCREASE METOPROLOL  SUCCINATE TO 100MG  ONCE DAILY   Lab Work:  Labs done today, your results will be available in MyChart, we will contact you for abnormal readings.  Follow-Up in: 3 MONTHS WITH DR. ROLAN PLEASE CALL OUR OFFICE AROUND LATE FEB/EARLY MARCH TO GET SCHEDULED FOR YOUR APPOINTMENT. PHONE NUMBER IS [phone] OPTION 2   At the [facility], you and your health needs are our priority. We have a designated team specialized in the treatment of Heart Failure. This Care Team includes your primary Heart Failure Specialized Cardiologist (physician), Advanced Practice Providers (APPs- Physician Assistants and Nurse Practitioners), and Pharmacist who all work together to provide you with the care you need, when you need it.   You may see any of the following providers on your designated Care Team at your next follow up:  Dr. Toribio Fuel Dr. Ezra Rolan Dr. Ria Commander Dr. Odis Brownie Greig Mosses, NP Caffie Shed, [place] [facility] [place], [place] Beckey Coe, NP Jordan Lee, NP Tinnie Redman, PharmD   Please be sure to bring in all your medications bottles to every appointment.   Need to Contact Us :  If you have any questions or concerns before your next appointment please send us  a message through [place] or call our office at [phone].    TO LEAVE A MESSAGE FOR THE NURSE SELECT OPTION 2, PLEASE LEAVE A MESSAGE INCLUDING: YOUR NAME DATE OF BIRTH CALL BACK NUMBER REASON FOR CALL**this is important as we prioritize the call backs  YOU WILL RECEIVE A CALL BACK THE SAME DAY AS LONG AS YOU CALL BEFORE 4:00 PM

## 2023-12-23 NOTE — Telephone Encounter (Signed)
 Sent Dede with Paramedicine a message of patient's lasix  medication change.  Patient's lasix  medication 1 tablet by mouth every Monday, Wednesday and Friday has been sent to pt's pharmacy and his med list updated. In addition, pt's labs has been placed and his appointment scheduled. Spoke to patient and his wife and they both are aware, agreeable, and verbalized understanding.

## 2023-12-24 ENCOUNTER — Telehealth (HOSPITAL_COMMUNITY): Payer: Self-pay | Admitting: Emergency Medicine

## 2023-12-24 NOTE — Telephone Encounter (Signed)
 Called both Mr. And Mrs Ac[place] respective phone numbers w/ no answer and unable to leave a voice mail.  Called to follow up on recent med change.  Will attempt to call back.    Ermalinda Hays, EMT-Paramedic [phone] 12/24/2023

## 2023-12-25 ENCOUNTER — Telehealth (HOSPITAL_COMMUNITY): Payer: Self-pay | Admitting: Emergency Medicine

## 2023-12-25 NOTE — Telephone Encounter (Signed)
Spoke with Mr. Suntken wife today regarding med changes and asked if I could come by and assist w/ med change reconciliation.  She advised she has put the Furosemide in his pill box Mon/Wed/Fri.  She says she hasn't p/u the Metoprolol yet but will call me as soon as she get them.   I will reach back out to her to follow up and make sure the changes get implemented.    Beatrix Shipper, EMT-Paramedic [phone] 12/25/2023

## 2023-12-30 ENCOUNTER — Other Ambulatory Visit (HOSPITAL_COMMUNITY): Payer: Self-pay | Admitting: Emergency Medicine

## 2023-12-30 NOTE — Progress Notes (Signed)
Paramedicine Encounter    Patient ID: Austin [name], male    DOB: June 11, 1956, 68 y.o.   MRN: [id number]   Complaints NONE  Assessment A&O x 4, skin W&D w/ good color.  Lung sounds clear throughout and no peripheral edema noted.  Compliance with meds Yes except did not increase his Metoprolol to 100mg  daily until today's visit  Pill box filled x 1 week  Refills needed NONE  Meds changes since last visit  Increased Metoprolol to 100mg  daily and Furosemide 20mg . M/W/F    Social changes NONE   BP 110/80 (BP Location: Left Arm, Patient Position: Standing, Cuff Size: Normal)   Pulse 88   Resp 16   Wt 146 lb (66.2 kg)   SpO2 93%   BMI 21.56 kg/m  Weight yesterday- not taken Last visit weight-149lb  Austin [name] reports to be feeling well.  I have not been able to do a home visit w/ him since his clinic visit on 12/23/23 where med changes were made.  He had gone out of town and left his pill box behind so he has been taking his meds out of the bottles.   During today's visit I was able to reconcile his pill box x 1 week.  Today's med box reflects changes that were requested by Austin Lee, NP.  He does report that he has been taking his Furosemide M/W/F but has not started his 100mg . Metoprolol until today. Austin [name] has a lab appointment 1/28 @ 8:30 and paramedicine home visit scheduled  1/29 @ 9:30.  ACTION: Home visit completed  Austin [name] [phone] 12/30/23  Patient Care Team: [name], FNP as PCP - General (General Practice) Little Ishikawa, MD as PCP - Cardiology (Cardiology)  Patient Active Problem List   Diagnosis Date Noted   Transient hypotension 06/05/2023   Alcohol use disorder 06/05/2023   Hypertensive 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 (HCC) 06/05/2023   Elevated brain natriuretic peptide (BNP) level 06/05/2023   Elevated LFTs 05/18/2023   Prolonged QT interval 05/18/2023    Hyponatremia 05/17/2023   Iron deficiency anemia 09/03/2020   NICM (nonischemic cardiomyopathy) (HCC)    ETOH abuse 11/10/2018   Essential hypertension 11/10/2018   Blood in stool 11/10/2018    Current Outpatient Medications:    empagliflozin (JARDIANCE) 10 MG TABS tablet, Take 1 tablet (10 mg total) by mouth daily before breakfast., Disp: 90 tablet, Rfl: 3   furosemide (LASIX) 20 MG tablet, Take 1 tablet by mouth every Monday Wednesday and Friday, Disp: 12 tablet, Rfl: 5   metoprolol succinate (TOPROL-XL) 100 MG 24 hr tablet, Take 1 tablet (100 mg total) by mouth daily. Take with or immediately following a meal., Disp: 30 tablet, Rfl: 3   Multiple Vitamin (MULTIVITAMIN) tablet, Take 1 tablet by mouth daily., Disp: , Rfl:    sacubitril-valsartan (ENTRESTO) 49-51 MG, Take 1 tablet by mouth 2 (two) times daily., Disp: 60 tablet, Rfl: 6   spironolactone (ALDACTONE) 25 MG tablet, Take 1 tablet (25 mg total) by mouth daily., Disp: 90 tablet, Rfl: 3 Allergies  Allergen Reactions   Latex Swelling and Rash     Social History   Socioeconomic History   Marital status: Married    Spouse name: Not on file   Number of children: 2   Years of education: Not on file   Highest education level: Bachelor's degree (e.g., BA, AB, BS)  Occupational History   Not on file  Tobacco Use  Smoking status: Never   Smokeless tobacco: Never  Vaping Use   Vaping status: Never Used  Substance and Sexual Activity   Alcohol use: Yes    Comment: 1-3 beers daily   Drug use: No   Sexual activity: Yes  Other Topics Concern   Not on file  Social History Narrative   Not on file   Social Drivers of Health   Financial Resource Strain: Low Risk  (02/27/2022)   Overall Financial Resource Strain (CARDIA)    Difficulty of Paying Living Expenses: Not hard at all  Food Insecurity: No Food Insecurity (05/21/2023)   Hunger Vital Sign    Worried About Running Out of Food in the Last Year: Never true    Ran Out of  Food in the Last Year: Never true  Transportation Needs: No Transportation Needs (06/18/2023)   PRAPARE - Administrator, Civil Service (Medical): No    Lack of Transportation (Non-Medical): No  Physical Activity: Inactive (02/27/2022)   Exercise Vital Sign    Days of Exercise per Week: 0 days    Minutes of Exercise per Session: 0 min  Stress: No Stress Concern Present (02/27/2022)   [employer] of Occupational Health - Occupational Stress Questionnaire    Feeling of Stress : Only a little  Social Connections: Socially Integrated (12/06/2020)   Social Connection and Isolation Panel [NHANES]    Frequency of Communication with Friends and Family: More than three times a week    Frequency of Social Gatherings with Friends and Family: More than three times a week    Attends Religious Services: More than 4 times per year    Active Member of [employer] or Organizations: Yes    Attends Banker Meetings: More than 4 times per year    Marital Status: Married  Catering manager Violence: Not At Risk (05/17/2023)   Humiliation, Afraid, Rape, and Kick questionnaire    Fear of Current or Ex-Partner: No    Emotionally Abused: No    Physically Abused: No    Sexually Abused: No    Physical Exam      Future Appointments  Date Time Provider Department Center  01/06/2024  8:30 AM [facility] None

## 2024-01-06 ENCOUNTER — Ambulatory Visit (HOSPITAL_COMMUNITY)
Admission: RE | Admit: 2024-01-06 | Discharge: 2024-01-06 | Disposition: A | Discharge status: Home / Self Care | Payer: Medicare Other | Source: Ambulatory Visit | Attending: Cardiology | Admitting: Cardiology

## 2024-01-06 DIAGNOSIS — I5022 Chronic systolic (congestive) heart failure: Secondary | ICD-10-CM | POA: Insufficient documentation

## 2024-01-06 LAB — BASIC METABOLIC PANEL
Anion gap: 10 (ref 5–15)
BUN: 16 mg/dL (ref 8–23)
CO2: 20 mmol/L — ABNORMAL LOW (ref 22–32)
Calcium: 9 mg/dL (ref 8.9–10.3)
Chloride: 97 mmol/L — ABNORMAL LOW (ref 98–111)
Creatinine, Ser: 1.18 mg/dL (ref 0.61–1.24)
GFR, Estimated: >60 mL/min (ref >60)
Glucose, Bld: 91 mg/dL (ref 70–99)
Potassium: 4.3 mmol/L (ref 3.5–5.1)
Sodium: 127 mmol/L — ABNORMAL LOW (ref 135–145)

## 2024-01-06 LAB — BRAIN NATRIURETIC PEPTIDE: B Natriuretic Peptide: >4500 pg/mL — ABNORMAL HIGH (ref 0.0–100.0)

## 2024-01-07 ENCOUNTER — Other Ambulatory Visit (HOSPITAL_COMMUNITY): Payer: Self-pay | Admitting: Emergency Medicine

## 2024-01-07 ENCOUNTER — Telehealth (HOSPITAL_COMMUNITY): Payer: Self-pay | Admitting: *Deleted

## 2024-01-07 DIAGNOSIS — I5022 Chronic systolic (congestive) heart failure: Secondary | ICD-10-CM

## 2024-01-07 MED ORDER — FUROSEMIDE 40 MG PO TABS: 40.0000 mg | ORAL_TABLET | Freq: Every day | ORAL | 3 refills | Status: DC

## 2024-01-07 NOTE — Telephone Encounter (Signed)
Called patient's wife per [place] Lee, NP with following lab results and instructions:  "Fluid marker remains significantly elevated. Please call and have him increase his Lasix dose to 40 mg daily and please schedule him an appointment in [facility] clinic in 1-2 weeks with repeat BMET and BNP to assess volume status."  Wife verbalized understanding of same. Updated Rx sent to requested pharmacy. [facility] Clinic appointment made with repeat labs at that time.

## 2024-01-07 NOTE — Progress Notes (Signed)
Paramedicine Encounter    Patient ID: Austin [name], male    DOB: Oct 30, 1956, 68 y.o.   MRN: [id number]   Complaints NONE  Assessment A&O x 4, skin W&D w/ good color.  Denies chest pain or SOB.  Lung sounds clear throughout.    Compliance with meds Missed Sat am/pm dose  Pill box filled x 1 week  Refills needed NONE  Meds changes since last visit increased Lasix 40mg  daily needs repeat labs 1-2 weeks    Social changes NONE   BP 90/60 (BP Location: Left Arm, Patient Position: Sitting, Cuff Size: Normal)   Pulse 86   Resp 16   Wt 146 lb (66.2 kg)   SpO2 100%   BMI 21.56 kg/m  Weight yesterday- not taken Last visit weight-146lb  Scheduled pt. Lab visit due to recent med change.    ACTION: Home visit completed  Bethanie Dicker [phone] 01/07/24  Patient Care Team: [name], FNP as PCP - General (General Practice) Little Ishikawa, MD as PCP - Cardiology (Cardiology)  Patient Active Problem List   Diagnosis Date Noted   Transient hypotension 06/05/2023   Alcohol use disorder 06/05/2023   Hypertensive 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 (HCC) 06/05/2023   Elevated brain natriuretic peptide (BNP) level 06/05/2023   Elevated LFTs 05/18/2023   Prolonged QT interval 05/18/2023   Hyponatremia 05/17/2023   Iron deficiency anemia 09/03/2020   NICM (nonischemic cardiomyopathy) (HCC)    ETOH abuse 11/10/2018   Essential hypertension 11/10/2018   Blood in stool 11/10/2018    Current Outpatient Medications:    empagliflozin (JARDIANCE) 10 MG TABS tablet, Take 1 tablet (10 mg total) by mouth daily before breakfast., Disp: 90 tablet, Rfl: 3   furosemide (LASIX) 20 MG tablet, Take 1 tablet by mouth every Monday Wednesday and Friday, Disp: 12 tablet, Rfl: 5   metoprolol succinate (TOPROL-XL) 100 MG 24 hr tablet, Take 1 tablet (100 mg total) by mouth daily. Take with or immediately following a meal.,  Disp: 30 tablet, Rfl: 3   Multiple Vitamin (MULTIVITAMIN) tablet, Take 1 tablet by mouth daily., Disp: , Rfl:    sacubitril-valsartan (ENTRESTO) 49-51 MG, Take 1 tablet by mouth 2 (two) times daily., Disp: 60 tablet, Rfl: 6   spironolactone (ALDACTONE) 25 MG tablet, Take 1 tablet (25 mg total) by mouth daily., Disp: 90 tablet, Rfl: 3 Allergies  Allergen Reactions   Latex Swelling and Rash     Social History   Socioeconomic History   Marital status: Married    Spouse name: Not on file   Number of children: 2   Years of education: Not on file   Highest education level: Bachelor's degree (e.g., BA, AB, BS)  Occupational History   Not on file  Tobacco Use   Smoking status: Never   Smokeless tobacco: Never  Vaping Use   Vaping status: Never Used  Substance and Sexual Activity   Alcohol use: Yes    Comment: 1-3 beers daily   Drug use: No   Sexual activity: Yes  Other Topics Concern   Not on file  Social History Narrative   Not on file   Social Drivers of Health   Financial Resource Strain: Low Risk  (02/27/2022)   Overall Financial Resource Strain (CARDIA)    Difficulty of Paying Living Expenses: Not hard at all  Food Insecurity: No Food Insecurity (05/21/2023)   Hunger Vital Sign    Worried About Running Out of Food  in the Last Year: Never true    Ran Out of Food in the Last Year: Never true  Transportation Needs: No Transportation Needs (06/18/2023)   PRAPARE - Administrator, Civil Service (Medical): No    Lack of Transportation (Non-Medical): No  Physical Activity: Inactive (02/27/2022)   Exercise Vital Sign    Days of Exercise per Week: 0 days    Minutes of Exercise per Session: 0 min  Stress: No Stress Concern Present (02/27/2022)   [employer] of Occupational Health - Occupational Stress Questionnaire    Feeling of Stress : Only a little  Social Connections: Socially Integrated (12/06/2020)   Social Connection and Isolation Panel [NHANES]     Frequency of Communication with Friends and Family: More than three times a week    Frequency of Social Gatherings with Friends and Family: More than three times a week    Attends Religious Services: More than 4 times per year    Active Member of [employer] or Organizations: Yes    Attends Banker Meetings: More than 4 times per year    Marital Status: Married  Catering manager Violence: Not At Risk (05/17/2023)   Humiliation, Afraid, Rape, and Kick questionnaire    Fear of Current or Ex-Partner: No    Emotionally Abused: No    Physically Abused: No    Sexually Abused: No    Physical Exam      No future appointments.

## 2024-01-10 ENCOUNTER — Encounter (HOSPITAL_COMMUNITY): Payer: Self-pay

## 2024-01-10 ENCOUNTER — Inpatient Hospital Stay (HOSPITAL_COMMUNITY)
Admission: EM | Admit: 2024-01-10 | Discharge: 2024-01-15 | DRG: 522 | Disposition: A | Discharge status: Home Health | Payer: Medicare Other | Attending: Internal Medicine | Admitting: Internal Medicine

## 2024-01-10 ENCOUNTER — Emergency Department (HOSPITAL_COMMUNITY): Payer: Medicare Other

## 2024-01-10 ENCOUNTER — Other Ambulatory Visit: Payer: Self-pay

## 2024-01-10 DIAGNOSIS — F1010 Alcohol abuse, uncomplicated: Secondary | ICD-10-CM | POA: Diagnosis not present

## 2024-01-10 DIAGNOSIS — I426 Alcoholic cardiomyopathy: Secondary | ICD-10-CM | POA: Diagnosis present

## 2024-01-10 DIAGNOSIS — D62 Acute posthemorrhagic anemia: Secondary | ICD-10-CM | POA: Insufficient documentation

## 2024-01-10 DIAGNOSIS — S72012A Unspecified intracapsular fracture of left femur, initial encounter for closed fracture: Principal | ICD-10-CM | POA: Diagnosis present

## 2024-01-10 DIAGNOSIS — W010XXA Fall on same level from slipping, tripping and stumbling without subsequent striking against object, initial encounter: Secondary | ICD-10-CM | POA: Diagnosis present

## 2024-01-10 DIAGNOSIS — S82002A Unspecified fracture of left patella, initial encounter for closed fracture: Secondary | ICD-10-CM | POA: Diagnosis not present

## 2024-01-10 DIAGNOSIS — N179 Acute kidney failure, unspecified: Secondary | ICD-10-CM | POA: Diagnosis present

## 2024-01-10 DIAGNOSIS — F1020 Alcohol dependence, uncomplicated: Secondary | ICD-10-CM | POA: Diagnosis present

## 2024-01-10 DIAGNOSIS — Z8546 Personal history of malignant neoplasm of prostate: Secondary | ICD-10-CM | POA: Diagnosis not present

## 2024-01-10 DIAGNOSIS — Y92009 Unspecified place in unspecified non-institutional (private) residence as the place of occurrence of the external cause: Secondary | ICD-10-CM | POA: Diagnosis not present

## 2024-01-10 DIAGNOSIS — E871 Hypo-osmolality and hyponatremia: Secondary | ICD-10-CM | POA: Diagnosis not present

## 2024-01-10 DIAGNOSIS — I110 Hypertensive heart disease with heart failure: Secondary | ICD-10-CM | POA: Diagnosis present

## 2024-01-10 DIAGNOSIS — Z91040 Latex allergy status: Secondary | ICD-10-CM | POA: Diagnosis not present

## 2024-01-10 DIAGNOSIS — I428 Other cardiomyopathies: Secondary | ICD-10-CM | POA: Diagnosis not present

## 2024-01-10 DIAGNOSIS — Z7984 Long term (current) use of oral hypoglycemic drugs: Secondary | ICD-10-CM | POA: Diagnosis not present

## 2024-01-10 DIAGNOSIS — W19XXXA Unspecified fall, initial encounter: Secondary | ICD-10-CM | POA: Diagnosis not present

## 2024-01-10 DIAGNOSIS — S72002A Fracture of unspecified part of neck of left femur, initial encounter for closed fracture: Principal | ICD-10-CM | POA: Diagnosis present

## 2024-01-10 DIAGNOSIS — I10 Essential (primary) hypertension: Secondary | ICD-10-CM | POA: Diagnosis not present

## 2024-01-10 DIAGNOSIS — E2740 Unspecified adrenocortical insufficiency: Secondary | ICD-10-CM | POA: Diagnosis not present

## 2024-01-10 DIAGNOSIS — I509 Heart failure, unspecified: Secondary | ICD-10-CM | POA: Diagnosis present

## 2024-01-10 DIAGNOSIS — I959 Hypotension, unspecified: Secondary | ICD-10-CM | POA: Diagnosis not present

## 2024-01-10 DIAGNOSIS — Z96642 Presence of left artificial hip joint: Secondary | ICD-10-CM | POA: Diagnosis not present

## 2024-01-10 DIAGNOSIS — S72042A Displaced fracture of base of neck of left femur, initial encounter for closed fracture: Secondary | ICD-10-CM | POA: Diagnosis not present

## 2024-01-10 DIAGNOSIS — K219 Gastro-esophageal reflux disease without esophagitis: Secondary | ICD-10-CM | POA: Diagnosis present

## 2024-01-10 DIAGNOSIS — Z79899 Other long term (current) drug therapy: Secondary | ICD-10-CM

## 2024-01-10 DIAGNOSIS — M25559 Pain in unspecified hip: Secondary | ICD-10-CM | POA: Diagnosis not present

## 2024-01-10 DIAGNOSIS — Z8249 Family history of ischemic heart disease and other diseases of the circulatory system: Secondary | ICD-10-CM

## 2024-01-10 DIAGNOSIS — D509 Iron deficiency anemia, unspecified: Secondary | ICD-10-CM | POA: Diagnosis not present

## 2024-01-10 DIAGNOSIS — M25562 Pain in left knee: Secondary | ICD-10-CM | POA: Diagnosis not present

## 2024-01-10 DIAGNOSIS — I5042 Chronic combined systolic (congestive) and diastolic (congestive) heart failure: Secondary | ICD-10-CM | POA: Diagnosis not present

## 2024-01-10 DIAGNOSIS — M1712 Unilateral primary osteoarthritis, left knee: Secondary | ICD-10-CM | POA: Diagnosis not present

## 2024-01-10 LAB — COMPREHENSIVE METABOLIC PANEL
ALT: 17 U/L (ref 0–44)
AST: 31 U/L (ref 15–41)
Albumin: 3.9 g/dL (ref 3.5–5.0)
Alkaline Phosphatase: 98 U/L (ref 38–126)
Anion gap: 13 (ref 5–15)
BUN: 20 mg/dL (ref 8–23)
CO2: 24 mmol/L (ref 22–32)
Calcium: 9.1 mg/dL (ref 8.9–10.3)
Chloride: 92 mmol/L — ABNORMAL LOW (ref 98–111)
Creatinine, Ser: 0.9 mg/dL (ref 0.61–1.24)
GFR, Estimated: >60 mL/min (ref >60)
Glucose, Bld: 114 mg/dL — ABNORMAL HIGH (ref 70–99)
Potassium: 4.2 mmol/L (ref 3.5–5.1)
Sodium: 129 mmol/L — ABNORMAL LOW (ref 135–145)
Total Bilirubin: 2.5 mg/dL — ABNORMAL HIGH (ref 0.0–1.2)
Total Protein: 8.2 g/dL — ABNORMAL HIGH (ref 6.5–8.1)

## 2024-01-10 LAB — CBC WITH DIFFERENTIAL/PLATELET
Abs Immature Granulocytes: 0.03 10*3/uL (ref 0.00–0.07)
Basophils Absolute: 0 10*3/uL (ref 0.0–0.1)
Basophils Relative: 0 %
Eosinophils Absolute: 0 10*3/uL (ref 0.0–0.5)
Eosinophils Relative: 0 %
HCT: 38.8 % — ABNORMAL LOW (ref 39.0–52.0)
Hemoglobin: 13 g/dL (ref 13.0–17.0)
Immature Granulocytes: 0 %
Lymphocytes Relative: 5 %
Lymphs Abs: 0.5 10*3/uL — ABNORMAL LOW (ref 0.7–4.0)
MCH: 31.6 pg (ref 26.0–34.0)
MCHC: 33.5 g/dL (ref 30.0–36.0)
MCV: 94.4 fL (ref 80.0–100.0)
Monocytes Absolute: 1.2 10*3/uL — ABNORMAL HIGH (ref 0.1–1.0)
Monocytes Relative: 14 %
Neutro Abs: 6.7 10*3/uL (ref 1.7–7.7)
Neutrophils Relative %: 81 %
Platelets: 207 10*3/uL (ref 150–400)
RBC: 4.11 MIL/uL — ABNORMAL LOW (ref 4.22–5.81)
RDW: 18.9 % — ABNORMAL HIGH (ref 11.5–15.5)
WBC: 8.3 10*3/uL (ref 4.0–10.5)
nRBC: 0 % (ref 0.0–0.2)

## 2024-01-10 MED ORDER — MORPHINE SULFATE (PF) 2 MG/ML IV SOLN
0.5000 mg | INTRAVENOUS | Status: DC | PRN
  Administered 2024-01-11 (×2): 0.5 mg via INTRAVENOUS
  Filled 2024-01-10 (×2): qty 1

## 2024-01-10 MED ORDER — LORAZEPAM 2 MG/ML IJ SOLN: 1.0000 mg | INTRAMUSCULAR | Status: AC | PRN

## 2024-01-10 MED ORDER — METHOCARBAMOL 500 MG PO TABS
500.0000 mg | ORAL_TABLET | Freq: Once | ORAL | Status: AC
  Administered 2024-01-10: 500 mg via ORAL
  Filled 2024-01-10: qty 1

## 2024-01-10 MED ORDER — ADULT MULTIVITAMIN W/MINERALS CH
1.0000 | ORAL_TABLET | Freq: Every day | ORAL | Status: DC
  Administered 2024-01-12 – 2024-01-15 (×4): 1 via ORAL
  Filled 2024-01-10 (×4): qty 1

## 2024-01-10 MED ORDER — LORAZEPAM 2 MG/ML IJ SOLN: 0.0000 mg | Freq: Four times a day (QID) | INTRAMUSCULAR | Status: AC

## 2024-01-10 MED ORDER — LORAZEPAM 1 MG PO TABS
1.0000 mg | ORAL_TABLET | ORAL | Status: AC | PRN
Start: 2024-01-10 — End: 2024-01-13
  Administered 2024-01-12: 1 mg via ORAL
  Filled 2024-01-10: qty 1

## 2024-01-10 MED ORDER — METHOCARBAMOL 500 MG PO TABS
500.0000 mg | ORAL_TABLET | Freq: Four times a day (QID) | ORAL | Status: DC | PRN
  Administered 2024-01-11 – 2024-01-13 (×4): 500 mg via ORAL
  Filled 2024-01-10 (×5): qty 1

## 2024-01-10 MED ORDER — THIAMINE MONONITRATE 100 MG PO TABS
100.0000 mg | ORAL_TABLET | Freq: Every day | ORAL | Status: DC
Start: 2024-01-11 — End: 2024-01-15
  Administered 2024-01-12 – 2024-01-14 (×3): 100 mg via ORAL
  Filled 2024-01-10 (×4): qty 1

## 2024-01-10 MED ORDER — HYDROMORPHONE HCL 1 MG/ML IJ SOLN
0.2000 mg | Freq: Once | INTRAMUSCULAR | Status: AC
  Administered 2024-01-10: 0.2 mg via INTRAVENOUS
  Filled 2024-01-10: qty 1

## 2024-01-10 MED ORDER — MORPHINE SULFATE (PF) 4 MG/ML IV SOLN
4.0000 mg | Freq: Once | INTRAVENOUS | Status: AC
  Administered 2024-01-10: 4 mg via INTRAMUSCULAR
  Filled 2024-01-10: qty 1

## 2024-01-10 MED ORDER — FOLIC ACID 1 MG PO TABS
1.0000 mg | ORAL_TABLET | Freq: Every day | ORAL | Status: DC
  Administered 2024-01-12 – 2024-01-15 (×4): 1 mg via ORAL
  Filled 2024-01-10 (×4): qty 1

## 2024-01-10 MED ORDER — MORPHINE SULFATE (PF) 4 MG/ML IV SOLN
4.0000 mg | Freq: Once | INTRAVENOUS | Status: AC
  Administered 2024-01-10: 4 mg via INTRAVENOUS
  Filled 2024-01-10: qty 1

## 2024-01-10 MED ORDER — HYDROMORPHONE HCL 1 MG/ML IJ SOLN
0.5000 mg | Freq: Once | INTRAMUSCULAR | Status: AC
  Administered 2024-01-10: 0.5 mg via INTRAVENOUS
  Filled 2024-01-10: qty 1

## 2024-01-10 MED ORDER — METHOCARBAMOL 1000 MG/10ML IJ SOLN: 500.0000 mg | Freq: Four times a day (QID) | INTRAMUSCULAR | Status: DC | PRN

## 2024-01-10 MED ORDER — THIAMINE HCL 100 MG/ML IJ SOLN
100.0000 mg | Freq: Every day | INTRAMUSCULAR | Status: DC
  Filled 2024-01-10 (×2): qty 2

## 2024-01-10 MED ORDER — LORAZEPAM 2 MG/ML IJ SOLN: 0.0000 mg | Freq: Two times a day (BID) | INTRAMUSCULAR | Status: AC

## 2024-01-10 NOTE — ED Notes (Signed)
PT reports pain in his Left hip and pt medicated per provider orders

## 2024-01-10 NOTE — ED Notes (Signed)
Pt requesting to eat and drink. [place], [place] notified

## 2024-01-10 NOTE — ED Provider Triage Note (Signed)
Emergency Medicine Provider Triage Evaluation Note  Austin [name] , a 68 y.o. male  was evaluated in triage.  Pt complains of left hip pain.  Patient reports he fell on his left side yesterday.  Denies LOC or head injury.  Review of Systems  Positive: Left hip pain Negative: LOC  Physical Exam  BP 124/80 (BP Location: Right Arm)   Pulse 81   Temp 98 F (36.7 C) (Oral)   Resp 16   SpO2 100%  Gen:   Awake, no distress   Resp:  Normal effort  MSK:   Decreased range of motion of left leg second to hip pain Other:    Medical Decision Making  Medically screening exam initiated at 8:23 AM.  Appropriate orders placed.  Austin [name] was informed that the remainder of the evaluation will be completed by another provider, this initial triage assessment does not replace that evaluation, and the importance of remaining in the ED until their evaluation is complete.    Maxwell Marion, PA-C 01/10/24 [phone]

## 2024-01-10 NOTE — H&P (Signed)
History and Physical    Austin [name]:[id number] DOB: 06-Aug-1956 DOA: 01/10/2024  Patient coming from: Home.  Chief Complaint: Fall.  HPI: Austin [name] is a 68 y.o. male with history of nonischemic cardiomyopathy likely related to his alcohol abuse, chronic hyponatremia was brought to the ER after patient had a fall at home.  Patient states that he was picking up vegetables at his home when he slipped and fell.  His wife was by his side and his wife states that he did not hit his head or lose consciousness.  After the fall he had pain in his left hip area and was brought to the ER.  ED Course: In the ER x-ray shows left hip comminuted fracture.  Orthopedic on-call was consulted.  Patient is being admitted for further workup and possible surgery in the morning for the left hip fracture.  Labs show sodium 129 creatinine 0.9.  Review of Systems: As per HPI, rest all negative.   Past Medical History:  Diagnosis Date   Arthritis    hands   CHF (congestive heart failure) (HCC)    GERD (gastroesophageal reflux disease)    Hypertension    Hypomagnesemia    Hyponatremia    Prostate cancer [facility])     Past Surgical History:  Procedure Laterality Date   LYMPHADENECTOMY Bilateral 01/26/2014   Procedure: LYMPHADENECTOMY WITH INDOCYANINE GREEN DYE;  Surgeon: Sebastian Ache, MD;  Location: [facility] ORS;  Service: Urology;  Laterality: Bilateral;   RIGHT/LEFT HEART CATH AND CORONARY ANGIOGRAPHY N/A 02/25/2020   Procedure: RIGHT/LEFT HEART CATH AND CORONARY ANGIOGRAPHY;  Surgeon: Kathleene Hazel, MD;  Location: [facility] INVASIVE [facility];  Service: Cardiovascular;  Laterality: N/A;   ROBOT ASSISTED LAPAROSCOPIC RADICAL PROSTATECTOMY N/A 01/26/2014   Procedure: ROBOTIC ASSISTED LAPAROSCOPIC RADICAL PROSTATECTOMY;  Surgeon: Sebastian Ache, MD;  Location: [facility] ORS;  Service: Urology;  Laterality: N/A;   SHOULDER SURGERY Left    TEE WITHOUT CARDIOVERSION N/A 04/15/2022   Procedure: TRANSESOPHAGEAL  ECHOCARDIOGRAM (TEE);  Surgeon: Laurey Morale, MD;  Location: [facility] ENDOSCOPY;  Service: Cardiovascular;  Laterality: N/A;     reports that he has never smoked. He has never used smokeless tobacco. He reports current alcohol use. He reports that he does not use drugs.  Allergies  Allergen Reactions   Latex Swelling and Rash    Family History  Problem Relation Age of Onset   Hypertension Mother    Hypertension Father     Prior to Admission medications   Medication Sig Start Date End Date Taking? Authorizing Provider  empagliflozin (JARDIANCE) 10 MG TABS tablet Take 1 tablet (10 mg total) by mouth daily before breakfast. 09/17/23   Laurey Morale, MD  furosemide (LASIX) 40 MG tablet Take 1 tablet (40 mg total) by mouth daily. 01/07/24   Lee, [place], NP  metoprolol succinate (TOPROL-XL) 100 MG 24 hr tablet Take 1 tablet (100 mg total) by mouth daily. Take with or immediately following a meal. 12/23/23   Lee, [place], NP  Multiple Vitamin (MULTIVITAMIN) tablet Take 1 tablet by mouth daily.    [provider]  sacubitril-valsartan (ENTRESTO) 49-51 MG Take 1 tablet by mouth 2 (two) times daily. 06/25/23   Clegg, Amy D, NP  spironolactone (ALDACTONE) 25 MG tablet Take 1 tablet (25 mg total) by mouth daily. 09/30/23   Laurey Morale, MD    Physical Exam: Constitutional: Moderately built and nourished. Vitals:   01/10/24 1530 01/10/24 1545 01/10/24 1940 01/10/24 2116  BP: 127/77 133/67 Marland Kitchen)  123/92 122/85  Pulse: 79 87 94 98  Resp: 18 18 18 16   Temp:  98.5 F (36.9 C) 99 F (37.2 C) 98.1 F (36.7 C)  TempSrc:  Oral Oral Oral  SpO2: 100% 99% 100% 100%   Eyes: Anicteric no pallor. ENMT: No discharge from the ears eyes nose or mouth. Neck: No mass felt.  No neck rigidity. Respiratory: No rhonchi or crepitations. Cardiovascular: S1-S2 heard. Abdomen: Soft nontender bowel sounds present. Musculoskeletal: Pain on remaining left hip. Skin: No rash. Neurologic: Alert awake  oriented to time place and person.  Moves all extremities. Psychiatric: Appears normal.  Normal affect.   Labs on Admission: I have personally reviewed following labs and imaging studies  CBC: Recent Labs  Lab 01/10/24 1617  WBC 8.3  NEUTROABS 6.7  HGB 13.0  HCT 38.8*  MCV 94.4  PLT 207   Basic Metabolic Panel: Recent Labs  Lab 01/06/24 0834 01/10/24 1830  NA 127* 129*  K 4.3 4.2  CL 97* 92*  CO2 20* 24  GLUCOSE 91 114*  BUN 16 20  CREATININE 1.18 0.90  CALCIUM 9.0 9.1   GFR: Estimated Creatinine Clearance: 73.6 mL/min (by C-G formula based on SCr of 0.9 mg/dL). Liver Function Tests: Recent Labs  Lab 01/10/24 1830  AST 31  ALT 17  ALKPHOS 98  BILITOT 2.5*  PROT 8.2*  ALBUMIN 3.9   No results for input(s): "LIPASE", "AMYLASE" in the last 168 hours. No results for input(s): "AMMONIA" in the last 168 hours. Coagulation Profile: No results for input(s): "INR", "PROTIME" in the last 168 hours. Cardiac Enzymes: No results for input(s): "CKTOTAL", "CKMB", "CKMBINDEX", "TROPONINI" in the last 168 hours. BNP (last 3 results) No results for input(s): "PROBNP" in the last 8760 hours. HbA1C: No results for input(s): "HGBA1C" in the last 72 hours. CBG: No results for input(s): "GLUCAP" in the last 168 hours. Lipid Profile: No results for input(s): "CHOL", "HDL", "LDLCALC", "TRIG", "CHOLHDL", "LDLDIRECT" in the last 72 hours. Thyroid Function Tests: No results for input(s): "TSH", "T4TOTAL", "FREET4", "T3FREE", "THYROIDAB" in the last 72 hours. Anemia Panel: No results for input(s): "VITAMINB12", "FOLATE", "FERRITIN", "TIBC", "IRON", "RETICCTPCT" in the last 72 hours. Urine analysis:    Component Value Date/Time   COLORURINE YELLOW 05/17/2023 1227   APPEARANCEUR CLEAR 05/17/2023 1227   LABSPEC 1.006 05/17/2023 1227   PHURINE 6.0 05/17/2023 1227   GLUCOSEU NEGATIVE 05/17/2023 1227   HGBUR NEGATIVE 05/17/2023 1227   BILIRUBINUR NEGATIVE 05/17/2023 1227    BILIRUBINUR negative 08/14/2022 1408   KETONESUR TRACE (A) 05/17/2023 1227   PROTEINUR 30 (A) 05/17/2023 1227   UROBILINOGEN 1.0 08/14/2022 1408   UROBILINOGEN 1.0 08/27/2007 2227   NITRITE NEGATIVE 05/17/2023 1227   LEUKOCYTESUR NEGATIVE 05/17/2023 1227   Sepsis Labs: @LABRCNTIP (procalcitonin:4,lacticidven:4) )No results found for this or any previous visit (from the past 240 hours).   Radiological Exams on Admission: DG HIP UNILAT WITH PELVIS 2-3 VIEWS LEFT Result Date: 01/10/2024 CLINICAL DATA:  Fall yesterday, pain.  Unable to bear weight. EXAM: DG HIP (WITH OR WITHOUT PELVIS) 2-3V LEFT COMPARISON:  None. FINDINGS: Mildly comminuted left femoral neck fracture with some superior displacement of the distal fracture fragment. No dislocation. No additional evidence of an acute fracture. IMPRESSION: Mildly comminuted left femoral neck fracture. Electronically Signed   By: Leanna Battles M.D.   On: 01/10/2024 15:03    EKG: Independently reviewed.  Normal sinus rhythm.  Assessment/Plan Principal Problem:   Closed left hip fracture (HCC) Active Problems:  ETOH abuse   Essential hypertension   NICM (nonischemic cardiomyopathy) (HCC)   Hyponatremia    Left hip fracture after mechanical fall -     orthopedics on-call has been consulted.  Plan is to keep patient n.p.o. past midnight in anticipation of surgery in the morning. Nonischemic cardiomyopathy last EF measured was 25 to 30% on 12//24.  Cardiomyopathy likely related to alcoholism.  Will continue patient's Toprol-XL Entresto spironolactone.  Hold Lasix until surgery.  Appears compensated at this time.  Restart Jardiance at discharge. EtOH abuse on CIWA protocol.  Thiamine. Chronic hyponatremia likely related to alcohol abuse.  Any further worsening may have to hold spironolactone.  Follow metabolic panel.  Since patient has left hip fracture will need further management including surgery and more than 2 midnight stay and inpatient  status.   DVT prophylaxis: SCDs. Code Status: Full code. Family Communication: Patient's wife at the bedside. Disposition Plan: Monitored bed. Consults called: Orthopedics. Admission status: Inpatient.

## 2024-01-10 NOTE — ED Provider Notes (Addendum)
[place] EMERGENCY DEPARTMENT AT [facility] Provider Note   CSN: [id number] Arrival date & time: 01/10/24  0102     History  Chief Complaint  Patient presents with   Austin [name] is a 68 y.o. male.   Fall  Pt presents to the ED today complaining of L hip pain after a mechanical fall yesterday while reaching for potatoes on top of the fridge and lost balance falling onto L side. Has not ambulated after fall. Denies hitting head, denies LOC, denies blood thinners. Reports one episode of vomiting while in the back of ambulance which he says was to being car sick.  Endorses chronic shortness of breath (unchanged from baseline.)  Denies fever, cough, congestion, blurry vision, vertigo, chest pain, abdominal pain,UE pain, R LE pain, swelling, diarrhea, hematuria, hematochezia.      Home Medications Prior to Admission medications   Medication Sig Start Date End Date Taking? Authorizing Provider  empagliflozin (JARDIANCE) 10 MG TABS tablet Take 1 tablet (10 mg total) by mouth daily before breakfast. 09/17/23   Laurey Morale, MD  furosemide (LASIX) 40 MG tablet Take 1 tablet (40 mg total) by mouth daily. 01/07/24   Lee, [place], NP  metoprolol succinate (TOPROL-XL) 100 MG 24 hr tablet Take 1 tablet (100 mg total) by mouth daily. Take with or immediately following a meal. 12/23/23   Lee, [place], NP  Multiple Vitamin (MULTIVITAMIN) tablet Take 1 tablet by mouth daily.    [provider]  sacubitril-valsartan (ENTRESTO) 49-51 MG Take 1 tablet by mouth 2 (two) times daily. 06/25/23   Clegg, Amy D, NP  spironolactone (ALDACTONE) 25 MG tablet Take 1 tablet (25 mg total) by mouth daily. 09/30/23   Laurey Morale, MD      Allergies    Latex    Review of Systems   Review of Systems  Musculoskeletal:  Positive for arthralgias.  All other systems reviewed and are negative.   Physical Exam Updated Vital Signs BP (!) 123/92   Pulse 94   Temp 99 F (37.2  C) (Oral)   Resp 18   SpO2 100%  Physical Exam Vitals and nursing note reviewed.  Constitutional:      Appearance: Normal appearance.  HENT:     Head: Normocephalic and atraumatic.  Eyes:     Extraocular Movements: Extraocular movements intact.     Conjunctiva/sclera: Conjunctivae normal.  Cardiovascular:     Rate and Rhythm: Normal rate and regular rhythm.     Pulses: Normal pulses.     Heart sounds: Normal heart sounds. No murmur heard.    No friction rub. No gallop.     Comments: DP pulses 2+ bilaterally.  Pulmonary:     Effort: Pulmonary effort is normal. No respiratory distress.     Breath sounds: Normal breath sounds.  Abdominal:     General: Abdomen is flat.     Palpations: Abdomen is soft.     Tenderness: There is no abdominal tenderness.  Musculoskeletal:        General: Tenderness (L hip pain to palpation noted) present.     Comments: No TTP in head, cervical, thoracic, lumbar spine, UE, RLE    Skin:    General: Skin is warm and dry.     Coloration: Skin is not jaundiced or pale.     Findings: No erythema.  Neurological:     General: No focal deficit present.     Mental Status: He is alert  and oriented to person, place, and time. Mental status is at baseline.     Sensory: No sensory deficit.     Comments: LE sensation intact bilaterally.   Psychiatric:        Mood and Affect: Mood normal.     ED Results / Procedures / Treatments   Labs (all labs ordered are listed, but only abnormal results are displayed) Labs Reviewed  CBC WITH DIFFERENTIAL/PLATELET - Abnormal; Notable for the following components:      Result Value   RBC 4.11 (*)    HCT 38.8 (*)    RDW 18.9 (*)    Lymphs Abs 0.5 (*)    Monocytes Absolute 1.2 (*)    All other components within normal limits  COMPREHENSIVE METABOLIC PANEL - Abnormal; Notable for the following components:   Sodium 129 (*)    Chloride 92 (*)    Glucose, Bld 114 (*)    Total Protein 8.2 (*)    Total Bilirubin 2.5  (*)    All other components within normal limits    EKG None  Radiology DG HIP UNILAT WITH PELVIS 2-3 VIEWS LEFT Result Date: 01/10/2024 CLINICAL DATA:  Fall yesterday, pain.  Unable to bear weight. EXAM: DG HIP (WITH OR WITHOUT PELVIS) 2-3V LEFT COMPARISON:  None. FINDINGS: Mildly comminuted left femoral neck fracture with some superior displacement of the distal fracture fragment. No dislocation. No additional evidence of an acute fracture. IMPRESSION: Mildly comminuted left femoral neck fracture. Electronically Signed   By: Leanna Battles M.D.   On: 01/10/2024 15:03    Procedures Procedures    Medications Ordered in ED Medications  morphine (PF) 4 MG/ML injection 4 mg (4 mg Intramuscular Given 01/10/24 0958)  morphine (PF) 4 MG/ML injection 4 mg (4 mg Intravenous Given 01/10/24 1626)    ED Course/ Medical Decision Making/ A&P Clinical Course as of 01/10/24 2001  Sat Jan 10, 2024  1951 Spoke with Ortho who said they will make him n.p.o. at midnight.  Patient is otherwise good to be admitted to medicine at this time.  They will follow-up with him in the morning tomorrow. [CB]    Clinical Course User Index [CB] Lavonia Drafts   {                             Medical Decision Making Amount and/or Complexity of Data Reviewed Labs: ordered.  Risk Prescription drug management. Decision regarding hospitalization.   This patient is a 68 year old male who presents to the ED for concern of fall yesterday resulting in pain in the left side, flank, hip.   Differential diagnoses prior to evaluation: The emergent differential diagnosis includes, but is not limited to, femur fracture, pelvic fracture, dislocation,. This is not an exhaustive differential.   Past Medical History / Co-morbidities / Social History: Prostate cancer, hypertension, GERD, CHF, prolonged QT, alcohol use disorder  Additional history: Chart reviewed. Pertinent results include:   Last echo was on  11/12/2023 which showed an EF of 25 to 30%.  Mildly elevated pulmonary artery systolic pressure.  Lab Tests/Imaging studies: I personally interpreted labs/imaging and the pertinent results include:    X-ray hip with pelvis shows comminuted left femoral neck fracture. I agree with the radiologist interpretation.    Medications: I ordered medication including morphine.  I have reviewed the patients home medicines and have made adjustments as needed.  ED Course:   68 year old male presents to ED  after referral along with left hip pain.  He fell on his left side after he was reaching for potatoes on top of his fridge when he lost balance.  Landed on left hip and left shoulder.  Denies LOC, head trauma, blood thinners.  Currently only endorses left hip pain without any other complaints at this time.  Physical exam was markable for tenderness to palpation over the left hip.  However DP pulses were 2+ bilaterally with normal sensation intact bilaterally in lower extremities.  Exam was otherwise unremarkable.  X-ray was noted to show mildly comminuted left femoral neck fracture.  Labs were then drawn at that time with CBC being unremarkable and at baseline and CMP showed mild hyponatremia and elevated bilirubin.  Dr. Hulda Humphrey, ortho, was then consulted.  Stated that he will be n.p.o. at midnight and will round on him in the morning.  That he can be admitted to medicine.  Hospitalist was then consulted and patient was admitted to medicine.  Patient care at that time was transferred over to Dr. Toniann Fail   Disposition: After consideration of the diagnostic results and the patients response to treatment, I feel that benefit from admission and follow-up with Ortho in the hospital for evaluation. Dr. Hulda Humphrey was the orthopedist consulted and patient care was transferred over to Dr. Toniann Fail.    Final Clinical Impression(s) / ED Diagnoses Final diagnoses:  Closed fracture of neck of left femur, initial  encounter [facility])    Rx / DC Orders ED Discharge Orders     None         Lunette Stands, PA-C 01/10/24 [address] 01/10/24 2002    Lonell Grandchild, MD 01/10/24 [phone]

## 2024-01-10 NOTE — ED Triage Notes (Signed)
Pt BIB [facility] from home with reports of a fall yesterday and pain to his left side, flank and hip.

## 2024-01-11 ENCOUNTER — Inpatient Hospital Stay (HOSPITAL_COMMUNITY): Payer: Medicare Other

## 2024-01-11 ENCOUNTER — Inpatient Hospital Stay (HOSPITAL_COMMUNITY): Payer: Medicare Other | Admitting: Anesthesiology

## 2024-01-11 ENCOUNTER — Encounter (HOSPITAL_COMMUNITY)
Admission: EM | Disposition: A | Discharge status: Home Health | Payer: Self-pay | Source: Home / Self Care | Attending: Family Medicine

## 2024-01-11 ENCOUNTER — Encounter (HOSPITAL_COMMUNITY): Payer: Self-pay | Admitting: Internal Medicine

## 2024-01-11 DIAGNOSIS — I428 Other cardiomyopathies: Secondary | ICD-10-CM | POA: Diagnosis not present

## 2024-01-11 DIAGNOSIS — S72002A Fracture of unspecified part of neck of left femur, initial encounter for closed fracture: Secondary | ICD-10-CM | POA: Diagnosis not present

## 2024-01-11 DIAGNOSIS — I509 Heart failure, unspecified: Secondary | ICD-10-CM | POA: Diagnosis not present

## 2024-01-11 DIAGNOSIS — I110 Hypertensive heart disease with heart failure: Secondary | ICD-10-CM | POA: Diagnosis not present

## 2024-01-11 DIAGNOSIS — E871 Hypo-osmolality and hyponatremia: Secondary | ICD-10-CM | POA: Diagnosis not present

## 2024-01-11 HISTORY — PX: TOTAL HIP ARTHROPLASTY: SHX124

## 2024-01-11 LAB — BASIC METABOLIC PANEL
Anion gap: 12 (ref 5–15)
BUN: 18 mg/dL (ref 8–23)
CO2: 23 mmol/L (ref 22–32)
Calcium: 9.1 mg/dL (ref 8.9–10.3)
Chloride: 92 mmol/L — ABNORMAL LOW (ref 98–111)
Creatinine, Ser: 0.77 mg/dL (ref 0.61–1.24)
GFR, Estimated: >60 mL/min (ref >60)
Glucose, Bld: 100 mg/dL — ABNORMAL HIGH (ref 70–99)
Potassium: 3.9 mmol/L (ref 3.5–5.1)
Sodium: 127 mmol/L — ABNORMAL LOW (ref 135–145)

## 2024-01-11 LAB — CBC
HCT: 37.8 % — ABNORMAL LOW (ref 39.0–52.0)
Hemoglobin: 13 g/dL (ref 13.0–17.0)
MCH: 31.9 pg (ref 26.0–34.0)
MCHC: 34.4 g/dL (ref 30.0–36.0)
MCV: 92.9 fL (ref 80.0–100.0)
Platelets: 190 10*3/uL (ref 150–400)
RBC: 4.07 MIL/uL — ABNORMAL LOW (ref 4.22–5.81)
RDW: 18.7 % — ABNORMAL HIGH (ref 11.5–15.5)
WBC: 8 10*3/uL (ref 4.0–10.5)
nRBC: 0 % (ref 0.0–0.2)

## 2024-01-11 SURGERY — ARTHROPLASTY, HIP, TOTAL, ANTERIOR APPROACH
Anesthesia: General | Site: Hip | Laterality: Left

## 2024-01-11 MED ORDER — ACETAMINOPHEN 325 MG PO TABS
325.0000 mg | ORAL_TABLET | Freq: Four times a day (QID) | ORAL | Status: DC | PRN
Start: 2024-01-12 — End: 2024-01-15

## 2024-01-11 MED ORDER — MIDAZOLAM HCL 5 MG/5ML IJ SOLN
INTRAMUSCULAR | Status: DC | PRN
  Administered 2024-01-11 (×2): 1 mg via INTRAVENOUS

## 2024-01-11 MED ORDER — ROCURONIUM BROMIDE 10 MG/ML (PF) SYRINGE
PREFILLED_SYRINGE | INTRAVENOUS | Status: DC | PRN
  Administered 2024-01-11: 60 mg via INTRAVENOUS

## 2024-01-11 MED ORDER — PHENYLEPHRINE HCL-NACL 20-0.9 MG/250ML-% IV SOLN
INTRAVENOUS | Status: DC | PRN
  Administered 2024-01-11: 55 ug/min via INTRAVENOUS

## 2024-01-11 MED ORDER — LIDOCAINE HCL (PF) 2 % IJ SOLN
INTRAMUSCULAR | Status: AC
  Filled 2024-01-11: qty 5

## 2024-01-11 MED ORDER — SODIUM CHLORIDE 0.9 % IR SOLN
Status: DC | PRN
  Administered 2024-01-11: 1000 mL

## 2024-01-11 MED ORDER — VANCOMYCIN HCL 1 G IV SOLR
INTRAVENOUS | Status: DC | PRN
  Administered 2024-01-11: 1000 mg via TOPICAL

## 2024-01-11 MED ORDER — DIPHENHYDRAMINE HCL 12.5 MG/5ML PO ELIX
12.5000 mg | ORAL_SOLUTION | ORAL | Status: DC | PRN
Start: 2024-01-11 — End: 2024-01-15

## 2024-01-11 MED ORDER — POVIDONE-IODINE 10 % EX SWAB: 2.0000 | Freq: Once | CUTANEOUS | Status: DC

## 2024-01-11 MED ORDER — ACETAMINOPHEN 10 MG/ML IV SOLN
INTRAVENOUS | Status: AC
  Filled 2024-01-11: qty 100

## 2024-01-11 MED ORDER — METOCLOPRAMIDE HCL 10 MG PO TABS: 5.0000 mg | ORAL_TABLET | Freq: Three times a day (TID) | ORAL | Status: DC | PRN

## 2024-01-11 MED ORDER — SODIUM CHLORIDE 0.9 % IV SOLN: INTRAVENOUS | Status: DC

## 2024-01-11 MED ORDER — LACTATED RINGERS IV SOLN: INTRAVENOUS | Status: DC | PRN

## 2024-01-11 MED ORDER — BUPIVACAINE-EPINEPHRINE 0.25% -1:200000 IJ SOLN
INTRAMUSCULAR | Status: AC
  Filled 2024-01-11: qty 1

## 2024-01-11 MED ORDER — CHLORHEXIDINE GLUCONATE 4 % EX SOLN: 60.0000 mL | Freq: Once | CUTANEOUS | Status: DC

## 2024-01-11 MED ORDER — MIDAZOLAM HCL 2 MG/2ML IJ SOLN
INTRAMUSCULAR | Status: AC
  Filled 2024-01-11: qty 2

## 2024-01-11 MED ORDER — PANTOPRAZOLE SODIUM 40 MG PO TBEC
40.0000 mg | DELAYED_RELEASE_TABLET | Freq: Every day | ORAL | Status: DC
  Administered 2024-01-11 – 2024-01-15 (×5): 40 mg via ORAL
  Filled 2024-01-11 (×5): qty 1

## 2024-01-11 MED ORDER — SODIUM CHLORIDE (PF) 0.9 % IJ SOLN
INTRAMUSCULAR | Status: AC
  Filled 2024-01-11: qty 50

## 2024-01-11 MED ORDER — ASPIRIN 81 MG PO CHEW: 81.0000 mg | CHEWABLE_TABLET | Freq: Two times a day (BID) | ORAL | 0 refills | Status: AC

## 2024-01-11 MED ORDER — METOCLOPRAMIDE HCL 5 MG/ML IJ SOLN: 5.0000 mg | Freq: Three times a day (TID) | INTRAMUSCULAR | Status: DC | PRN

## 2024-01-11 MED ORDER — OXYCODONE HCL 5 MG/5ML PO SOLN: 5.0000 mg | Freq: Once | ORAL | Status: DC | PRN

## 2024-01-11 MED ORDER — VANCOMYCIN HCL 1000 MG IV SOLR
INTRAVENOUS | Status: AC
  Filled 2024-01-11: qty 20

## 2024-01-11 MED ORDER — CEFAZOLIN SODIUM-DEXTROSE 2-4 GM/100ML-% IV SOLN
2.0000 g | INTRAVENOUS | Status: AC
  Administered 2024-01-11: 2 g via INTRAVENOUS

## 2024-01-11 MED ORDER — SUGAMMADEX SODIUM 200 MG/2ML IV SOLN
INTRAVENOUS | Status: DC | PRN
  Administered 2024-01-11: 200 mg via INTRAVENOUS

## 2024-01-11 MED ORDER — WATER FOR IRRIGATION, STERILE IR SOLN
Status: DC | PRN
  Administered 2024-01-11: 1000 mL

## 2024-01-11 MED ORDER — OXYCODONE HCL 5 MG PO TABS
5.0000 mg | ORAL_TABLET | Freq: Once | ORAL | Status: DC | PRN
Start: 2024-01-11 — End: 2024-01-11

## 2024-01-11 MED ORDER — ONDANSETRON HCL 4 MG/2ML IJ SOLN: 4.0000 mg | Freq: Once | INTRAMUSCULAR | Status: DC | PRN

## 2024-01-11 MED ORDER — ONDANSETRON HCL 4 MG/2ML IJ SOLN
INTRAMUSCULAR | Status: DC | PRN
  Administered 2024-01-11: 4 mg via INTRAVENOUS

## 2024-01-11 MED ORDER — CELECOXIB 200 MG PO CAPS
ORAL_CAPSULE | ORAL | Status: AC
  Filled 2024-01-11: qty 2

## 2024-01-11 MED ORDER — PROPOFOL 10 MG/ML IV BOLUS
INTRAVENOUS | Status: DC | PRN
  Administered 2024-01-11: 100 mg via INTRAVENOUS

## 2024-01-11 MED ORDER — SODIUM CHLORIDE 0.9 % IV BOLUS
500.0000 mL | Freq: Once | INTRAVENOUS | Status: AC
  Administered 2024-01-11: 500 mL via INTRAVENOUS

## 2024-01-11 MED ORDER — DOCUSATE SODIUM 100 MG PO CAPS
100.0000 mg | ORAL_CAPSULE | Freq: Two times a day (BID) | ORAL | Status: DC
  Administered 2024-01-11 – 2024-01-15 (×8): 100 mg via ORAL
  Filled 2024-01-11 (×8): qty 1

## 2024-01-11 MED ORDER — HYDROCODONE-ACETAMINOPHEN 5-325 MG PO TABS
1.0000 | ORAL_TABLET | ORAL | Status: DC | PRN
  Administered 2024-01-11: 1 via ORAL
  Administered 2024-01-12 – 2024-01-13 (×2): 2 via ORAL
  Administered 2024-01-13 – 2024-01-15 (×3): 1 via ORAL
  Filled 2024-01-11 (×3): qty 1
  Filled 2024-01-11 (×2): qty 2
  Filled 2024-01-11: qty 1

## 2024-01-11 MED ORDER — ASPIRIN 81 MG PO CHEW
81.0000 mg | CHEWABLE_TABLET | Freq: Two times a day (BID) | ORAL | Status: DC
  Administered 2024-01-11 – 2024-01-15 (×8): 81 mg via ORAL
  Filled 2024-01-11 (×8): qty 1

## 2024-01-11 MED ORDER — POLYETHYLENE GLYCOL 3350 17 G PO PACK: 17.0000 g | PACK | Freq: Every day | ORAL | Status: DC | PRN

## 2024-01-11 MED ORDER — CELECOXIB 200 MG PO CAPS: 400.0000 mg | ORAL_CAPSULE | Freq: Once | ORAL | Status: DC

## 2024-01-11 MED ORDER — LIDOCAINE 2% (20 MG/ML) 5 ML SYRINGE
INTRAMUSCULAR | Status: DC | PRN
  Administered 2024-01-11: 60 mg via INTRAVENOUS

## 2024-01-11 MED ORDER — ESMOLOL HCL 100 MG/10ML IV SOLN
INTRAVENOUS | Status: DC | PRN
  Administered 2024-01-11 (×3): 10 mg via INTRAVENOUS

## 2024-01-11 MED ORDER — MENTHOL 3 MG MT LOZG: 1.0000 | LOZENGE | OROMUCOSAL | Status: DC | PRN

## 2024-01-11 MED ORDER — PHENOL 1.4 % MT LIQD: 1.0000 | OROMUCOSAL | Status: DC | PRN

## 2024-01-11 MED ORDER — MORPHINE SULFATE (PF) 2 MG/ML IV SOLN: 0.5000 mg | INTRAVENOUS | Status: DC | PRN

## 2024-01-11 MED ORDER — VASOPRESSIN 20 UNIT/ML IV SOLN
INTRAVENOUS | Status: AC
  Filled 2024-01-11: qty 1

## 2024-01-11 MED ORDER — PROPOFOL 10 MG/ML IV BOLUS
INTRAVENOUS | Status: AC
  Filled 2024-01-11: qty 20

## 2024-01-11 MED ORDER — TRANEXAMIC ACID-NACL 1000-0.7 MG/100ML-% IV SOLN
1000.0000 mg | INTRAVENOUS | Status: AC
  Administered 2024-01-11: 1000 mg via INTRAVENOUS

## 2024-01-11 MED ORDER — DEXAMETHASONE SODIUM PHOSPHATE 10 MG/ML IJ SOLN
INTRAMUSCULAR | Status: DC | PRN
  Administered 2024-01-11: 8 mg via INTRAVENOUS

## 2024-01-11 MED ORDER — BISACODYL 10 MG RE SUPP: 10.0000 mg | Freq: Every day | RECTAL | Status: DC | PRN

## 2024-01-11 MED ORDER — FENTANYL CITRATE (PF) 100 MCG/2ML IJ SOLN
INTRAMUSCULAR | Status: DC | PRN
  Administered 2024-01-11 (×4): 50 ug via INTRAVENOUS

## 2024-01-11 MED ORDER — CEFAZOLIN SODIUM-DEXTROSE 2-4 GM/100ML-% IV SOLN
INTRAVENOUS | Status: AC
  Filled 2024-01-11: qty 100

## 2024-01-11 MED ORDER — ISOPROPYL ALCOHOL 70 % SOLN
Status: DC | PRN
  Administered 2024-01-11: 1 via TOPICAL

## 2024-01-11 MED ORDER — CEFAZOLIN SODIUM-DEXTROSE 2-4 GM/100ML-% IV SOLN
2.0000 g | Freq: Four times a day (QID) | INTRAVENOUS | Status: AC
  Administered 2024-01-11 (×2): 2 g via INTRAVENOUS
  Filled 2024-01-11 (×2): qty 100

## 2024-01-11 MED ORDER — HYDROCODONE-ACETAMINOPHEN 5-325 MG PO TABS: 1.0000 | ORAL_TABLET | ORAL | 0 refills | Status: AC | PRN

## 2024-01-11 MED ORDER — SENNA 8.6 MG PO TABS
1.0000 | ORAL_TABLET | Freq: Two times a day (BID) | ORAL | Status: DC
  Administered 2024-01-11 – 2024-01-15 (×6): 8.6 mg via ORAL
  Filled 2024-01-11 (×7): qty 1

## 2024-01-11 MED ORDER — KETOROLAC TROMETHAMINE 30 MG/ML IJ SOLN
INTRAMUSCULAR | Status: AC
  Filled 2024-01-11: qty 1

## 2024-01-11 MED ORDER — ONDANSETRON HCL 4 MG/2ML IJ SOLN
INTRAMUSCULAR | Status: AC
  Filled 2024-01-11: qty 2

## 2024-01-11 MED ORDER — FENTANYL CITRATE (PF) 100 MCG/2ML IJ SOLN
INTRAMUSCULAR | Status: AC
  Filled 2024-01-11: qty 2

## 2024-01-11 MED ORDER — HYDROCODONE-ACETAMINOPHEN 7.5-325 MG PO TABS
1.0000 | ORAL_TABLET | ORAL | Status: DC | PRN
  Administered 2024-01-11 – 2024-01-13 (×2): 2 via ORAL
  Filled 2024-01-11 (×2): qty 2

## 2024-01-11 MED ORDER — ALUM & MAG HYDROXIDE-SIMETH 200-200-20 MG/5ML PO SUSP
30.0000 mL | ORAL | Status: DC | PRN
Start: 2024-01-11 — End: 2024-01-15

## 2024-01-11 MED ORDER — FENTANYL CITRATE PF 50 MCG/ML IJ SOSY: 25.0000 ug | PREFILLED_SYRINGE | INTRAMUSCULAR | Status: DC | PRN

## 2024-01-11 MED ORDER — PHENYLEPHRINE 80 MCG/ML (10ML) SYRINGE FOR IV PUSH (FOR BLOOD PRESSURE SUPPORT)
PREFILLED_SYRINGE | INTRAVENOUS | Status: DC | PRN
  Administered 2024-01-11 (×2): 160 ug via INTRAVENOUS

## 2024-01-11 MED ORDER — SODIUM CHLORIDE (PF) 0.9 % IJ SOLN
INTRAMUSCULAR | Status: DC | PRN
  Administered 2024-01-11: 61 mL

## 2024-01-11 MED ORDER — ESMOLOL HCL 100 MG/10ML IV SOLN
INTRAVENOUS | Status: AC
  Filled 2024-01-11: qty 10

## 2024-01-11 MED ORDER — ACETAMINOPHEN 10 MG/ML IV SOLN
INTRAVENOUS | Status: DC | PRN
  Administered 2024-01-11: 1000 mg via INTRAVENOUS

## 2024-01-11 MED ORDER — ACETAMINOPHEN 500 MG PO TABS
500.0000 mg | ORAL_TABLET | Freq: Four times a day (QID) | ORAL | Status: AC
  Administered 2024-01-11 – 2024-01-12 (×4): 500 mg via ORAL
  Filled 2024-01-11 (×4): qty 1

## 2024-01-11 MED ORDER — TRANEXAMIC ACID-NACL 1000-0.7 MG/100ML-% IV SOLN
INTRAVENOUS | Status: AC
  Filled 2024-01-11: qty 100

## 2024-01-11 SURGICAL SUPPLY — 51 items
ACE SHELL 54 4H HIP (Shell) ×1 IMPLANT
ACETAB SHELL 4H 54 F HIP (Shell) ×1 IMPLANT
BAG COUNTER SPONGE SURGICOUNT (BAG) IMPLANT
BAG ZIPLOCK 12X15 (MISCELLANEOUS) IMPLANT
CHLORAPREP W/TINT 26 (MISCELLANEOUS) ×1 IMPLANT
COVER PERINEAL POST (MISCELLANEOUS) ×1 IMPLANT
COVER SURGICAL LIGHT HANDLE (MISCELLANEOUS) ×1 IMPLANT
DERMABOND ADVANCED .7 DNX12 (GAUZE/BANDAGES/DRESSINGS) ×2 IMPLANT
DRAPE IMP U-DRAPE 54X76 (DRAPES) ×1 IMPLANT
DRAPE SHEET LG 3/4 BI-LAMINATE (DRAPES) ×3 IMPLANT
DRAPE STERI IOBAN 125X83 (DRAPES) ×1 IMPLANT
DRAPE U-SHAPE 47X51 STRL (DRAPES) ×1 IMPLANT
DRSG AQUACEL AG ADV 3.5X10 (GAUZE/BANDAGES/DRESSINGS) ×1 IMPLANT
ELECT REM PT RETURN 15FT ADLT (MISCELLANEOUS) ×1 IMPLANT
GAUZE SPONGE 4X4 12PLY STRL (GAUZE/BANDAGES/DRESSINGS) ×1 IMPLANT
GLOVE BIO SURGEON STRL SZ7 (GLOVE) ×1 IMPLANT
GLOVE BIO SURGEON STRL SZ8.5 (GLOVE) ×2 IMPLANT
GLOVE BIOGEL PI IND STRL 7.5 (GLOVE) ×1 IMPLANT
GLOVE BIOGEL PI IND STRL 8.5 (GLOVE) ×1 IMPLANT
GOWN SPEC L3 XXLG W/TWL (GOWN DISPOSABLE) ×1 IMPLANT
GOWN STRL REUS W/ TWL XL LVL3 (GOWN DISPOSABLE) ×1 IMPLANT
HEAD CERAMIC BIOLOX 36MM (Head) IMPLANT
HIP SLEEVE BIOLOX -6MM OFFSET (Sleeve) ×1 IMPLANT
HOLDER FOLEY CATH W/STRAP (MISCELLANEOUS) ×1 IMPLANT
HOOD PEEL AWAY T7 (MISCELLANEOUS) ×3 IMPLANT
KIT TURNOVER KIT A (KITS) IMPLANT
LINER ACETAB NN G7 F 36 (Liner) IMPLANT
MANIFOLD NEPTUNE II (INSTRUMENTS) ×1 IMPLANT
MARKER SKIN DUAL TIP RULER LAB (MISCELLANEOUS) ×1 IMPLANT
NDL SAFETY ECLIPSE 18X1.5 (NEEDLE) ×1 IMPLANT
NDL SPNL 18GX3.5 QUINCKE PK (NEEDLE) ×1 IMPLANT
NEEDLE SPNL 18GX3.5 QUINCKE PK (NEEDLE) ×1 IMPLANT
PACK ANTERIOR HIP CUSTOM (KITS) ×1 IMPLANT
SAW OSC TIP CART 19.5X105X1.3 (SAW) ×1 IMPLANT
SEALER BIPOLAR AQUA 6.0 (INSTRUMENTS) ×1 IMPLANT
SET HNDPC FAN SPRY TIP SCT (DISPOSABLE) ×1 IMPLANT
SHELL ACETAB 54 4H HIP (Shell) IMPLANT
SLEEVE HIP BIOLOX -6MM OFFSET (Sleeve) IMPLANT
SOLUTION PRONTOSAN WOUND 350ML (IRRIGATION / IRRIGATOR) ×1 IMPLANT
SPIKE FLUID TRANSFER (MISCELLANEOUS) ×1 IMPLANT
STEM FEM CMTLS 18X156 133D (Stem) IMPLANT
SUT MNCRL AB 3-0 PS2 18 (SUTURE) ×1 IMPLANT
SUT MON AB 2-0 CT1 36 (SUTURE) ×1 IMPLANT
SUT STRATAFIX 14 PDO 48 VLT (SUTURE) ×1 IMPLANT
SUT STRATAFIX PDO 1 14 VIOLET (SUTURE) ×1 IMPLANT
SUT VIC AB 2-0 CT1 TAPERPNT 27 (SUTURE) IMPLANT
SYR 3ML LL SCALE MARK (SYRINGE) ×1 IMPLANT
TOWEL GREEN STERILE FF (TOWEL DISPOSABLE) ×1 IMPLANT
TRAY FOLEY MTR SLVR 16FR STAT (SET/KITS/TRAYS/PACK) IMPLANT
TUBE SUCTION HIGH CAP CLEAR NV (SUCTIONS) ×1 IMPLANT
WATER STERILE IRR 1000ML POUR (IV SOLUTION) ×1 IMPLANT

## 2024-01-11 NOTE — Progress Notes (Signed)
  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  Admitted for left hip fracture.  Consultants Orthopedics   Procedures/Events 2/2 Left total hip arthroplasty, anterior approach.   Assessment and Plan: Left hip fracture after mechanical fall  Status post operative intervention.  Management per orthopedics.  Postoperative hypotension Asymptomatic.  Will bolus fluids and monitor.  Nonischemic cardiomyopathy  Last EF measured was 25 to 30% on 12//24.  Cardiomyopathy likely related to alcoholism.   Hold Toprol-XL, Entresto, spironolactone given hypotension.  Resume Jardiance at discharge.  EtOH abuse on CIWA protocol.  Thiamine.  Chronic hyponatremia  Probably secondary to alcohol use, cardiomyopathy sodium stable.  Follow-up as an outpatient.      Subjective:  Feels ok s/p surgery  BP soft per RN  Physical Exam: Vitals:   01/11/24 1426 01/11/24 1454 01/11/24 1603 01/11/24 1718  BP: (!) 88/68 92/75 (!) 80/64 (!) 83/67  Pulse: 90 92 97 (!) 105  Resp: 14 18    Temp: (!) 97.5 F (36.4 C)     TempSrc:      SpO2: 100% 100%     Physical Exam Vitals reviewed.  Constitutional:      General: He is not in acute distress.    Appearance: He is not ill-appearing or toxic-appearing.     Comments: Appears well  Cardiovascular:     Rate and Rhythm: Normal rate and regular rhythm.     Heart sounds: No murmur heard. Pulmonary:     Effort: Pulmonary effort is normal. No respiratory distress.     Breath sounds: No wheezing, rhonchi or rales.  Neurological:     Mental Status: He is alert.     Data Reviewed: Na+ 127, stable CBC WNL  Family Communication: none  Disposition: Status is:  Inpatient Remains inpatient appropriate because: hip fracture     Time spent: 25 minutes  Author: Brendia Sacks, MD 01/11/2024 5:28 PM  For on call review [email or web address].

## 2024-01-11 NOTE — Anesthesia Procedure Notes (Signed)
Procedure Name: Intubation Date/Time: 01/11/2024 10:00 AM  Performed by: Theodosia Quay, CRNAPre-anesthesia Checklist: Patient identified, Emergency Drugs available, Suction available, Patient being monitored and Timeout performed Patient Re-evaluated:Patient Re-evaluated prior to induction Oxygen Delivery Method: Circle system utilized Preoxygenation: Pre-oxygenation with 100% oxygen Induction Type: IV induction Ventilation: Mask ventilation without difficulty Laryngoscope Size: Mac and 4 Grade View: Grade II Tube type: Oral Tube size: 7.5 mm Number of attempts: 1 Airway Equipment and Method: Stylet Placement Confirmation: ETT inserted through vocal cords under direct vision, positive ETCO2, CO2 detector and breath sounds checked- equal and bilateral Secured at: 22 cm Tube secured with: Tape Dental Injury: Teeth and Oropharynx as per pre-operative assessment  Comments: ATOI

## 2024-01-11 NOTE — Discharge Instructions (Signed)
 ? ?Dr. Arlys John Swinteck ?Joint Replacement Specialist ?[facility] ?[address] ?[place], [place] [address] ?[phone] ? ? ?TOTAL HIP REPLACEMENT POSTOPERATIVE DIRECTIONS ? ? ? ?Hip Rehabilitation, Guidelines Following Surgery  ? ?WEIGHT BEARING ?Weight bearing as tolerated with assist device (walker, cane, etc) as directed, use it as long as suggested by your surgeon or therapist, typically at least 4-6 weeks. ? ?The results of a hip operation are greatly improved after range of motion and muscle strengthening exercises. Follow all safety measures which are given to protect your hip. If any of these exercises cause increased pain or swelling in your joint, decrease the amount until you are comfortable again. Then slowly increase the exercises. Call your caregiver if you have problems or questions.  ? ?HOME CARE INSTRUCTIONS  ?Most of the following instructions are designed to prevent the dislocation of your new hip.  ?Remove items at home which could result in a fall. This includes throw rugs or furniture in walking pathways.  ?Continue medications as instructed at time of discharge. ?You may have some home medications which will be placed on hold until you complete the course of blood thinner medication. ?You may start showering once you are discharged home. Do not remove your dressing. ?Do not put on socks or shoes without following the instructions of your caregivers.   ?Sit on chairs with arms. Use the chair arms to help push yourself up when arising.  ?Arrange for the use of a toilet seat elevator so you are not sitting low.  ?Walk with walker as instructed.  ?You may resume a sexual relationship in one month or when given the OK by your caregiver.  ?Use walker as long as suggested by your caregivers.  ?You may put full weight on your legs and walk as much as is comfortable. ?Avoid periods of inactivity such as sitting longer than an hour when not asleep. This helps prevent blood  clots.  ?You may return to work once you are cleared by Designer, industrial/product.  ?Do not drive a car for 6 weeks or until released by your surgeon.  ?Do not drive while taking narcotics.  ?Wear elastic stockings for two weeks following surgery during the day but you may remove then at night.  ?Make sure you keep all of your appointments after your operation with all of your doctors and caregivers. You should call the office at the above phone number and make an appointment for approximately two weeks after the date of your surgery. ?Please pick up a stool softener and laxative for home use as long as you are requiring pain medications. ?ICE to the affected hip every three hours for 30 minutes at a time and then as needed for pain and swelling. Continue to use ice on the hip for pain and swelling from surgery. You may notice swelling that will progress down to the foot and ankle.  This is normal after surgery.  Elevate the leg when you are not up walking on it.   ?It is important for you to complete the blood thinner medication as prescribed by your doctor. ?Continue to use the breathing machine which will help keep your temperature down.  It is common for your temperature to cycle up and down following surgery, especially at night when you are not up moving around and exerting yourself.  The breathing machine keeps your lungs expanded and your temperature down. ? ?RANGE OF MOTION AND STRENGTHENING EXERCISES  ?These exercises are designed to help you  keep full movement of your hip joint. Follow your caregiver's or physical therapist's instructions. Perform all exercises about fifteen times, three times per day or as directed. Exercise both hips, even if you have had only one joint replacement. These exercises can be done on a training (exercise) mat, on the floor, on a table or on a bed. Use whatever works the best and is most comfortable for you. Use music or television while you are exercising so that the exercises are a  pleasant break in your day. This will make your life better with the exercises acting as a break in routine you can look forward to.  ?Lying on your back, slowly slide your foot toward your buttocks, raising your knee up off the floor. Then slowly slide your foot back down until your leg is straight again.  ?Lying on your back spread your legs as far apart as you can without causing discomfort.  ?Lying on your side, raise your upper leg and foot straight up from the floor as far as is comfortable. Slowly lower the leg and repeat.  ?Lying on your back, tighten up the muscle in the front of your thigh (quadriceps muscles). You can do this by keeping your leg straight and trying to raise your heel off the floor. This helps strengthen the largest muscle supporting your knee.  ?Lying on your back, tighten up the muscles of your buttocks both with the legs straight and with the knee bent at a comfortable angle while keeping your heel on the floor.  ? ?SKILLED REHAB INSTRUCTIONS: ?If the patient is transferred to a skilled rehab facility following release from the hospital, a list of the current medications will be sent to the facility for the patient to continue.  When discharged from the skilled rehab facility, please have the facility set up the patient's Home Health Physical Therapy prior to being released. Also, the skilled facility will be responsible for providing the patient with their medications at time of release from the facility to include their pain medication and their blood thinner medication. If the patient is still at the rehab facility at time of the two week follow up appointment, the skilled rehab facility will also need to assist the patient in arranging follow up appointment in our office and any transportation needs. ? ?POST-OPERATIVE OPIOID TAPER INSTRUCTIONS: ?It is important to wean off of your opioid medication as soon as possible. If you do not need pain medication after your surgery it is ok  to stop day one. ?Opioids include: ?Codeine, Hydrocodone(Norco, Vicodin), Oxycodone(Percocet, oxycontin) and hydromorphone amongst others.  ?Long term and even short term use of opiods can cause: ?Increased pain response ?Dependence ?Constipation ?Depression ?Respiratory depression ?And more.  ?Withdrawal symptoms can include ?Flu like symptoms ?Nausea, vomiting ?And more ?Techniques to manage these symptoms ?Hydrate well ?Eat regular healthy meals ?Stay active ?Use relaxation techniques(deep breathing, meditating, yoga) ?Do Not substitute Alcohol to help with tapering ?If you have been on opioids for less than two weeks and do not have pain than it is ok to stop all together.  ?Plan to wean off of opioids ?This plan should start within one week post op of your joint replacement. ?Maintain the same interval or time between taking each dose and first decrease the dose.  ?Cut the total daily intake of opioids by one tablet each day ?Next start to increase the time between doses. ?The last dose that should be eliminated is the evening dose.  ? ? ?MAKE  SURE YOU:  ?Understand these instructions.  ?Will watch your condition.  ?Will get help right away if you are not doing well or get worse. ? ?Pick up stool softner and laxative for home use following surgery while on pain medications. ?Do not remove your dressing. ?The dressing is waterproof--it is OK to take showers. ?Continue to use ice for pain and swelling after surgery. ?Do not use any lotions or creams on the incision until instructed by your surgeon. ?Total Hip Protocol. ? ?

## 2024-01-11 NOTE — Anesthesia Preprocedure Evaluation (Addendum)
Anesthesia Evaluation  Patient identified by MRN, date of birth, ID band Patient awake    Reviewed: Allergy & Precautions, NPO status , Patient's Chart, lab work & pertinent test results, reviewed documented beta blocker date and time   History of Anesthesia Complications Negative for: history of anesthetic complications  Airway Mallampati: II  TM Distance: >3 FB Neck ROM: Full    Dental  (+) Dental Advisory Given, Edentulous Upper, Poor Dentition, Chipped   Pulmonary neg pulmonary ROS   Pulmonary exam normal        Cardiovascular hypertension, Pt. on medications and Pt. on home beta blockers +CHF  Normal cardiovascular exam+ Valvular Problems/Murmurs    LBBB  '24 TTE - EF 25 to 30%. Global hypokinesis. Right ventricular systolic function is moderately reduced. The right ventricular size is moderately enlarged. There is mildly elevated pulmonary artery systolic pressure. The estimated right ventricular systolic pressure is 39.7 mmHg. Left atrial size was moderately dilated. Right atrial size was severely dilated. Mild mitral valve regurgitation. Tricuspid valve regurgitation is moderate.     Neuro/Psych negative neurological ROS  negative psych ROS   GI/Hepatic ,GERD  Controlled,,(+)     substance abuse  alcohol use  Endo/Other   Na 127 (chronic)    Renal/GU negative Renal ROS    Prostate cancer     Musculoskeletal  (+) Arthritis ,    Abdominal   Peds  Hematology negative hematology ROS (+)   Anesthesia Other Findings   Reproductive/Obstetrics                             Anesthesia Physical Anesthesia Plan  ASA: 4  Anesthesia Plan: General   Post-op Pain Management:    Induction: Intravenous  PONV Risk Score and Plan: 2 and Treatment may vary due to age or medical condition, Ondansetron and Dexamethasone  Airway Management Planned: Oral ETT  Additional Equipment:  None  Intra-op Plan:   Post-operative Plan: Extubation in OR  Informed Consent: I have reviewed the patients History and Physical, chart, labs and discussed the procedure including the risks, benefits and alternatives for the proposed anesthesia with the patient or authorized representative who has indicated his/her understanding and acceptance.     Dental advisory given  Plan Discussed with: CRNA and Anesthesiologist  Anesthesia Plan Comments:         Anesthesia Quick Evaluation

## 2024-01-11 NOTE — Anesthesia Postprocedure Evaluation (Signed)
Anesthesia Post Note  Patient: Austin [name]  Procedure(s) Performed: TOTAL HIP ARTHROPLASTY ANTERIOR APPROACH (Left: Hip)     Patient location during evaluation: PACU Anesthesia Type: General Level of consciousness: awake and alert Pain management: pain level controlled Vital Signs Assessment: post-procedure vital signs reviewed and stable Respiratory status: spontaneous breathing, nonlabored ventilation, respiratory function stable and patient connected to nasal cannula oxygen Cardiovascular status: blood pressure returned to baseline and stable Postop Assessment: no apparent nausea or vomiting Anesthetic complications: no   No notable events documented.  Last Vitals:  Vitals:   01/11/24 1245 01/11/24 1300  BP: 96/74 93/74  Pulse: (!) 104 98  Resp: 16 13  Temp:    SpO2: 100% 100%    Last Pain:  Vitals:   01/11/24 1300  TempSrc:   PainSc: 0-No pain                 Beryle Lathe

## 2024-01-11 NOTE — Transfer of Care (Signed)
Immediate Anesthesia Transfer of Care Note  Patient: Austin [name]  Procedure(s) Performed: Procedure(s): TOTAL HIP ARTHROPLASTY ANTERIOR APPROACH (Left)  Patient Location: PACU  Anesthesia Type:General  Level of Consciousness:  sedated, patient cooperative and responds to stimulation  Airway & Oxygen Therapy:Patient Spontanous Breathing and Patient connected to face mask oxgen  Post-op Assessment:  Report given to PACU RN and Post -op Vital signs reviewed and stable  Post vital signs:  Reviewed and stable  Last Vitals:  Vitals:   01/11/24 0713 01/11/24 0900  BP:  135/81  Pulse:  94  Resp:  16  Temp: 37.1 C 37 C  SpO2:  99%    Complications: No apparent anesthesia complications

## 2024-01-11 NOTE — Op Note (Signed)
OPERATIVE REPORT  SURGEON: Samson Frederic, MD   ASSISTANT: Clint Bolder, PA-C  PREOPERATIVE DIAGNOSIS: Displaced Left femoral neck fracture.   POSTOPERATIVE DIAGNOSIS: Displaced Left femoral neck fracture.   PROCEDURE: Left total hip arthroplasty, anterior approach.   IMPLANTS: Biomet Taperloc Reduced Distal stem, size 18 x 156 mm, high offset. Biomet G7 OsseoTi Cup, size 54 mm. Biomet Vivacit-E liner, size 36 mm, F, neutral. Biomet Biolox ceramic head ball, size 36 - 6 mm.  ANESTHESIA:  General  ANTIBIOTICS: 2g ancef.  ESTIMATED BLOOD LOSS:-200 mL    DRAINS: None.  COMPLICATIONS: None   CONDITION: PACU - hemodynamically stable.   BRIEF CLINICAL NOTE: Austin [name] is a 68 y.o. male with a displaced Left femoral neck fracture. The patient was admitted to the hospitalist service and underwent perioperative risk stratification and medical optimization. The risks, benefits, and alternatives to total hip arthroplasty were explained, and the patient elected to proceed.  PROCEDURE IN DETAIL: The patient was taken to the operating room and general anesthesia was induced on the hospital bed.  The patient was then positioned on the Hana table.  All bony prominences were well padded.  The hip was prepped and draped in the normal sterile surgical fashion.  A time-out was called verifying side and site of surgery. Antibiotics were given within 60 minutes of beginning the procedure.   Bikini incision was made, and the direct anterior approach to the hip was performed through the Hueter interval.  Lateral femoral circumflex vessels were treated with the Auqumantys. The anterior capsule was exposed and an inverted T capsulotomy was made.  Fracture hematoma was encountered and evacuated. The patient was found to have a comminuted Left subcapital femoral neck fracture.  I freshened the femoral neck cut with a saw.  I removed the femoral neck fragment.  A corkscrew was placed into the head and the  head was removed.  This was passed to the back table and was measured. The pubofemoral ligament was released subperiosteally to the lesser trochanter.  Acetabular exposure was achieved, and the pulvinar and labrum were excised. Sequential reaming of the acetabulum was then performed up to a size 53 mm reamer under direct visulization. A 54 mm cup was then opened and impacted into place at approximately 40 degrees of abduction and 20 degrees of anteversion. The final polyethylene liner was impacted into place and acetabular osteophytes were removed.    I then gained femoral exposure taking care to protect the abductors and greater trochanter.  This was performed using standard external rotation, extension, and adduction.  A cookie cutter was used to enter the femoral canal, and then the femoral canal finder was placed.  Sequential broaching was performed up to a size 18.  Calcar planer was used on the femoral neck remnant.  I placed a high offset neck and a trial head ball.  The hip was reduced.  Leg lengths and offset were checked fluoroscopically.  The hip was dislocated and trial components were removed.  The final implants were placed, and the hip was reduced.  Fluoroscopy was used to confirm component position and leg lengths.  At 90 degrees of external rotation and full extension, the hip was stable to an anterior directed force.   The wound was copiously irrigated with Prontosan solution and normal saline using pulse lavage.  Marcaine solution was injected into the periarticular soft tissue.  The wound was closed in layers using #1 Stratafix for the fascia, 2-0 Vicryl for the subcutaneous fat, 2-0  Monocryl for the deep dermal layer, 3-0 running Monocryl subcuticular stitch, and Dermabond for the skin.  Once the glue was fully dried, an Aquacell Ag dressing was applied.  The patient was transported to the recovery room in stable condition.  Sponge, needle, and instrument counts were correct at the end of  the case x2.  The patient tolerated the procedure well and there were no known complications.  The aquamantis was utilized for this case to help facilitate better hemostasis as patient was felt to be at increased risk of bleeding because of history of coagulopathy.  A oscillating saw tip was utilized for this case to prevent damage to the soft tissue structures such as muscles, ligaments and tendons, and to ensure accurate bone cuts. This patient was at increased risk for above structures due to  minimally invasive approach.  Please note that a surgical assistant was a medical necessity for this procedure to perform it in a safe and expeditious manner. Assistant was necessary to provide appropriate retraction of vital neurovascular structures, to prevent femoral fracture, and to allow for anatomic placement of the prosthesis.

## 2024-01-11 NOTE — Progress Notes (Signed)
       Overnight   NAME: Austin [name] MRN: [id number] DOB : [birth year]    Date of Service   01/11/2024   HPI/Events of Note    Notified by RN for hypotension on bedside vitals.  Patient admitted through ER for left hip fracture Most recent echo shows EF 25 to 30%.  Call for blood pressure systolic in the 60s Bedside visit Patient awake and oriented and readily conversational Reassessed cuff size, smaller cuff diameter indicated due to patient's arm size.  Repeat blood pressures 85-90 systolic Patient is receiving fluid from prior order Repeat fluid bolus ordered from attendings prior bolus.  Patient is in no obvious or stated distress. Patient states that he is drinking fluids and has made some small amounts of urine.  Radial pulse is strong (indicative of BP estimate 85-95)    Interventions/ Plan   Continue all previous Attending orders Continue fluid bolus Continue use of smaller light blue cuff.      Chinita Greenland BSN MSNA MSN ACNPC-AG Acute Care Nurse Practitioner [facility]

## 2024-01-11 NOTE — Hospital Course (Signed)
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  Admitted for left hip fracture.  Consultants Orthopedics   Procedures/Events 2/2 Left total hip arthroplasty, anterior approach.

## 2024-01-11 NOTE — Consult Note (Signed)
ORTHOPAEDIC CONSULTATION  REQUESTING PHYSICIAN: Standley Brooking, MD  PCP:  [name], FNP  Chief Complaint: Left hip injury  HPI: Austin [name] is a 68 y.o. male with a past medical history of nonischemic cardiomyopathy, chronic hyponatremia, and alcohol abuse who sustained a ground-level fall at home yesterday.  He had immediate left hip pain and inability to weight-bear.  He was brought to the emergency department at [facility], where x-rays revealed a comminuted, displaced left femoral neck fracture.  He was admitted by [facility] for perioperative risk stratification and medical optimization.  Orthopedic consultation was placed for management of his left hip fracture.  Past Medical History:  Diagnosis Date   Arthritis    hands   CHF (congestive heart failure) (HCC)    GERD (gastroesophageal reflux disease)    Hypertension    Hypomagnesemia    Hyponatremia    Prostate cancer [facility])    Past Surgical History:  Procedure Laterality Date   LYMPHADENECTOMY Bilateral 01/26/2014   Procedure: LYMPHADENECTOMY WITH INDOCYANINE GREEN DYE;  Surgeon: Sebastian Ache, MD;  Location: [facility] ORS;  Service: Urology;  Laterality: Bilateral;   RIGHT/LEFT HEART CATH AND CORONARY ANGIOGRAPHY N/A 02/25/2020   Procedure: RIGHT/LEFT HEART CATH AND CORONARY ANGIOGRAPHY;  Surgeon: Kathleene Hazel, MD;  Location: [facility] INVASIVE CV LAB;  Service: Cardiovascular;  Laterality: N/A;   ROBOT ASSISTED LAPAROSCOPIC RADICAL PROSTATECTOMY N/A 01/26/2014   Procedure: ROBOTIC ASSISTED LAPAROSCOPIC RADICAL PROSTATECTOMY;  Surgeon: Sebastian Ache, MD;  Location: [facility] ORS;  Service: Urology;  Laterality: N/A;   SHOULDER SURGERY Left    TEE WITHOUT CARDIOVERSION N/A 04/15/2022   Procedure: TRANSESOPHAGEAL ECHOCARDIOGRAM (TEE);  Surgeon: Laurey Morale, MD;  Location: [facility] ENDOSCOPY;  Service: Cardiovascular;  Laterality: N/A;   Social History   Socioeconomic History   Marital status: Married    Spouse  name: Not on file   Number of children: 2   Years of education: Not on file   Highest education level: Bachelor's degree (e.g., BA, AB, BS)  Occupational History   Not on file  Tobacco Use   Smoking status: Never   Smokeless tobacco: Never  Vaping Use   Vaping status: Never Used  Substance and Sexual Activity   Alcohol use: Yes    Comment: 1-3 beers daily   Drug use: No   Sexual activity: Yes  Other Topics Concern   Not on file  Social History Narrative   Not on file   Social Drivers of Health   Financial Resource Strain: Low Risk  (02/27/2022)   Overall Financial Resource Strain (CARDIA)    Difficulty of Paying Living Expenses: Not hard at all  Food Insecurity: No Food Insecurity (05/21/2023)   Hunger Vital Sign    Worried About Running Out of Food in the Last Year: Never true    Ran Out of Food in the Last Year: Never true  Transportation Needs: No Transportation Needs (06/18/2023)   PRAPARE - Administrator, Civil Service (Medical): No    Lack of Transportation (Non-Medical): No  Physical Activity: Inactive (02/27/2022)   Exercise Vital Sign    Days of Exercise per Week: 0 days    Minutes of Exercise per Session: 0 min  Stress: No Stress Concern Present (02/27/2022)   [employer] of Occupational Health - Occupational Stress Questionnaire    Feeling of Stress : Only a little  Social Connections: Socially Integrated (12/06/2020)   Social Connection and Isolation Panel [NHANES]    Frequency  of Communication with Friends and Family: More than three times a week    Frequency of Social Gatherings with Friends and Family: More than three times a week    Attends Religious Services: More than 4 times per year    Active Member of [employer] or Organizations: Yes    Attends Engineer, structural: More than 4 times per year    Marital Status: Married   Family History  Problem Relation Age of Onset   Hypertension Mother    Hypertension Father    Allergies   Allergen Reactions   Latex Swelling and Rash   Prior to Admission medications   Medication Sig Start Date End Date Taking? Authorizing Provider  empagliflozin (JARDIANCE) 10 MG TABS tablet Take 1 tablet (10 mg total) by mouth daily before breakfast. 09/17/23  Yes Laurey Morale, MD  furosemide (LASIX) 40 MG tablet Take 1 tablet (40 mg total) by mouth daily. 01/07/24  Yes Lee, [place], NP  loperamide (IMODIUM) 2 MG capsule Take 2 mg by mouth every 6 (six) hours as needed for diarrhea or loose stools. 10/28/23  Yes [provider]  Multiple Vitamin (MULTIVITAMIN) tablet Take 1 tablet by mouth daily.   Yes [provider]  sacubitril-valsartan (ENTRESTO) 49-51 MG Take 1 tablet by mouth 2 (two) times daily. 06/25/23  Yes Clegg, Amy D, NP  metoprolol succinate (TOPROL-XL) 100 MG 24 hr tablet Take 1 tablet (100 mg total) by mouth daily. Take with or immediately following a meal. 12/23/23   Lee, [place], NP  spironolactone (ALDACTONE) 25 MG tablet Take 1 tablet (25 mg total) by mouth daily. 09/30/23   Laurey Morale, MD   DG HIP UNILAT WITH PELVIS 2-3 VIEWS LEFT Result Date: 01/10/2024 CLINICAL DATA:  Fall yesterday, pain.  Unable to bear weight. EXAM: DG HIP (WITH OR WITHOUT PELVIS) 2-3V LEFT COMPARISON:  None. FINDINGS: Mildly comminuted left femoral neck fracture with some superior displacement of the distal fracture fragment. No dislocation. No additional evidence of an acute fracture. IMPRESSION: Mildly comminuted left femoral neck fracture. Electronically Signed   By: Leanna Battles M.D.   On: 01/10/2024 15:03    Positive ROS: All other systems have been reviewed and were otherwise negative with the exception of those mentioned in the HPI and as above.  Physical Exam: General: Alert, no acute distress Cardiovascular: No pedal edema Respiratory: No cyanosis, no use of accessory musculature GI: No organomegaly, abdomen is soft and non-tender Skin: No lesions in the area of  chief complaint Neurologic: Sensation intact distally Psychiatric: Patient is competent for consent with normal mood and affect Lymphatic: No axillary or cervical lymphadenopathy  MUSCULOSKELETAL: Examination of the left hip girdle reveals a couple small areas of eschar.  The skin over the surgical site is intact without any wounds or lesions.  He is shortened and externally rotated.  He has severe left hip pain with attempted logrolling.  He has 1+ DP pulse.  Positive motor function dorsiflexion, plantarflexion, and great toe extension.  He reports intact sensation to light touch.  Assessment: Displaced left femoral neck fracture. Alcohol abuse.  Plan: I discussed the findings with the patient and his wife.  He has a displaced left femoral neck fracture, which requires surgical intervention.  I recommend left total hip arthroplasty for pain control and immediate mobilization out of bed.  We discussed the risk, benefits, and alternatives.  Plan for surgery today.  Continue NPO.  Hold chemical DVT prophylaxis for now.  All questions  were solicited and answered.  The risks, benefits, and alternatives were discussed with the patient. There are risks associated with the surgery including, but not limited to, problems with anesthesia (death), infection, instability (giving out of the joint), dislocation, differences in leg length/angulation/rotation, fracture of bones, loosening or failure of implants, hematoma (blood accumulation) which may require surgical drainage, blood clots, pulmonary embolism, nerve injury (foot drop and lateral thigh numbness), and blood vessel injury. The patient understands these risks and elects to proceed.   Jonette Pesa, MD [phone]    01/11/2024 8:55 AM

## 2024-01-12 ENCOUNTER — Encounter (HOSPITAL_COMMUNITY): Payer: Self-pay | Admitting: Orthopedic Surgery

## 2024-01-12 DIAGNOSIS — E871 Hypo-osmolality and hyponatremia: Secondary | ICD-10-CM | POA: Diagnosis not present

## 2024-01-12 DIAGNOSIS — I428 Other cardiomyopathies: Secondary | ICD-10-CM | POA: Diagnosis not present

## 2024-01-12 DIAGNOSIS — S72002A Fracture of unspecified part of neck of left femur, initial encounter for closed fracture: Secondary | ICD-10-CM | POA: Diagnosis not present

## 2024-01-12 DIAGNOSIS — E2740 Unspecified adrenocortical insufficiency: Secondary | ICD-10-CM | POA: Insufficient documentation

## 2024-01-12 LAB — BASIC METABOLIC PANEL
Anion gap: 9 (ref 5–15)
BUN: 22 mg/dL (ref 8–23)
CO2: 21 mmol/L — ABNORMAL LOW (ref 22–32)
Calcium: 7.9 mg/dL — ABNORMAL LOW (ref 8.9–10.3)
Chloride: 100 mmol/L (ref 98–111)
Creatinine, Ser: 1.18 mg/dL (ref 0.61–1.24)
GFR, Estimated: >60 mL/min (ref >60)
Glucose, Bld: 102 mg/dL — ABNORMAL HIGH (ref 70–99)
Potassium: 3.9 mmol/L (ref 3.5–5.1)
Sodium: 130 mmol/L — ABNORMAL LOW (ref 135–145)

## 2024-01-12 LAB — CBC
HCT: 29.1 % — ABNORMAL LOW (ref 39.0–52.0)
Hemoglobin: 9.7 g/dL — ABNORMAL LOW (ref 13.0–17.0)
MCH: 32.6 pg (ref 26.0–34.0)
MCHC: 33.3 g/dL (ref 30.0–36.0)
MCV: 97.7 fL (ref 80.0–100.0)
Platelets: 145 10*3/uL — ABNORMAL LOW (ref 150–400)
RBC: 2.98 MIL/uL — ABNORMAL LOW (ref 4.22–5.81)
RDW: 19.2 % — ABNORMAL HIGH (ref 11.5–15.5)
WBC: 11.2 10*3/uL — ABNORMAL HIGH (ref 4.0–10.5)
nRBC: 0 % (ref 0.0–0.2)

## 2024-01-12 LAB — CORTISOL-AM, BLOOD: Cortisol - AM: 2.1 ug/dL — ABNORMAL LOW (ref 6.7–22.6)

## 2024-01-12 MED ORDER — HYDROCORTISONE SOD SUC (PF) 100 MG IJ SOLR
50.0000 mg | Freq: Four times a day (QID) | INTRAMUSCULAR | Status: DC
  Administered 2024-01-12 – 2024-01-13 (×5): 50 mg via INTRAVENOUS
  Filled 2024-01-12 (×5): qty 2

## 2024-01-12 MED ORDER — HYDROCORTISONE SOD SUC (PF) 100 MG IJ SOLR
100.0000 mg | Freq: Once | INTRAMUSCULAR | Status: AC
  Administered 2024-01-12: 100 mg via INTRAVENOUS
  Filled 2024-01-12: qty 2

## 2024-01-12 NOTE — Progress Notes (Signed)
    Subjective:  Patient reports pain as mild to moderate.  Denies N/V/CP/SOB/Abd pain. He reports mild thigh pain. He did well with PT. Family at bedside. They are hopeful for d/c home.   All questions solicited and answered.   Objective:   VITALS:   Vitals:   01/12/24 0620 01/12/24 0625 01/12/24 0758 01/12/24 1105  BP: (!) 64/25 (!) 76/45 (!) 76/46 (!) 84/56  Pulse: 83  85 91  Resp: 18     Temp: 98.1 F (36.7 C)  98 F (36.7 C)   TempSrc:   Oral   SpO2: 100%  97% 100%    NAD Neurologically intact ABD soft Neurovascular intact Sensation intact distally Intact pulses distally Dorsiflexion/Plantar flexion intact Incision: dressing C/D/I No cellulitis present Compartment soft   Lab Results  Component Value Date   WBC 11.2 (H) 01/12/2024   HGB 9.7 (L) 01/12/2024   HCT 29.1 (L) 01/12/2024   MCV 97.7 01/12/2024   PLT 145 (L) 01/12/2024   BMET    Component Value Date/Time   NA 130 (L) 01/12/2024 0523   NA 130 (L) 12/18/2022 1215   K 3.9 01/12/2024 0523   CL 100 01/12/2024 0523   CO2 21 (L) 01/12/2024 0523   GLUCOSE 102 (H) 01/12/2024 0523   BUN 22 01/12/2024 0523   BUN 9 12/18/2022 1215   CREATININE 1.18 01/12/2024 0523   CALCIUM 7.9 (L) 01/12/2024 0523   EGFR 63 12/18/2022 1215   GFRNONAA >60 01/12/2024 0523     Assessment/Plan: 1 Day Post-Op   Principal Problem:   Closed left hip fracture (HCC) Active Problems:   ETOH abuse   Essential hypertension   NICM (nonischemic cardiomyopathy) (HCC)   Hyponatremia  ABLA. Hemoglobin 9.7. Continue to monitor.   WBAT with walker DVT ppx: Aspirin, SCDs, TEDS PO pain control PT/OT: Continue to mobilize with PT/OT. Dispo:  -Patient under care of the medical team. Disposition per their recommendation. Patient hopeful for d/c home. Pain medication printed in chart. Aspirin 81mg  BID for DVT ppx x6 weeks.    Clois Dupes, PA-C 01/12/2024, 12:25 PM   [facility]  [facility] [address], [place], [place] [address] Phone: [phone] [email or web address] Facebook  [employer]

## 2024-01-12 NOTE — Plan of Care (Signed)

## 2024-01-12 NOTE — Evaluation (Signed)
Physical Therapy Evaluation Patient Details Name: Austin [name] MRN: [id number] DOB: 02-14-1956 Today's Date: 01/12/2024  History of Present Illness  68 yo male admitted with L hip fx after falling at home. S/P L THA-DA. Post op hypotension. Hx of ETOH abuse, chronic hyponatremia  Clinical Impression  On eval, pt required Min A for mobility. He walked ~120 feet with a RW. Pain rated 9/10 during session. Will follow and progress activity as tolerated. Recommend HHPT f/u. Wife states she has a RW, 3n1 that pt can use at home.         If plan is discharge home, recommend the following: A little help with walking and/or transfers;A little help with bathing/dressing/bathroom;Assistance with cooking/housework;Assist for transportation;Help with stairs or ramp for entrance   Can travel by private vehicle        Equipment Recommendations None recommended by PT  Recommendations for Other Services       Functional Status Assessment Patient has had a recent decline in their functional status and demonstrates the ability to make significant improvements in function in a reasonable and predictable amount of time.     Precautions / Restrictions Precautions Precautions: Fall Restrictions Weight Bearing Restrictions Per Provider Order: No Other Position/Activity Restrictions: WBAT      Mobility  Bed Mobility Overal bed mobility: Needs Assistance Bed Mobility: Supine to Sit     Supine to sit: Contact guard, HOB elevated     General bed mobility comments: Pt used gait belt to assist L LE off bed. Cues provided. Increased time.    Transfers Overall transfer level: Needs assistance Equipment used: Rolling walker (2 wheels) Transfers: Sit to/from Stand Sit to Stand: From elevated surface, Min assist           General transfer comment: Cues for safety, technique, hand placement. Assist to rise, steady, control descent.    Ambulation/Gait Ambulation/Gait assistance: Min  assist Gait Distance (Feet): 120 Feet Assistive device: Rolling walker (2 wheels) Gait Pattern/deviations: Decreased stride length, Step-through pattern       General Gait Details: Cues for safety, sequencing, RW proximity. Progressed to step through pattern. Assist to stabilize throughout distance. Tolerated distance well.  Stairs            Wheelchair Mobility     Tilt Bed    Modified Rankin (Stroke Patients Only)       Balance Overall balance assessment: Needs assistance, History of Falls         Standing balance support: Bilateral upper extremity supported, During functional activity, Reliant on assistive device for balance Standing balance-Leahy Scale: Poor                               Pertinent Vitals/Pain Pain Assessment Pain Assessment: 0-10 Pain Score: 9  Pain Location: L hip Pain Intervention(s): Limited activity within patient's tolerance, Monitored during session, Repositioned    Home Living Family/patient expects to be discharged to:: Private residence Living Arrangements: Spouse/significant other Available Help at Discharge: Family Type of Home: House Home Access: Stairs to enter   Secretary/administrator of Steps: 1   Home Layout: One level Home Equipment: None Additional Comments: wife has RW, BSC, cane that pt can use    Prior Function Prior Level of Function : Independent/Modified Independent                     Extremity/Trunk Assessment   Upper Extremity Assessment Upper Extremity  Assessment: Defer to OT evaluation    Lower Extremity Assessment Lower Extremity Assessment: Generalized weakness    Cervical / Trunk Assessment Cervical / Trunk Assessment: Normal  Communication   Communication Communication: No apparent difficulties  Cognition Arousal: Alert Behavior During Therapy: WFL for tasks assessed/performed Overall Cognitive Status: Within Functional Limits for tasks assessed                                           General Comments      Exercises Total Joint Exercises Ankle Circles/Pumps: AROM, 10 reps, Left Quad Sets: AROM, 10 reps, Left Heel Slides: AAROM, Left, 10 reps   Assessment/Plan    PT Assessment Patient needs continued PT services  PT Problem List Decreased strength;Decreased range of motion;Decreased activity tolerance;Decreased mobility;Decreased balance;Decreased knowledge of use of DME;Pain       PT Treatment Interventions DME instruction;Gait training;Stair training;Functional mobility training;Therapeutic activities;Therapeutic exercise;Balance training;Patient/family education    PT Goals (Current goals can be found in the Care Plan section)  Acute Rehab PT Goals Patient Stated Goal: regain PLOF/independence. less pain PT Goal Formulation: With patient/family Time For Goal Achievement: 01/26/24 Potential to Achieve Goals: Good    Frequency Min 1X/week     Co-evaluation               AM-PAC PT "6 Clicks" Mobility  Outcome Measure Help needed turning from your back to your side while in a flat bed without using bedrails?: A Little Help needed moving from lying on your back to sitting on the side of a flat bed without using bedrails?: A Little Help needed moving to and from a bed to a chair (including a wheelchair)?: A Little Help needed standing up from a chair using your arms (e.g., wheelchair or bedside chair)?: A Little Help needed to walk in hospital room?: A Little Help needed climbing 3-5 steps with a railing? : A Little 6 Click Score: 18    End of Session Equipment Utilized During Treatment: Gait belt Activity Tolerance: Patient tolerated treatment well Patient left: in chair;with call bell/phone within reach;with chair alarm set;with family/visitor present   PT Visit Diagnosis: Other abnormalities of gait and mobility (R26.89);Pain Pain - Right/Left: Left Pain - part of body: Hip    Time: 2130-8657 PT Time  Calculation (min) (ACUTE ONLY): 25 min   Charges:   PT Evaluation $PT Eval Low Complexity: 1 Low PT Treatments $Gait Training: 8-22 mins PT General Charges $$ ACUTE PT VISIT: 1 Visit           Faye Ramsay, PT Acute Rehabilitation  Office: [phone]

## 2024-01-12 NOTE — Progress Notes (Signed)
  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  Admitted for left hip fracture.  Consultants Orthopedics   Procedures/Events 2/2 Left total hip arthroplasty, anterior approach.   Assessment and Plan: Left hip fracture after mechanical fall  Status post operative intervention.  Management per orthopedics.  WBAT with walker DVT ppx: Aspirin, SCDs, TEDS PO pain control PT/OT: Continue to mobilize with PT/OT. Dispo:  Pain medication printed in chart. Aspirin 81mg  BID for DVT ppx x6 weeks.    Postoperative hypotension Acute adrenal insufficiency Asymptomatic.  Cortisol low.  Findings out of proportion to exam. Blood pressure has responded to Solu-Cortef.  Will continue hydrocortisone. Outpatient follow-up.   Nonischemic cardiomyopathy  Last EF measured was 25 to 30% on 12//24.  Cardiomyopathy likely related to alcoholism.   Hold Toprol-XL, Entresto, spironolactone given hypotension.  Resume Jardiance at discharge.   EtOH abuse on CIWA protocol.  Thiamine.   Chronic hyponatremia  Probably secondary to alcohol use, cardiomyopathy sodium stable.  Follow-up as an outpatient.     Subjective:  Hypotensive overnight, discussed with Austin [name].  Patient reports feeling fine, no complaints.  Physical Exam: Vitals:   01/12/24 0758 01/12/24 1105 01/12/24 1431 01/12/24 1735  BP: (!) 76/46 (!) 84/56 (!) 115/45 102/63  Pulse: 85 91 95 96  Resp:   18   Temp: 98 F (36.7 C)  98.2 F (36.8 C)   TempSrc: Oral  Oral   SpO2: 97% 100% 99% 98%   Physical Exam Vitals reviewed.  Constitutional:      General: He is not in acute distress.    Appearance: He is not ill-appearing or toxic-appearing.   Cardiovascular:     Rate and Rhythm: Normal rate and regular rhythm.     Heart sounds: No murmur heard.    Comments: Dorsalis pedis pulses 2+ bilaterally. Pulmonary:     Effort: Pulmonary effort is normal. No respiratory distress.     Breath sounds: No wheezing or rales.  Musculoskeletal:     Right lower leg: No edema.     Left lower leg: No edema.  Neurological:     Mental Status: He is alert.  Psychiatric:        Mood and Affect: Mood normal.        Behavior: Behavior normal.   Late entry, patient examined early this morning.  Data Reviewed: Sodium up to 130.  Remainder BMP unremarkable. Hemoglobin down to 9.7 postoperatively.  WBC 11.2.  Platelets down to 145. Serum cortisol was low this morning.  Family Communication: wife at bedside  Disposition: Status is: Inpatient Remains inpatient appropriate because: s/p surgery     Time spent: 20 minutes  Author: Brendia Sacks, MD 01/12/2024 6:30 PM  For on call review [email or web address].

## 2024-01-12 NOTE — TOC Initial Note (Signed)
Transition of Care [facility]) - Initial/Assessment Note    Patient Details  Name: Austin [name] MRN: [id number] Date of Birth: [birth year]  Transition of Care [facility]) CM/SW Contact:    Otelia Santee, LCSW Phone Number: 01/12/2024, 1:09 PM  Clinical Narrative:                 Pt from home w/ spouse. Met with pt and spouse at bedside to discuss recommendation for HHPT. Pt hesitant about having people come into the home however, consented to [facility] services after speaking to his wife. No preference for HHA noted. HHPT has been arranged with [facility]. HH order will need to be placed prior to discharge. TOC also consulted for counseling and education for alcohol use. Pt shares he drinks two beers a day. Pt and wife upset that substance use is being addressed and report that pt's alcohol use is not an issue. Pt and wife decline resources.   Expected Discharge Plan: Home w Home Health Services Barriers to Discharge: No Barriers Identified   Patient Goals and CMS Choice Patient states their goals for this hospitalization and ongoing recovery are:: To return home CMS Medicare.gov Compare Post Acute Care list provided to:: Patient Choice offered to / list presented to : Patient [place] ownership interest in [facility].provided to::  (NA)    Expected Discharge Plan and Services In-house Referral: Clinical Social Work Discharge Planning Services: NA Post Acute Care Choice: Home Health Living arrangements for the past 2 months: Single Family Home                 DME Arranged: N/A DME Agency: NA       HH Arranged: PT HH AgencyHotel manager Home Health Care Date HH Agency Contacted: 01/12/24 Time HH Agency Contacted: 1308 Representative spoke with at [facility] Agency: Cindie  Prior Living Arrangements/Services Living arrangements for the past 2 months: Single Family Home Lives with:: Spouse Patient language and need for interpreter reviewed:: Yes Do you feel safe going back to the place  where you live?: Yes      Need for Family Participation in Patient Care: No (Comment) Care giver support system in place?: No (comment) Current home services: DME Gilmer Mor, RW) Criminal Activity/Legal Involvement Pertinent to Current Situation/Hospitalization: No - Comment as needed  Activities of Daily Living      Permission Sought/Granted Permission sought to share information with : Family Supports, Oceanographer granted to share information with : Yes, Verbal Permission Granted     Permission granted to share info w AGENCY: HHA        Emotional Assessment Appearance:: Appears stated age Attitude/Demeanor/Rapport: Engaged Affect (typically observed): Apprehensive Orientation: : Oriented to Self, Oriented to Place, Oriented to  Time, Oriented to Situation Alcohol / Substance Use: Alcohol Use Psych Involvement: No (comment)  Admission diagnosis:  Closed left hip fracture (HCC) [S72.002A] Closed fracture of neck of left femur, initial encounter (HCC) [S72.002A] Patient Active Problem List   Diagnosis Date Noted   Closed left hip fracture (HCC) 01/10/2024   Transient hypotension 06/05/2023   Alcohol use disorder 06/05/2023   Hypertensive 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 (HCC) 06/05/2023   Elevated brain natriuretic peptide (BNP) level 06/05/2023   Elevated LFTs 05/18/2023   Prolonged QT interval 05/18/2023   Hyponatremia 05/17/2023   Iron deficiency anemia 09/03/2020   NICM (nonischemic cardiomyopathy) (HCC)    ETOH abuse 11/10/2018   Essential hypertension 11/10/2018   Blood  in stool 11/10/2018   PCP:  [name], FNP Pharmacy:   [facility] [phone] - [place], [place] - [phone] [address] AT [facility] [address] [place] [address] Phone: [phone] Fax: [phone]  [place] [facility] [address] [place] [address] Phone:  [phone] Fax: [phone]     Social Drivers of Health (SDOH) Social History: SDOH Screenings   Food Insecurity: No Food Insecurity (01/11/2024)  Housing: Unknown (01/11/2024)  Transportation Needs: No Transportation Needs (01/11/2024)  Utilities: Not At Risk (01/11/2024)  Alcohol Screen: Low Risk  (12/06/2020)  Depression (PHQ2-9): Low Risk  (05/15/2023)  Financial Resource Strain: Low Risk  (02/27/2022)  Physical Activity: Inactive (02/27/2022)  Social Connections: Socially Integrated (01/11/2024)  Stress: No Stress Concern Present (02/27/2022)  Tobacco Use: Low Risk  (01/11/2024)   SDOH Interventions:     Readmission Risk Interventions    01/12/2024    1:06 PM  Readmission Risk Prevention Plan  Transportation Screening Complete  PCP or Specialist Appt within 5-7 Days Complete  Home Care Screening Complete  Medication Review (RN CM) Complete

## 2024-01-12 NOTE — TOC CM/SW Note (Signed)
 [facility] Quality rating ???? Patient survey rating ????   [facility] Quality rating ????? Patient survey rating???   [facility] [phone] Quality rating???? Patient survey rating????   [facility] [phone] Quality rating???? Patient survey rating????   [facility] [phone] Quality rating ???? Patient survey rating???   [facility] Quality rating??? Patient survey rating???   [facility] [phone] Quality rating??? Patient survey rating ????   [facility] [phone] Quality rating ????   [facility] Quality rating ??? Patient survey rating ????   [facility] Quality rating ??? Patient survey rating???   [facility] Quality rating ????? Patient survey rating ???   [facility] [phone] Quality rating ????? Patient survey rating ????

## 2024-01-12 NOTE — Progress Notes (Signed)
Initial Nutrition Assessment  INTERVENTION:   -Encourage PO intakes  -Liberalized diet to regular to promote good intakes  -Continue CIWA vitamin supplementation  NUTRITION DIAGNOSIS:   Increased nutrient needs related to hip fracture, post-op healing as evidenced by estimated needs.  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  Admitted for left hip fracture.  2/2 s/p Left total hip arthroplasty   Patient in room, wife at bedside. Pt reports good appetite. Ate eggs and sausage this morning for breakfast. Denies any swallowing, chewing or taste issues. Pt would like diet liberalized, so will change to regular. Pt does not want protein shakes but states he has protein bars at home he can supplement with.   Pt's UBW ~146-148 lbs Weight has remained stable.  Medications: Colace, Folic acid, Multivitamin with minerals daily, Senokot, Thiamine  Labs reviewed: Low Na   NUTRITION - FOCUSED PHYSICAL EXAM:  Flowsheet Row Most Recent Value  Orbital Region No depletion  Upper Arm Region No depletion  Thoracic and Lumbar Region No depletion  Buccal Region No depletion  Temple Region No depletion  Clavicle Bone Region No depletion  Clavicle and Acromion Bone Region No depletion  Scapular Bone Region No depletion  Dorsal Hand No depletion  Patellar Region Unable to assess  [reports pain]  Anterior Thigh Region Unable to assess  Posterior Calf Region Unable to assess  Edema (RD Assessment) None  Hair Reviewed  Eyes Reviewed  Mouth Reviewed  Skin Reviewed  [dry]  Nails Reviewed       Diet Order:   Diet Order             Diet regular Room service appropriate? Yes; Fluid consistency:  Thin  Diet effective now                   EDUCATION NEEDS:   Education needs have been addressed  Skin:  Skin Assessment: Skin Integrity Issues: Skin Integrity Issues:: Incisions Incisions: 2/2 left hip  Last BM:  PTA  Height:   Ht Readings from Last 1 Encounters:  12/23/23 5\' 9"  (1.753 m)    Weight:   Wt Readings from Last 1 Encounters:  01/07/24 66.2 kg    BMI:  There is no height or weight on file to calculate BMI.  Estimated Nutritional Needs:   Kcal:  1650-1850  Protein:  90-100g  Fluid:  1.8L/day   Tilda Franco, MS, RD, LDN Inpatient Clinical Dietitian Contact via Secure chat

## 2024-01-13 ENCOUNTER — Telehealth (HOSPITAL_COMMUNITY): Payer: Self-pay | Admitting: Emergency Medicine

## 2024-01-13 DIAGNOSIS — E2740 Unspecified adrenocortical insufficiency: Secondary | ICD-10-CM | POA: Diagnosis not present

## 2024-01-13 DIAGNOSIS — D62 Acute posthemorrhagic anemia: Secondary | ICD-10-CM | POA: Insufficient documentation

## 2024-01-13 DIAGNOSIS — S72002A Fracture of unspecified part of neck of left femur, initial encounter for closed fracture: Secondary | ICD-10-CM | POA: Diagnosis not present

## 2024-01-13 LAB — CBC
HCT: 26.5 % — ABNORMAL LOW (ref 39.0–52.0)
Hemoglobin: 8.6 g/dL — ABNORMAL LOW (ref 13.0–17.0)
MCH: 31.2 pg (ref 26.0–34.0)
MCHC: 32.5 g/dL (ref 30.0–36.0)
MCV: 96 fL (ref 80.0–100.0)
Platelets: 138 10*3/uL — ABNORMAL LOW (ref 150–400)
RBC: 2.76 MIL/uL — ABNORMAL LOW (ref 4.22–5.81)
RDW: 19.3 % — ABNORMAL HIGH (ref 11.5–15.5)
WBC: 9.9 10*3/uL (ref 4.0–10.5)
nRBC: 0 % (ref 0.0–0.2)

## 2024-01-13 MED ORDER — HYDROCORTISONE SOD SUC (PF) 100 MG IJ SOLR: 50.0000 mg | Freq: Two times a day (BID) | INTRAMUSCULAR | Status: DC

## 2024-01-13 NOTE — Progress Notes (Signed)
Orthostatics:   Lying: BP: 103/69  Pulse: 88   Standing 1 minute:  BP: 89/63 Pulse:101  Standing 3 minutes: BP: 97/63  Pulse:105

## 2024-01-13 NOTE — Progress Notes (Signed)
 Physical Therapy Treatment Patient Details Name: Austin [name] MRN: [id number] DOB: 25-Jul-1956 Today's Date: 01/13/2024   History of Present Illness 68 yo male admitted with L hip fx after falling at home. S/P L THA-DA. Post op hypotension. Hx of ETOH abuse, chronic hyponatremia    PT Comments  Pt continues to progress well. Issued HEP for him to follow until HHPT begins.     If plan is discharge home, recommend the following: A little help with walking and/or transfers;A little help with bathing/dressing/bathroom;Assistance with cooking/housework;Assist for transportation;Help with stairs or ramp for entrance   Can travel by private vehicle        Equipment Recommendations  None recommended by PT    Recommendations for Other Services       Precautions / Restrictions Precautions Precautions: Fall Restrictions Weight Bearing Restrictions Per Provider Order: No Other Position/Activity Restrictions: WBAT     Mobility  Bed Mobility Overal bed mobility: Needs Assistance Bed Mobility: Supine to Sit, Sit to Supine     Supine to sit: Contact guard, HOB elevated Sit to supine: Contact guard assist, HOB elevated   General bed mobility comments: Pt used gait belt to assist L LE. Cues provided. Increased time.    Transfers Overall transfer level: Needs assistance Equipment used: Rolling walker (2 wheels) Transfers: Sit to/from Stand Sit to Stand: Contact guard assist, From elevated surface           General transfer comment: Cues for safety, technique, hand placement.    Ambulation/Gait Ambulation/Gait assistance: Contact guard assist Gait Distance (Feet): 135 Feet Assistive device: Rolling walker (2 wheels) Gait Pattern/deviations: Step-through pattern, Decreased stride length       General Gait Details: Cues for safety, sequencing, RW proximity, proper operation of RW. Progressed to step through pattern with time. Intermittent assist. Tolerated distance  well.   Stairs             Wheelchair Mobility     Tilt Bed    Modified Rankin (Stroke Patients Only)       Balance Overall balance assessment: Needs assistance, History of Falls         Standing balance support: Bilateral upper extremity supported, During functional activity, Reliant on assistive device for balance Standing balance-Leahy Scale: Fair                              Cognition Arousal: Alert Behavior During Therapy: WFL for tasks assessed/performed Overall Cognitive Status: Within Functional Limits for tasks assessed                                          Exercises Total Joint Exercises Ankle Circles/Pumps: AROM, 10 reps, Left Quad Sets: AROM, 10 reps, Left Heel Slides: AAROM, Left, 10 reps    General Comments        Pertinent Vitals/Pain Pain Assessment Pain Assessment: 0-10 Pain Score: 7  Pain Location: L hip Pain Descriptors / Indicators: Aching Pain Intervention(s): Limited activity within patient's tolerance, Monitored during session, Repositioned    Home Living                          Prior Function            PT Goals (current goals can now be found in the care plan section) Progress  towards PT goals: Progressing toward goals    Frequency    Min 1X/week      PT Plan      Co-evaluation              AM-PAC PT 6 Clicks Mobility   Outcome Measure  Help needed turning from your back to your side while in a flat bed without using bedrails?: A Little Help needed moving from lying on your back to sitting on the side of a flat bed without using bedrails?: A Little Help needed moving to and from a bed to a chair (including a wheelchair)?: A Little Help needed standing up from a chair using your arms (e.g., wheelchair or bedside chair)?: A Little Help needed to walk in hospital room?: A Little Help needed climbing 3-5 steps with a railing? : A Little 6 Click Score: 18     End of Session Equipment Utilized During Treatment: Gait belt Activity Tolerance: Patient tolerated treatment well Patient left: in bed;with call bell/phone within reach;with family/visitor present   PT Visit Diagnosis: Other abnormalities of gait and mobility (R26.89);Pain Pain - Right/Left: Left Pain - part of body: Hip     Time: 9056-8986 PT Time Calculation (min) (ACUTE ONLY): 30 min  Charges:    $Gait Training: 8-22 mins $Therapeutic Exercise: 8-22 mins PT General Charges $$ ACUTE PT VISIT: 1 Visit                         Dannial SQUIBB, PT Acute Rehabilitation  Office: [phone]

## 2024-01-13 NOTE — Evaluation (Addendum)
 Occupational Therapy Evaluation Patient Details Name: Austin [name] MRN: [id number] DOB: [birth year] Today's Date: 01/13/2024   History of Present Illness 68 yo male admitted with L hip fx after falling at home. S/P L THA-DA. Post op hypotension. Hx of ETOH abuse, chronic hyponatremia   Clinical Impression   Pt presents with decline in function and safety with ADLs and ADL mobility with impaired balance and endurance. PTA pt lives with his wife and was Ind with ADLs/selfcare and mobility.  Pt currently requires min A with LB ADLs, CGA with transfer and mobility using RW. Recommend HH OT services after acute stay d/c. Pt would benefit from acute OT services to address impairments to maximize level of function and safety VS (RN notified) Supine 106/72, HR 81 Sitting 107/75, HR 90 Standing 97/68, HR 79 After in room activity 116/91, HR 98    If plan is discharge home, recommend the following: A little help with bathing/dressing/bathroom;A little help with walking and/or transfers;Assist for transportation;Help with stairs or ramp for entrance    Functional Status Assessment  Patient has had a recent decline in their functional status and demonstrates the ability to make significant improvements in function in a reasonable and predictable amount of time.  Equipment Recommendations  Tub/shower seat    Recommendations for Other Services       Precautions / Restrictions Precautions Precautions: Fall Restrictions Weight Bearing Restrictions Per Provider Order: No Other Position/Activity Restrictions: WBAT      Mobility Bed Mobility Overal bed mobility: Needs Assistance Bed Mobility: Supine to Sit, Sit to Supine     Supine to sit: Contact guard, HOB elevated Sit to supine: Contact guard assist, HOB elevated   General bed mobility comments: pt able to mgt L LE to and off EOB    Transfers Overall transfer level: Needs assistance Equipment used: Rolling walker (2  wheels) Transfers: Sit to/from Stand Sit to Stand: Contact guard assist, From elevated surface           General transfer comment: Cues for safety, technique, hand placement.      Balance Overall balance assessment: Needs assistance, History of Falls   Sitting balance-Leahy Scale: Good     Standing balance support: Bilateral upper extremity supported, During functional activity, Reliant on assistive device for balance Standing balance-Leahy Scale: Fair                             ADL either performed or assessed with clinical judgement   ADL Overall ADL's : Needs assistance/impaired Eating/Feeding: Independent   Grooming: Wash/dry hands;Wash/dry face;Contact guard assist;Standing   Upper Body Bathing: Supervision/ safety   Lower Body Bathing: Minimal assistance   Upper Body Dressing : Supervision/safety   Lower Body Dressing: Minimal assistance   Toilet Transfer: Contact guard assist;Ambulation;Rolling walker (2 wheels);Cueing for safety   Toileting- Clothing Manipulation and Hygiene: Contact guard assist;Sit to/from stand       Functional mobility during ADLs: Contact guard assist;Rolling walker (2 wheels);Cueing for safety General ADL Comments: cue for hand placement     Vision Baseline Vision/History: 1 Wears glasses Ability to See in Adequate Light: 0 Adequate Patient Visual Report: No change from baseline       Perception         Praxis         Pertinent Vitals/Pain Pain Assessment Pain Assessment: 0-10 Pain Score: 6  Pain Location: L hip Pain Descriptors / Indicators: Aching Pain Intervention(s): Monitored during  session, Repositioned, Premedicated before session     Extremity/Trunk Assessment Upper Extremity Assessment Upper Extremity Assessment: Overall WFL for tasks assessed   Lower Extremity Assessment Lower Extremity Assessment: Defer to PT evaluation   Cervical / Trunk Assessment Cervical / Trunk Assessment: Normal    Communication Communication Communication: No apparent difficulties   Cognition Arousal: Alert Behavior During Therapy: WFL for tasks assessed/performed Overall Cognitive Status: Within Functional Limits for tasks assessed                                       General Comments       Exercises     Shoulder Instructions      Home Living Family/patient expects to be discharged to:: Private residence Living Arrangements: Spouse/significant other Available Help at Discharge: Family Type of Home: House   Entrance Stairs-Number of Steps: 1   Home Layout: One level     Bathroom Shower/Tub: Tub/shower unit;Walk-in shower   Bathroom Toilet: Standard     Home Equipment: None   Additional Comments: wife has RW, BSC, cane that pt can use      Prior Functioning/Environment Prior Level of Function : Independent/Modified Independent                        OT Problem List:        OT Treatment/Interventions:      OT Goals(Current goals can be found in the care plan section) Acute Rehab OT Goals Patient Stated Goal: go home OT Goal Formulation: With patient Time For Goal Achievement: 01/27/24 Potential to Achieve Goals: Good ADL Goals Pt Will Perform Grooming: with supervision;with set-up;standing;with caregiver independent in assisting Pt Will Perform Upper Body Bathing: with set-up;with modified independence;with caregiver independent in assisting Pt Will Perform Lower Body Bathing: with caregiver independent in assisting;with contact guard assist;with supervision;sit to/from stand Pt Will Perform Upper Body Dressing: with set-up;with modified independence;with caregiver independent in assisting Pt Will Perform Lower Body Dressing: with contact guard assist;with supervision;with caregiver independent in assisting;sit to/from stand Pt Will Transfer to Toilet: with supervision;ambulating Pt Will Perform Toileting - Clothing Manipulation and hygiene:  with supervision;with modified independence;sit to/from stand Pt Will Perform Tub/Shower Transfer: with contact guard assist;with supervision;ambulating;rolling walker;shower seat;grab bars  OT Frequency: Min 1X/week    Co-evaluation              AM-PAC OT 6 Clicks Daily Activity     Outcome Measure                 End of Session Equipment Utilized During Treatment: Gait belt;Rolling walker (2 wheels) Nurse Communication: Mobility status;Other (comment) (BPs)  Activity Tolerance: Patient tolerated treatment well Patient left: in bed;with family/visitor present  OT Visit Diagnosis: History of falling (Z91.81);Pain;Other abnormalities of gait and mobility (R26.89) Pain - Right/Left: Left Pain - part of body: Leg                Time: 8653-8590 OT Time Calculation (min): 23 min Charges:  OT General Charges $OT Visit: 1 Visit OT Evaluation $OT Eval Low Complexity: 1 Low OT Treatments $Therapeutic Activity: 8-22 mins   Jacques Karna Loose 01/13/2024, 3:01 PM

## 2024-01-13 NOTE — Telephone Encounter (Signed)
 Returned call to Mrs. Gage concerning her husband Austin [name].  She wanted to give me an update on the events of Friday and Saturday.  She states that he fell in the kitchen after reaching for something in the cabinet.  She states he was wearing socks and she saw his foot slide out from under him. She states that he laid in the floor for over an hour.  He initially did not want her to call 911.  His son's came and got him off the floor and she described him screaming in pain.  It was not until 6:00 a.m. Sat. Morning that he agreed to let her call 911 and he was transported to [facility] and ended up having a hip fracture that required a total hip replacement. She states that he will not be going to rehab and that they will send him home and receive his therapy there.  I advised her that I will do a visit tomorrow if he gets discharged in time and if not I will attempt home visit on Thursday.    Mary Sharps, EMT-Paramedic [phone] 01/13/2024

## 2024-01-13 NOTE — Plan of Care (Signed)

## 2024-01-13 NOTE — Progress Notes (Signed)
    Subjective:  Patient reports pain as mild to moderate.  Denies N/V/CP/SOB/Abd pain. He reports mild thigh pain. Family at bedside. They are hopeful for d/c home.     Objective:   VITALS:   Vitals:   01/12/24 1735 01/12/24 2216 01/13/24 0221 01/13/24 0507  BP: 102/63 (!) 103/53 (!) 112/36 109/75  Pulse: 96 87 82 80  Resp:  18 18 17   Temp:  98.5 F (36.9 C) 98.8 F (37.1 C) 98.4 F (36.9 C)  TempSrc:      SpO2: 98% 98% 96% 97%    NAD Neurologically intact ABD soft Neurovascular intact Sensation intact distally Intact pulses distally Dorsiflexion/Plantar flexion intact Incision: dressing C/D/I No cellulitis present Compartment soft   Lab Results  Component Value Date   WBC 9.9 01/13/2024   HGB 8.6 (L) 01/13/2024   HCT 26.5 (L) 01/13/2024   MCV 96.0 01/13/2024   PLT 138 (L) 01/13/2024   BMET    Component Value Date/Time   NA 130 (L) 01/12/2024 0523   NA 130 (L) 12/18/2022 1215   K 3.9 01/12/2024 0523   CL 100 01/12/2024 0523   CO2 21 (L) 01/12/2024 0523   GLUCOSE 102 (H) 01/12/2024 0523   BUN 22 01/12/2024 0523   BUN 9 12/18/2022 1215   CREATININE 1.18 01/12/2024 0523   CALCIUM 7.9 (L) 01/12/2024 0523   EGFR 63 12/18/2022 1215   GFRNONAA >60 01/12/2024 0523     Assessment/Plan: 2 Days Post-Op   Principal Problem:   Closed left hip fracture (HCC) Active Problems:   ETOH abuse   Essential hypertension   NICM (nonischemic cardiomyopathy) (HCC)   Hyponatremia   Adrenal insufficiency (HCC)   ABLA. Hemoglobin 8.6. Continue to monitor.    WBAT with walker DVT ppx: Aspirin , SCDs, TEDS PO pain control PT/OT: Continue to mobilize with PT/OT. Dispo:  -Patient under care of the medical team. Disposition per their recommendation. Patient hopeful for d/c home. Pain medication printed in chart. Aspirin  81mg  BID for DVT ppx x6 weeks.     Valery GORMAN Potters 01/13/2024, 12:46 PM   [facility]  [place] [address], [place],  [place] [address] Phone: [phone] [email or web address] Facebook  [employer]

## 2024-01-13 NOTE — Progress Notes (Signed)
  Progress Note   Patient: Austin [name] FMW:[id number] DOB: January 04, 1956 DOA: 01/10/2024     3 DOS: the patient was seen and examined on 01/13/2024   Brief hospital course: 68 year old man PMH including nonischemic cardiomyopathy secondary to alcohol  use, chronic hyponatremia presented after a fall at home resulting in left hip pain.  Admitted for left hip fracture.  Postoperatively had acute blood loss anemia and hypotension, workup consistent with adrenal insufficiency.  Started on stress dose steroids with improvement in blood pressure.  Plan for discharge home with home health when blood pressure and hemoglobin stable.  Consultants Orthopedics   Procedures/Events 2/2 Left total hip arthroplasty, anterior approach.   Assessment and Plan: Left hip fracture after mechanical fall  Status post operative intervention.  Management per orthopedics.   WBAT with walker DVT ppx: Aspirin , SCDs, TEDS PO pain control PT/OT: Continue to mobilize with PT/OT. Dispo:  Pain medication printed in chart. Aspirin  81mg  BID for DVT ppx x6 weeks.    Postoperative hypotension Acute adrenal insufficiency Asymptomatic.  Cortisol low.  Findings out of proportion to exam. Blood pressure has responded to Solu-Cortef .  Will stop and monitor blood pressure. Outpatient follow-up.  ABLA Perioperative in nature.  Check CBC in AM.   Nonischemic cardiomyopathy  Last EF measured was 25 to 30% on 12//24.  Cardiomyopathy likely related to alcoholism.   Hold Toprol -XL, Entresto , spironolactone  given hypotension.  Resume Jardiance  at discharge.   EtOH abuse on CIWA protocol.  Thiamine .   Chronic hyponatremia  Probably secondary to alcohol  use, cardiomyopathy sodium stable.  Follow-up as an outpatient.      Subjective:  Feels better  Physical Exam: Vitals:   01/12/24 1735 01/12/24 2216 01/13/24 0221 01/13/24 0507  BP: 102/63 (!) 103/53 (!) 112/36 109/75  Pulse: 96 87 82 80  Resp:  18 18 17   Temp:  98.5  F (36.9 C) 98.8 F (37.1 C) 98.4 F (36.9 C)  TempSrc:      SpO2: 98% 98% 96% 97%   Physical Exam Vitals reviewed.  Constitutional:      General: He is not in acute distress.    Appearance: He is not ill-appearing or toxic-appearing.  Cardiovascular:     Rate and Rhythm: Normal rate and regular rhythm.     Heart sounds: No murmur heard. Pulmonary:     Effort: Pulmonary effort is normal. No respiratory distress.     Breath sounds: No wheezing, rhonchi or rales.  Neurological:     Mental Status: He is alert.  Psychiatric:        Mood and Affect: Mood normal.        Behavior: Behavior normal.     Data Reviewed: Hgb downt to 8.6 Plts down to 138  Family Communication: none  Disposition: Status is: Inpatient Remains inpatient appropriate because: anemia, adrenal insufficiency     Time spent: 20 minutes  Author: Toribio Door, MD 01/13/2024 6:42 PM  For on call review [email or web address].

## 2024-01-14 DIAGNOSIS — S72002A Fracture of unspecified part of neck of left femur, initial encounter for closed fracture: Secondary | ICD-10-CM | POA: Diagnosis not present

## 2024-01-14 LAB — CBC
HCT: 25.7 % — ABNORMAL LOW (ref 39.0–52.0)
Hemoglobin: 8.3 g/dL — ABNORMAL LOW (ref 13.0–17.0)
MCH: 31.6 pg (ref 26.0–34.0)
MCHC: 32.3 g/dL (ref 30.0–36.0)
MCV: 97.7 fL (ref 80.0–100.0)
Platelets: 142 10*3/uL — ABNORMAL LOW (ref 150–400)
RBC: 2.63 MIL/uL — ABNORMAL LOW (ref 4.22–5.81)
RDW: 19.2 % — ABNORMAL HIGH (ref 11.5–15.5)
WBC: 11 10*3/uL — ABNORMAL HIGH (ref 4.0–10.5)
nRBC: 0 % (ref 0.0–0.2)

## 2024-01-14 LAB — BASIC METABOLIC PANEL
Anion gap: 10 (ref 5–15)
BUN: 14 mg/dL (ref 8–23)
CO2: 22 mmol/L (ref 22–32)
Calcium: 8.7 mg/dL — ABNORMAL LOW (ref 8.9–10.3)
Chloride: 100 mmol/L (ref 98–111)
Creatinine, Ser: 0.59 mg/dL — ABNORMAL LOW (ref 0.61–1.24)
GFR, Estimated: >60 mL/min (ref >60)
Glucose, Bld: 119 mg/dL — ABNORMAL HIGH (ref 70–99)
Potassium: 3.5 mmol/L (ref 3.5–5.1)
Sodium: 132 mmol/L — ABNORMAL LOW (ref 135–145)

## 2024-01-14 MED ORDER — FERROUS SULFATE 325 (65 FE) MG PO TABS
325.0000 mg | ORAL_TABLET | Freq: Every day | ORAL | Status: DC
  Administered 2024-01-14 – 2024-01-15 (×2): 325 mg via ORAL
  Filled 2024-01-14 (×2): qty 1

## 2024-01-14 MED ORDER — VITAMIN B-12 1000 MCG PO TABS
500.0000 ug | ORAL_TABLET | Freq: Every day | ORAL | Status: DC
  Administered 2024-01-14 – 2024-01-15 (×2): 500 ug via ORAL
  Filled 2024-01-14 (×2): qty 1

## 2024-01-14 MED ORDER — HYDROCORTISONE 5 MG PO TABS
5.0000 mg | ORAL_TABLET | Freq: Every day | ORAL | Status: DC
  Administered 2024-01-14: 5 mg via ORAL
  Filled 2024-01-14 (×2): qty 1

## 2024-01-14 MED ORDER — POTASSIUM CHLORIDE CRYS ER 20 MEQ PO TBCR
40.0000 meq | EXTENDED_RELEASE_TABLET | Freq: Once | ORAL | Status: AC
  Administered 2024-01-14: 40 meq via ORAL
  Filled 2024-01-14: qty 2

## 2024-01-14 MED ORDER — FUROSEMIDE 20 MG PO TABS
20.0000 mg | ORAL_TABLET | Freq: Every day | ORAL | Status: DC
  Administered 2024-01-14 – 2024-01-15 (×2): 20 mg via ORAL
  Filled 2024-01-14 (×2): qty 1

## 2024-01-14 MED ORDER — HYDROCORTISONE 10 MG PO TABS
10.0000 mg | ORAL_TABLET | Freq: Every day | ORAL | Status: DC
  Administered 2024-01-14 – 2024-01-15 (×2): 10 mg via ORAL
  Filled 2024-01-14 (×2): qty 1

## 2024-01-14 MED ORDER — ORAL CARE MOUTH RINSE: 15.0000 mL | OROMUCOSAL | Status: DC | PRN

## 2024-01-14 NOTE — Progress Notes (Signed)
 Physical Therapy Treatment Patient Details Name: Austin [name] MRN: [id number] DOB: Jun 09, 1956 Today's Date: 01/14/2024   History of Present Illness 68 yo male admitted with L hip fx after falling at home. S/P L THA-DA. Post op hypotension. Hx of ETOH abuse, chronic hyponatremia    PT Comments  Pt initially tried to decline working with therapy since he walked with nursing. He agreed to walk a bit and practice stair negotiation. He tolerated session well.     If plan is discharge home, recommend the following: A little help with walking and/or transfers;A little help with bathing/dressing/bathroom;Assistance with cooking/housework;Assist for transportation;Help with stairs or ramp for entrance   Can travel by private vehicle        Equipment Recommendations  None recommended by PT    Recommendations for Other Services       Precautions / Restrictions Precautions Precautions: Fall Restrictions Weight Bearing Restrictions Per Provider Order: No Other Position/Activity Restrictions: WBAT     Mobility  Bed Mobility Overal bed mobility: Needs Assistance Bed Mobility: Supine to Sit, Sit to Supine     Supine to sit: Supervision, HOB elevated, Used rails Sit to supine: Supervision, HOB elevated, Used rails   General bed mobility comments: Used gait belt to assist L LE. Increased time. Cues provided as needed    Transfers Overall transfer level: Needs assistance Equipment used: Rolling walker (2 wheels) Transfers: Sit to/from Stand Sit to Stand: Contact guard assist           General transfer comment: Cues for safety, technique, hand placement.    Ambulation/Gait Ambulation/Gait assistance: Contact guard assist Gait Distance (Feet): 75 Feet Assistive device: Rolling walker (2 wheels) Gait Pattern/deviations: Decreased stride length, Step-through pattern       General Gait Details: Cues for safety, RW proximity, proper operation of RW. Progressed to step through  pattern with time. Tolerated distance well.   Stairs Stairs: Yes Stairs assistance: Contact guard assist Stair Management: One rail Left, Step to pattern Number of Stairs: 2 General stair comments: up/down 2 stairwell steps with use of 1 railing. Verbally reviewed proper technique for going up 1 step with RW-RW then step up with good foot, then step up with bad foot   Wheelchair Mobility     Tilt Bed    Modified Rankin (Stroke Patients Only)       Balance Overall balance assessment: Needs assistance, History of Falls         Standing balance support: Bilateral upper extremity supported, During functional activity, Reliant on assistive device for balance Standing balance-Leahy Scale: Fair                              Cognition Arousal: Alert Behavior During Therapy: WFL for tasks assessed/performed Overall Cognitive Status: Within Functional Limits for tasks assessed                                          Exercises      General Comments        Pertinent Vitals/Pain Pain Assessment Pain Assessment: Faces Faces Pain Scale: Hurts even more Pain Location: L hip Pain Descriptors / Indicators: Aching Pain Intervention(s): Monitored during session    Home Living  Prior Function            PT Goals (current goals can now be found in the care plan section) Progress towards PT goals: Progressing toward goals    Frequency    Min 1X/week      PT Plan      Co-evaluation              AM-PAC PT 6 Clicks Mobility   Outcome Measure  Help needed turning from your back to your side while in a flat bed without using bedrails?: A Little Help needed moving from lying on your back to sitting on the side of a flat bed without using bedrails?: A Little Help needed moving to and from a bed to a chair (including a wheelchair)?: A Little Help needed standing up from a chair using your arms  (e.g., wheelchair or bedside chair)?: A Little Help needed to walk in hospital room?: A Little Help needed climbing 3-5 steps with a railing? : A Little 6 Click Score: 18    End of Session Equipment Utilized During Treatment: Gait belt Activity Tolerance: Patient tolerated treatment well Patient left: in bed;with call bell/phone within reach   PT Visit Diagnosis: Other abnormalities of gait and mobility (R26.89);Pain Pain - Right/Left: Left Pain - part of body: Hip     Time: 8645-8590 PT Time Calculation (min) (ACUTE ONLY): 15 min  Charges:    $Gait Training: 8-22 mins PT General Charges $$ ACUTE PT VISIT: 1 Visit                        Austin [name], PT Acute Rehabilitation  Office: [phone]

## 2024-01-14 NOTE — Plan of Care (Signed)
  Problem: Education: Goal: Knowledge of General Education information will improve Description: Including pain rating scale, medication(s)/side effects and non-pharmacologic comfort measures Outcome: Progressing   Problem: Clinical Measurements: Goal: Ability to maintain clinical measurements within normal limits will improve Outcome: Progressing Goal: Will remain free from infection Outcome: Progressing   Problem: Activity: Goal: Risk for activity intolerance will decrease Outcome: Progressing   Problem: Nutrition: Goal: Adequate nutrition will be maintained Outcome: Progressing   Problem: Pain Managment: Goal: General experience of comfort will improve and/or be controlled Outcome: Progressing   Problem: Safety: Goal: Ability to remain free from injury will improve Outcome: Progressing   Problem: Activity: Goal: Ability to avoid complications of mobility impairment will improve Outcome: Progressing

## 2024-01-14 NOTE — Progress Notes (Signed)
 PROGRESS NOTE    Austin [name]  FMW:[id number] DOB: 1956-06-05 DOA: 01/10/2024 PCP: Austin Speaks, FNP   Brief Narrative: 68 year old with past medical history significant for nonischemic cardiomyopathy secondary to alcohol  use, chronic hyponatremia presented after Austin fall at home resulting in left hip pain admitted for left hip fracture.  Postoperative had acute blood loss anemia and hypotension, workup consistent with adrenal insufficiency he was started on Austin stress dose steroid with improvement in blood pressure.  Plan is to discharge home when blood pressure and hemoglobin stable.    Assessment & Plan:   Principal Problem:   Closed left hip fracture (HCC) Active Problems:   ETOH abuse   Essential hypertension   NICM (nonischemic cardiomyopathy) (HCC)   Hyponatremia   Adrenal insufficiency (HCC)   ABLA (acute blood loss anemia)   1-Left hip fracture after mechanical fall Status post operative intervention management per orthopedic WBAT with walker DVT prophylaxis aspirin  SCDs Need aspirin  81 twice daily for DVT for 6 weeks  Post Op hypotension Adrenal insufficiency, acute Heart disorder was low at 2.1, making diagnosis for adrenal insufficiency likely.  Blood pressure responded with Cortef .  Cortisol was checked prior to steroid injections. -Start low-dose hydrocortisone , his cortisol level was very low. Monitor 1 more day, plan to resume his Lasix  due to his non ischemic cardiomyopathy   ABLA: Perioperative in nature: Hemoglobin baseline appears to be 10 Globin dropped to 8.3 from 12 on admission Hb stable today.  Start B12, folic acid , ferrous sulfate   Nonischemic cardiomyopathy: Last ejection fraction was 25 to 30%, Holding Toprol  Entresto  spironolactone  and Lasix  in the setting of hypotension Plan to resume Lasix  today and monitor blood pressure.  EtOH abuse on CIWA, continue with thiamine   Chronic hyponatremia monitor       Nutrition Problem:  Increased nutrient needs Etiology: hip fracture, post-op healing    Signs/Symptoms: estimated needs    Interventions: MVI  Estimated body mass index is 21.56 kg/m as calculated from the following:   Height as of 12/23/23: 5' 9 (1.753 m).   Weight as of 01/07/24: 66.2 kg.   DVT prophylaxis: aspirin   Code Status: full code Family Communication: care discussed with patient  Disposition Plan:  Status is: Inpatient Remains inpatient appropriate because: management of hip fracture, hypotension     Consultants:  Ortho  Procedures:  Hips sx  Antimicrobials:    Subjective: He is alert, feels well.   Objective: Vitals:   01/13/24 0507 01/13/24 1941 01/13/24 2345 01/14/24 0447  BP: 109/75 124/70 118/72 110/72  Pulse: 80 94 82 86  Resp: 17 18  17   Temp: 98.4 F (36.9 C) (!) 97.5 F (36.4 C)  97.8 F (36.6 C)  TempSrc:      SpO2: 97% 100%  100%   No intake or output data in the 24 hours ending 01/14/24 0800 There were no vitals filed for this visit.  Examination:  General exam: Appears calm and comfortable  Respiratory system: Clear to auscultation. Respiratory effort normal. Cardiovascular system: S1 & S2 heard, RRR. No JVD, murmurs, rubs, gallops or clicks. No pedal edema. Gastrointestinal system: Abdomen is nondistended, soft and nontender. No organomegaly or masses felt. Normal bowel sounds heard. Central nervous system: Alert and oriented.  Extremities: Symmetric 5 x 5 power.    Data Reviewed: I have personally reviewed following labs and imaging studies  CBC: Recent Labs  Lab 01/10/24 1617 01/11/24 0640 01/12/24 1041 01/13/24 0541 01/14/24 0544  WBC 8.3 8.0 11.2* 9.9 11.0*  NEUTROABS 6.7  --   --   --   --   HGB 13.0 13.0 9.7* 8.6* 8.3*  HCT 38.8* 37.8* 29.1* 26.5* 25.7*  MCV 94.4 92.9 97.7 96.0 97.7  PLT 207 190 145* 138* 142*   Basic Metabolic Panel: Recent Labs  Lab 01/10/24 1830 01/11/24 0640 01/12/24 0523 01/14/24 0544  NA 129*  127* 130* 132*  K 4.2 3.9 3.9 3.5  CL 92* 92* 100 100  CO2 24 23 21* 22  GLUCOSE 114* 100* 102* 119*  BUN 20 18 22 14   CREATININE 0.90 0.77 1.18 0.59*  CALCIUM 9.1 9.1 7.9* 8.7*   GFR: Estimated Creatinine Clearance: 82.8 mL/min (Austin) (by C-G formula based on SCr of 0.59 mg/dL (L)). Liver Function Tests: Recent Labs  Lab 01/10/24 1830  AST 31  ALT 17  ALKPHOS 98  BILITOT 2.5*  PROT 8.2*  ALBUMIN 3.9   No results for input(s): LIPASE, AMYLASE in the last 168 hours. No results for input(s): AMMONIA in the last 168 hours. Coagulation Profile: No results for input(s): INR, PROTIME in the last 168 hours. Cardiac Enzymes: No results for input(s): CKTOTAL, CKMB, CKMBINDEX, TROPONINI in the last 168 hours. BNP (last 3 results) No results for input(s): PROBNP in the last 8760 hours. HbA1C: No results for input(s): HGBA1C in the last 72 hours. CBG: No results for input(s): GLUCAP in the last 168 hours. Lipid Profile: No results for input(s): CHOL, HDL, LDLCALC, TRIG, CHOLHDL, LDLDIRECT in the last 72 hours. Thyroid Function Tests: No results for input(s): TSH, T4TOTAL, FREET4, T3FREE, THYROIDAB in the last 72 hours. Anemia Panel: No results for input(s): VITAMINB12, FOLATE, FERRITIN, TIBC, IRON, RETICCTPCT in the last 72 hours. Sepsis Labs: No results for input(s): PROCALCITON, LATICACIDVEN in the last 168 hours.  No results found for this or any previous visit (from the past 240 hours).       Radiology Studies: No results found.      Scheduled Meds:  aspirin   81 mg Oral BID   docusate sodium   100 mg Oral BID   folic acid   1 mg Oral Daily   LORazepam   0-4 mg Intravenous Q12H   multivitamin with minerals  1 tablet Oral Daily   pantoprazole   40 mg Oral Daily   senna  1 tablet Oral BID   thiamine   100 mg Oral Daily   Or   thiamine   100 mg Intravenous Daily   Continuous Infusions:   LOS: 4 days     Time spent: 35 Minutes    Austin [name] Austin [name] Austin [name]   If 7PM-7AM, please contact night-coverage [email or web address]  01/14/2024, 8:00 AM

## 2024-01-14 NOTE — Plan of Care (Signed)

## 2024-01-15 ENCOUNTER — Telehealth (HOSPITAL_COMMUNITY): Payer: Self-pay | Admitting: Emergency Medicine

## 2024-01-15 DIAGNOSIS — S72002A Fracture of unspecified part of neck of left femur, initial encounter for closed fracture: Secondary | ICD-10-CM | POA: Diagnosis not present

## 2024-01-15 LAB — HEMOGLOBIN AND HEMATOCRIT, BLOOD
HCT: 26.2 % — ABNORMAL LOW (ref 39.0–52.0)
Hemoglobin: 8.2 g/dL — ABNORMAL LOW (ref 13.0–17.0)

## 2024-01-15 MED ORDER — FOLIC ACID 1 MG PO TABS: 1.0000 mg | ORAL_TABLET | Freq: Every day | ORAL | 0 refills | Status: DC

## 2024-01-15 MED ORDER — FERROUS SULFATE 325 (65 FE) MG PO TABS: 325.0000 mg | ORAL_TABLET | Freq: Every day | ORAL | 3 refills | Status: AC

## 2024-01-15 MED ORDER — CYANOCOBALAMIN 500 MCG PO TABS: 500.0000 ug | ORAL_TABLET | Freq: Every day | ORAL | 0 refills | Status: DC

## 2024-01-15 MED ORDER — SENNA 8.6 MG PO TABS: 1.0000 | ORAL_TABLET | Freq: Two times a day (BID) | ORAL | 0 refills | Status: DC

## 2024-01-15 MED ORDER — HYDROCORTISONE 5 MG PO TABS: 5.0000 mg | ORAL_TABLET | Freq: Every day | ORAL | 0 refills | Status: DC

## 2024-01-15 MED ORDER — HYDROCORTISONE 10 MG PO TABS: 10.0000 mg | ORAL_TABLET | Freq: Every day | ORAL | 0 refills | Status: DC

## 2024-01-15 MED ORDER — DOCUSATE SODIUM 100 MG PO CAPS: 100.0000 mg | ORAL_CAPSULE | Freq: Two times a day (BID) | ORAL | 0 refills | Status: DC

## 2024-01-15 NOTE — Telephone Encounter (Signed)
 Called and spoke with Austin [name] and scheduled home visit for tomorrow 2/7 @ 2:00. He is home following hip replacement surgery.    Ermalinda Hays, EMT-Paramedic [phone] 01/15/2024

## 2024-01-15 NOTE — Plan of Care (Signed)
   Problem: Education: Goal: Knowledge of General Education information will improve Description: Including pain rating scale, medication(s)/side effects and non-pharmacologic comfort measures Outcome: Progressing   Problem: Clinical Measurements: Goal: Ability to maintain clinical measurements within normal limits will improve Outcome: Progressing Goal: Will remain free from infection Outcome: Progressing   Problem: Activity: Goal: Risk for activity intolerance will decrease Outcome: Progressing   Problem: Nutrition: Goal: Adequate nutrition will be maintained Outcome: Progressing   Problem: Pain Managment: Goal: General experience of comfort will improve and/or be controlled Outcome: Progressing   Problem: Safety: Goal: Ability to remain free from injury will improve Outcome: Progressing

## 2024-01-15 NOTE — TOC Transition Note (Signed)
 Transition of Care [facility]) - Discharge Note   Patient Details  Name: Austin [name] MRN: [id number] Date of Birth: 14-Dec-1955  [facility]) CM/SW Contact:  Hoy DELENA Bigness, LCSW Phone Number: 01/15/2024, 12:10 PM   Clinical Narrative:    Pt to return home w/ spouse. Pt to receive HHPT/OT w/ [facility]. No further TOC needs identified.    Final next level of care: Home w Home Health Services Barriers to Discharge: No Barriers Identified   Patient Goals and CMS Choice Patient states their goals for this hospitalization and ongoing recovery are:: To return home CMS Medicare.gov Compare Post Acute Care list provided to:: Patient Choice offered to / list presented to : [name] ownership interest in [facility].provided to::  (NA)    Discharge Placement                       Discharge Plan and Services Additional resources added to the After Visit Summary for   In-house Referral: Clinical Social Work Discharge Planning Services: NA Post Acute Care Choice: Home Health          DME Arranged: N/A DME Agency: NA       HH Arranged: PT, OT HH Agency: [facility] Date [facility] Agency Contacted: 01/12/24 Time HH Agency Contacted: 1308 Representative spoke with at [facility] Agency: Cindie  Social Drivers of Health (SDOH) Interventions SDOH Screenings   Food Insecurity: No Food Insecurity (01/11/2024)  Housing: Low Risk  (01/12/2024)  Transportation Needs: No Transportation Needs (01/11/2024)  Utilities: Not At Risk (01/11/2024)  Alcohol  Screen: Low Risk  (12/06/2020)  Depression (PHQ2-9): Low Risk  (05/15/2023)  Financial Resource Strain: Low Risk  (02/27/2022)  Physical Activity: Inactive (02/27/2022)  Social Connections: Socially Integrated (01/11/2024)  Stress: No Stress Concern Present (02/27/2022)  Tobacco Use: Low Risk  (01/11/2024)     Readmission Risk Interventions    01/15/2024   12:10 PM 01/12/2024    1:06 PM  Readmission Risk Prevention  Plan  Transportation Screening Complete Complete  PCP or Specialist Appt within 5-7 Days  Complete  Home Care Screening  Complete  Medication Review (RN CM)  Complete

## 2024-01-15 NOTE — Progress Notes (Signed)
 Occupational Therapy Treatment Patient Details Name: Austin [name] MRN: [id number] DOB: 07-25-1956 Today's Date: 01/15/2024   History of present illness 68 yo male admitted with L hip fx after falling at home. S/P L THA-DA. Post op hypotension. Hx of ETOH abuse, chronic hyponatremia   OT comments  Patient progressing and showed improved ability to perform LB dressing without assistance for underwear/pants other than cues for safety and sequencing and setup, as well as performing functional bathroom mobility and bed mobility with RW and supervision only, compared to previous session when pt required CGA.   Patient remains limited by post-op pain, BP fluctuations with pt denying symptoms, and decreased activity tolerance from surgery, along with deficits noted below. Pt continues to demonstrate very good rehab potential and would benefit from continued skilled OT to increase safety and independence with ADLs and functional transfers to allow pt to return home safely and reduce caregiver burden and fall risk.       If plan is discharge home, recommend the following:  A little help with bathing/dressing/bathroom;A little help with walking and/or transfers;Assist for transportation;Help with stairs or ramp for entrance   Equipment Recommendations  Tub/shower seat;BSC/3in1    Recommendations for Other Services      Precautions / Restrictions Precautions Precautions: Fall Restrictions Weight Bearing Restrictions Per Provider Order: No Other Position/Activity Restrictions: WBAT       Mobility Bed Mobility   Bed Mobility: Supine to Sit, Sit to Supine     Supine to sit: Supervision, HOB elevated (Used gait belt. Pt encouraged to TRY and move LLE before grabbing belt to allow mm to stengthen. Pt verb undeerstanding but still prefers use of belt.) Sit to supine: Supervision, HOB elevated   General bed mobility comments: Used gait belt to assist L LE. with supine<>sit    Transfers                          Balance Overall balance assessment: Needs assistance, History of Falls   Sitting balance-Leahy Scale: Good     Standing balance support: Bilateral upper extremity supported, During functional activity, Reliant on assistive device for balance Standing balance-Leahy Scale: Fair                             ADL either performed or assessed with clinical judgement   ADL                         Lower Body Dressing Details (indicate cue type and reason): Pt trained in sequencing underwear/pants for LE dressing. Cautioned to always sit when donning/doffing clothing over feet to prevent falls. Pt lauighed and stated that he likely would have attempted this and agreed to take these precautions. Pt able to don underwear and pants with cues to don oth over feet to prevent stand-sit-stand-sit.  Pt with increased pain trying to don LT sock and OT performed backwards chaining over toes then pt able to pull up. Pt confirmed his family can assist with this and he declined education on AE. Toilet Transfer: Supervision/safety;Rolling walker (2 wheels);BSC/3in1 Toilet Transfer Details (indicate cue type and reason): Pt able to stand from EOB x 3 (dressing and to ambulate) and demonstrate in-room ambulation with RW and supervision. Pt educated on RW safety as he moved including how to safely approach sink, doors and cabinets. Pt also educated on how to use his BSC,  poisitioning and use of splash guard. Pt verbalized unerstanding. Toileting- Clothing Manipulation and Hygiene: Supervision/safety;Sitting/lateral lean;Sit to/from stand              Extremity/Trunk Assessment Upper Extremity Assessment Upper Extremity Assessment: Overall WFL for tasks assessed   Lower Extremity Assessment Lower Extremity Assessment: Defer to PT evaluation   Cervical / Trunk Assessment Cervical / Trunk Assessment: Normal    Vision Baseline Vision/History: 1 Wears  glasses Ability to See in Adequate Light: 0 Adequate Patient Visual Report: No change from baseline     Perception     Praxis      Cognition Arousal: Alert Behavior During Therapy: WFL for tasks assessed/performed Overall Cognitive Status: Within Functional Limits for tasks assessed                                          Exercises      Shoulder Instructions       General Comments      Pertinent Vitals/ Pain       Pain Assessment Pain Assessment: 0-10 Pain Score: 6  Pain Location: L hip Pain Descriptors / Indicators: Sore Pain Intervention(s): Monitored during session, Limited activity within patient's tolerance, Premedicated before session  Home Living                                          Prior Functioning/Environment              Frequency  Min 1X/week        Progress Toward Goals  OT Goals(current goals can now be found in the care plan section)  Progress towards OT goals: Progressing toward goals  Acute Rehab OT Goals Patient Stated Goal: Go home today. OT Goal Formulation: With patient Time For Goal Achievement: 01/27/24 Potential to Achieve Goals: Good  Plan      Co-evaluation                 AM-PAC OT 6 Clicks Daily Activity     Outcome Measure   Help from another person eating meals?: None Help from another person taking care of personal grooming?: None Help from another person toileting, which includes using toliet, bedpan, or urinal?: A Little Help from another person bathing (including washing, rinsing, drying)?: A Little Help from another person to put on and taking off regular upper body clothing?: A Little (setup) Help from another person to put on and taking off regular lower body clothing?: A Little 6 Click Score: 20    End of Session    OT Visit Diagnosis: History of falling (Z91.81);Pain;Other abnormalities of gait and mobility (R26.89) Pain - Right/Left: Left Pain - part  of body: Leg   Activity Tolerance Patient tolerated treatment well   Patient Left in bed;with call bell/phone within reach   Nurse Communication          Time: 8940-8886 OT Time Calculation (min): 14 min  Charges: OT General Charges $OT Visit: 1 Visit OT Treatments $Self Care/Home Management : 8-22 mins  Delon, OT Acute Rehab Services Office: [phone] 01/15/2024   Delon Falter 01/15/2024, 11:26 AM

## 2024-01-15 NOTE — Discharge Summary (Signed)
 Physician Discharge Summary   Patient: Austin [name] MRN: [id number] DOB: 19-Oct-1956  Admit date:     01/10/2024  Discharge date: 01/15/24  Discharge Physician: Owen DELENA Lore   PCP: Georgina Speaks, FNP   Recommendations at discharge:   Needs follow up with endocrinologist for further evaluation of adrenal insufficiency.  Needs to monitor BP and further titration of medication for HF Needs CBC to follow Hb level.  Follow up with ortho out patient.   Discharge Diagnoses: Principal Problem:   Closed left hip fracture (HCC) Active Problems:   ETOH abuse   Essential hypertension   NICM (nonischemic cardiomyopathy) (HCC)   Hyponatremia   Adrenal insufficiency (HCC)   ABLA (acute blood loss anemia)  Resolved Problems:   * No resolved hospital problems. *  Hospital Course: 68 year old with past medical history significant for nonischemic cardiomyopathy secondary to alcohol  use, chronic hyponatremia presented after a fall at home resulting in left hip pain admitted for left hip fracture.  Postoperative had acute blood loss anemia and hypotension, workup consistent with adrenal insufficiency he was started on a stress dose steroid with improvement in blood pressure.  Plan is to discharge home when blood pressure and hemoglobin stable.    Consultants Orthopedics   Procedures/Events 2/2 Left total hip arthroplasty, anterior approach.   Assessment and Plan: 1-Left hip fracture after mechanical fall Status post operative intervention management per orthopedic WBAT with walker DVT prophylaxis aspirin  SCDs Need aspirin  81 twice daily for DVT for 6 weeks  stable for discharge.   Post Op hypotension Adrenal insufficiency, acute Heart disorder was low at 2.1, making diagnosis for adrenal insufficiency likely.  Blood pressure responded with Cortef .  Cortisol was checked prior to steroid injections. -Start low-dose hydrocortisone , his cortisol level was very low. BP stable on  lasix  and hydrocortisone .    ABLA: Perioperative in nature: Hemoglobin baseline appears to be 10 Globin dropped to 8.3 from 12 on admission Hb stable today.  Started B12, folic acid , ferrous sulfate    Nonischemic cardiomyopathy: Last ejection fraction was 25 to 30%, Holding Toprol  Entresto  spironolactone  and Lasix  in the setting of hypotension Plan to resume Lasix  2/05 and monitor blood pressure. tolerated lasix .  Defer resumption of entresto  out patient.   EtOH abuse on CIWA, continue with thiamine    Chronic hyponatremia monitor      Nutrition Problem: Increased nutrient needs Etiology: hip fracture, post-op healing       Signs/Symptoms: estimated needs       Interventions: MVI   Estimated body mass index is 21.56 kg/m as calculated from the following:   Height as of 12/23/23: 5' 9 (1.753 m).   Weight as of 01/07/24: 66.2 kg.       Consultants: Ortho Procedures performed: hip Sx Disposition: Home Diet recommendation:  Discharge Diet Orders (From admission, onward)     Start     Ordered   01/15/24 0000  Diet - low sodium heart healthy        01/15/24 1153           Cardiac diet DISCHARGE MEDICATION: Allergies as of 01/15/2024       Reactions   Latex Swelling, Rash        Medication List     STOP taking these medications    Entresto  49-51 MG Generic drug: sacubitril -valsartan    metoprolol  succinate 100 MG 24 hr tablet Commonly known as: TOPROL -XL   spironolactone  25 MG tablet Commonly known as: ALDACTONE        TAKE  these medications    aspirin  81 MG chewable tablet Commonly known as: Aspirin  Childrens Chew 1 tablet (81 mg total) by mouth 2 (two) times daily with a meal.   cyanocobalamin  500 MCG tablet Commonly known as: VITAMIN B12 Take 1 tablet (500 mcg total) by mouth daily. Start taking on: January 16, 2024   docusate sodium  100 MG capsule Commonly known as: COLACE Take 1 capsule (100 mg total) by mouth 2 (two) times  daily.   empagliflozin  10 MG Tabs tablet Commonly known as: Jardiance  Take 1 tablet (10 mg total) by mouth daily before breakfast.   ferrous sulfate  325 (65 FE) MG tablet Take 1 tablet (325 mg total) by mouth daily with breakfast. Start taking on: January 16, 2024   folic acid  1 MG tablet Commonly known as: FOLVITE  Take 1 tablet (1 mg total) by mouth daily. Start taking on: January 16, 2024   furosemide  40 MG tablet Commonly known as: LASIX  Take 1 tablet (40 mg total) by mouth daily.   HYDROcodone -acetaminophen  5-325 MG tablet Commonly known as: NORCO/VICODIN Take 1 tablet by mouth every 4 (four) hours as needed for up to 7 days for moderate pain (pain score 4-6) or severe pain (pain score 7-10).   hydrocortisone  5 MG tablet Commonly known as: CORTEF  Take 1 tablet (5 mg total) by mouth daily in the afternoon. Take in the afternoon at 2 PM   hydrocortisone  10 MG tablet Commonly known as: CORTEF  Take 1 tablet (10 mg total) by mouth daily. Take in the morning at 7 am. Start taking on: January 16, 2024   loperamide 2 MG capsule Commonly known as: IMODIUM Take 2 mg by mouth every 6 (six) hours as needed for diarrhea or loose stools.   multivitamin tablet Take 1 tablet by mouth daily.   senna 8.6 MG Tabs tablet Commonly known as: SENOKOT Take 1 tablet (8.6 mg total) by mouth 2 (two) times daily.               Durable Medical Equipment  (From admission, onward)           Start     Ordered   01/14/24 1045  For home use only DME Bedside commode  Once       Question:  Patient needs a bedside commode to treat with the following condition  Answer:  Balance disorder   01/14/24 1044            Follow-up Information     Camino Tassajara, PA-C Follow up.   Specialty: Orthopedic Surgery Contact information: [address], [address] [place] [address] [phone]         [facility], [facility] Follow up.   Specialty: Home Health Services Why:  Hedda will follow up with you to provide home health physical therapy Contact information: [address] [place] [address] [phone]                Discharge Exam: Fredricka Weights   01/15/24 1347  Weight: 66.2 kg  General; NAD  Condition at discharge: stable  The results of significant diagnostics from this hospitalization (including imaging, microbiology, ancillary and laboratory) are listed below for reference.   Imaging Studies: DG Pelvis Portable Result Date: 01/11/2024 CLINICAL DATA:  Status post left hip ORIF. EXAM: PORTABLE PELVIS 1-2 VIEWS COMPARISON:  01/10/2024. FINDINGS: Interval left hip arthroplasty. Femoral stem appears well seated. Subcutaneous and joint air are noted. Surgical skin staples are in place. IMPRESSION: Left hip  arthroplasty with expected postoperative findings. Electronically Signed   By: Newell Eke M.D.   On: 01/11/2024 14:37   DG HIP UNILAT WITH PELVIS 1V LEFT Result Date: 01/11/2024 CLINICAL DATA:  Left hip arthroplasty. EXAM: DG HIP (WITH OR WITHOUT PELVIS) 1V*L* COMPARISON:  01/10/2024. FINDINGS: Three intraoperative fluoroscopic spot views of the left hip show an acetabular cup and femoral reamer in place. IMPRESSION: Intraoperative visualization for left hip arthroplasty. Electronically Signed   By: Newell Eke M.D.   On: 01/11/2024 14:36   DG C-Arm 1-60 Min-No Report Result Date: 01/11/2024 Fluoroscopy was utilized by the requesting physician.  No radiographic interpretation.   DG Knee Left Port Result Date: 01/11/2024 CLINICAL DATA:  68 year old male with history of left femoral neck fracture. Left knee pain. EXAM: PORTABLE LEFT KNEE - 1-2 VIEW COMPARISON:  No priors. FINDINGS: Two views of the left knee demonstrate no acute displaced fracture, subluxation or dislocation. Mild degenerative changes of osteoarthritis are noted in a tricompartmental distribution. Extensive vascular calcifications are noted. IMPRESSION: 1. No  acute radiographic abnormality of the left knee. 2. Mild tricompartmental osteoarthritis. 3. Atherosclerosis. Electronically Signed   By: Toribio Aye M.D.   On: 01/11/2024 08:54   DG HIP UNILAT WITH PELVIS 2-3 VIEWS LEFT Result Date: 01/10/2024 CLINICAL DATA:  Fall yesterday, pain.  Unable to bear weight. EXAM: DG HIP (WITH OR WITHOUT PELVIS) 2-3V LEFT COMPARISON:  None. FINDINGS: Mildly comminuted left femoral neck fracture with some superior displacement of the distal fracture fragment. No dislocation. No additional evidence of an acute fracture. IMPRESSION: Mildly comminuted left femoral neck fracture. Electronically Signed   By: Newell Eke M.D.   On: 01/10/2024 15:03    Microbiology: Results for orders placed or performed during the hospital encounter of 03/26/20  SARS CORONAVIRUS 2 (TAT 6-24 HRS) Nasopharyngeal Nasopharyngeal Swab     Status: None   Collection Time: 03/26/20 12:44 PM   Specimen: Nasopharyngeal Swab  Result Value Ref Range Status   SARS Coronavirus 2 NEGATIVE NEGATIVE Final    Comment: (NOTE) SARS-CoV-2 target nucleic acids are NOT DETECTED. The SARS-CoV-2 RNA is generally detectable in upper and lower respiratory specimens during the acute phase of infection. Negative results do not preclude SARS-CoV-2 infection, do not rule out co-infections with other pathogens, and should not be used as the sole basis for treatment or other patient management decisions. Negative results must be combined with clinical observations, patient history, and epidemiological information. The expected result is Negative. Fact Sheet for Patients: hairslick.no Fact Sheet for Healthcare Providers: quierodirigir.com This test is not yet approved or cleared by the [place]  FDA and  has been authorized for detection and/or diagnosis of SARS-CoV-2 by FDA under an Emergency Use Authorization (EUA). This EUA will remain  in effect  (meaning this test can be used) for the duration of the COVID-19 declaration under Section 56 4(b)(1) of the Act, 21 U.S.C. section 360bbb-3(b)(1), unless the authorization is terminated or revoked sooner. Performed at [facility], [address], [place], [place] [address]     Labs: CBC: Recent Labs  Lab 01/10/24 1617 01/11/24 0640 01/12/24 1041 01/13/24 0541 01/14/24 0544 01/15/24 0938  WBC 8.3 8.0 11.2* 9.9 11.0*  --   NEUTROABS 6.7  --   --   --   --   --   HGB 13.0 13.0 9.7* 8.6* 8.3* 8.2*  HCT 38.8* 37.8* 29.1* 26.5* 25.7* 26.2*  MCV 94.4 92.9 97.7 96.0 97.7  --   PLT 207  190 145* 138* 142*  --    Basic Metabolic Panel: Recent Labs  Lab 01/10/24 1830 01/11/24 0640 01/12/24 0523 01/14/24 0544  NA 129* 127* 130* 132*  K 4.2 3.9 3.9 3.5  CL 92* 92* 100 100  CO2 24 23 21* 22  GLUCOSE 114* 100* 102* 119*  BUN 20 18 22 14   CREATININE 0.90 0.77 1.18 0.59*  CALCIUM 9.1 9.1 7.9* 8.7*   Liver Function Tests: Recent Labs  Lab 01/10/24 1830  AST 31  ALT 17  ALKPHOS 98  BILITOT 2.5*  PROT 8.2*  ALBUMIN 3.9   CBG: No results for input(s): GLUCAP in the last 168 hours.  Discharge time spent: greater than 30 minutes.  Signed: Owen DELENA Lore, MD [facility] 01/15/2024

## 2024-01-16 ENCOUNTER — Other Ambulatory Visit (HOSPITAL_COMMUNITY): Payer: Self-pay

## 2024-01-16 ENCOUNTER — Other Ambulatory Visit (HOSPITAL_COMMUNITY): Payer: Self-pay | Admitting: Emergency Medicine

## 2024-01-16 ENCOUNTER — Telehealth: Payer: Self-pay

## 2024-01-16 NOTE — Transitions of Care (Post Inpatient/ED Visit) (Signed)
   01/16/2024  Name: Austin [name] MRN: [id number] DOB: [birth year]  Today's TOC FU Call Status: Today's TOC FU Call Status:: Unsuccessful Call (1st Attempt) Unsuccessful Call (1st Attempt) Date: 01/16/24  Attempted to reach the patient regarding the most recent Inpatient/ED visit.  Follow Up Plan: Additional outreach attempts will be made to reach the patient to complete the Transitions of Care (Post Inpatient/ED visit) call.   Channing Larry, RN, BA, [facility], CRRN [facility] Coordinator, Transition of Care Ph # [phone]

## 2024-01-16 NOTE — Progress Notes (Signed)
 Paramedicine Encounter    Patient ID: Austin [name], male    DOB: 04-13-1956, 68 y.o.   MRN: [id number]   Complaints NONE  Assessment A&O x 4, skin W&D w/ good color. Denies chest pain or SOB.  Lung sounds clear and equal bilat and no peripheral edema.   Compliance with meds Missed several days due to being in hospital w/ hip fracture  Pill box filled x 1 week  Refills needed Folic acid  1mg   Meds changes since last visit Discontinued Entresto , Metoprolol , Spironolactone     Social changes none   BP 108/78 (BP Location: Left Arm, Patient Position: Sitting, Cuff Size: Normal)  Weight yesterday- Not taken Last visit weight-   Home visit with Mr. Kalb today following discharge from [facility] due to hip replacement surgery due to a mechanical fall on Fri 01/09/24.  Pt was diagnosed with adrenal insufficiency and started on new meds as noted in medication profile.  Reconciled med box x 1 week to reflect changes.  Entresto , Spironolactone  and Metoprolol  were held per discharge summary notes. Pt. Is starting Hydrocortisone  10mg  in the morning and 5mg  in the evening.  I stressed the importance in being compliant with these medications.  He and his wife verbalized they understand same.  I will follow up next week to check on med compliance. Pt denies chest pain or SOB.  Lung sounds clear and equal bilat and no peripheral edema noted. I advised pt should he have needs or concerns over the weekend to feel free to call EMS for evaluation and transport if needed.    ACTION: Home visit completed  Mary Claudene Kennel [phone] 01/16/24  Patient Care Team: Georgina Speaks, FNP as PCP - General (General Practice) Kate Lonni CROME, MD as PCP - Cardiology (Cardiology)  Patient Active Problem List   Diagnosis Date Noted   ABLA (acute blood loss anemia) 01/13/2024   Adrenal insufficiency (HCC) 01/12/2024   Closed left hip fracture (HCC) 01/10/2024   Transient  hypotension 06/05/2023   Alcohol  use disorder 06/05/2023   Hypertensive 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 (HCC) 06/05/2023   Elevated brain natriuretic peptide (BNP) level 06/05/2023   Elevated LFTs 05/18/2023   Prolonged QT interval 05/18/2023   Hyponatremia 05/17/2023   Iron deficiency anemia 09/03/2020   NICM (nonischemic cardiomyopathy) (HCC)    ETOH abuse 11/10/2018   Essential hypertension 11/10/2018   Blood in stool 11/10/2018    Current Outpatient Medications:    aspirin  (ASPIRIN  CHILDRENS) 81 MG chewable tablet, Chew 1 tablet (81 mg total) by mouth 2 (two) times daily with a meal., Disp: 90 tablet, Rfl: 0   cyanocobalamin  (VITAMIN B12) 500 MCG tablet, Take 1 tablet (500 mcg total) by mouth daily., Disp: 30 tablet, Rfl: 0   empagliflozin  (JARDIANCE ) 10 MG TABS tablet, Take 1 tablet (10 mg total) by mouth daily before breakfast., Disp: 90 tablet, Rfl: 3   ferrous sulfate  325 (65 FE) MG tablet, Take 1 tablet (325 mg total) by mouth daily with breakfast., Disp: 30 tablet, Rfl: 3   furosemide  (LASIX ) 40 MG tablet, Take 1 tablet (40 mg total) by mouth daily., Disp: 90 tablet, Rfl: 3   HYDROcodone -acetaminophen  (NORCO/VICODIN) 5-325 MG tablet, Take 1 tablet by mouth every 4 (four) hours as needed for up to 7 days for moderate pain (pain score 4-6) or severe pain (pain score 7-10)., Disp: 42 tablet, Rfl: 0   hydrocortisone  (CORTEF ) 10 MG tablet, Take 1 tablet (10 mg total)  by mouth daily. Take in the morning at 7 am., Disp: 30 tablet, Rfl: 0   hydrocortisone  (CORTEF ) 5 MG tablet, Take 1 tablet (5 mg total) by mouth daily in the afternoon. Take in the afternoon at 2 PM, Disp: 30 tablet, Rfl: 0   loperamide (IMODIUM) 2 MG capsule, Take 2 mg by mouth every 6 (six) hours as needed for diarrhea or loose stools., Disp: , Rfl:    Multiple Vitamin (MULTIVITAMIN) tablet, Take 1 tablet by mouth daily., Disp: , Rfl:    docusate sodium  (COLACE) 100 MG  capsule, Take 1 capsule (100 mg total) by mouth 2 (two) times daily., Disp: 10 capsule, Rfl: 0   folic acid  (FOLVITE ) 1 MG tablet, Take 1 tablet (1 mg total) by mouth daily., Disp: 30 tablet, Rfl: 0   senna (SENOKOT) 8.6 MG TABS tablet, Take 1 tablet (8.6 mg total) by mouth 2 (two) times daily. (Patient not taking: Reported on 01/16/2024), Disp: 120 tablet, Rfl: 0 Allergies  Allergen Reactions   Latex Swelling and Rash     Social History   Socioeconomic History   Marital status: Married    Spouse name: Not on file   Number of children: 2   Years of education: Not on file   Highest education level: Bachelor's degree (e.g., BA, AB, BS)  Occupational History   Not on file  Tobacco Use   Smoking status: Never   Smokeless tobacco: Never  Vaping Use   Vaping status: Never Used  Substance and Sexual Activity   Alcohol  use: Yes    Comment: 1-3 beers daily   Drug use: No   Sexual activity: Yes  Other Topics Concern   Not on file  Social History Narrative   Not on file   Social Drivers of Health   Financial Resource Strain: Low Risk  (02/27/2022)   Overall Financial Resource Strain (CARDIA)    Difficulty of Paying Living Expenses: Not hard at all  Food Insecurity: No Food Insecurity (01/11/2024)   Hunger Vital Sign    Worried About Running Out of Food in the Last Year: Never true    Ran Out of Food in the Last Year: Never true  Transportation Needs: No Transportation Needs (01/11/2024)   PRAPARE - Administrator, Civil Service (Medical): No    Lack of Transportation (Non-Medical): No  Physical Activity: Inactive (02/27/2022)   Exercise Vital Sign    Days of Exercise per Week: 0 days    Minutes of Exercise per Session: 0 min  Stress: No Stress Concern Present (02/27/2022)   [employer] of Occupational Health - Occupational Stress Questionnaire    Feeling of Stress : Only a little  Social Connections: Socially Integrated (01/11/2024)   Social Connection and Isolation  Panel [NHANES]    Frequency of Communication with Friends and Family: More than three times a week    Frequency of Social Gatherings with Friends and Family: More than three times a week    Attends Religious Services: More than 4 times per year    Active Member of [employer] or Organizations: Yes    Attends Banker Meetings: More than 4 times per year    Marital Status: Married  Catering Manager Violence: Not At Risk (01/11/2024)   Humiliation, Afraid, Rape, and Kick questionnaire    Fear of Current or Ex-Partner: No    Emotionally Abused: No    Physically Abused: No    Sexually Abused: No    Physical Exam  Future Appointments  Date Time Provider Department Center  01/28/2024  1:30 PM [facility] PA/NP [facility] None

## 2024-01-19 ENCOUNTER — Telehealth: Payer: Self-pay | Admitting: *Deleted

## 2024-01-19 NOTE — Transitions of Care (Post Inpatient/ED Visit) (Signed)
 01/19/2024  Name: Austin LOUDIN MRN: [id number] DOB: January 07, 1956  Today's TOC FU Call Status: Today's TOC FU Call Status:: Successful TOC FU Call Completed TOC FU Call Complete Date: 01/19/24 Patient's Name and Date of Birth confirmed.  Transition Care Management Follow-up Telephone Call Date of Discharge: 01/15/24 Discharge Facility: [facility]) Type of Discharge: Inpatient Admission Primary Inpatient Discharge Diagnosis:: Closed left hip fracture How have you been since you were released from the hospital?: Better Any questions or concerns?: Yes Patient Questions/Concerns:: Patient stated he did not get an elevated potty chair Patient Questions/Concerns Addressed: Other: (RN explained to patient to call the ortho and ask to have an order sent in. RN discussed that it would be according to if his benefits covered it.)  Items Reviewed: Did you receive and understand the discharge instructions provided?: Yes Medications obtained,verified, and reconciled?: Yes (Medications Reviewed) Any new allergies since your discharge?: No Dietary orders reviewed?: No Do you have support at home?: Yes People in Home: spouse Name of Support/Comfort Primary Source: Lorie and sons  Medications Reviewed Today: Medications Reviewed Today     Reviewed by Eilene Grater, RN (Case Manager) on 01/19/24 at 1107  Med List Status: <None>   Medication Order Taking? Sig Documenting Provider Last Dose Status Informant  aspirin  (ASPIRIN  CHILDRENS) 81 MG chewable tablet [id number] Yes Chew 1 tablet (81 mg total) by mouth 2 (two) times daily with a meal. Hill, Philippa Bray, PA-C Taking Active   cyanocobalamin  (VITAMIN B12) 500 MCG tablet [id number] Yes Take 1 tablet (500 mcg total) by mouth daily. Regalado, Belkys A, MD Taking Active   docusate sodium  (COLACE) 100 MG capsule [id number] Yes Take 1 capsule (100 mg total) by mouth 2 (two) times daily. Regalado, Brigido Canales, MD Taking Active   empagliflozin   (JARDIANCE ) 10 MG TABS tablet [id number] Yes Take 1 tablet (10 mg total) by mouth daily before breakfast. Darlis Eisenmenger, MD Taking Active Self  ferrous sulfate  325 (65 FE) MG tablet [id number] Yes Take 1 tablet (325 mg total) by mouth daily with breakfast. Regalado, Belkys A, MD Taking Active   folic acid  (FOLVITE ) 1 MG tablet [id number] Yes Take 1 tablet (1 mg total) by mouth daily. Regalado, Brigido Canales, MD Taking Active   furosemide  (LASIX ) 40 MG tablet [id number] Yes Take 1 tablet (40 mg total) by mouth daily. Lee, [place], NP Taking Active Self  hydrocortisone  (CORTEF ) 10 MG tablet [id number] Yes Take 1 tablet (10 mg total) by mouth daily. Take in the morning at 7 am. Regalado, Belkys A, MD Taking Active   hydrocortisone  (CORTEF ) 5 MG tablet [id number] Yes Take 1 tablet (5 mg total) by mouth daily in the afternoon. Take in the afternoon at 2 PM Regalado, Belkys A, MD Taking Active   loperamide (IMODIUM) 2 MG capsule [id number] Yes Take 2 mg by mouth every 6 (six) hours as needed for diarrhea or loose stools. [provider] Taking Active Self           Med Note Felipe Horton, DEDE   Fri Jan 16, 2024  3:01 PM) prn  Multiple Vitamin (MULTIVITAMIN) tablet [id number] Yes Take 1 tablet by mouth daily. [provider] Taking Active Self           Med Note Felipe Horton, DEDE   Wed Jun 25, 2023 11:59 AM)    senna (SENOKOT) 8.6 MG TABS tablet [id number] No Take 1 tablet (8.6 mg total) by mouth 2 (two) times daily.  Patient not taking: Reported on 01/19/2024  Regalado, Belkys A, MD Not Taking Active             Home Care and Equipment/Supplies: Were Home Health Services Ordered?: Yes Name of Home Health Agency:: [facility] Has Agency set up a time to come to your home?: No EMR reviewed for Home Health Orders: Orders present/patient has not received call (refer to CM for follow-up) (Patient understands that Gasper Karst will call him. RN gave the patient Gasper Karst phione number to call and follow up.) Any new  equipment or medical supplies ordered?: NA (Patient wanted BSC but it was not ordered. RN instructed patient on what to do.)  Functional Questionnaire: Do you need assistance with bathing/showering or dressing?: Yes Do you need assistance with meal preparation?: Yes Do you need assistance with eating?: No Do you have difficulty maintaining continence: No Do you need assistance with getting out of bed/getting out of a chair/moving?: Yes Do you have difficulty managing or taking your medications?: No  Follow up appointments reviewed: PCP Follow-up appointment confirmed?: NA Specialist Hospital Follow-up appointment confirmed?: No Reason Specialist Follow-Up Not Confirmed: Patient has Specialist Provider Number and will Call for Appointment (Ortho office is to follow up with appt) Do you need transportation to your follow-up appointment?: No Do you understand care options if your condition(s) worsen?: Yes-patient verbalized understanding  SDOH Interventions Today    Flowsheet Row Most Recent Value  SDOH Interventions   Food Insecurity Interventions Intervention Not Indicated  Housing Interventions Intervention Not Indicated  Transportation Interventions Intervention Not Indicated, Patient Resources (Friends/Family)  Utilities Interventions Intervention Not Indicated      Interventions Today    Flowsheet Row Most Recent Value  Chronic Disease   Chronic disease during today's visit Other  [Closed left hip fracture]  General Interventions   General Interventions Discussed/Reviewed General Interventions Discussed, General Interventions Reviewed, Durable Medical Equipment (DME), Doctor Visits  Doctor Visits Discussed/Reviewed Doctor Visits Discussed, Doctor Visits Reviewed, Specialist  Durable Medical Equipment (DME) Bed side commode  [RN instructed patient to call ortho and let them know he would like to have a BSC called in.]  PCP/Specialist Visits Compliance with follow-up visit   Exercise Interventions   Exercise Discussed/Reviewed Exercise Discussed  Gasper Karst is to start PT]  Pharmacy Interventions   Pharmacy Dicussed/Reviewed Pharmacy Topics Discussed, Pharmacy Topics Reviewed      Una Ganser BSN RN [facility] Health Care Management Coordinator [email or web address] Direct Dial: [phone]  Fax: [phone] Website: [email or web address]

## 2024-01-20 ENCOUNTER — Other Ambulatory Visit (HOSPITAL_COMMUNITY): Payer: Medicare Other

## 2024-01-20 DIAGNOSIS — I110 Hypertensive heart disease with heart failure: Secondary | ICD-10-CM | POA: Diagnosis not present

## 2024-01-20 DIAGNOSIS — Z96642 Presence of left artificial hip joint: Secondary | ICD-10-CM | POA: Diagnosis not present

## 2024-01-20 DIAGNOSIS — I509 Heart failure, unspecified: Secondary | ICD-10-CM | POA: Diagnosis not present

## 2024-01-20 DIAGNOSIS — S72002D Fracture of unspecified part of neck of left femur, subsequent encounter for closed fracture with routine healing: Secondary | ICD-10-CM | POA: Diagnosis not present

## 2024-01-20 DIAGNOSIS — M19041 Primary osteoarthritis, right hand: Secondary | ICD-10-CM | POA: Diagnosis not present

## 2024-01-22 ENCOUNTER — Other Ambulatory Visit (HOSPITAL_COMMUNITY): Payer: Self-pay | Admitting: Emergency Medicine

## 2024-01-22 NOTE — Progress Notes (Signed)
Paramedicine Encounter    Patient ID: Austin [name], male    DOB: 06/16/1956, 68 y.o.   MRN: [id number]   Complaints NONE  Assessment A&O x 4, skin W&D w/ good color.  Denies chest pain or SOB.  Lung sounds clear throughout.  No peripheral edema noted.  Compliance with meds YES  Pill box filled x 1 week  Refills needed NONE  Meds changes since last visit NONE    Social changes NONE   BP 110/80 (BP Location: Right Arm, Patient Position: Standing, Cuff Size: Normal)   Pulse (!) 104   Resp 16   Wt 145 lb (65.8 kg)   SpO2 94%   BMI 21.41 kg/m  Weight yesterday- not taken Last visit weight-146lb   ATF Austin [name] ambulating w/ assistance of a walker. He reports to be doing well post hip replacement.  He has done well with med compliance.  Pill box reconciled x 1 week.  He has not yet picked up Folic Acid 2/19 @ 1:30.  I will attend this visit with him and reminded him to bring his medications and pill box.  ACTION: Home visit completed  Bethanie Dicker [phone] 01/22/24  Patient Care Team: [name], FNP as PCP - General (General Practice) Little Ishikawa, MD as PCP - Cardiology (Cardiology)  Patient Active Problem List   Diagnosis Date Noted   ABLA (acute blood loss anemia) 01/13/2024   Adrenal insufficiency (HCC) 01/12/2024   Closed left hip fracture (HCC) 01/10/2024   Transient hypotension 06/05/2023   Alcohol use disorder 06/05/2023   Hypertensive 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 (HCC) 06/05/2023   Elevated brain natriuretic peptide (BNP) level 06/05/2023   Elevated LFTs 05/18/2023   Prolonged QT interval 05/18/2023   Hyponatremia 05/17/2023   Iron deficiency anemia 09/03/2020   NICM (nonischemic cardiomyopathy) (HCC)    ETOH abuse 11/10/2018   Essential hypertension 11/10/2018   Blood in stool 11/10/2018    Current Outpatient Medications:    aspirin (ASPIRIN CHILDRENS) 81 MG  chewable tablet, Chew 1 tablet (81 mg total) by mouth 2 (two) times daily with a meal., Disp: 90 tablet, Rfl: 0   cyanocobalamin (VITAMIN B12) 500 MCG tablet, Take 1 tablet (500 mcg total) by mouth daily., Disp: 30 tablet, Rfl: 0   docusate sodium (COLACE) 100 MG capsule, Take 1 capsule (100 mg total) by mouth 2 (two) times daily., Disp: 10 capsule, Rfl: 0   empagliflozin (JARDIANCE) 10 MG TABS tablet, Take 1 tablet (10 mg total) by mouth daily before breakfast., Disp: 90 tablet, Rfl: 3   ferrous sulfate 325 (65 FE) MG tablet, Take 1 tablet (325 mg total) by mouth daily with breakfast., Disp: 30 tablet, Rfl: 3   furosemide (LASIX) 40 MG tablet, Take 1 tablet (40 mg total) by mouth daily., Disp: 90 tablet, Rfl: 3   hydrocortisone (CORTEF) 10 MG tablet, Take 1 tablet (10 mg total) by mouth daily. Take in the morning at 7 am., Disp: 30 tablet, Rfl: 0   hydrocortisone (CORTEF) 5 MG tablet, Take 1 tablet (5 mg total) by mouth daily in the afternoon. Take in the afternoon at 2 PM, Disp: 30 tablet, Rfl: 0   loperamide (IMODIUM) 2 MG capsule, Take 2 mg by mouth every 6 (six) hours as needed for diarrhea or loose stools., Disp: , Rfl:    Multiple Vitamin (MULTIVITAMIN) tablet, Take 1 tablet by mouth daily., Disp: , Rfl:    folic acid (FOLVITE) 1  MG tablet, Take 1 tablet (1 mg total) by mouth daily., Disp: 30 tablet, Rfl: 0   senna (SENOKOT) 8.6 MG TABS tablet, Take 1 tablet (8.6 mg total) by mouth 2 (two) times daily. (Patient not taking: Reported on 01/16/2024), Disp: 120 tablet, Rfl: 0 Allergies  Allergen Reactions   Latex Swelling and Rash     Social History   Socioeconomic History   Marital status: Married    Spouse name: Not on file   Number of children: 2   Years of education: Not on file   Highest education level: Bachelor's degree (e.g., BA, AB, BS)  Occupational History   Not on file  Tobacco Use   Smoking status: Never   Smokeless tobacco: Never  Vaping Use   Vaping status: Never Used   Substance and Sexual Activity   Alcohol use: Yes    Comment: 1-3 beers daily   Drug use: No   Sexual activity: Yes  Other Topics Concern   Not on file  Social History Narrative   Not on file   Social Drivers of Health   Financial Resource Strain: Low Risk  (02/27/2022)   Overall Financial Resource Strain (CARDIA)    Difficulty of Paying Living Expenses: Not hard at all  Food Insecurity: No Food Insecurity (01/19/2024)   Hunger Vital Sign    Worried About Running Out of Food in the Last Year: Never true    Ran Out of Food in the Last Year: Never true  Transportation Needs: No Transportation Needs (01/19/2024)   PRAPARE - Administrator, Civil Service (Medical): No    Lack of Transportation (Non-Medical): No  Physical Activity: Inactive (02/27/2022)   Exercise Vital Sign    Days of Exercise per Week: 0 days    Minutes of Exercise per Session: 0 min  Stress: No Stress Concern Present (02/27/2022)   [employer] of Occupational Health - Occupational Stress Questionnaire    Feeling of Stress : Only a little  Social Connections: Socially Integrated (01/11/2024)   Social Connection and Isolation Panel [NHANES]    Frequency of Communication with Friends and Family: More than three times a week    Frequency of Social Gatherings with Friends and Family: More than three times a week    Attends Religious Services: More than 4 times per year    Active Member of Clubs or Organizations: Yes    Attends Banker Meetings: More than 4 times per year    Marital Status: Married  Catering manager Violence: Not At Risk (01/19/2024)   Humiliation, Afraid, Rape, and Kick questionnaire    Fear of Current or Ex-Partner: No    Emotionally Abused: No    Physically Abused: No    Sexually Abused: No    Physical Exam      Future Appointments  Date Time Provider Department Center  01/28/2024  1:30 PM [facility] PA/NP [facility] None

## 2024-01-23 DIAGNOSIS — I509 Heart failure, unspecified: Secondary | ICD-10-CM | POA: Diagnosis not present

## 2024-01-23 DIAGNOSIS — S72002D Fracture of unspecified part of neck of left femur, subsequent encounter for closed fracture with routine healing: Secondary | ICD-10-CM | POA: Diagnosis not present

## 2024-01-23 DIAGNOSIS — Z96642 Presence of left artificial hip joint: Secondary | ICD-10-CM | POA: Diagnosis not present

## 2024-01-23 DIAGNOSIS — M19041 Primary osteoarthritis, right hand: Secondary | ICD-10-CM | POA: Diagnosis not present

## 2024-01-23 DIAGNOSIS — I110 Hypertensive heart disease with heart failure: Secondary | ICD-10-CM | POA: Diagnosis not present

## 2024-01-26 DIAGNOSIS — Z96642 Presence of left artificial hip joint: Secondary | ICD-10-CM | POA: Diagnosis not present

## 2024-01-26 DIAGNOSIS — Z471 Aftercare following joint replacement surgery: Secondary | ICD-10-CM | POA: Diagnosis not present

## 2024-01-27 DIAGNOSIS — I509 Heart failure, unspecified: Secondary | ICD-10-CM | POA: Diagnosis not present

## 2024-01-27 DIAGNOSIS — M19041 Primary osteoarthritis, right hand: Secondary | ICD-10-CM | POA: Diagnosis not present

## 2024-01-27 DIAGNOSIS — S72002D Fracture of unspecified part of neck of left femur, subsequent encounter for closed fracture with routine healing: Secondary | ICD-10-CM | POA: Diagnosis not present

## 2024-01-27 DIAGNOSIS — I110 Hypertensive heart disease with heart failure: Secondary | ICD-10-CM | POA: Diagnosis not present

## 2024-01-27 DIAGNOSIS — Z96642 Presence of left artificial hip joint: Secondary | ICD-10-CM | POA: Diagnosis not present

## 2024-01-28 ENCOUNTER — Other Ambulatory Visit (HOSPITAL_COMMUNITY): Payer: Self-pay | Admitting: Emergency Medicine

## 2024-01-28 ENCOUNTER — Encounter (HOSPITAL_COMMUNITY): Payer: Medicare Other

## 2024-01-28 NOTE — Progress Notes (Unsigned)
Paramedicine Encounter    Patient ID: Austin [name], male    DOB: 1956/05/14, 68 y.o.   MRN: [id number]   Complaints NONE  Assessment A&O x 4, skin W&D w/ good color.  Denies chest pain or SOB.  Lung sounds clear throughout and no peripheral edema noted  Compliance with meds YES  Pill box filled x 1 week  Refills needed Needs to p/u Folivite prescription  Meds changes since last visit NONE    Social changes NONE   BP 120/78 (BP Location: Left Arm, Patient Position: Sitting, Cuff Size: Normal)   Pulse (!) 105   Resp 16   Wt 147 lb (66.7 kg)   SpO2 98%   BMI 21.71 kg/m  Weight yesterday- not taken Last visit weight-146lb  Home visit today finds Austin [name] w/ odor of ETOH present and he admits to consuming beer today. Once again discussed the importance of stopping his drinking and maybe looking into non-alcoholic beer choices. He has been compliance with all meds.  Pill box reconciled x 1 week.  His wife still needs to pick up his Folivite from [facility] so that this can be reconciled into his regimen.  Also continued to stress the importance of not missing doses of his Hydrocortisone meds w/ his recent diagnosis of adrenal insufficieny. Next home visit 2/26 @ 12:30/  ACTION: Home visit completed  Bethanie Dicker [phone] 01/28/24  Patient Care Team: [name], FNP as PCP - General (General Practice) Little Ishikawa, MD as PCP - Cardiology (Cardiology)  Patient Active Problem List   Diagnosis Date Noted   ABLA (acute blood loss anemia) 01/13/2024   Adrenal insufficiency (HCC) 01/12/2024   Closed left hip fracture (HCC) 01/10/2024   Transient hypotension 06/05/2023   Alcohol use disorder 06/05/2023   Hypertensive 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 (HCC) 06/05/2023   Elevated brain natriuretic peptide (BNP) level 06/05/2023   Elevated LFTs 05/18/2023   Prolonged QT interval 05/18/2023    Hyponatremia 05/17/2023   Iron deficiency anemia 09/03/2020   NICM (nonischemic cardiomyopathy) (HCC)    ETOH abuse 11/10/2018   Essential hypertension 11/10/2018   Blood in stool 11/10/2018    Current Outpatient Medications:    aspirin (ASPIRIN CHILDRENS) 81 MG chewable tablet, Chew 1 tablet (81 mg total) by mouth 2 (two) times daily with a meal., Disp: 90 tablet, Rfl: 0   cyanocobalamin (VITAMIN B12) 500 MCG tablet, Take 1 tablet (500 mcg total) by mouth daily., Disp: 30 tablet, Rfl: 0   docusate sodium (COLACE) 100 MG capsule, Take 1 capsule (100 mg total) by mouth 2 (two) times daily., Disp: 10 capsule, Rfl: 0   empagliflozin (JARDIANCE) 10 MG TABS tablet, Take 1 tablet (10 mg total) by mouth daily before breakfast., Disp: 90 tablet, Rfl: 3   ferrous sulfate 325 (65 FE) MG tablet, Take 1 tablet (325 mg total) by mouth daily with breakfast., Disp: 30 tablet, Rfl: 3   furosemide (LASIX) 40 MG tablet, Take 1 tablet (40 mg total) by mouth daily., Disp: 90 tablet, Rfl: 3   hydrocortisone (CORTEF) 10 MG tablet, Take 1 tablet (10 mg total) by mouth daily. Take in the morning at 7 am., Disp: 30 tablet, Rfl: 0   hydrocortisone (CORTEF) 5 MG tablet, Take 1 tablet (5 mg total) by mouth daily in the afternoon. Take in the afternoon at 2 PM, Disp: 30 tablet, Rfl: 0   loperamide (IMODIUM) 2 MG capsule, Take 2 mg by mouth  every 6 (six) hours as needed for diarrhea or loose stools., Disp: , Rfl:    Multiple Vitamin (MULTIVITAMIN) tablet, Take 1 tablet by mouth daily., Disp: , Rfl:    folic acid (FOLVITE) 1 MG tablet, Take 1 tablet (1 mg total) by mouth daily. (Patient not taking: Reported on 01/28/2024), Disp: 30 tablet, Rfl: 0   senna (SENOKOT) 8.6 MG TABS tablet, Take 1 tablet (8.6 mg total) by mouth 2 (two) times daily. (Patient not taking: Reported on 01/28/2024), Disp: 120 tablet, Rfl: 0 Allergies  Allergen Reactions   Latex Swelling and Rash     Social History   Socioeconomic History   Marital  status: Married    Spouse name: Not on file   Number of children: 2   Years of education: Not on file   Highest education level: Bachelor's degree (e.g., BA, AB, BS)  Occupational History   Not on file  Tobacco Use   Smoking status: Never   Smokeless tobacco: Never  Vaping Use   Vaping status: Never Used  Substance and Sexual Activity   Alcohol use: Yes    Comment: 1-3 beers daily   Drug use: No   Sexual activity: Yes  Other Topics Concern   Not on file  Social History Narrative   Not on file   Social Drivers of Health   Financial Resource Strain: Low Risk  (02/27/2022)   Overall Financial Resource Strain (CARDIA)    Difficulty of Paying Living Expenses: Not hard at all  Food Insecurity: No Food Insecurity (01/19/2024)   Hunger Vital Sign    Worried About Running Out of Food in the Last Year: Never true    Ran Out of Food in the Last Year: Never true  Transportation Needs: No Transportation Needs (01/19/2024)   PRAPARE - Administrator, Civil Service (Medical): No    Lack of Transportation (Non-Medical): No  Physical Activity: Inactive (02/27/2022)   Exercise Vital Sign    Days of Exercise per Week: 0 days    Minutes of Exercise per Session: 0 min  Stress: No Stress Concern Present (02/27/2022)   [employer] of Occupational Health - Occupational Stress Questionnaire    Feeling of Stress : Only a little  Social Connections: Socially Integrated (01/11/2024)   Social Connection and Isolation Panel [NHANES]    Frequency of Communication with Friends and Family: More than three times a week    Frequency of Social Gatherings with Friends and Family: More than three times a week    Attends Religious Services: More than 4 times per year    Active Member of Clubs or Organizations: Yes    Attends Banker Meetings: More than 4 times per year    Marital Status: Married  Catering manager Violence: Not At Risk (01/19/2024)   Humiliation, Afraid, Rape, and  Kick questionnaire    Fear of Current or Ex-Partner: No    Emotionally Abused: No    Physically Abused: No    Sexually Abused: No    Physical Exam      Future Appointments  Date Time Provider Department Center  02/13/2024 11:00 AM [facility] PA/NP [facility] None

## 2024-01-30 DIAGNOSIS — M19041 Primary osteoarthritis, right hand: Secondary | ICD-10-CM | POA: Diagnosis not present

## 2024-01-30 DIAGNOSIS — I509 Heart failure, unspecified: Secondary | ICD-10-CM | POA: Diagnosis not present

## 2024-01-30 DIAGNOSIS — Z96642 Presence of left artificial hip joint: Secondary | ICD-10-CM | POA: Diagnosis not present

## 2024-01-30 DIAGNOSIS — I110 Hypertensive heart disease with heart failure: Secondary | ICD-10-CM | POA: Diagnosis not present

## 2024-01-30 DIAGNOSIS — S72002D Fracture of unspecified part of neck of left femur, subsequent encounter for closed fracture with routine healing: Secondary | ICD-10-CM | POA: Diagnosis not present

## 2024-02-03 DIAGNOSIS — I509 Heart failure, unspecified: Secondary | ICD-10-CM | POA: Diagnosis not present

## 2024-02-03 DIAGNOSIS — M19041 Primary osteoarthritis, right hand: Secondary | ICD-10-CM | POA: Diagnosis not present

## 2024-02-03 DIAGNOSIS — I110 Hypertensive heart disease with heart failure: Secondary | ICD-10-CM | POA: Diagnosis not present

## 2024-02-03 DIAGNOSIS — S72002D Fracture of unspecified part of neck of left femur, subsequent encounter for closed fracture with routine healing: Secondary | ICD-10-CM | POA: Diagnosis not present

## 2024-02-03 DIAGNOSIS — Z96642 Presence of left artificial hip joint: Secondary | ICD-10-CM | POA: Diagnosis not present

## 2024-02-04 ENCOUNTER — Telehealth (HOSPITAL_COMMUNITY): Payer: Self-pay | Admitting: Emergency Medicine

## 2024-02-04 NOTE — Telephone Encounter (Signed)
 Called Mrs. Lengacher to let her know I was on my way for home visit.  She advises that physical therapy worked Mr. Milne really hard yesterday and that he's been in a lot of hip pain and is now sleeping and does not wish me to come today.  I will  see him tomorrow @ 3/27 @ 9:00.    Beatrix Shipper, EMT-Paramedic [phone] 02/04/2024

## 2024-02-05 ENCOUNTER — Other Ambulatory Visit (HOSPITAL_COMMUNITY): Payer: Self-pay | Admitting: Emergency Medicine

## 2024-02-05 NOTE — Progress Notes (Signed)
 Paramedicine Encounter    Patient ID: Austin [name], male    DOB: Jul 31, 1956, 68 y.o.   MRN: [id number]   Complaints NONE  Assessment A&O x 4, skin W&D w/ good color.  Pt denies chest pain or SOB.  Lung  Compliance with meds Missed Sat a.m. and p.m. dose  Pill box filled x 1 week  Refills needed Folic Acid ( still needs to pick up from pharmacy)  Meds changes since last visit NONE    Social changes NONE   BP 120/70 (BP Location: Left Arm, Patient Position: Sitting, Cuff Size: Normal)   Pulse (!) 104   Resp 16   Wt 146 lb (66.2 kg)   SpO2 98%   BMI 21.56 kg/m  Weight yesterday- not taken Last visit weight-145lb  ATF Austin [name] ambulating in residence w/ assistance of a cane.  He has been rehabing at home from his left hip replacement surgery and states he is doing well.   Pt A&O x 4, skin W&D w/ good color.  Strong odor of ETOH on his breath but he denies drinking same.  He denies chest pain or SOB.  Lung sounds clear and equal bilat.  No peripheral edema noted. Pt. Missed Sat morning and evening dose of his meds.  Continue to stress the importance of med compliance especially w/ new diagnosis of adrenal insufficiency and taking Hydrocortisone in morning and evening.   Med box reconciled  x 1 week.   Next home visit 02/09/24 @ 9:00.  Reminded him he also has an appointment w/ [facility] @ 11:00 02/13/24.  ACTION: Home visit completed  Bethanie Dicker [phone] 02/05/24  Patient Care Team: [name], FNP as PCP - General (General Practice) Little Ishikawa, MD as PCP - Cardiology (Cardiology)  Patient Active Problem List   Diagnosis Date Noted   ABLA (acute blood loss anemia) 01/13/2024   Adrenal insufficiency (HCC) 01/12/2024   Closed left hip fracture (HCC) 01/10/2024   Transient hypotension 06/05/2023   Alcohol use disorder 06/05/2023   Hypertensive 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 (HCC)  06/05/2023   Elevated brain natriuretic peptide (BNP) level 06/05/2023   Elevated LFTs 05/18/2023   Prolonged QT interval 05/18/2023   Hyponatremia 05/17/2023   Iron deficiency anemia 09/03/2020   NICM (nonischemic cardiomyopathy) (HCC)    ETOH abuse 11/10/2018   Essential hypertension 11/10/2018   Blood in stool 11/10/2018    Current Outpatient Medications:    aspirin (ASPIRIN CHILDRENS) 81 MG chewable tablet, Chew 1 tablet (81 mg total) by mouth 2 (two) times daily with a meal., Disp: 90 tablet, Rfl: 0   cyanocobalamin (VITAMIN B12) 500 MCG tablet, Take 1 tablet (500 mcg total) by mouth daily., Disp: 30 tablet, Rfl: 0   docusate sodium (COLACE) 100 MG capsule, Take 1 capsule (100 mg total) by mouth 2 (two) times daily., Disp: 10 capsule, Rfl: 0   empagliflozin (JARDIANCE) 10 MG TABS tablet, Take 1 tablet (10 mg total) by mouth daily before breakfast., Disp: 90 tablet, Rfl: 3   ferrous sulfate 325 (65 FE) MG tablet, Take 1 tablet (325 mg total) by mouth daily with breakfast., Disp: 30 tablet, Rfl: 3   furosemide (LASIX) 40 MG tablet, Take 1 tablet (40 mg total) by mouth daily., Disp: 90 tablet, Rfl: 3   hydrocortisone (CORTEF) 10 MG tablet, Take 1 tablet (10 mg total) by mouth daily. Take in the morning at 7 am., Disp: 30 tablet, Rfl: 0  hydrocortisone (CORTEF) 5 MG tablet, Take 1 tablet (5 mg total) by mouth daily in the afternoon. Take in the afternoon at 2 PM, Disp: 30 tablet, Rfl: 0   loperamide (IMODIUM) 2 MG capsule, Take 2 mg by mouth every 6 (six) hours as needed for diarrhea or loose stools., Disp: , Rfl:    Multiple Vitamin (MULTIVITAMIN) tablet, Take 1 tablet by mouth daily., Disp: , Rfl:    folic acid (FOLVITE) 1 MG tablet, Take 1 tablet (1 mg total) by mouth daily. (Patient not taking: Reported on 02/05/2024), Disp: 30 tablet, Rfl: 0   senna (SENOKOT) 8.6 MG TABS tablet, Take 1 tablet (8.6 mg total) by mouth 2 (two) times daily. (Patient not taking: Reported on 01/16/2024), Disp:  120 tablet, Rfl: 0 Allergies  Allergen Reactions   Latex Swelling and Rash     Social History   Socioeconomic History   Marital status: Married    Spouse name: Not on file   Number of children: 2   Years of education: Not on file   Highest education level: Bachelor's degree (e.g., BA, AB, BS)  Occupational History   Not on file  Tobacco Use   Smoking status: Never   Smokeless tobacco: Never  Vaping Use   Vaping status: Never Used  Substance and Sexual Activity   Alcohol use: Yes    Comment: 1-3 beers daily   Drug use: No   Sexual activity: Yes  Other Topics Concern   Not on file  Social History Narrative   Not on file   Social Drivers of Health   Financial Resource Strain: Low Risk  (02/27/2022)   Overall Financial Resource Strain (CARDIA)    Difficulty of Paying Living Expenses: Not hard at all  Food Insecurity: No Food Insecurity (01/19/2024)   Hunger Vital Sign    Worried About Running Out of Food in the Last Year: Never true    Ran Out of Food in the Last Year: Never true  Transportation Needs: No Transportation Needs (01/19/2024)   PRAPARE - Administrator, Civil Service (Medical): No    Lack of Transportation (Non-Medical): No  Physical Activity: Inactive (02/27/2022)   Exercise Vital Sign    Days of Exercise per Week: 0 days    Minutes of Exercise per Session: 0 min  Stress: No Stress Concern Present (02/27/2022)   [employer] of Occupational Health - Occupational Stress Questionnaire    Feeling of Stress : Only a little  Social Connections: Socially Integrated (01/11/2024)   Social Connection and Isolation Panel [NHANES]    Frequency of Communication with Friends and Family: More than three times a week    Frequency of Social Gatherings with Friends and Family: More than three times a week    Attends Religious Services: More than 4 times per year    Active Member of Clubs or Organizations: Yes    Attends Banker Meetings: More  than 4 times per year    Marital Status: Married  Catering manager Violence: Not At Risk (01/19/2024)   Humiliation, Afraid, Rape, and Kick questionnaire    Fear of Current or Ex-Partner: No    Emotionally Abused: No    Physically Abused: No    Sexually Abused: No    Physical Exam      Future Appointments  Date Time Provider Department Center  02/13/2024 11:00 AM [facility] PA/NP [facility] None

## 2024-02-06 DIAGNOSIS — S72002D Fracture of unspecified part of neck of left femur, subsequent encounter for closed fracture with routine healing: Secondary | ICD-10-CM | POA: Diagnosis not present

## 2024-02-06 DIAGNOSIS — M19041 Primary osteoarthritis, right hand: Secondary | ICD-10-CM | POA: Diagnosis not present

## 2024-02-06 DIAGNOSIS — I110 Hypertensive heart disease with heart failure: Secondary | ICD-10-CM | POA: Diagnosis not present

## 2024-02-06 DIAGNOSIS — Z96642 Presence of left artificial hip joint: Secondary | ICD-10-CM | POA: Diagnosis not present

## 2024-02-06 DIAGNOSIS — I509 Heart failure, unspecified: Secondary | ICD-10-CM | POA: Diagnosis not present

## 2024-02-09 ENCOUNTER — Other Ambulatory Visit (HOSPITAL_COMMUNITY): Payer: Self-pay | Admitting: Emergency Medicine

## 2024-02-09 NOTE — Progress Notes (Signed)
 Today's visit is med rec only due to pt and his wife both have vomiting and diarrhea.  There has been a stomach bug going round. Reconciliation done in a separate room for isolation precautions.  Filled pill box x 1 week.  Refills called in for both doses of Hydrocortisone 10mg  and 5mg  doses.  Has enough for this reconciliation.

## 2024-02-10 ENCOUNTER — Telehealth (HOSPITAL_COMMUNITY): Payer: Self-pay | Admitting: Cardiology

## 2024-02-10 DIAGNOSIS — M19041 Primary osteoarthritis, right hand: Secondary | ICD-10-CM | POA: Diagnosis not present

## 2024-02-10 DIAGNOSIS — S72002D Fracture of unspecified part of neck of left femur, subsequent encounter for closed fracture with routine healing: Secondary | ICD-10-CM | POA: Diagnosis not present

## 2024-02-10 DIAGNOSIS — Z96642 Presence of left artificial hip joint: Secondary | ICD-10-CM | POA: Diagnosis not present

## 2024-02-10 DIAGNOSIS — I509 Heart failure, unspecified: Secondary | ICD-10-CM | POA: Diagnosis not present

## 2024-02-10 DIAGNOSIS — I110 Hypertensive heart disease with heart failure: Secondary | ICD-10-CM | POA: Diagnosis not present

## 2024-02-10 NOTE — Telephone Encounter (Signed)
 ORDER added to upcoming appt notes

## 2024-02-10 NOTE — Telephone Encounter (Signed)
 PT with Austin [name] [phone]  Called to report elevated HR before therapy/exercise of 120 Wanted to notify provider since metoprolol was discontinued at discharge 2/6   Note pt has f/u 3/7   Message sent as [facility]

## 2024-02-11 ENCOUNTER — Ambulatory Visit (INDEPENDENT_AMBULATORY_CARE_PROVIDER_SITE_OTHER)

## 2024-02-11 DIAGNOSIS — Z0000 Encounter for general adult medical examination without abnormal findings: Secondary | ICD-10-CM | POA: Diagnosis not present

## 2024-02-11 NOTE — Patient Instructions (Addendum)
 Austin [name] , Thank you for taking time to come for your Medicare Wellness Visit. I appreciate your ongoing commitment to your health goals. Please review the following plan we discussed and let me know if I can assist you in the future.   Referrals/Orders/Follow-Ups/Clinician Recommendations: none  This is a list of the screening recommended for you and due dates:  Health Maintenance  Topic Date Due   Pneumonia Vaccine (1 of 2 - PCV) Never done   Flu Shot  07/10/2023   COVID-19 Vaccine (4 - 2024-25 season) 08/10/2023   DTaP/Tdap/Td vaccine (2 - Td or Tdap) 04/25/2024   Colon Cancer Screening  02/08/2025   Medicare Annual Wellness Visit  02/10/2025   Hepatitis C Screening  Completed   Zoster (Shingles) Vaccine  Completed   HPV Vaccine  Aged Out    Advanced directives: (ACP Link)Information on Advanced Care Planning can be found at [facility] [place] [facility] Advance Health Care Directives Advance Health Care Directives ([email or web address]   Next Medicare Annual Wellness Visit scheduled for next year: Yes  insert Preventive Care attachment Insert FALL PREVENTION attachment if needed

## 2024-02-11 NOTE — Progress Notes (Signed)
 Subjective:   Austin [name] is a 68 y.o. who presents for a Medicare Wellness preventive visit.  Visit Complete: Virtual I connected with  Austin [name] on 02/11/24 by a audio enabled telemedicine application and verified that I am speaking with the correct person using two identifiers.  Patient Location: Home  Provider Location: Office/Clinic  I discussed the limitations of evaluation and management by telemedicine. The patient expressed understanding and agreed to proceed.  Vital Signs: Because this visit was a virtual/telehealth visit, some criteria may be missing or patient reported. Any vitals not documented were not able to be obtained and vitals that have been documented are patient reported.  VideoError- Librarian, academic were attempted between this provider and patient, however failed, due to patient having technical difficulties OR patient did not have access to video capability.  We continued and completed visit with audio only.   AWV Questionnaire: No: Patient Medicare AWV questionnaire was not completed prior to this visit.  Cardiac Risk Factors include: advanced age (>2men, >67 women);hypertension;male gender     Objective:    Today's Vitals   There is no height or weight on file to calculate BMI.     02/11/2024    2:14 PM 01/10/2024    7:48 AM 05/17/2023   10:00 PM 05/17/2023   12:46 PM 04/15/2022    6:49 AM 02/27/2022    8:41 AM 12/06/2020    3:41 PM  Advanced Directives  Does Patient Have a Medical Advance Directive? No No Yes No Yes Yes Yes  Type of Advance Directive   Living will  Living will Healthcare Power of [place];Living will Healthcare Power of [place];Living will  Does patient want to make changes to medical advance directive?   No - Patient declined    No - Patient declined  Copy of Healthcare Power of Attorney in Chart?      No - copy requested No - copy requested  Would patient like information on creating a medical  advance directive?  No - Patient declined No - Patient declined No - Patient declined       Current Medications (verified) Outpatient Encounter Medications as of 02/11/2024  Medication Sig   aspirin (ASPIRIN CHILDRENS) 81 MG chewable tablet Chew 1 tablet (81 mg total) by mouth 2 (two) times daily with a meal.   cyanocobalamin (VITAMIN B12) 500 MCG tablet Take 1 tablet (500 mcg total) by mouth daily.   docusate sodium (COLACE) 100 MG capsule Take 1 capsule (100 mg total) by mouth 2 (two) times daily.   empagliflozin (JARDIANCE) 10 MG TABS tablet Take 1 tablet (10 mg total) by mouth daily before breakfast.   ferrous sulfate 325 (65 FE) MG tablet Take 1 tablet (325 mg total) by mouth daily with breakfast.   folic acid (FOLVITE) 1 MG tablet Take 1 tablet (1 mg total) by mouth daily.   hydrocortisone (CORTEF) 10 MG tablet Take 1 tablet (10 mg total) by mouth daily. Take in the morning at 7 am.   hydrocortisone (CORTEF) 5 MG tablet Take 1 tablet (5 mg total) by mouth daily in the afternoon. Take in the afternoon at 2 PM   Multiple Vitamin (MULTIVITAMIN) tablet Take 1 tablet by mouth daily.   furosemide (LASIX) 40 MG tablet Take 1 tablet (40 mg total) by mouth daily. (Patient not taking: Reported on 02/11/2024)   loperamide (IMODIUM) 2 MG capsule Take 2 mg by mouth every 6 (six) hours as needed for diarrhea or loose  stools. (Patient not taking: Reported on 02/09/2024)   senna (SENOKOT) 8.6 MG TABS tablet Take 1 tablet (8.6 mg total) by mouth 2 (two) times daily. (Patient not taking: Reported on 01/16/2024)   No facility-administered encounter medications on file as of 02/11/2024.    Allergies (verified) Latex   History: Past Medical History:  Diagnosis Date   Arthritis    hands   CHF (congestive heart failure) (HCC)    GERD (gastroesophageal reflux disease)    Hypertension    Hypomagnesemia    Hyponatremia    Prostate cancer [facility])    Past Surgical History:  Procedure Laterality Date    LYMPHADENECTOMY Bilateral 01/26/2014   Procedure: LYMPHADENECTOMY WITH INDOCYANINE GREEN DYE;  Surgeon: Sebastian Ache, MD;  Location: [facility];  Service: Urology;  Laterality: Bilateral;   RIGHT/LEFT HEART CATH AND CORONARY ANGIOGRAPHY N/A 02/25/2020   Procedure: RIGHT/LEFT HEART CATH AND CORONARY ANGIOGRAPHY;  Surgeon: Kathleene Hazel, MD;  Location: [facility];  Service: Cardiovascular;  Laterality: N/A;   ROBOT ASSISTED LAPAROSCOPIC RADICAL PROSTATECTOMY N/A 01/26/2014   Procedure: ROBOTIC ASSISTED LAPAROSCOPIC RADICAL PROSTATECTOMY;  Surgeon: Sebastian Ache, MD;  Location: [facility];  Service: Urology;  Laterality: N/A;   SHOULDER SURGERY Left    TEE WITHOUT CARDIOVERSION N/A 04/15/2022   Procedure: TRANSESOPHAGEAL ECHOCARDIOGRAM (TEE);  Surgeon: Laurey Morale, MD;  Location: [facility] ENDOSCOPY;  Service: Cardiovascular;  Laterality: N/A;   TOTAL HIP ARTHROPLASTY Left 01/11/2024   Procedure: TOTAL HIP ARTHROPLASTY ANTERIOR APPROACH;  Surgeon: Samson Frederic, MD;  Location: [facility];  Service: Orthopedics;  Laterality: Left;   Family History  Problem Relation Age of Onset   Hypertension Mother    Hypertension Father    Social History   Socioeconomic History   Marital status: Married    Spouse name: Not on file   Number of children: 2   Years of education: Not on file   Highest education level: Bachelor's degree (e.g., BA, AB, BS)  Occupational History   Not on file  Tobacco Use   Smoking status: Never   Smokeless tobacco: Never  Vaping Use   Vaping status: Never Used  Substance and Sexual Activity   Alcohol use: Not Currently    Comment: 1-3 beers daily   Drug use: No   Sexual activity: Yes  Other Topics Concern   Not on file  Social History Narrative   Not on file   Social Drivers of Health   Financial Resource Strain: Low Risk  (02/11/2024)   Overall Financial Resource Strain (CARDIA)    Difficulty of Paying Living Expenses: Not hard at all  Food Insecurity: No Food  Insecurity (02/11/2024)   Hunger Vital Sign    Worried About Running Out of Food in the Last Year: Never true    Ran Out of Food in the Last Year: Never true  Transportation Needs: No Transportation Needs (02/11/2024)   PRAPARE - Administrator, Civil Service (Medical): No    Lack of Transportation (Non-Medical): No  Physical Activity: Insufficiently Active (02/11/2024)   Exercise Vital Sign    Days of Exercise per Week: 2 days    Minutes of Exercise per Session: 40 min  Stress: No Stress Concern Present (02/11/2024)   [employer] of Occupational Health - Occupational Stress Questionnaire    Feeling of Stress : Not at all  Social Connections: Socially Integrated (02/11/2024)   Social Connection and Isolation Panel [NHANES]    Frequency of Communication with Friends and Family: Never  Frequency of Social Gatherings with Friends and Family: More than three times a week    Attends Religious Services: More than 4 times per year    Active Member of Clubs or Organizations: Yes    Attends Engineer, structural: More than 4 times per year    Marital Status: Married    Tobacco Counseling Counseling given: Not Answered    Clinical Intake:  Pre-visit preparation completed: Yes  Pain : No/denies pain     Nutritional Risks: None Diabetes: No  How often do you need to have someone help you when you read instructions, pamphlets, or other written materials from your doctor or pharmacy?: 1 - Never  Interpreter Needed?: No  Information entered by :: NAllen LPN   Activities of Daily Living     02/11/2024    2:05 PM 01/12/2024    8:57 PM  In your present state of health, do you have any difficulty performing the following activities:  Hearing? 0 0  Vision? 0 0  Difficulty concentrating or making decisions? 0 0  Walking or climbing stairs? 1   Comment broke up   Dressing or bathing? 0   Doing errands, shopping? 0 0  Preparing Food and eating ? N   Using the  Toilet? N   In the past six months, have you accidently leaked urine? N   Do you have problems with loss of bowel control? N   Managing your Medications? Y   Managing your Finances? N   Housekeeping or managing your Housekeeping? Y     Patient Care Team: [name], FNP as PCP - General (General Practice) Little Ishikawa, MD as PCP - Cardiology (Cardiology)  Indicate any recent Medical Services you may have received from other than [facility] providers in the past year (date may be approximate).     Assessment:   This is a routine wellness examination for Darryon.  Hearing/Vision screen Hearing Screening - Comments:: Denies hearing issues Vision Screening - Comments:: No regular eye exams   Goals Addressed             This Visit's Progress    Patient Stated       02/11/2024, wants to start walking without the cane       Depression Screen     02/11/2024    2:18 PM 05/15/2023    3:06 PM 12/18/2022   11:30 AM 08/14/2022   11:11 AM 02/27/2022    8:42 AM 12/06/2020    3:17 PM 12/06/2020    3:12 PM  PHQ 2/9 Scores  PHQ - 2 Score 0 0 0 0 0 0 0  PHQ- 9 Score 0 0         Fall Risk     02/11/2024    2:16 PM 05/15/2023    3:06 PM 12/18/2022   11:30 AM 08/14/2022   11:11 AM 02/27/2022    8:41 AM  Fall Risk   Falls in the past year? 1 0 0 0 0  Comment fell getting potatoes      Number falls in past yr: 0 0 0 0   Injury with Fall? 1 0 0 0   Comment broke hip      Risk for fall due to : Impaired mobility;Impaired balance/gait;Medication side effect No Fall Risks No Fall Risks No Fall Risks Medication side effect  Follow up Falls prevention discussed;Falls evaluation completed Falls evaluation completed Falls evaluation completed Falls evaluation completed Falls evaluation completed;Education provided;Falls prevention discussed  MEDICARE RISK AT HOME:  Medicare Risk at Home Any stairs in or around the home?: Yes If so, are there any without handrails?: No Home free of  loose throw rugs in walkways, pet beds, electrical cords, etc?: Yes Adequate lighting in your home to reduce risk of falls?: Yes Life alert?: No Use of a cane, walker or w/c?: Yes Grab bars in the bathroom?: No Shower chair or bench in shower?: No Elevated toilet seat or a handicapped toilet?: Yes  TIMED UP AND GO:  Was the test performed?  No  Cognitive Function: 6CIT completed    12/06/2020    3:14 PM  MMSE - Mini Mental State Exam  Orientation to time 5  Orientation to Place 5  Registration 3  Attention/ Calculation 5  Recall 3  Language- name 2 objects 2  Language- repeat 1  Language- follow 3 step command 3  Language- read & follow direction 1  Write a sentence 1  Copy design 1  Total score 30        02/11/2024    2:19 PM 02/27/2022    8:43 AM 12/06/2020    3:08 PM  6CIT Screen  What Year? 0 points 0 points 0 points  What month? 0 points 0 points 0 points  What time? 3 points 0 points 0 points  Count back from 20 0 points 4 points 0 points  Months in reverse 4 points 4 points 4 points  Repeat phrase 2 points 4 points 4 points  Total Score 9 points 12 points 8 points    Immunizations Immunization History  Administered Date(s) Administered   Fluad Quad(high Dose 65+) 10/15/2021, 09/12/2022   Influenza,inj,Quad PF,6+ Mos 09/12/2020   PFIZER(Purple Top)SARS-COV-2 Vaccination 02/24/2020, 03/21/2020, 09/22/2020   Tdap 04/25/2014   Zoster Recombinant(Shingrix) 02/27/2022, 12/18/2022    Screening Tests Health Maintenance  Topic Date Due   Pneumonia Vaccine 40+ Years old (1 of 2 - PCV) Never done   INFLUENZA VACCINE  07/10/2023   COVID-19 Vaccine (4 - 2024-25 season) 08/10/2023   DTaP/Tdap/Td (2 - Td or Tdap) 04/25/2024   Colonoscopy  02/08/2025   Medicare Annual Wellness (AWV)  02/10/2025   Hepatitis C Screening  Completed   Zoster Vaccines- Shingrix  Completed   HPV VACCINES  Aged Out    Health Maintenance  Health Maintenance Due  Topic Date Due    Pneumonia Vaccine 73+ Years old (1 of 2 - PCV) Never done   INFLUENZA VACCINE  07/10/2023   COVID-19 Vaccine (4 - 2024-25 season) 08/10/2023   Health Maintenance Items Addressed: Fredna Dow [facility] flu and cov[facility] at [facility] [address]. I will call to check.  Additional Screening:  Vision Screening: Recommended annual ophthalmology exams for early detection of glaucoma and other disorders of the eye.  Dental Screening: Recommended annual dental exams for proper oral hygiene  Community Resource Referral / Chronic Care Management: CRR required this visit?  No   CCM required this visit?  No     Plan:     I have personally reviewed and noted the following in the patient's chart:   Medical and social history Use of alcohol, tobacco or illicit drugs  Current medications and supplements including opioid prescriptions. Patient is not currently taking opioid prescriptions. Functional ability and status Nutritional status Physical activity Advanced directives List of other physicians Hospitalizations, surgeries, and ER visits in previous 12 months Vitals Screenings to include cognitive, depression, and falls Referrals and appointments  In addition, I have reviewed  and discussed with patient certain preventive protocols, quality metrics, and best practice recommendations. A written personalized care plan for preventive services as well as general preventive health recommendations were provided to patient.     Barb Merino, LPN   05/14/4402   After Visit Summary: (Pick Up) Due to this being a telephonic visit, with patients personalized plan was offered to patient and patient has requested to Pick up at office.  Notes: Nothing significant to report at this time.

## 2024-02-12 DIAGNOSIS — S72002D Fracture of unspecified part of neck of left femur, subsequent encounter for closed fracture with routine healing: Secondary | ICD-10-CM | POA: Diagnosis not present

## 2024-02-12 DIAGNOSIS — Z96642 Presence of left artificial hip joint: Secondary | ICD-10-CM | POA: Diagnosis not present

## 2024-02-12 DIAGNOSIS — I110 Hypertensive heart disease with heart failure: Secondary | ICD-10-CM | POA: Diagnosis not present

## 2024-02-12 DIAGNOSIS — M19041 Primary osteoarthritis, right hand: Secondary | ICD-10-CM | POA: Diagnosis not present

## 2024-02-12 DIAGNOSIS — I509 Heart failure, unspecified: Secondary | ICD-10-CM | POA: Diagnosis not present

## 2024-02-12 NOTE — Progress Notes (Signed)
 ADVANCED HF CLINIC NOTE   PCP: [name], FNP Cardiology: Dr. Bjorn Pippin HF Cardiology: Dr. Shirlee Latch  Chief Complaint: Heart failure follow-up  HPI: 68 y.o. with history of ETOH abuse HTN, chronic systolic CHF. nonischemic cardiomyopathy.   HF dates back to 2021. Echo 3/21 EF < 20% with severe RV dysfunction.  RHC/LHC in 3/21 showed no CAD but elevated R > L filling pressures, preserved cardiac index.    Cardiac MRI 4/21: LVEF 14%, RVEF 16%, no LGE  EF had improved to 50-55% in 05/23.  Echo (8/24): EF 25-30%, GIDD, RV normal. Suspected drop in EF 2/2 med noncompliance and ETOH.   Echo (12/24): 25-30%  Seen by EP  01/25 CRT-D evaluation. EP felt that there might not be benefit of CRT-D. Did appear that LBBB is rate dependent, may better benefit from reduced rate.   He was readmitted 02/01-02/06/25 with fall resulting in left hip fracture s/p left hip arthroplasty. Course c/b anemia and hypotension. Workup c/w adrenal insufficiency. Discharged home on hydrocortisone. Entresto, Toprol XL and spironolactone were discontinued.  Here today for CHF follow-up with his wife. He is followed by paramedicine in the community. Reports paramedicine fills his pill boxes for the week. He has been participating in home PT following hip fracture. Reports a little dyspnea with exertion which is chronic and has not progressed. No orthopnea or PND. No lower extremity edema.   Drinks about 6-8 beers a week when watching sports.   FH: Sister with CHF, brother with pacemaker, mother with CHF.   SH: Married, lives in [place], heavy ETOH in the past has now cut back, no drugs.  He was a Estate agent.   ROS: All systems reviewed and negative except as per HPI.   Current Outpatient Medications  Medication Sig Dispense Refill   aspirin (ASPIRIN CHILDRENS) 81 MG chewable tablet Chew 1 tablet (81 mg total) by mouth 2 (two) times daily with a meal. 90 tablet 0   cyanocobalamin (VITAMIN B12) 500  MCG tablet Take 1 tablet (500 mcg total) by mouth daily. 30 tablet 0   docusate sodium (COLACE) 100 MG capsule Take 1 capsule (100 mg total) by mouth 2 (two) times daily. 10 capsule 0   empagliflozin (JARDIANCE) 10 MG TABS tablet Take 1 tablet (10 mg total) by mouth daily before breakfast. 90 tablet 3   ferrous sulfate 325 (65 FE) MG tablet Take 1 tablet (325 mg total) by mouth daily with breakfast. 30 tablet 3   folic acid (FOLVITE) 1 MG tablet Take 1 tablet (1 mg total) by mouth daily. 30 tablet 0   furosemide (LASIX) 40 MG tablet Take 1 tablet (40 mg total) by mouth daily. 90 tablet 3   hydrocortisone (CORTEF) 10 MG tablet Take 1 tablet (10 mg total) by mouth daily. Take in the morning at 7 am. 30 tablet 0   hydrocortisone (CORTEF) 5 MG tablet Take 1 tablet (5 mg total) by mouth daily in the afternoon. Take in the afternoon at 2 PM 30 tablet 0   loperamide (IMODIUM) 2 MG capsule Take 2 mg by mouth every 6 (six) hours as needed for diarrhea or loose stools.     Multiple Vitamin (MULTIVITAMIN) tablet Take 1 tablet by mouth daily.     senna (SENOKOT) 8.6 MG TABS tablet Take 1 tablet (8.6 mg total) by mouth 2 (two) times daily. 120 tablet 0   No current facility-administered medications for this encounter.   Wt Readings from Last 3 Encounters:  02/13/24 69.7  kg (153 lb 9.6 oz)  02/05/24 66.2 kg (146 lb)  01/28/24 66.7 kg (147 lb)   BP 106/78   Ht 5\' 9"  (1.753 m)   Wt 69.7 kg (153 lb 9.6 oz)   BMI 22.68 kg/m   Physical Exam General:  Well appearing. Thin. Ambulated into clinic with a walker. Neck: no JVD.  Cor: Regular rate & rhythm. No rubs, gallops or murmurs. Lungs: clear Abdomen: soft, nontender, nondistended.  Extremities: no edema Neuro: alert & orientedx3. Affect pleasant  ST 107 bpm, LBBB with QRS 144 ms  Assessment/Plan: 1. Chronic systolic CHF: Nonischemic cardiomyopathy, possibly due to ETOH.  Echo 3/21 EF 20%, moderately decreased RV.  RHC/LHC 3/21 no significant  coronary disease, preserved cardiac output, R>L heart failure.  Cardiac MRI in 4/21 showed EF 14%, severe RV dysfunction. No LGE noted on cardiac MRI but difficult LGE images, cannot rule out prior myocarditis.  TEE in 5/23 showed EF 50-55%, normal RV, trivial MR, mobile RA structure seen on TTE was [facility]. Echo in 8/24 with drop in EF to 25-30% in setting of heavy ETOH intake and noncompliance with meds. Echo 10/24 EF 25-30% on GDMT. He has cut back some on ETOH but is still drinking.  - NYHA difficult to assess. Mobility limited by recent hip fracture. Volume looks good. Continue lasix 40 mg daily.  - Need to get back on GDMT. Most of meds stopped during recent hospitalization. - Start losartan 25 mg daily, try to get back on entresto in future.  - Continue Jardiance 10 mg daily.  - Add back beta blocker and spiro next visit.  - BMET/BNP today, repeat BMET in 2 weeks - Encouraged to continue to cut back on ETOH use.   - Seen by Dr. Graciela Husbands for CRT-D. Not sure that with age whether ICD would be beneficial for mortality. It was thought that LBBB was more rate depend and would benefit from better rate control. Beta blocker next as above.  2. LBBB:  - Repeat Echo 10/24: 25-30% after compliance with medications and cutting back on ETOH. - See above CRT-D discussion  3. ETOH abuse: Long history of heavy ETOH. This may be the cause of his cardiomyopathy.  He is still drinking but adamant it is much less. Discussed with patient and his wife again today.  4. Hyponatremia: In setting of beer intake. Last Na 132. - Labs today  5. Adrenal insufficiency: Low am cortisol during recent admit. He was placed on hydrocortisone - Refer to endocrine  Follow up 4 weeks with APP to titrate GDMT  [facility], Janzen Sacks N, PA-C 02/13/2024

## 2024-02-13 ENCOUNTER — Ambulatory Visit (HOSPITAL_COMMUNITY)
Admission: RE | Admit: 2024-02-13 | Discharge: 2024-02-13 | Disposition: A | Discharge status: Home / Self Care | Payer: Medicare Other | Source: Ambulatory Visit | Attending: Physician Assistant | Admitting: Physician Assistant

## 2024-02-13 ENCOUNTER — Encounter (HOSPITAL_COMMUNITY): Payer: Self-pay

## 2024-02-13 VITALS — BP 106/78 | Ht 69.0 in | Wt 153.6 lb

## 2024-02-13 DIAGNOSIS — I428 Other cardiomyopathies: Secondary | ICD-10-CM | POA: Insufficient documentation

## 2024-02-13 DIAGNOSIS — I110 Hypertensive heart disease with heart failure: Secondary | ICD-10-CM | POA: Insufficient documentation

## 2024-02-13 DIAGNOSIS — F1010 Alcohol abuse, uncomplicated: Secondary | ICD-10-CM | POA: Diagnosis not present

## 2024-02-13 DIAGNOSIS — I447 Left bundle-branch block, unspecified: Secondary | ICD-10-CM | POA: Diagnosis not present

## 2024-02-13 DIAGNOSIS — Z79899 Other long term (current) drug therapy: Secondary | ICD-10-CM | POA: Diagnosis not present

## 2024-02-13 DIAGNOSIS — I5022 Chronic systolic (congestive) heart failure: Secondary | ICD-10-CM | POA: Diagnosis not present

## 2024-02-13 DIAGNOSIS — Z7984 Long term (current) use of oral hypoglycemic drugs: Secondary | ICD-10-CM | POA: Insufficient documentation

## 2024-02-13 DIAGNOSIS — E2740 Unspecified adrenocortical insufficiency: Secondary | ICD-10-CM | POA: Insufficient documentation

## 2024-02-13 DIAGNOSIS — E871 Hypo-osmolality and hyponatremia: Secondary | ICD-10-CM | POA: Insufficient documentation

## 2024-02-13 LAB — BASIC METABOLIC PANEL
Anion gap: 13 (ref 5–15)
BUN: 12 mg/dL (ref 8–23)
CO2: 23 mmol/L (ref 22–32)
Calcium: 8.8 mg/dL — ABNORMAL LOW (ref 8.9–10.3)
Chloride: 100 mmol/L (ref 98–111)
Creatinine, Ser: 0.99 mg/dL (ref 0.61–1.24)
GFR, Estimated: >60 mL/min (ref >60)
Glucose, Bld: 91 mg/dL (ref 70–99)
Potassium: 3.7 mmol/L (ref 3.5–5.1)
Sodium: 136 mmol/L (ref 135–145)

## 2024-02-13 LAB — BRAIN NATRIURETIC PEPTIDE: B Natriuretic Peptide: 1383.1 pg/mL — ABNORMAL HIGH (ref 0.0–100.0)

## 2024-02-13 MED ORDER — LOSARTAN POTASSIUM 25 MG PO TABS: 25.0000 mg | ORAL_TABLET | Freq: Every day | ORAL | 0 refills | Status: DC

## 2024-02-13 NOTE — Patient Instructions (Addendum)
 Medication Changes:  START LOSARTAN 25 MG BY MOUTH DAILY.  Lab Work:  Labs done today, your results will be available in MyChart, we will contact you for abnormal readings.  LABS IN 2 WEEKS AS SCHEDULED    Follow-Up in: 4 WEEKS AS SCHEDULED   At the [facility], you and your health needs are our priority. We have a designated team specialized in the treatment of Heart Failure. This Care Team includes your primary Heart Failure Specialized Cardiologist (physician), Advanced Practice Providers (APPs- Physician Assistants and Nurse Practitioners), and Pharmacist who all work together to provide you with the care you need, when you need it.   You may see any of the following providers on your designated Care Team at your next follow up:  Dr. Arvilla Meres Dr. Marca Ancona Dr. Dorthula Nettles Dr. Theresia Bough Tonye Becket, NP Robbie Lis, [facility] [place], [place] Brynda Peon, NP [place] Lee, NP Karle Plumber, PharmD   Please be sure to bring in all your medications bottles to every appointment.   Need to Contact us:  If you have any questions or concerns before your next appointment please send [place] a message through [place] or call our office at [phone].    TO LEAVE A MESSAGE FOR THE NURSE SELECT OPTION 2, PLEASE LEAVE A MESSAGE INCLUDING: YOUR NAME DATE OF BIRTH CALL BACK NUMBER REASON FOR CALL**this is important as we prioritize the call backs  YOU WILL RECEIVE A CALL BACK THE SAME DAY AS LONG AS YOU CALL BEFORE 4:00 PM

## 2024-02-17 ENCOUNTER — Other Ambulatory Visit (HOSPITAL_COMMUNITY): Payer: Self-pay | Admitting: Emergency Medicine

## 2024-02-17 NOTE — Progress Notes (Signed)
 Paramedicine Encounter    Patient ID: Austin [name], male    DOB: Mar 31, 1956, 68 y.o.   MRN: [id number]   Complaints NONE  Assessment A&O x 4, skin W&D w/ good color.  Pt denies chest pain or SOB.  Lung sounds clear throughout and no peripheral edema noted.  Strong odor of ETOH on breath.  Compliance with meds NO   Pill box filled x 1 week  Refills needed Hydrocortisone 10mg  & Hydrocortisone 5mg . Message sent to PCP for refills   Meds changes since last visit Started Losartan today    Social changes NONE   BP 110/80 (BP Location: Right Arm, Patient Position: Sitting, Cuff Size: Normal)   Pulse (!) 114   Resp 16   SpO2 97%  Weight yesterday-Not taken Last visit weight-146lb  ATF [name] A&O x 4, skin W&D w/ good color.  Denies chest pain or SOB.  Lung sounds clear and no peripheral edema noted.  Pt was seen in HF and started on Losartan 25mg  daily.   Pt w/ strong odor of ETOH present and an empty 40oz beer bottle observed in the trash can. Med box reconciled x 1 week.  Needs refills on his Hydrocortisone prescriptions.  He received them while in the hospital for his hip replacement.  I sent message to his PCP for refills for same.  He has enough to last till Thursday of this week.  Next home visit scheduled 3/18 @ 2:00.  ACTION: Home visit completed  Bethanie Dicker [phone] 02/17/24  Patient Care Team: [name], FNP as PCP - General (General Practice) Little Ishikawa, MD as PCP - Cardiology (Cardiology)  Patient Active Problem List   Diagnosis Date Noted   ABLA (acute blood loss anemia) 01/13/2024   Adrenal insufficiency (HCC) 01/12/2024   Closed left hip fracture (HCC) 01/10/2024   Transient hypotension 06/05/2023   Alcohol use disorder 06/05/2023   Hypertensive 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 (HCC) 06/05/2023   Elevated brain natriuretic peptide (BNP) level 06/05/2023   Elevated  LFTs 05/18/2023   Prolonged QT interval 05/18/2023   Hyponatremia 05/17/2023   Iron deficiency anemia 09/03/2020   NICM (nonischemic cardiomyopathy) (HCC)    ETOH abuse 11/10/2018   Essential hypertension 11/10/2018   Blood in stool 11/10/2018    Current Outpatient Medications:    cyanocobalamin (VITAMIN B12) 500 MCG tablet, Take 1 tablet (500 mcg total) by mouth daily., Disp: 30 tablet, Rfl: 0   docusate sodium (COLACE) 100 MG capsule, Take 1 capsule (100 mg total) by mouth 2 (two) times daily., Disp: 10 capsule, Rfl: 0   empagliflozin (JARDIANCE) 10 MG TABS tablet, Take 1 tablet (10 mg total) by mouth daily before breakfast., Disp: 90 tablet, Rfl: 3   ferrous sulfate 325 (65 FE) MG tablet, Take 1 tablet (325 mg total) by mouth daily with breakfast., Disp: 30 tablet, Rfl: 3   furosemide (LASIX) 40 MG tablet, Take 1 tablet (40 mg total) by mouth daily., Disp: 90 tablet, Rfl: 3   hydrocortisone (CORTEF) 10 MG tablet, Take 1 tablet (10 mg total) by mouth daily. Take in the morning at 7 am., Disp: 30 tablet, Rfl: 0   hydrocortisone (CORTEF) 5 MG tablet, Take 1 tablet (5 mg total) by mouth daily in the afternoon. Take in the afternoon at 2 PM, Disp: 30 tablet, Rfl: 0   losartan (COZAAR) 25 MG tablet, Take 1 tablet (25 mg total) by mouth daily., Disp: 90 tablet, Rfl:  0   senna (SENOKOT) 8.6 MG TABS tablet, Take 1 tablet (8.6 mg total) by mouth 2 (two) times daily., Disp: 120 tablet, Rfl: 0   aspirin (ASPIRIN CHILDRENS) 81 MG chewable tablet, Chew 1 tablet (81 mg total) by mouth 2 (two) times daily with a meal., Disp: 90 tablet, Rfl: 0   folic acid (FOLVITE) 1 MG tablet, Take 1 tablet (1 mg total) by mouth daily., Disp: 30 tablet, Rfl: 0   loperamide (IMODIUM) 2 MG capsule, Take 2 mg by mouth every 6 (six) hours as needed for diarrhea or loose stools. (Patient not taking: Reported on 02/17/2024), Disp: , Rfl:    Multiple Vitamin (MULTIVITAMIN) tablet, Take 1 tablet by mouth daily. (Patient not  taking: Reported on 02/17/2024), Disp: , Rfl:  Allergies  Allergen Reactions   Latex Swelling and Rash     Social History   Socioeconomic History   Marital status: Married    Spouse name: Not on file   Number of children: 2   Years of education: Not on file   Highest education level: Bachelor's degree (e.g., BA, AB, BS)  Occupational History   Not on file  Tobacco Use   Smoking status: Never   Smokeless tobacco: Never  Vaping Use   Vaping status: Never Used  Substance and Sexual Activity   Alcohol use: Not Currently    Comment: 1-3 beers daily   Drug use: No   Sexual activity: Yes  Other Topics Concern   Not on file  Social History Narrative   Not on file   Social Drivers of Health   Financial Resource Strain: Low Risk  (02/11/2024)   Overall Financial Resource Strain (CARDIA)    Difficulty of Paying Living Expenses: Not hard at all  Food Insecurity: No Food Insecurity (02/11/2024)   Hunger Vital Sign    Worried About Running Out of Food in the Last Year: Never true    Ran Out of Food in the Last Year: Never true  Transportation Needs: No Transportation Needs (02/11/2024)   PRAPARE - Administrator, Civil Service (Medical): No    Lack of Transportation (Non-Medical): No  Physical Activity: Insufficiently Active (02/11/2024)   Exercise Vital Sign    Days of Exercise per Week: 2 days    Minutes of Exercise per Session: 40 min  Stress: No Stress Concern Present (02/11/2024)   [employer] of Occupational Health - Occupational Stress Questionnaire    Feeling of Stress : Not at all  Social Connections: Socially Integrated (02/11/2024)   Social Connection and Isolation Panel [NHANES]    Frequency of Communication with Friends and Family: Never    Frequency of Social Gatherings with Friends and Family: More than three times a week    Attends Religious Services: More than 4 times per year    Active Member of Clubs or Organizations: Yes    Attends Tax inspector Meetings: More than 4 times per year    Marital Status: Married  Catering manager Violence: Not At Risk (02/11/2024)   Humiliation, Afraid, Rape, and Kick questionnaire    Fear of Current or Ex-Partner: No    Emotionally Abused: No    Physically Abused: No    Sexually Abused: No    Physical Exam      Future Appointments  Date Time Provider Department Center  02/27/2024  9:00 AM [facility] LAB [facility] None  03/11/2024  9:30 AM [facility] PA/NP [facility] None  03/16/2025  2:40 PM [facility]-ANNUAL WELLNESS VISIT  [facility] None

## 2024-02-18 ENCOUNTER — Telehealth (HOSPITAL_COMMUNITY): Payer: Self-pay | Admitting: Emergency Medicine

## 2024-02-18 ENCOUNTER — Other Ambulatory Visit: Payer: Self-pay | Admitting: Nurse Practitioner

## 2024-02-18 MED ORDER — HYDROCORTISONE 10 MG PO TABS: 10.0000 mg | ORAL_TABLET | Freq: Every day | ORAL | 0 refills | Status: DC

## 2024-02-18 NOTE — Telephone Encounter (Signed)
 Copied from CRM [id number]. Topic: Clinical - Medication Refill >> Feb 18, 2024  9:11 AM Gildardo Pounds wrote: Most Recent Primary Care Visit:  Provider: Barb Merino  Department: [facility]  Visit Type: ANNUAL WELL VISIT, SEQUENTIAL  Date: 02/11/2024  Medication: hydrocortisone (CORTEF) 5 MG tablet  Has the patient contacted their pharmacy? Yes (Agent: If no, request that the patient contact the pharmacy for the refill. If patient does not wish to contact the pharmacy document the reason why and proceed with request.) (Agent: If yes, when and what did the pharmacy advise?)Yes, call provider because it was prescribed by hospital.  Is this the correct pharmacy for this prescription? Yes If no, delete pharmacy and type the correct one.  This is the patient's preferred pharmacy:  [id number] [place], [place] - [address] AT [facility] [address] [place] [address] Phone: [phone] Fax: [phone]  Has the prescription been filled recently? No  Is the patient out of the medication? No  Has the patient been seen for an appointment in the last year OR does the patient have an upcoming appointment? Yes  Can we respond through MyChart? No  Agent: Please be advised that Rx refills may take up to 3 business days. We ask that you follow-up with your pharmacy.

## 2024-02-18 NOTE — Telephone Encounter (Signed)
 Followed up w/ Claiborne Rigg to let her know I have sent a message to [name] directly regarding refills on Mr. Bolon's Hydrocortisone on 3/11 and called the doctor's office this morning 3/12 regarding same.  Will keep watch to see when this is called in and let Lawson Fiscal know.    Beatrix Shipper, EMT-Paramedic [phone] 02/18/2024

## 2024-02-19 ENCOUNTER — Other Ambulatory Visit (HOSPITAL_COMMUNITY): Payer: Self-pay | Admitting: Emergency Medicine

## 2024-02-19 NOTE — Progress Notes (Signed)
 Follow up for med rec.  Added Hydrocortisone that was picked up this morning to pill box.  Only receive Hydrocortisone 10mg . Need 5mg . Hydrocortisone for p.m. dose.  Put 1/2 10mg  tablet  in the evening dose.    Beatrix Shipper, EMT-Paramedic [phone] 02/19/2024

## 2024-02-24 ENCOUNTER — Other Ambulatory Visit (HOSPITAL_COMMUNITY): Payer: Self-pay | Admitting: Emergency Medicine

## 2024-02-24 NOTE — Progress Notes (Unsigned)
 Paramedicine Encounter    Patient ID: Austin [name], male    DOB: [birth year], 68 y.o.   MRN: [id number]   Complaints stomach ache w/ loose bowel movement  Assessment A&O x 4, skin W&D w/ good color.  Denies chest pain or SOB.  Lung sounds clear bilat and no peripheral edema noted.  Compliance with meds Missed S/W/S a.m. regiment and S/W/S p.m. doses.  Pill box filled x 1 week  Refills needed Multivitamin   Meds changes since last visit NONE    Social changes NONE  ATF Austin [name] A&O laying on the sofa.  He states he has had a stomach ache and some diarrhea today.  He thinks it may be something he ate.  Pt. Needs improvement in med compliance.  He denies chest pain or SOB.  Lung sounds clear and equal bilat No peripheral edema.    BP 120/80 (BP Location: Left Arm, Patient Position: Sitting, Cuff Size: Normal)   Pulse (!) 104   Resp 16   Wt 144 lb 12.8 oz (65.7 kg)   SpO2 98%   BMI 21.38 kg/m  Weight yesterday- not taken Last visit weight-146lb   ACTION: {Paramed Action:[phone]}  Austin [name], EMT-Paramedic [phone] 02/24/24  Patient Care Team: Austin Felts, FNP as PCP - General (General Practice) Austin Ishikawa, MD as PCP - Cardiology (Cardiology)  Patient Active Problem List   Diagnosis Date Noted  . ABLA (acute blood loss anemia) 01/13/2024  . Adrenal insufficiency (HCC) 01/12/2024  . Closed left hip fracture (HCC) 01/10/2024  . Transient hypotension 06/05/2023  . Alcohol use disorder 06/05/2023  . 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 Elevated brain natriuretic peptide (BNP) level 06/05/2023  . Elevated LFTs 05/18/2023  . Prolonged QT interval 05/18/2023  . Hyponatremia 05/17/2023  . Iron deficiency anemia 09/03/2020  . NICM (nonischemic cardiomyopathy) (HCC)   . ETOH abuse 11/10/2018  . Essential hypertension 11/10/2018  . Blood in stool 11/10/2018     Current Outpatient Medications:  .  aspirin (ASPIRIN CHILDRENS) 81 MG chewable tablet, Chew 1 tablet (81 mg total) by mouth 2 (two) times daily with a meal., Disp: 90 tablet, Rfl: 0 .  cyanocobalamin (VITAMIN B12) 500 MCG tablet, Take 1 tablet (500 mcg total) by mouth daily., Disp: 30 tablet, Rfl: 0 .  docusate sodium (COLACE) 100 MG capsule, Take 1 capsule (100 mg total) by mouth 2 (two) times daily., Disp: 10 capsule, Rfl: 0 .  empagliflozin (JARDIANCE) 10 MG TABS tablet, Take 1 tablet (10 mg total) by mouth daily before breakfast., Disp: 90 tablet, Rfl: 3 .  ferrous sulfate 325 (65 FE) MG tablet, Take 1 tablet (325 mg total) by mouth daily with breakfast., Disp: 30 tablet, Rfl: 3 .  furosemide (LASIX) 40 MG tablet, Take 1 tablet (40 mg total) by mouth daily., Disp: 90 tablet, Rfl: 3 .  hydrocortisone (CORTEF) 10 MG tablet, Take 1 tablet (10 mg total) by mouth daily. Take in the morning at 7 am., Disp: 30 tablet, Rfl: 0 .  hydrocortisone (CORTEF) 5 MG tablet, Take 1 tablet (5 mg total) by mouth daily in the afternoon. Take in the afternoon at 2 PM, Disp: 30 tablet, Rfl: 0 .  losartan (COZAAR) 25 MG tablet, Take 1 tablet (25 mg total) by mouth daily., Disp: 90 tablet, Rfl: 0 .  folic acid (FOLVITE) 1 MG tablet, Take 1 tablet (1 mg total) by mouth daily. (Patient not taking: Reported on  02/24/2024), Disp: 30 tablet, Rfl: 0 .  loperamide (IMODIUM) 2 MG capsule, Take 2 mg by mouth every 6 (six) hours as needed for diarrhea or loose stools. (Patient not taking: Reported on 02/17/2024), Disp: , Rfl:  .  Multiple Vitamin (MULTIVITAMIN) tablet, Take 1 tablet by mouth daily. (Patient not taking: Reported on 02/17/2024), Disp: , Rfl:  .  senna (SENOKOT) 8.6 MG TABS tablet, Take 1 tablet (8.6 mg total) by mouth 2 (two) times daily. (Patient not taking: Reported on 02/24/2024), Disp: 120 tablet, Rfl: 0 Allergies  Allergen Reactions  . Latex Swelling and Rash     Social History   Socioeconomic History   . Marital status: Married    Spouse name: Not on file  . Number of children: 2  . Years of education: Not on file  . Highest education level: Bachelor's degree (e.g., BA, AB, BS)  Occupational History  . Not on file  Tobacco Use  . Smoking status: Never  . Smokeless tobacco: Never  Vaping Use  . Vaping status: Never Used  Substance and Sexual Activity  . Alcohol use: Not Currently    Comment: 1-3 beers daily  . Drug use: No  . Sexual activity: Yes  Other Topics Concern  . Not on file  Social History Narrative  . Not on file   Social Drivers of Health   Financial Resource Strain: Low Risk  (02/11/2024)   Overall Financial Resource Strain (CARDIA)   . Difficulty of Paying Living Expenses: Not hard at all  Food Insecurity: No Food Insecurity (02/11/2024)   Hunger Vital Sign   . Worried About Programme researcher, broadcasting/film/video in the Last Year: Never true   . Ran Out of Food in the Last Year: Never true  Transportation Needs: No Transportation Needs (02/11/2024)   PRAPARE - Transportation   . Lack of Transportation (Medical): No   . Lack of Transportation (Non-Medical): No  Physical Activity: Insufficiently Active (02/11/2024)   Exercise Vital Sign   . Days of Exercise per Week: 2 days   . Minutes of Exercise per Session: 40 min  Stress: No Stress Concern Present (02/11/2024)   [employer] of Occupational Health - Occupational Stress Questionnaire   . Feeling of Stress : Not at all  Social Connections: Socially Integrated (02/11/2024)   Social Connection and Isolation Panel [NHANES]   . Frequency of Communication with Friends and Family: Never   . Frequency of Social Gatherings with Friends and Family: More than three times a week   . Attends Religious Services: More than 4 times per year   . Active Member of Clubs or Organizations: Yes   . Attends Banker Meetings: More than 4 times per year   . Marital Status: Married  Catering manager Violence: Not At Risk (02/11/2024)    Humiliation, Afraid, Rape, and Kick questionnaire   . Fear of Current or Ex-Partner: No   . Emotionally Abused: No   . Physically Abused: No   . Sexually Abused: No    Physical Exam      Future Appointments  Date Time Provider Department Center  02/27/2024  9:00 AM [facility] LAB [facility] None  03/02/2024  9:40 AM Austin Felts, FNP [facility] None  03/11/2024  9:30 AM [facility] PA/NP [facility] None  03/16/2025  2:40 PM [facility]-ANNUAL WELLNESS VISIT [facility] None

## 2024-02-27 ENCOUNTER — Telehealth (HOSPITAL_COMMUNITY): Payer: Self-pay

## 2024-02-27 ENCOUNTER — Ambulatory Visit (HOSPITAL_COMMUNITY)
Admission: RE | Admit: 2024-02-27 | Discharge: 2024-02-27 | Disposition: A | Discharge status: Home / Self Care | Source: Ambulatory Visit | Attending: Cardiology | Admitting: Cardiology

## 2024-02-27 DIAGNOSIS — I5022 Chronic systolic (congestive) heart failure: Secondary | ICD-10-CM

## 2024-02-27 LAB — BASIC METABOLIC PANEL
Anion gap: 12 (ref 5–15)
BUN: 6 mg/dL — ABNORMAL LOW (ref 8–23)
CO2: 25 mmol/L (ref 22–32)
Calcium: 8.5 mg/dL — ABNORMAL LOW (ref 8.9–10.3)
Chloride: 91 mmol/L — ABNORMAL LOW (ref 98–111)
Creatinine, Ser: 1.07 mg/dL (ref 0.61–1.24)
GFR, Estimated: >60 mL/min (ref >60)
Glucose, Bld: 105 mg/dL — ABNORMAL HIGH (ref 70–99)
Potassium: 3.4 mmol/L — ABNORMAL LOW (ref 3.5–5.1)
Sodium: 128 mmol/L — ABNORMAL LOW (ref 135–145)

## 2024-02-27 MED ORDER — SPIRONOLACTONE 25 MG PO TABS: 12.5000 mg | ORAL_TABLET | Freq: Every day | ORAL | 3 refills | Status: DC

## 2024-02-27 NOTE — Telephone Encounter (Signed)
 Spoke with patients wife- okay per DPR she is aware of results and instructions and verbalized understanding.   Medication sent to pharmacy and repeat labs ordered and scheduled- per wife she will ensure patient is at follow up blood work apt.   Advised to call back if concerns and she verbalized understanding.

## 2024-02-27 NOTE — Telephone Encounter (Signed)
-----   Message from [facility] [place] N sent at 02/27/2024  4:17 PM EDT ----- His K is low. Start spiro 12.5 mg daily. BMET 1 week

## 2024-03-02 ENCOUNTER — Telehealth (HOSPITAL_COMMUNITY): Payer: Self-pay | Admitting: Cardiology

## 2024-03-02 ENCOUNTER — Ambulatory Visit: Admitting: Nurse Practitioner

## 2024-03-02 ENCOUNTER — Other Ambulatory Visit (HOSPITAL_COMMUNITY): Payer: Self-pay | Admitting: Emergency Medicine

## 2024-03-02 NOTE — Progress Notes (Signed)
 Paramedicine Encounter    Patient ID: TARIK TEIXEIRA, male    DOB: 03/02/1956, 68 y.o.   MRN: [id number]   Complaints NONE  Assessment A&O x 4, skin W&D w/ good color.  Pt denies chest pain or SOB.  Lung sounds clear bilat. No peripheral edema.  BP WNL but HR elevated.  Compliance with meds NO- missed a whole weeks worth of dosing.  Pt has been staying in hospital w/ his wife as she's had surgery.  Pill box filled added 12.5mg  Spironolactone to his regimen  Refills needed Folivite  Meds changes since last visit added 12.5mg  Spironolactone    Social changes NONE   BP 120/80 (BP Location: Right Arm, Patient Position: Sitting, Cuff Size: Normal)   Pulse (!) 108   Resp 18   Wt 151 lb 9.6 oz (68.8 kg)   SpO2 96%   BMI 22.39 kg/m  Weight yesterday- not taken Last visit weight-144lb   Today's visit finds Mr. Wenrich A&O x 4, skin W&D w/ good color.  He denies chest pain or SOB.  Lung sounds clear and no peripheral edema noted.  Pt w/ odor of ETOH present and has beer in his cup during home visit.  He has taken no meds for the past 7 days.  His wife had surgery and he has stayed w/ her at the hospital ans says he's been too busy taking care of her to take his meds.  Labs done on 02/27/24 and med changes made at that time but no started same until today.   Spironolactone added back to his regimen 12.5mg  daily due to low potassium.  He has an appointment on  03/11/24 @ 9:30 and will have repeat labs done at that visit. Continue to impress the importance of med compliance.  His weight is up 7lbs from last visit.  Next home visit 03/09/24.  ACTION: Home visit completed  Bethanie Dicker [phone] 03/02/24  Patient Care Team: [name], FNP as PCP - General (General Practice) Little Ishikawa, MD as PCP - Cardiology (Cardiology)  Patient Active Problem List   Diagnosis Date Noted   ABLA (acute blood loss anemia) 01/13/2024   Adrenal insufficiency (HCC)  01/12/2024   Closed left hip fracture (HCC) 01/10/2024   Transient hypotension 06/05/2023   Alcohol use disorder 06/05/2023   Hypertensive 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 (HCC) 06/05/2023   Elevated brain natriuretic peptide (BNP) level 06/05/2023   Elevated LFTs 05/18/2023   Prolonged QT interval 05/18/2023   Hyponatremia 05/17/2023   Iron deficiency anemia 09/03/2020   NICM (nonischemic cardiomyopathy) (HCC)    ETOH abuse 11/10/2018   Essential hypertension 11/10/2018   Blood in stool 11/10/2018    Current Outpatient Medications:    cyanocobalamin (VITAMIN B12) 500 MCG tablet, Take 1 tablet (500 mcg total) by mouth daily., Disp: 30 tablet, Rfl: 0   docusate sodium (COLACE) 100 MG capsule, Take 1 capsule (100 mg total) by mouth 2 (two) times daily., Disp: 10 capsule, Rfl: 0   empagliflozin (JARDIANCE) 10 MG TABS tablet, Take 1 tablet (10 mg total) by mouth daily before breakfast., Disp: 90 tablet, Rfl: 3   ferrous sulfate 325 (65 FE) MG tablet, Take 1 tablet (325 mg total) by mouth daily with breakfast., Disp: 30 tablet, Rfl: 3   furosemide (LASIX) 40 MG tablet, Take 1 tablet (40 mg total) by mouth daily., Disp: 90 tablet, Rfl: 3   hydrocortisone (CORTEF) 10 MG tablet, Take 1 tablet (  10 mg total) by mouth daily. Take in the morning at 7 am., Disp: 30 tablet, Rfl: 0   hydrocortisone (CORTEF) 5 MG tablet, Take 1 tablet (5 mg total) by mouth daily in the afternoon. Take in the afternoon at 2 PM, Disp: 30 tablet, Rfl: 0   losartan (COZAAR) 25 MG tablet, Take 1 tablet (25 mg total) by mouth daily., Disp: 90 tablet, Rfl: 0   spironolactone (ALDACTONE) 25 MG tablet, Take 0.5 tablets (12.5 mg total) by mouth daily., Disp: 45 tablet, Rfl: 3   folic acid (FOLVITE) 1 MG tablet, Take 1 tablet (1 mg total) by mouth daily. (Patient not taking: Reported on 02/19/2024), Disp: 30 tablet, Rfl: 0   loperamide (IMODIUM) 2 MG capsule, Take 2 mg by mouth every  6 (six) hours as needed for diarrhea or loose stools. (Patient not taking: Reported on 02/17/2024), Disp: , Rfl:    Multiple Vitamin (MULTIVITAMIN) tablet, Take 1 tablet by mouth daily. (Patient not taking: Reported on 02/17/2024), Disp: , Rfl:    senna (SENOKOT) 8.6 MG TABS tablet, Take 1 tablet (8.6 mg total) by mouth 2 (two) times daily. (Patient not taking: Reported on 02/19/2024), Disp: 120 tablet, Rfl: 0 Allergies  Allergen Reactions   Latex Swelling and Rash     Social History   Socioeconomic History   Marital status: Married    Spouse name: Not on file   Number of children: 2   Years of education: Not on file   Highest education level: Bachelor's degree (e.g., BA, AB, BS)  Occupational History   Not on file  Tobacco Use   Smoking status: Never   Smokeless tobacco: Never  Vaping Use   Vaping status: Never Used  Substance and Sexual Activity   Alcohol use: Not Currently    Comment: 1-3 beers daily   Drug use: No   Sexual activity: Yes  Other Topics Concern   Not on file  Social History Narrative   Not on file   Social Drivers of Health   Financial Resource Strain: Low Risk  (02/11/2024)   Overall Financial Resource Strain (CARDIA)    Difficulty of Paying Living Expenses: Not hard at all  Food Insecurity: No Food Insecurity (02/11/2024)   Hunger Vital Sign    Worried About Running Out of Food in the Last Year: Never true    Ran Out of Food in the Last Year: Never true  Transportation Needs: No Transportation Needs (02/11/2024)   PRAPARE - Administrator, Civil Service (Medical): No    Lack of Transportation (Non-Medical): No  Physical Activity: Insufficiently Active (02/11/2024)   Exercise Vital Sign    Days of Exercise per Week: 2 days    Minutes of Exercise per Session: 40 min  Stress: No Stress Concern Present (02/11/2024)   [employer] of Occupational Health - Occupational Stress Questionnaire    Feeling of Stress : Not at all  Social Connections:  Socially Integrated (02/11/2024)   Social Connection and Isolation Panel [NHANES]    Frequency of Communication with Friends and Family: Never    Frequency of Social Gatherings with Friends and Family: More than three times a week    Attends Religious Services: More than 4 times per year    Active Member of [employer] or Organizations: Yes    Attends Banker Meetings: More than 4 times per year    Marital Status: Married  Catering manager Violence: Not At Risk (02/11/2024)   Humiliation, Afraid, Rape, and  Kick questionnaire    Fear of Current or Ex-Partner: No    Emotionally Abused: No    Physically Abused: No    Sexually Abused: No    Physical Exam      Future Appointments  Date Time Provider Department Center  03/11/2024  9:30 AM [facility] PA/NP [facility] None  03/16/2025  2:40 PM [facility]-ANNUAL WELLNESS VISIT [facility] None

## 2024-03-02 NOTE — Telephone Encounter (Signed)
 [facility] with para medicine called to report pt has not started spironolactone as ordered 3/21, will add to pill box today. No need to reschedule labs has a follow up 4/3, will recheck at that time

## 2024-03-05 ENCOUNTER — Other Ambulatory Visit (HOSPITAL_COMMUNITY)

## 2024-03-09 ENCOUNTER — Telehealth (HOSPITAL_COMMUNITY): Payer: Self-pay | Admitting: Emergency Medicine

## 2024-03-09 NOTE — Telephone Encounter (Signed)
 Called and spoke to Mr. Poupard's wife to tell her I was on my way for our scheduled morning visit.  She advised he did not want to be seen today as he has been vomiting and did not feel well.  I inquired about chest pain and he advised that he had no pain.   I did attempt to at least swing by and look over his meds and reconcile his pill box and he advised "not today."    Beatrix Shipper, EMT-Paramedic [phone] 03/09/2024

## 2024-03-10 ENCOUNTER — Telehealth (HOSPITAL_COMMUNITY): Payer: Self-pay

## 2024-03-10 NOTE — Progress Notes (Signed)
 [facility] CLINIC NOTE   PCP: [name], FNP Cardiology: Dr. Bjorn Pippin HF Cardiology: Dr. Shirlee Latch  Reason for Visit: Heart Failure Follow-up HPI: 68 y.o. with history of ETOH abuse HTN, chronic systolic CHF. nonischemic cardiomyopathy.   HF dates back to 2021. Echo 3/21 EF < 20% with severe RV dysfunction.  RHC/LHC in 3/21 showed no CAD but elevated R > L filling pressures, preserved cardiac index.  Cardiac MRI 4/21: LVEF 14%, RVEF 16%, no LGE  EF had improved to 50-55% in 5/23.  Echo (8/24): EF 25-30%, GIDD, RV normal. Suspected drop in EF 2/2 med noncompliance and ETOH.   Echo (12/24): 25-30%  Seen by EP  1/25 CRT-D evaluation. EP felt that there might not be benefit of CRT-D. Did appear that LBBB is rate dependent, may better benefit from reduced rate.   He was readmitted 2/1-01/15/24 with fall resulting in left hip fracture s/p left hip arthroplasty. Course c/b anemia and hypotension. Workup c/w adrenal insufficiency. Discharged home on hydrocortisone. Entresto, Toprol XL and spironolactone were discontinued.  He returns today for heart failure follow up with paramedicine. Overall feeling well. NYHA II. Reports fatigue. Ambulating with cane. Has finished home PT and now is being set up with OP PT. Wife recently has bowel resection so he has missed some medication here and there while caring for her, sometimes misses evening doses of medications. Denies chest pain, dyspnea, near-syncope, orthopnea, palpitations, and dizziness. Able to perform ADLs. Appetite okay. Remains drinking ETOH significantly.   FH: Sister with CHF, brother with pacemaker, mother with CHF.   SH: Married, lives in [place], heavy ETOH in the past has now cut back, no drugs.  He was a Estate agent.   ROS: All systems reviewed and negative except as per HPI.   Current Outpatient Medications  Medication Sig Dispense Refill   docusate sodium (COLACE) 100 MG capsule Take 1 capsule (100 mg total) by  mouth 2 (two) times daily. 10 capsule 0   empagliflozin (JARDIANCE) 10 MG TABS tablet Take 1 tablet (10 mg total) by mouth daily before breakfast. 90 tablet 3   ferrous sulfate 325 (65 FE) MG tablet Take 1 tablet (325 mg total) by mouth daily with breakfast. 30 tablet 3   furosemide (LASIX) 40 MG tablet Take 1 tablet (40 mg total) by mouth daily. 90 tablet 3   hydrocortisone (CORTEF) 10 MG tablet Take 1 tablet (10 mg total) by mouth daily. Take in the morning at 7 am. (Patient taking differently: Patient takes 1 tablet in the morning and 0.5 tablet in the evening.) 30 tablet 0   loperamide (IMODIUM) 2 MG capsule Take 2 mg by mouth every 6 (six) hours as needed for diarrhea or loose stools.     losartan (COZAAR) 25 MG tablet Take 1 tablet (25 mg total) by mouth daily. 90 tablet 0   Multiple Vitamin (MULTIVITAMIN) tablet Take 1 tablet by mouth daily.     senna (SENOKOT) 8.6 MG TABS tablet Take 1 tablet (8.6 mg total) by mouth 2 (two) times daily. 120 tablet 0   spironolactone (ALDACTONE) 25 MG tablet Take 1 tablet (25 mg total) by mouth daily. 60 tablet 3   No current facility-administered medications for this encounter.   Wt Readings from Last 3 Encounters:  03/11/24 70.8 kg (156 lb)  03/02/24 68.8 kg (151 lb 9.6 oz)  02/24/24 65.7 kg (144 lb 12.8 oz)   BP 128/78   Pulse (!) 107   Wt 70.8 kg (156 lb)  SpO2 94%   BMI 23.04 kg/m   Physical Exam General: Thin appearing. No distress on RA. Walking with cane Cardiac: JVP midneck. S1 and S2 present. No murmurs or rub. Abdomen: Flat, non-tender, non-distended.  Extremities: Warm and dry.  To peripheral edema.  Neuro: Alert and oriented x3. Affect pleasant. Moves all extremities without difficulty.  ReDs reading: 44 %, abnormal  ECG (personally reviewed): ST 110 bpm, LBBB 154 ms QRS  Assessment/Plan: 1. Chronic systolic CHF: NICM, possibly due to ETOH.  Echo 3/21 EF 20%, moderately decreased RV.  RHC/LHC 3/21 no significant coronary  disease, preserved cardiac output, R>L heart failure.  Cardiac MRI in 4/21 showed EF 14%, severe RV dysfunction. No LGE but difficult LGE images, cannot rule out prior myocarditis.  TEE in 5/23 showed EF 50-55%, normal RV, trivial MR, mobile RA structure seen on TTE was [facility]. Echo in 8/24 with drop in EF to 25-30% in setting of heavy ETOH intake and noncompliance with meds. Echo 12/24 EF 25-30% on GDMT. He has cut back some on ETOH but is still drinking.  - NYHA II. Mobility limited by recent hip fracture, also appears to under report symptoms. Volume up by exam and Reds - Need to get back on GDMT. Most of meds stopped during recent hospitalization. - Increase lasix to 60 mg daily. BNP/BMET today and repeat in 10 days. - Start Toprol XL 25 mg daily, can uptitrate at next appointment for HR reduction - Continue losartan 25 mg daily, try to get back on entresto in future.  - Continue jardiance 10 mg daily.  - Increase spiro 25 mg daily - Encouraged to continue to cut back on ETOH use.   - Seen by Dr. Graciela Husbands for CRT-D. Not sure that with age whether ICD would be beneficial for mortality. It was thought that LBBB was more rate depend and would benefit from better rate control. Beta blocker next as above.  2. LBBB:  - Repeat Echo 12/24: 25-30% after compliance with medications and cutting back on ETOH. - See above CRT-D discussion  3. ETOH abuse: Long history of heavy ETOH. This may be the cause of his cardiomyopathy.  He is still drinking but adamant it is much less.  4. Hyponatremia: In setting of beer intake. Last Na 128 last visit. - BMET today  5. Adrenal insufficiency: Low am cortisol during recent admit. He was placed on hydrocortisone - Refer to endocrine. Has PCP appointment  Follow up with APP in 4 weeks (volume and GDTM titration)  [place] Kaleah Hagemeister, NP 03/11/2024

## 2024-03-10 NOTE — Telephone Encounter (Addendum)
 Called and spoke to pt's wife Lorie to confirm/remind patient of their appointment at the [facility] on 03/11/24.   Appointment:   [x] Confirmed  [] Left mess   [] No answer/No voice mail  [] Phone not in service  Patient reminded to bring all medications and/or complete list.  Confirmed patient has transportation. Gave directions, instructed to utilize valet parking.

## 2024-03-11 ENCOUNTER — Emergency Department (HOSPITAL_COMMUNITY)

## 2024-03-11 ENCOUNTER — Telehealth (HOSPITAL_COMMUNITY): Payer: Self-pay

## 2024-03-11 ENCOUNTER — Other Ambulatory Visit: Payer: Self-pay

## 2024-03-11 ENCOUNTER — Inpatient Hospital Stay (HOSPITAL_COMMUNITY)
Admission: EM | Admit: 2024-03-11 | Discharge: 2024-03-16 | DRG: 291 | Disposition: A | Discharge status: Home / Self Care | Attending: Internal Medicine | Admitting: Internal Medicine

## 2024-03-11 ENCOUNTER — Ambulatory Visit (HOSPITAL_BASED_OUTPATIENT_CLINIC_OR_DEPARTMENT_OTHER)
Admission: RE | Admit: 2024-03-11 | Discharge: 2024-03-11 | Disposition: A | Discharge status: Home / Self Care | Source: Ambulatory Visit | Attending: Cardiology | Admitting: Cardiology

## 2024-03-11 ENCOUNTER — Telehealth (HOSPITAL_COMMUNITY): Payer: Self-pay | Admitting: Cardiology

## 2024-03-11 ENCOUNTER — Encounter (HOSPITAL_COMMUNITY): Payer: Self-pay

## 2024-03-11 VITALS — BP 128/78 | HR 107 | Wt 156.0 lb

## 2024-03-11 DIAGNOSIS — I426 Alcoholic cardiomyopathy: Secondary | ICD-10-CM | POA: Diagnosis present

## 2024-03-11 DIAGNOSIS — Z8249 Family history of ischemic heart disease and other diseases of the circulatory system: Secondary | ICD-10-CM

## 2024-03-11 DIAGNOSIS — E871 Hypo-osmolality and hyponatremia: Secondary | ICD-10-CM | POA: Insufficient documentation

## 2024-03-11 DIAGNOSIS — K625 Hemorrhage of anus and rectum: Secondary | ICD-10-CM | POA: Insufficient documentation

## 2024-03-11 DIAGNOSIS — I5023 Acute on chronic systolic (congestive) heart failure: Secondary | ICD-10-CM | POA: Diagnosis not present

## 2024-03-11 DIAGNOSIS — I517 Cardiomegaly: Secondary | ICD-10-CM | POA: Diagnosis not present

## 2024-03-11 DIAGNOSIS — Z79899 Other long term (current) drug therapy: Secondary | ICD-10-CM | POA: Insufficient documentation

## 2024-03-11 DIAGNOSIS — N179 Acute kidney failure, unspecified: Secondary | ICD-10-CM | POA: Diagnosis not present

## 2024-03-11 DIAGNOSIS — I10 Essential (primary) hypertension: Secondary | ICD-10-CM | POA: Diagnosis not present

## 2024-03-11 DIAGNOSIS — Z7984 Long term (current) use of oral hypoglycemic drugs: Secondary | ICD-10-CM

## 2024-03-11 DIAGNOSIS — I110 Hypertensive heart disease with heart failure: Secondary | ICD-10-CM | POA: Diagnosis not present

## 2024-03-11 DIAGNOSIS — I428 Other cardiomyopathies: Principal | ICD-10-CM

## 2024-03-11 DIAGNOSIS — I447 Left bundle-branch block, unspecified: Secondary | ICD-10-CM | POA: Diagnosis present

## 2024-03-11 DIAGNOSIS — I5021 Acute systolic (congestive) heart failure: Secondary | ICD-10-CM | POA: Diagnosis not present

## 2024-03-11 DIAGNOSIS — R748 Abnormal levels of other serum enzymes: Secondary | ICD-10-CM | POA: Insufficient documentation

## 2024-03-11 DIAGNOSIS — T501X6A Underdosing of loop [high-ceiling] diuretics, initial encounter: Secondary | ICD-10-CM | POA: Diagnosis present

## 2024-03-11 DIAGNOSIS — K7460 Unspecified cirrhosis of liver: Secondary | ICD-10-CM | POA: Diagnosis present

## 2024-03-11 DIAGNOSIS — R570 Cardiogenic shock: Secondary | ICD-10-CM | POA: Diagnosis present

## 2024-03-11 DIAGNOSIS — E2740 Unspecified adrenocortical insufficiency: Secondary | ICD-10-CM | POA: Diagnosis present

## 2024-03-11 DIAGNOSIS — Z91148 Patient's other noncompliance with medication regimen for other reason: Secondary | ICD-10-CM

## 2024-03-11 DIAGNOSIS — F1010 Alcohol abuse, uncomplicated: Secondary | ICD-10-CM | POA: Insufficient documentation

## 2024-03-11 DIAGNOSIS — R7401 Elevation of levels of liver transaminase levels: Secondary | ICD-10-CM

## 2024-03-11 DIAGNOSIS — R000 Tachycardia, unspecified: Secondary | ICD-10-CM | POA: Diagnosis present

## 2024-03-11 DIAGNOSIS — R0602 Shortness of breath: Secondary | ICD-10-CM | POA: Diagnosis not present

## 2024-03-11 DIAGNOSIS — I5022 Chronic systolic (congestive) heart failure: Secondary | ICD-10-CM | POA: Diagnosis not present

## 2024-03-11 DIAGNOSIS — Z23 Encounter for immunization: Secondary | ICD-10-CM

## 2024-03-11 DIAGNOSIS — I509 Heart failure, unspecified: Secondary | ICD-10-CM | POA: Diagnosis not present

## 2024-03-11 DIAGNOSIS — Z91040 Latex allergy status: Secondary | ICD-10-CM | POA: Diagnosis not present

## 2024-03-11 DIAGNOSIS — K5900 Constipation, unspecified: Secondary | ICD-10-CM | POA: Diagnosis not present

## 2024-03-11 DIAGNOSIS — F1090 Alcohol use, unspecified, uncomplicated: Secondary | ICD-10-CM | POA: Diagnosis present

## 2024-03-11 DIAGNOSIS — J90 Pleural effusion, not elsewhere classified: Secondary | ICD-10-CM | POA: Diagnosis not present

## 2024-03-11 DIAGNOSIS — J9811 Atelectasis: Secondary | ICD-10-CM | POA: Diagnosis present

## 2024-03-11 DIAGNOSIS — Z96642 Presence of left artificial hip joint: Secondary | ICD-10-CM | POA: Diagnosis present

## 2024-03-11 DIAGNOSIS — D649 Anemia, unspecified: Secondary | ICD-10-CM | POA: Insufficient documentation

## 2024-03-11 LAB — CBC WITH DIFFERENTIAL/PLATELET
Abs Immature Granulocytes: 0.06 10*3/uL (ref 0.00–0.07)
Basophils Absolute: 0 10*3/uL (ref 0.0–0.1)
Basophils Relative: 0 %
Eosinophils Absolute: 0 10*3/uL (ref 0.0–0.5)
Eosinophils Relative: 0 %
HCT: 33.6 % — ABNORMAL LOW (ref 39.0–52.0)
Hemoglobin: 11.8 g/dL — ABNORMAL LOW (ref 13.0–17.0)
Immature Granulocytes: 1 %
Lymphocytes Relative: 7 %
Lymphs Abs: 0.7 10*3/uL (ref 0.7–4.0)
MCH: 32.3 pg (ref 26.0–34.0)
MCHC: 35.1 g/dL (ref 30.0–36.0)
MCV: 92.1 fL (ref 80.0–100.0)
Monocytes Absolute: 1.1 10*3/uL — ABNORMAL HIGH (ref 0.1–1.0)
Monocytes Relative: 11 %
Neutro Abs: 8.2 10*3/uL — ABNORMAL HIGH (ref 1.7–7.7)
Neutrophils Relative %: 81 %
Platelets: 221 10*3/uL (ref 150–400)
RBC: 3.65 MIL/uL — ABNORMAL LOW (ref 4.22–5.81)
RDW: 15.9 % — ABNORMAL HIGH (ref 11.5–15.5)
WBC: 10.1 10*3/uL (ref 4.0–10.5)
nRBC: 0 % (ref 0.0–0.2)

## 2024-03-11 LAB — HEPATIC FUNCTION PANEL
ALT: 61 U/L — ABNORMAL HIGH (ref 0–44)
AST: 132 U/L — ABNORMAL HIGH (ref 15–41)
Albumin: 3.2 g/dL — ABNORMAL LOW (ref 3.5–5.0)
Alkaline Phosphatase: 110 U/L (ref 38–126)
Bilirubin, Direct: 1 mg/dL — ABNORMAL HIGH (ref 0.0–0.2)
Indirect Bilirubin: 1.6 mg/dL — ABNORMAL HIGH (ref 0.3–0.9)
Total Bilirubin: 2.6 mg/dL — ABNORMAL HIGH (ref 0.0–1.2)
Total Protein: 7.1 g/dL (ref 6.5–8.1)

## 2024-03-11 LAB — BASIC METABOLIC PANEL WITH GFR
Anion gap: 19 — ABNORMAL HIGH (ref 5–15)
Anion gap: 18 — ABNORMAL HIGH (ref 5–15)
BUN: 13 mg/dL (ref 8–23)
BUN: 13 mg/dL (ref 8–23)
CO2: 14 mmol/L — ABNORMAL LOW (ref 22–32)
CO2: 17 mmol/L — ABNORMAL LOW (ref 22–32)
Calcium: 8.7 mg/dL — ABNORMAL LOW (ref 8.9–10.3)
Calcium: 8.7 mg/dL — ABNORMAL LOW (ref 8.9–10.3)
Chloride: 87 mmol/L — ABNORMAL LOW (ref 98–111)
Chloride: 85 mmol/L — ABNORMAL LOW (ref 98–111)
Creatinine, Ser: 1.47 mg/dL — ABNORMAL HIGH (ref 0.61–1.24)
Creatinine, Ser: 1.5 mg/dL — ABNORMAL HIGH (ref 0.61–1.24)
GFR, Estimated: 52 mL/min — ABNORMAL LOW (ref >60)
GFR, Estimated: 50 mL/min — ABNORMAL LOW (ref >60)
Glucose, Bld: 105 mg/dL — ABNORMAL HIGH (ref 70–99)
Glucose, Bld: 107 mg/dL — ABNORMAL HIGH (ref 70–99)
Potassium: 4.5 mmol/L (ref 3.5–5.1)
Potassium: 4 mmol/L (ref 3.5–5.1)
Sodium: 120 mmol/L — ABNORMAL LOW (ref 135–145)
Sodium: 120 mmol/L — ABNORMAL LOW (ref 135–145)

## 2024-03-11 LAB — I-STAT CG4 LACTIC ACID, ED: Lactic Acid, Venous: 4.7 mmol/L (ref 0.5–1.9)

## 2024-03-11 LAB — LIPASE, BLOOD: Lipase: 24 U/L (ref 11–51)

## 2024-03-11 LAB — BRAIN NATRIURETIC PEPTIDE
B Natriuretic Peptide: >4500 pg/mL — ABNORMAL HIGH (ref 0.0–100.0)
B Natriuretic Peptide: >4500 pg/mL — ABNORMAL HIGH (ref 0.0–100.0)

## 2024-03-11 LAB — RESP PANEL BY RT-PCR (RSV, FLU A&B, COVID)  RVPGX2
Influenza A by PCR: NEGATIVE
Influenza B by PCR: NEGATIVE
Resp Syncytial Virus by PCR: NEGATIVE
SARS Coronavirus 2 by RT PCR: NEGATIVE

## 2024-03-11 LAB — GLUCOSE, CAPILLARY: Glucose-Capillary: 103 mg/dL — ABNORMAL HIGH (ref 70–99)

## 2024-03-11 MED ORDER — ADULT MULTIVITAMIN W/MINERALS CH
1.0000 | ORAL_TABLET | Freq: Every day | ORAL | Status: DC
  Administered 2024-03-11 – 2024-03-16 (×6): 1 via ORAL
  Filled 2024-03-11 (×6): qty 1

## 2024-03-11 MED ORDER — HYDROCORTISONE 5 MG PO TABS
5.0000 mg | ORAL_TABLET | Freq: Two times a day (BID) | ORAL | Status: DC
  Administered 2024-03-11 – 2024-03-13 (×4): 10 mg via ORAL
  Filled 2024-03-11 (×5): qty 2

## 2024-03-11 MED ORDER — SPIRONOLACTONE 25 MG PO TABS: 25.0000 mg | ORAL_TABLET | Freq: Every day | ORAL | 3 refills | Status: DC

## 2024-03-11 MED ORDER — LORAZEPAM 2 MG/ML IJ SOLN: 1.0000 mg | INTRAMUSCULAR | Status: DC | PRN

## 2024-03-11 MED ORDER — POTASSIUM CHLORIDE CRYS ER 20 MEQ PO TBCR
40.0000 meq | EXTENDED_RELEASE_TABLET | Freq: Once | ORAL | Status: AC
  Administered 2024-03-11: 40 meq via ORAL
  Filled 2024-03-11: qty 2

## 2024-03-11 MED ORDER — FUROSEMIDE 10 MG/ML IJ SOLN
80.0000 mg | Freq: Once | INTRAMUSCULAR | Status: AC
  Administered 2024-03-11: 80 mg via INTRAVENOUS
  Filled 2024-03-11: qty 8

## 2024-03-11 MED ORDER — BISACODYL 5 MG PO TBEC: 5.0000 mg | DELAYED_RELEASE_TABLET | Freq: Every day | ORAL | Status: DC | PRN

## 2024-03-11 MED ORDER — SENNA 8.6 MG PO TABS: 1.0000 | ORAL_TABLET | Freq: Every day | ORAL | Status: DC | PRN

## 2024-03-11 MED ORDER — HEPARIN SODIUM (PORCINE) 5000 UNIT/ML IJ SOLN
5000.0000 [IU] | Freq: Three times a day (TID) | INTRAMUSCULAR | Status: DC
  Administered 2024-03-11 – 2024-03-15 (×13): 5000 [IU] via SUBCUTANEOUS
  Filled 2024-03-11 (×13): qty 1

## 2024-03-11 MED ORDER — FUROSEMIDE 40 MG PO TABS: 60.0000 mg | ORAL_TABLET | Freq: Every day | ORAL | 3 refills | Status: AC

## 2024-03-11 MED ORDER — ACETAMINOPHEN 325 MG PO TABS
650.0000 mg | ORAL_TABLET | Freq: Once | ORAL | Status: AC
  Administered 2024-03-11: 650 mg via ORAL
  Filled 2024-03-11: qty 2

## 2024-03-11 MED ORDER — FOLIC ACID 1 MG PO TABS
1.0000 mg | ORAL_TABLET | Freq: Every day | ORAL | Status: DC
  Administered 2024-03-11 – 2024-03-16 (×6): 1 mg via ORAL
  Filled 2024-03-11 (×6): qty 1

## 2024-03-11 MED ORDER — SODIUM CHLORIDE 0.9% FLUSH: 3.0000 mL | INTRAVENOUS | Status: DC | PRN

## 2024-03-11 MED ORDER — METOPROLOL SUCCINATE ER 25 MG PO TB24: 25.0000 mg | ORAL_TABLET | Freq: Every day | ORAL | 4 refills | Status: DC

## 2024-03-11 MED ORDER — SODIUM CHLORIDE 0.9 % IV SOLN: 250.0000 mL | INTRAVENOUS | Status: AC | PRN

## 2024-03-11 MED ORDER — INFLUENZA VAC A&B SURF ANT ADJ 0.5 ML IM SUSY
0.5000 mL | PREFILLED_SYRINGE | INTRAMUSCULAR | Status: AC
  Administered 2024-03-13: 0.5 mL via INTRAMUSCULAR
  Filled 2024-03-11: qty 0.5

## 2024-03-11 MED ORDER — ACETAMINOPHEN 500 MG PO TABS
500.0000 mg | ORAL_TABLET | Freq: Four times a day (QID) | ORAL | Status: DC | PRN
Start: 2024-03-11 — End: 2024-03-16

## 2024-03-11 MED ORDER — THIAMINE HCL 100 MG/ML IJ SOLN
100.0000 mg | Freq: Every day | INTRAMUSCULAR | Status: DC
  Filled 2024-03-11: qty 2

## 2024-03-11 MED ORDER — LOSARTAN POTASSIUM 50 MG PO TABS
25.0000 mg | ORAL_TABLET | Freq: Every day | ORAL | Status: DC
  Administered 2024-03-11: 25 mg via ORAL
  Filled 2024-03-11: qty 1

## 2024-03-11 MED ORDER — MILRINONE LACTATE IN DEXTROSE 20-5 MG/100ML-% IV SOLN
0.2500 ug/kg/min | INTRAVENOUS | Status: DC
  Administered 2024-03-11 – 2024-03-13 (×5): 0.25 ug/kg/min via INTRAVENOUS
  Filled 2024-03-11 (×5): qty 100

## 2024-03-11 MED ORDER — LORAZEPAM 1 MG PO TABS: 1.0000 mg | ORAL_TABLET | ORAL | Status: DC | PRN

## 2024-03-11 MED ORDER — PNEUMOCOCCAL 20-VAL CONJ VACC 0.5 ML IM SUSY
0.5000 mL | PREFILLED_SYRINGE | INTRAMUSCULAR | Status: AC
  Administered 2024-03-15: 0.5 mL via INTRAMUSCULAR
  Filled 2024-03-11 (×2): qty 0.5

## 2024-03-11 MED ORDER — FUROSEMIDE 10 MG/ML IJ SOLN
80.0000 mg | Freq: Two times a day (BID) | INTRAMUSCULAR | Status: DC
  Administered 2024-03-12 – 2024-03-14 (×6): 80 mg via INTRAVENOUS
  Filled 2024-03-11 (×7): qty 8

## 2024-03-11 MED ORDER — SODIUM CHLORIDE 0.9% FLUSH
3.0000 mL | Freq: Two times a day (BID) | INTRAVENOUS | Status: DC
  Administered 2024-03-11 – 2024-03-16 (×10): 3 mL via INTRAVENOUS

## 2024-03-11 MED ORDER — THIAMINE MONONITRATE 100 MG PO TABS
100.0000 mg | ORAL_TABLET | Freq: Every day | ORAL | Status: DC
  Administered 2024-03-11 – 2024-03-15 (×5): 100 mg via ORAL
  Filled 2024-03-11 (×6): qty 1

## 2024-03-11 NOTE — ED Provider Notes (Signed)
 [place] EMERGENCY DEPARTMENT AT [facility] Provider Note   CSN: [id number] Arrival date & time: 03/11/24  1320     History {Add pertinent medical, surgical, social history, OB history to HPI:1} Chief Complaint  Patient presents with   Abnormal Lab   HPI Austin [name] is a 68 y.o. male with history of CHF, nonischemic cardiomyopathy, hypertension, history of alcohol use disorder presenting for abnormal lab.  His wife reports that he is been vomiting for the last couple of days, no diarrhea or nausea or abdominal pain or fever.  For this reason states that he has not been taking his Lasix.  He denies chest pain or shortness of breath.  Was seen by his cardiologist earlier today and his BNP was found to be greater than 4500.   Abnormal Lab      Home Medications Prior to Admission medications   Medication Sig Start Date End Date Taking? Authorizing Provider  docusate sodium (COLACE) 100 MG capsule Take 1 capsule (100 mg total) by mouth 2 (two) times daily. 01/15/24   Regalado, Belkys A, MD  empagliflozin (JARDIANCE) 10 MG TABS tablet Take 1 tablet (10 mg total) by mouth daily before breakfast. 09/17/23   Laurey Morale, MD  ferrous sulfate 325 (65 FE) MG tablet Take 1 tablet (325 mg total) by mouth daily with breakfast. 01/16/24   Regalado, Belkys A, MD  furosemide (LASIX) 40 MG tablet Take 1.5 tablets (60 mg total) by mouth daily. 03/11/24   Lee, [place], NP  hydrocortisone (CORTEF) 10 MG tablet Take 1 tablet (10 mg total) by mouth daily. Take in the morning at 7 am. Patient taking differently: Patient takes 1 tablet in the morning and 0.5 tablet in the evening. 02/18/24   [name], FNP  loperamide (IMODIUM) 2 MG capsule Take 2 mg by mouth every 6 (six) hours as needed for diarrhea or loose stools. 10/28/23   [provider]  losartan (COZAAR) 25 MG tablet Take 1 tablet (25 mg total) by mouth daily. 02/13/24 05/13/24  Andrey Farmer, PA-C  metoprolol succinate  (TOPROL XL) 25 MG 24 hr tablet Take 1 tablet (25 mg total) by mouth daily. 03/11/24   Lee, [place], NP  Multiple Vitamin (MULTIVITAMIN) tablet Take 1 tablet by mouth daily.    [provider]  senna (SENOKOT) 8.6 MG TABS tablet Take 1 tablet (8.6 mg total) by mouth 2 (two) times daily. 01/15/24   Regalado, Belkys A, MD  spironolactone (ALDACTONE) 25 MG tablet Take 1 tablet (25 mg total) by mouth daily. 03/11/24   Lee, [place], NP      Allergies    Latex    Review of Systems   See HPI  Physical Exam Updated Vital Signs BP (!) 120/92 (BP Location: Right Arm)   Pulse (!) 109   Temp 97.8 F (36.6 C) (Oral)   Resp 18   Ht 5\' 9"  (1.753 m)   Wt 70.8 kg   SpO2 100%   BMI 23.05 kg/m  Physical Exam  ED Results / Procedures / Treatments   Labs (all labs ordered are listed, but only abnormal results are displayed) Labs Reviewed  RESP PANEL BY RT-PCR (RSV, FLU A&B, COVID)  RVPGX2  BASIC METABOLIC PANEL WITH GFR  BRAIN NATRIURETIC PEPTIDE  CBC WITH DIFFERENTIAL/PLATELET  HEPATIC FUNCTION PANEL    EKG EKG Interpretation Date/Time:  Thursday March 11 2024 13:29:27 EDT Ventricular Rate:  108 PR Interval:  216 QRS Duration:  152 QT Interval:  418 QTC Calculation: 560 R Axis:   231  Text Interpretation: Sinus tachycardia with 1st degree A-V block Right superior axis deviation Non-specific intra-ventricular conduction block Abnormal ECG When compared with ECG of 11-Mar-2024 09:27, PREVIOUS ECG IS PRESENT Confirmed by Alona Bene [phone]) on 03/11/2024 1:36:34 PM  Radiology No results found.  Procedures Procedures  {Document cardiac monitor, telemetry assessment procedure when appropriate:1}  Medications Ordered in ED Medications - No data to display  ED Course/ Medical Decision Making/ A&P   {   Click here for ABCD2, HEART and other calculatorsREFRESH Note before signing :1}                              Medical Decision Making Amount and/or Complexity of Data  Reviewed Labs: ordered. Radiology: ordered.   ***  {Document critical care time when appropriate:1} {Document review of labs and clinical decision tools ie heart score, Chads2Vasc2 etc:1}  {Document your independent review of radiology images, and any outside records:1} {Document your discussion with family members, caretakers, and with consultants:1} {Document social determinants of health affecting pt's care:1} {Document your decision making why or why not admission, treatments were needed:1} Final Clinical Impression(s) / ED Diagnoses Final diagnoses:  None    Rx / DC Orders ED Discharge Orders     None

## 2024-03-11 NOTE — Telephone Encounter (Signed)
 As per Brita Romp NP spoke to patient's sister Tyler Aas about lab work and that he should go to the ER to be evaluated.Understands and is to go to ER

## 2024-03-11 NOTE — ED Notes (Signed)
 Followed up with Lab regarding add-ons.  Lab reports they are getting ready to run the add-ons.

## 2024-03-11 NOTE — ED Notes (Signed)
 Lab made aware of Lipase and BNP add-on.

## 2024-03-11 NOTE — Progress Notes (Signed)
 ReDS Vest / Clip - 03/11/24 0900       ReDS Vest / Clip   Station Marker C    Ruler Value 23.5    ReDS Value Range High volume overload    ReDS Actual Value 44

## 2024-03-11 NOTE — TOC Initial Note (Signed)
 Transition of Care [facility]) - Initial/Assessment Note    Patient Details  Name: Austin [name] MRN: [id number] Date of Birth: [birth year]  [facility] CM/SW Contact:    Elliot Cousin, RN Phone Number: [phone] 03/11/2024, 5:35 PM  Clinical Narrative:                 TOC CM spoke to pt and gave permission to speak to his emergency contact, wife, [name], for additional information. States he was independent at home. Drives to his appts. Has RW and cane at home. Does his daily weights.     Expected Discharge Plan: Home/Self Care Barriers to Discharge: Continued Medical Work up   Patient Goals and CMS Choice Patient states their goals for this hospitalization and ongoing recovery are:: wants to remain independent          Expected Discharge Plan and Services   Discharge Planning Services: CM Consult   Living arrangements for the past 2 months: Single Family Home                                      Prior Living Arrangements/Services Living arrangements for the past 2 months: Single Family Home Lives with:: Spouse Patient language and need for interpreter reviewed:: Yes Do you feel safe going back to the place where you live?: Yes      Need for Family Participation in Patient Care: No (Comment) Care giver support system in place?: Yes (comment) Current home services: DME (cane, RW, scale)    Activities of Daily Living      Permission Sought/Granted Permission sought to share information with : Case Manager, Family Supports, PCP Permission granted to share information with : Yes, Verbal Permission Granted  Share Information with NAME: Kreed Kauffman  Permission granted to share info w AGENCY: PCP  Permission granted to share info w Relationship: wife  Permission granted to share info w Contact Information: [phone]  Emotional Assessment   Attitude/Demeanor/Rapport: Engaged Affect (typically observed): Accepting Orientation: :  Oriented to Self, Oriented to Place, Oriented to  Time, Oriented to Situation   Psych Involvement: No (comment)  Admission diagnosis:  Abn Labs Patient Active Problem List   Diagnosis Date Noted   ABLA (acute blood loss anemia) 01/13/2024   Adrenal insufficiency (HCC) 01/12/2024   Closed left hip fracture (HCC) 01/10/2024   Transient hypotension 06/05/2023   Alcohol use disorder 06/05/2023   Hypertensive 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 [place] [address] Phone: [phone] Fax: [phone]  [facility] [address] [place] [address] Phone: [phone] Fax: [phone]     Social Drivers of Health (SDOH) Social History: SDOH Screenings   Food Insecurity: No Food Insecurity (02/11/2024)  Housing: Low Risk  (02/11/2024)  Transportation Needs: No Transportation Needs (02/11/2024)  Utilities: Not At Risk (02/11/2024)  Alcohol Screen: Low Risk  (  02/11/2024)  Depression (PHQ2-9): Low Risk  (02/11/2024)  Financial Resource Strain: Low Risk  (02/11/2024)  Physical Activity: Insufficiently Active (02/11/2024)  Social Connections: Socially Integrated (02/11/2024)  Stress: No Stress Concern Present (02/11/2024)  Tobacco Use: Low Risk  (03/11/2024)  Health Literacy: Adequate Health Literacy (02/11/2024)   SDOH Interventions:     Readmission Risk Interventions    01/15/2024   12:10 PM 01/12/2024    1:06 PM  Readmission Risk  Prevention Plan  Transportation Screening Complete Complete  PCP or Specialist Appt within 5-7 Days  Complete  Home Care Screening  Complete  Medication Review (RN CM)  Complete

## 2024-03-11 NOTE — ED Notes (Signed)
 Pt. Lactic flagged alaina c,rn notified by at

## 2024-03-11 NOTE — ED Triage Notes (Addendum)
 Patient arrives after called by cardiologist whom he saw this morning and advised to come to the ED. Several abnormal labs on panels drawn this am, including BNP >4500. Denies sob or chest pain. Reports not taking Lasix x 3 days.

## 2024-03-11 NOTE — ED Provider Notes (Signed)
 Patient sent to the emergency department by the congestive heart failure clinic for evaluation.  Patient's sodium is low at 120.  Patient's care assumed by me at 3:30 PM pending return of laboratory evaluation.  Cardiology has been consulted they have recommended medicine admission.  Patient's laboratory evaluations returned.  Patient's albumin is 3.2.  Total bilirubin is 2.6.  His BNP is still pending.  This was tested earlier today and was greater than 4500.  I consulted hospitalist who will see patient for admission.   Elson Areas, [place] 03/11/24 1834    Wynetta Fines, MD 03/12/24 0000

## 2024-03-11 NOTE — H&P (Addendum)
 History and Physical    Patient: Austin [name] AOZ:[id number] DOB: [birth year] DOA: 03/11/2024 DOS: the patient was seen and examined on 03/11/2024 PCP: [name], FNP  Patient coming from: Home  Chief Complaint:  Chief Complaint  Patient presents with   Abnormal Lab   HPI: Austin [name] is a 68 y.o. 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  The patient was last hospitalized 2 months ago after falling and sustaining a left hip fracture.  He had a hip replacement but his hospital stay was complicated by operative anemia and hypotension. He did require blood transfusion but he was also found to be adrenally insufficient and started on hydrocortisone.  The patient has been working with physical therapy since his hip replacement surgery.   He says that has been a little bit of a struggle and he has not gotten back to walking well yet.  He denied any chest pain or shortness of breath to me.  He says today's clinic visit was previously scheduled and he was surprised when they told him to go to the ED   Review of Systems: As mentioned in the history of present illness. All other systems reviewed and are negative. Past Medical History:  Diagnosis Date   Arthritis    hands   CHF (congestive heart failure) (HCC)    GERD (gastroesophageal reflux disease)    Hypertension    Hypomagnesemia    Hyponatremia    Prostate cancer [facility])    Past Surgical History:  Procedure Laterality Date   LYMPHADENECTOMY Bilateral 01/26/2014   Procedure: LYMPHADENECTOMY WITH INDOCYANINE GREEN DYE;  Surgeon: Sebastian Ache, MD;  [facility];  Service: Urology;  Laterality: Bilateral;   RIGHT/LEFT HEART CATH AND CORONARY ANGIOGRAPHY N/A 02/25/2020   Procedure: RIGHT/LEFT HEART CATH AND CORONARY ANGIOGRAPHY;  Surgeon: Kathleene Hazel, MD;  [facility];  Service: Cardiovascular;  Laterality: N/A;   ROBOT ASSISTED LAPAROSCOPIC RADICAL PROSTATECTOMY N/A 01/26/2014   Procedure: ROBOTIC ASSISTED LAPAROSCOPIC RADICAL PROSTATECTOMY;  Surgeon: Sebastian Ache, MD;  Location: [facility];  Service: Urology;  Laterality: N/A;   SHOULDER SURGERY Left    TEE WITHOUT CARDIOVERSION N/A 04/15/2022   Procedure: TRANSESOPHAGEAL ECHOCARDIOGRAM (TEE);  Surgeon: Laurey Morale, MD;  Location[facility] ENDOSCOPY;  Service: Cardiovascular;  Laterality: N/A;   TOTAL HIP ARTHROPLASTY Left 01/11/2024   Procedure: TOTAL HIP ARTHROPLASTY ANTERIOR APPROACH;  Surgeon: Samson Frederic, MD;  Location: [facility];  Service: Orthopedics;  Laterality: Left;   Social History:  reports that he has never smoked. He has never used smokeless tobacco. He reports that he does not currently use alcohol. He reports that he does not use drugs.  Allergies  Allergen Reactions   Latex Swelling and Rash    Family History  Problem Relation Age of Onset   Hypertension Mother    Hypertension Father     Prior to Admission medications   Medication Sig Start Date End Date Taking? Authorizing Provider  docusate sodium (COLACE) 100 MG capsule Take 1 capsule (100 mg total) by mouth 2 (two) times daily. Patient taking differently: Take 100 mg by mouth daily as needed for mild constipation or moderate constipation. 01/15/24  Yes Regalado, Belkys A, MD  empagliflozin (JARDIANCE) 10 MG TABS tablet Take 1 tablet (10 mg total) by mouth daily before breakfast. 09/17/23  Yes Laurey Morale, MD  ferrous sulfate 325 (65 FE) MG tablet Take 1 tablet (325 mg total) by mouth daily with breakfast. 01/16/24  Yes Regalado, Belkys A, MD  furosemide (LASIX) 40 MG tablet Take 1.5 tablets (60 mg total) by mouth daily. 03/11/24  Yes Lee, [place], NP  hydrocortisone (CORTEF) 10 MG tablet Take 1 tablet (10 mg total) by mouth daily. Take in the morning at 7 am. Patient taking differently:  Take 5-10 mg by mouth 2 (two) times daily. Take 10mg  (one tablet) at 7:00am and 5mg  (one-half tablet) at 2:00pm for a total daily dose of 15mg . 02/18/24  Yes [name], FNP  loperamide (IMODIUM) 2 MG capsule Take 2 mg by mouth every 6 (six) hours as needed for diarrhea or loose stools. 10/28/23  Yes [provider]  losartan (COZAAR) 25 MG tablet Take 1 tablet (25 mg total) by mouth daily. 02/13/24 05/13/24 Yes Andrey Farmer, PA-C  metoprolol succinate (TOPROL XL) 25 MG 24 hr tablet Take 1 tablet (25 mg total) by mouth daily. 03/11/24  Yes Lee, [place], NP  Multiple Vitamin (MULTIVITAMIN) tablet Take 1 tablet by mouth daily.   Yes [provider]  senna (SENOKOT) 8.6 MG TABS tablet Take 1 tablet (8.6 mg total) by mouth 2 (two) times daily. Patient taking differently: Take 1 tablet by mouth daily as needed for mild constipation. 01/15/24  Yes Regalado, Belkys A, MD  spironolactone (ALDACTONE) 25 MG tablet Take 1 tablet (25 mg total) by mouth daily. 03/11/24  Yes Lee, [place], NP    Physical Exam: Vitals:   03/11/24 1600 03/11/24 1630 03/11/24 1750 03/11/24 1904  BP: (!) 130/93 (!) 124/95  (!) 125/97  Pulse: 94 88  96  Resp:  18  20  Temp:   (!) 97.4 F (36.3 C) 97.7 F (36.5 C)  TempSrc:   Oral Oral  SpO2: 100% 100%    Weight:      Height:       Physical Exam:  General: No acute distress, chronically ill appearing, sitting up on side of bed HEENT: Normocephalic, atraumatic, PERRL Cardiovascular: Normal rate and rhythm. Distal pulses intact. Pulmonary: Normal pulmonary effort, Absent bs in bases.  Few faint crackles Gastrointestinal: Nondistended abdomen, soft, non-tender, normoactive bowel sounds Musculoskeletal:Normal ROM, no lower ext edema Skin: Skin is warm and dry. Neuro: No focal deficits noted, AAOx3. PSYCH: Attentive and cooperative  Data Reviewed:  Results for orders placed or performed during the hospital encounter of 03/11/24 (from the past 24 hours)   Brain natriuretic peptide     Status: Abnormal   Collection Time: 03/11/24  2:37 PM  Result Value Ref Range   B Natriuretic Peptide >4,500.0 (H) 0.0 - 100.0 pg/mL  Basic metabolic panel     Status: Abnormal   Collection Time: 03/11/24  2:38 PM  Result Value Ref Range   Sodium 120 (L) 135 - 145 mmol/L   Potassium 4.0 3.5 - 5.1 mmol/L   Chloride 85 (L) 98 - 111 mmol/L   CO2 17 (L) 22 - 32 mmol/L   Glucose, Bld 107 (H) 70 - 99 mg/dL   BUN 13 8 - 23 mg/dL   Creatinine, Ser 5.40 (H) 0.61 - 1.24 mg/dL   Calcium 8.7 (L) 8.9 - 10.3 mg/dL   GFR, Estimated 50 (L) >60 mL/min   Anion gap 18 (H) 5 - 15  CBC with Differential     Status: Abnormal   Collection Time: 03/11/24  2:38 PM  Result Value Ref Range   WBC 10.1  4.0 - 10.5 K/uL   RBC 3.65 (L) 4.22 - 5.81 MIL/uL   Hemoglobin 11.8 (L) 13.0 - 17.0 g/dL   HCT 40.9 (L) 81.1 - 91.4 %   MCV 92.1 80.0 - 100.0 fL   MCH 32.3 26.0 - 34.0 pg   MCHC 35.1 30.0 - 36.0 g/dL   RDW 78.2 (H) 95.6 - 21.3 %   Platelets 221 150 - 400 K/uL   nRBC 0.0 0.0 - 0.2 %   Neutrophils Relative % 81 %   Neutro Abs 8.2 (H) 1.7 - 7.7 K/uL   Lymphocytes Relative 7 %   Lymphs Abs 0.7 0.7 - 4.0 K/uL   Monocytes Relative 11 %   Monocytes Absolute 1.1 (H) 0.1 - 1.0 K/uL   Eosinophils Relative 0 %   Eosinophils Absolute 0.0 0.0 - 0.5 K/uL   Basophils Relative 0 %   Basophils Absolute 0.0 0.0 - 0.1 K/uL   Immature Granulocytes 1 %   Abs Immature Granulocytes 0.06 0.00 - 0.07 K/uL  Hepatic function panel     Status: Abnormal   Collection Time: 03/11/24  2:38 PM  Result Value Ref Range   Total Protein 7.1 6.5 - 8.1 g/dL   Albumin 3.2 (L) 3.5 - 5.0 g/dL   AST 086 (H) 15 - 41 U/L   ALT 61 (H) 0 - 44 U/L   Alkaline Phosphatase 110 38 - 126 U/L   Total Bilirubin 2.6 (H) 0.0 - 1.2 mg/dL   Bilirubin, Direct 1.0 (H) 0.0 - 0.2 mg/dL   Indirect Bilirubin 1.6 (H) 0.3 - 0.9 mg/dL  Lipase, blood     Status: None   Collection Time: 03/11/24  2:46 PM  Result Value Ref Range    Lipase 24 11 - 51 U/L  Resp panel by RT-PCR (RSV, Flu A&B, Covid) Anterior Nasal Swab     Status: None   Collection Time: 03/11/24  3:23 PM   Specimen: Anterior Nasal Swab  Result Value Ref Range   SARS Coronavirus 2 by RT PCR NEGATIVE NEGATIVE   Influenza A by PCR NEGATIVE NEGATIVE   Influenza B by PCR NEGATIVE NEGATIVE   Resp Syncytial Virus by PCR NEGATIVE NEGATIVE  I-Stat CG4 Lactic Acid     Status: Abnormal   Collection Time: 03/11/24  5:21 PM  Result Value Ref Range   Lactic Acid, Venous 4.7 (HH) 0.5 - 1.9 mmol/L   Comment NOTIFIED PHYSICIAN      Assessment and Plan: CHF exacerbation with small pleural effusions- - Appreciate recommendations from the heart failure team for low output failure. They have ordered the following: - Milrinone - IV Lasix  - Losartan  - Beta blocker discontinued due to low output  2. S/p left hip replacement 2 months ago - Continue PT/OT  3. ETOH abuse - He tell me his last drink was 4 days ago while watching the NCAA tournament..  He told cardiology that he drinks every day.  Will monitor CIWA scores.  He has no withdrawal symptoms at present.  4. Hyponatremia - Likely due to CHF.  Monitor with diuresis  5. Adrenal insufficiency - now on hydrocortisone   6. AKI -monitor creatinine with diuresis  7.  Elevated transaminases - Monitor with diuresis.  8. Constipation since surgery - Dulcolax   Advance Care Planning:   Code Status: Full Code  The patient names his wife as a surrogate decision maker and wants to be full code.  Consults: Cardiology  Family Communication: None  Severity of Illness:  The appropriate patient status for this patient is INPATIENT. Inpatient status is judged to be reasonable and necessary in order to provide the required intensity of service to ensure the patient's safety. The patient's presenting symptoms, physical exam findings, and initial radiographic and laboratory data in the context of their chronic  comorbidities is felt to place them at high risk for further clinical deterioration. Furthermore, it is not anticipated that the patient will be medically stable for discharge from the hospital within 2 midnights of admission.   * I certify that at the point of admission it is my clinical judgment that the patient will require inpatient hospital care spanning beyond 2 midnights from the point of admission due to high intensity of service, high risk for further deterioration and high frequency of surveillance required.*  Author: Buena Irish, MD 03/11/2024 8:00 PM  For on call review [email or web address].

## 2024-03-11 NOTE — Telephone Encounter (Signed)
 Lab work resulted after clinic appointment concerning for low output heart failure with bicarb low, anion gap high, and AKI. Volume up and tachycardic in clinic. Called patient and recommended ED evaluation. Would recommend IV diuresis and performing lactic acidosis.

## 2024-03-11 NOTE — ED Notes (Signed)
 Patient transported to X-ray

## 2024-03-11 NOTE — ED Provider Triage Note (Signed)
 Emergency Medicine Provider Triage Evaluation Note  Austin [name] , a 68 y.o. male  was evaluated in triage.  Pt complains of abnormal labs.  Patient went to his cardiologist today and was referred here due to an abnormal BNP along with electrolytes.  Patient states he has not taken his Lasix in 4 days as his doctor took him off of it.  Wife states that he is getting over a viral illness in which he was vomiting a lot.  Patient denies any chest pain or shortness of breath..  Review of Systems  Positive:  Negative:   Physical Exam  BP (!) 120/92 (BP Location: Right Arm)   Pulse (!) 109   Temp 97.8 F (36.6 C) (Oral)   Resp 18   SpO2 100%  Gen:   Awake, no distress   Resp:  Normal effort  MSK:   Moves extremities without difficulty  Other:  No appreciable extremity edema  Medical Decision Making  Medically screening exam initiated at 1:52 PM.  Appropriate orders placed.  SAAHIL HERBSTER was informed that the remainder of the evaluation will be completed by another provider, this initial triage assessment does not replace that evaluation, and the importance of remaining in the ED until their evaluation is complete.  Workup initiated, outside labs do appear to have abnormal BNP along with electrolytes, do anticipate admission.   Netta Corrigan, PA-C 03/11/24 1353

## 2024-03-11 NOTE — Consult Note (Addendum)
 Advanced Heart Failure Team Consult Note   Primary Physician: [name], FNP Cardiologist:  Little Ishikawa, MD HF Cardiologist: Dr. Shirlee Latch  Reason for Consultation: Acute on chronic systolic CHF  HPI:    Austin [name] is a 68 y.o. male with history of ETOH abuse HTN, chronic systolic CHF. nonischemic cardiomyopathy.    HF dates back to 2021. Echo 3/21 EF < 20% with severe RV dysfunction.  RHC/LHC in 3/21 showed no CAD but elevated R > L filling pressures, preserved cardiac index.  Cardiac MRI 4/21: LVEF 14%, RVEF 16%, no LGE   EF had improved to 50-55% in 5/23.   Echo (8/24): EF 25-30%, GIDD, RV normal. Suspected drop in EF 2/2 med noncompliance and ETOH.    Echo (12/24): 25-30%   Seen by EP  1/25 CRT-D evaluation. EP felt that there might not be benefit of CRT-D. Did appear that LBBB is rate dependent, may better benefit from reduced rate.    He was readmitted 2/1-01/15/24 with fall resulting in left hip fracture s/p left hip arthroplasty. Course c/b anemia and hypotension. Workup c/w adrenal insufficiency. Discharged home on hydrocortisone. Entresto, Toprol XL and spironolactone were discontinued.   Seen in clinic today for follow-up. Feeling okay. Notes a few days of "stomach flu" associated with nausea and vomiting. His wife reports that he downplays symptoms. Wife recently had surgery. He missed medications while caring for her. He appeared volume up with ReDS 44%. After appointment he was contacted to go to the ED d/t concern for low-output HF given lab abnormalities (Cr 1.47 (baseline 1), Na 120, bicarb 14, anion gap 19, BNP 4,500). Sinus tachycardia on ECG.   FH: Sister with CHF, brother with pacemaker, mother with CHF.    SH: Married, lives in [place]. Heavy ETOH no drugs.  He was a Estate agent.     Home Medications Prior to Admission medications   Medication Sig Start Date End Date Taking? Authorizing Provider  docusate sodium (COLACE) 100 MG  capsule Take 1 capsule (100 mg total) by mouth 2 (two) times daily. 01/15/24   Regalado, Belkys A, MD  empagliflozin (JARDIANCE) 10 MG TABS tablet Take 1 tablet (10 mg total) by mouth daily before breakfast. 09/17/23   Laurey Morale, MD  ferrous sulfate 325 (65 FE) MG tablet Take 1 tablet (325 mg total) by mouth daily with breakfast. 01/16/24   Regalado, Belkys A, MD  furosemide (LASIX) 40 MG tablet Take 1.5 tablets (60 mg total) by mouth daily. 03/11/24   Lee, [place], NP  hydrocortisone (CORTEF) 10 MG tablet Take 1 tablet (10 mg total) by mouth daily. Take in the morning at 7 am. Patient taking differently: Patient takes 1 tablet in the morning and 0.5 tablet in the evening. 02/18/24   [name], FNP  loperamide (IMODIUM) 2 MG capsule Take 2 mg by mouth every 6 (six) hours as needed for diarrhea or loose stools. 10/28/23   [provider]  losartan (COZAAR) 25 MG tablet Take 1 tablet (25 mg total) by mouth daily. 02/13/24 05/13/24  Andrey Farmer, PA-C  metoprolol succinate (TOPROL XL) 25 MG 24 hr tablet Take 1 tablet (25 mg total) by mouth daily. 03/11/24   Lee, [place], NP  Multiple Vitamin (MULTIVITAMIN) tablet Take 1 tablet by mouth daily.    [provider]  senna (SENOKOT) 8.6 MG TABS tablet Take 1 tablet (8.6 mg total) by mouth 2 (two) times daily. 01/15/24   Regalado, Prentiss Bells, MD  spironolactone (  ALDACTONE) 25 MG tablet Take 1 tablet (25 mg total) by mouth daily. 03/11/24   Lee, [place], NP    Past Medical History: Past Medical History:  Diagnosis Date   Arthritis    hands   CHF (congestive heart failure) (HCC)    GERD (gastroesophageal reflux disease)    Hypertension    Hypomagnesemia    Hyponatremia    Prostate cancer [facility])     Past Surgical History: Past Surgical History:  Procedure Laterality Date   LYMPHADENECTOMY Bilateral 01/26/2014   Procedure: LYMPHADENECTOMY WITH INDOCYANINE GREEN DYE;  Surgeon: Sebastian Ache, MD;  Location: [facility];  Service: Urology;   Laterality: Bilateral;   RIGHT/LEFT HEART CATH AND CORONARY ANGIOGRAPHY [name]/A 02/25/2020   Procedure: RIGHT/LEFT HEART CATH AND CORONARY ANGIOGRAPHY;  Surgeon: Kathleene Hazel, MD;  [facility];  Service: Cardiovascular;  Laterality: [name]/A;   ROBOT ASSISTED LAPAROSCOPIC RADICAL PROSTATECTOMY [name]/A 01/26/2014   Procedure: ROBOTIC ASSISTED LAPAROSCOPIC RADICAL PROSTATECTOMY;  Surgeon: Sebastian Ache, MD;  Location: [facility];  Service: Urology;  Laterality: [name]/A;   SHOULDER SURGERY Left    TEE WITHOUT CARDIOVERSION [name]/A 04/15/2022   Procedure: TRANSESOPHAGEAL ECHOCARDIOGRAM (TEE);  Surgeon: Laurey Morale, MD;  Location: [facility] ENDOSCOPY;  Service: Cardiovascular;  Laterality: [name]/A;   TOTAL HIP ARTHROPLASTY Left 01/11/2024   Procedure: TOTAL HIP ARTHROPLASTY ANTERIOR APPROACH;  Surgeon: Samson Frederic, MD;  [facility]: [facility];  Service: Orthopedics;  Laterality: Left;    Family History: Family History  Problem Relation Age of Onset   Hypertension Mother    Hypertension Father     Social History: Social History   Socioeconomic History   Marital status: Married    Spouse name: Not on file   Number of children: 2   Years of education: Not on file   Highest education level: Bachelor's degree (e.g., BA, AB, BS)  Occupational History   Not on file  Tobacco Use   Smoking status: Never   Smokeless tobacco: Never  Vaping Use   Vaping status: Never Used  Substance and Sexual Activity   Alcohol use: Not Currently    Comment: 1-3 beers daily   Drug use: No   Sexual activity: Yes  Other Topics Concern   Not on file  Social History Narrative   Not on file   Social Drivers of Health   Financial Resource Strain: Low Risk  (02/11/2024)   Overall Financial Resource Strain (CARDIA)    Difficulty of Paying Living Expenses: Not hard at all  Food Insecurity: No Food Insecurity (02/11/2024)   Hunger Vital Sign    Worried About Running Out of Food in the Last Year: Never true    Ran Out of  Food in the Last Year: Never true  Transportation Needs: No Transportation Needs (02/11/2024)   PRAPARE - Administrator, Civil Service (Medical): No    Lack of Transportation (Non-Medical): No  Physical Activity: Insufficiently Active (02/11/2024)   Exercise Vital Sign    Days of Exercise per Week: 2 days    Minutes of Exercise per Session: 40 min  Stress: No Stress Concern Present (02/11/2024)   [employer] of Occupational Health - Occupational Stress Questionnaire    Feeling of Stress : Not at all  Social Connections: Socially Integrated (02/11/2024)   Social Connection and Isolation Panel [NHANES]    Frequency of Communication with Friends and Family: Never    Frequency of Social Gatherings with Friends and Family: More than three times a week  Attends Religious Services: More than 4 times per year    Active Member of Clubs or Organizations: Yes    Attends Banker Meetings: More than 4 times per year    Marital Status: Married    Allergies:  Allergies  Allergen Reactions   Latex Swelling and Rash    Objective:    Vital Signs:   Temp:  [97.8 F (36.6 C)] 97.8 F (36.6 C) (04/03 1326) Pulse Rate:  [94-109] 94 (04/03 1600) Resp:  [18] 18 (04/03 1326) BP: (120-130)/(92-93) 130/93 (04/03 1600) SpO2:  [100 %] 100 % (04/03 1600) Weight:  [70.8 kg] 70.8 kg (04/03 1403)    Weight change: Filed Weights   03/11/24 1403  Weight: 70.8 kg    Intake/Output:  No intake or output data in the 24 hours ending 03/11/24 1604    Physical Exam    General:  Fatigued appearing elderly male Neck: JVP to midneck.  Cor: Regular rate & rhythm. No rubs, gallops or murmurs. Lungs: diminished in bases Abdomen: soft, nontender, nondistended.  Extremities: trace edema, extremities are cool Neuro: alert & orientedx3. Affect pleasant  EKG    Sinus tach 108, LBBB with QRS 152 ms  Labs   Basic Metabolic Panel: Recent Labs  Lab 03/11/24 0955 03/11/24 1438   NA 120* 120*  K 4.5 4.0  CL 87* 85*  CO2 14* 17*  GLUCOSE 105* 107*  BUN 13 13  CREATININE 1.47* 1.50*  CALCIUM 8.7* 8.7*    Liver Function Tests: Recent Labs  Lab 03/11/24 1438  AST 132*  ALT 61*  ALKPHOS 110  BILITOT 2.6*  PROT 7.1  ALBUMIN 3.2*   No results for input(s): "LIPASE", "AMYLASE" in the last 168 hours. No results for input(s): "AMMONIA" in the last 168 hours.  CBC: Recent Labs  Lab 03/11/24 1438  WBC 10.1  NEUTROABS 8.2*  HGB 11.8*  HCT 33.6*  MCV 92.1  PLT 221    Cardiac Enzymes: No results for input(s): "CKTOTAL", "CKMB", "CKMBINDEX", "TROPONINI" in the last 168 hours.  BNP: BNP (last 3 results) Recent Labs    01/06/24 0837 02/13/24 1011 03/11/24 0955  BNP >4,500.0* 1,383.1* >4,500.0*    ProBNP (last 3 results) No results for input(s): "PROBNP" in the last 8760 hours.   CBG: No results for input(s): "GLUCAP" in the last 168 hours.  Coagulation Studies: No results for input(s): "LABPROT", "INR" in the last 72 hours.   Imaging   DG Chest 2 View Result Date: 03/11/2024 CLINICAL DATA:  Shortness of breath EXAM: CHEST - 2 VIEW COMPARISON:  Chest x-ray 05/26/2023 FINDINGS: The heart is enlarged. There are bands of atelectasis in both lung bases. There small bilateral pleural effusions. There is no pneumothorax or acute fracture. IMPRESSION: 1. Cardiomegaly with small bilateral pleural effusions. 2. Bands of atelectasis in both lung bases. Electronically Signed   By: Darliss Cheney M.D.   On: 03/11/2024 15:32     Medications:     Current Medications:   Infusions:     Patient Profile   68 y.o. male with history of chronic systolic CHF/NICM, LBBB, ETOH abuse, hyponatremia, possible adrenal insufficiency. Presenting with acute on chronic CHF with concern for low-output.  Assessment/Plan   1. Chronic systolic CHF: NICM, possibly due to ETOH.  Echo 3/21 EF 20%, moderately decreased RV.  RHC/LHC 3/21 no significant coronary  disease, preserved cardiac output, R>L heart failure.  Cardiac MRI in 4/21 showed EF 14%, severe RV dysfunction. No LGE but difficult LGE  images, cannot rule out prior myocarditis.  TEE in 5/23 showed EF 50-55%, normal RV, trivial MR, mobile RA structure seen on TTE was [facility]. Echo in 8/24 with drop in EF to 25-30% in setting of heavy ETOH intake and noncompliance with meds. Echo 12/24 EF 25-30% on GDMT. He has cut back some on ETOH but is still drinking.  - NYHA difficult, often downplays symptoms. Volume up. Start 80 mg lasix IV BID. BNP > 4500.  - Concerned for low-output HF with bicarb of 14 on labs today and GI symptoms. Check lactic acid. Ordered PICC for CVP and CO-OX monitoring. - No beta blocker with concern for low-output - Start losartan 25 mg daily - Encouraged to continue to cut back on ETOH use.   - Seen by Dr. Graciela Husbands for CRT-D. Not sure that with age whether ICD would be beneficial for mortality. It was thought that LBBB was more rate dependent and would benefit from better rate control. Beta blocker not currently a good choice for him.   2. LBBB:  - Repeat Echo 12/24: 25-30% after compliance with medications and cutting back on ETOH. - See above CRT-D discussion   3. ETOH abuse: Long history of heavy ETOH. This may be the cause of his cardiomyopathy.  Reports drinking 3-4 beers daily, recommended cessation.   4. Hyponatremia:  - Chronic. Typically upper 120s-130s, Na down to 120 today - ? Hypervolemic hyponatremia +/- ETOH   5. Adrenal insufficiency: Low am cortisol during recent admit. He was placed on hydrocortisone  6. AKI: - Scr 1.5, baseline 1 - ? Low output HF vs GI illness   7. [name]/V -Symptoms X 3 days -? Viral GI illness vs low-output HF -Has elevated T bili (chronic) and elevated LFTs which could be explained by CHF   Recommend admission under hospitalist service. Advanced Heart Failure will continue to follow.  Length of Stay: 0  Austin Tietje N,  PA-C  03/11/2024, 4:04 PM  Advanced Heart Failure Team Pager [phone] (M-F; 7a - 5p)  Please contact [facility] for night-coverage after hours (4p -7a ) and weekends on [email or web address]

## 2024-03-11 NOTE — Patient Instructions (Addendum)
 Good to see you today!  START Toprol 25 ng daily  INCREASE Spironolactone to 25 mg(1 tablet) daily  INCREASE Lasix to 60 mg (1.5 tablets) daily  Labs done today, your results will be available in MyChart, we will contact you for abnormal readings. Follow up lab work in 10 days as scheduled  Your physician recommends that you schedule a follow-up appointment :4 weeks with app clinic  If you have any questions or concerns before your next appointment please send [place] a message through [facility] or call our office at [phone].    TO LEAVE A MESSAGE FOR THE NURSE SELECT OPTION 2, PLEASE LEAVE A MESSAGE INCLUDING: YOUR NAME DATE OF BIRTH CALL BACK NUMBER REASON FOR CALL**this is important as we prioritize the call backs  YOU WILL RECEIVE A CALL BACK THE SAME DAY AS LONG AS YOU CALL BEFORE 4:00 PM At the [facility], you and your health needs are our priority. As part of our continuing mission to provide you with exceptional heart care, we have created designated Provider Care Teams. These Care Teams include your primary Cardiologist (physician) and Advanced Practice Providers (APPs- Physician Assistants and Nurse Practitioners) who all work together to provide you with the care you need, when you need it.   You may see any of the following providers on your designated Care Team at your next follow up: Dr Arvilla Meres Dr Marca Ancona Dr. Dorthula Nettles Dr. Clearnce Hasten Amy Filbert Schilder, NP Robbie Lis, [place] [facility] [place], [place] Brynda Peon, NP [place] Lee, NP Clarisa Kindred, NP Karle Plumber, PharmD Enos Fling, PharmD   Please be sure to bring in all your medications bottles to every appointment.    Thank you for choosing [facility]

## 2024-03-12 ENCOUNTER — Inpatient Hospital Stay (HOSPITAL_COMMUNITY)

## 2024-03-12 DIAGNOSIS — R570 Cardiogenic shock: Secondary | ICD-10-CM | POA: Diagnosis not present

## 2024-03-12 DIAGNOSIS — F1090 Alcohol use, unspecified, uncomplicated: Secondary | ICD-10-CM

## 2024-03-12 DIAGNOSIS — E2740 Unspecified adrenocortical insufficiency: Secondary | ICD-10-CM

## 2024-03-12 DIAGNOSIS — I10 Essential (primary) hypertension: Secondary | ICD-10-CM

## 2024-03-12 DIAGNOSIS — E871 Hypo-osmolality and hyponatremia: Secondary | ICD-10-CM | POA: Diagnosis not present

## 2024-03-12 DIAGNOSIS — I5023 Acute on chronic systolic (congestive) heart failure: Secondary | ICD-10-CM

## 2024-03-12 LAB — COMPREHENSIVE METABOLIC PANEL WITH GFR
ALT: 58 U/L — ABNORMAL HIGH (ref 0–44)
AST: 108 U/L — ABNORMAL HIGH (ref 15–41)
Albumin: 3.1 g/dL — ABNORMAL LOW (ref 3.5–5.0)
Alkaline Phosphatase: 97 U/L (ref 38–126)
Anion gap: 17 — ABNORMAL HIGH (ref 5–15)
BUN: 15 mg/dL (ref 8–23)
CO2: 20 mmol/L — ABNORMAL LOW (ref 22–32)
Calcium: 8.4 mg/dL — ABNORMAL LOW (ref 8.9–10.3)
Chloride: 85 mmol/L — ABNORMAL LOW (ref 98–111)
Creatinine, Ser: 1.3 mg/dL — ABNORMAL HIGH (ref 0.61–1.24)
GFR, Estimated: 60 mL/min — ABNORMAL LOW (ref >60)
Glucose, Bld: 88 mg/dL (ref 70–99)
Potassium: 4.1 mmol/L (ref 3.5–5.1)
Sodium: 122 mmol/L — ABNORMAL LOW (ref 135–145)
Total Bilirubin: 2.6 mg/dL — ABNORMAL HIGH (ref 0.0–1.2)
Total Protein: 6.7 g/dL (ref 6.5–8.1)

## 2024-03-12 LAB — LACTIC ACID, PLASMA: Lactic Acid, Venous: 1.9 mmol/L (ref 0.5–1.9)

## 2024-03-12 LAB — ECHOCARDIOGRAM COMPLETE
AR max vel: 1.77 cm2
AV Area VTI: 1.72 cm2
AV Area mean vel: 1.99 cm2
AV Mean grad: 4 mmHg
AV Peak grad: 6.7 mmHg
Ao pk vel: 1.29 m/s
Area-P 1/2: 5.16 cm2
Height: 69 in
S' Lateral: 4.8 cm
Weight: 2405.66 [oz_av]

## 2024-03-12 LAB — COOXEMETRY PANEL
Carboxyhemoglobin: 1.9 % — ABNORMAL HIGH (ref 0.5–1.5)
Methemoglobin: <0.7 % (ref 0.0–1.5)
O2 Saturation: 76.3 %
Total hemoglobin: 12.6 g/dL (ref 12.0–16.0)

## 2024-03-12 LAB — MAGNESIUM: Magnesium: 1.2 mg/dL — ABNORMAL LOW (ref 1.7–2.4)

## 2024-03-12 LAB — GLUCOSE, CAPILLARY: Glucose-Capillary: 99 mg/dL (ref 70–99)

## 2024-03-12 MED ORDER — MAGNESIUM SULFATE 4 GM/100ML IV SOLN
4.0000 g | Freq: Once | INTRAVENOUS | Status: AC
  Administered 2024-03-12: 4 g via INTRAVENOUS
  Filled 2024-03-12: qty 100

## 2024-03-12 MED ORDER — LORAZEPAM 1 MG PO TABS: 1.0000 mg | ORAL_TABLET | ORAL | Status: DC | PRN

## 2024-03-12 MED ORDER — DOCUSATE SODIUM 100 MG PO CAPS
100.0000 mg | ORAL_CAPSULE | Freq: Every day | ORAL | Status: DC
  Administered 2024-03-12 – 2024-03-16 (×5): 100 mg via ORAL
  Filled 2024-03-12 (×5): qty 1

## 2024-03-12 MED ORDER — EMPAGLIFLOZIN 10 MG PO TABS
10.0000 mg | ORAL_TABLET | Freq: Every day | ORAL | Status: DC
Start: 2024-03-12 — End: 2024-03-12
  Administered 2024-03-12: 10 mg via ORAL
  Filled 2024-03-12: qty 1

## 2024-03-12 MED ORDER — LOSARTAN POTASSIUM 25 MG PO TABS
25.0000 mg | ORAL_TABLET | Freq: Every day | ORAL | Status: DC
  Administered 2024-03-12 – 2024-03-16 (×5): 25 mg via ORAL
  Filled 2024-03-12 (×5): qty 1

## 2024-03-12 MED ORDER — DIGOXIN 125 MCG PO TABS
0.1250 mg | ORAL_TABLET | Freq: Every day | ORAL | Status: DC
  Administered 2024-03-12 – 2024-03-16 (×5): 0.125 mg via ORAL
  Filled 2024-03-12 (×5): qty 1

## 2024-03-12 MED ORDER — EMPAGLIFLOZIN 10 MG PO TABS
10.0000 mg | ORAL_TABLET | Freq: Every day | ORAL | Status: DC
  Administered 2024-03-12 – 2024-03-16 (×5): 10 mg via ORAL
  Filled 2024-03-12 (×5): qty 1

## 2024-03-12 MED ORDER — SODIUM CHLORIDE 0.9% FLUSH: 10.0000 mL | INTRAVENOUS | Status: DC | PRN

## 2024-03-12 MED ORDER — SODIUM CHLORIDE 0.9% FLUSH
10.0000 mL | Freq: Two times a day (BID) | INTRAVENOUS | Status: DC
  Administered 2024-03-12: 30 mL
  Administered 2024-03-12 – 2024-03-16 (×7): 10 mL

## 2024-03-12 MED ORDER — CHLORHEXIDINE GLUCONATE CLOTH 2 % EX PADS
6.0000 | MEDICATED_PAD | Freq: Every day | CUTANEOUS | Status: DC
  Administered 2024-03-12 – 2024-03-16 (×5): 6 via TOPICAL

## 2024-03-12 NOTE — Progress Notes (Signed)
 Peripherally Inserted Central Catheter Placement  The IV Nurse has discussed with the patient and/or persons authorized to consent for the patient, the purpose of this procedure and the potential benefits and risks involved with this procedure.  The benefits include less needle sticks, lab draws from the catheter, and the patient may be discharged home with the catheter. Risks include, but not limited to, infection, bleeding, blood clot (thrombus formation), and puncture of an artery; nerve damage and irregular heartbeat and possibility to perform a PICC exchange if needed/ordered by physician.  Alternatives to this procedure were also discussed.  Bard Power PICC patient education guide, fact sheet on infection prevention and patient information card has been provided to patient /or left at bedside.    PICC Placement Documentation  PICC Triple Lumen 03/12/24 Right Brachial 38 cm 0 cm (Active)  Indication for Insertion or Continuance of Line Vasoactive infusions 03/12/24 1036  Exposed Catheter (cm) 0 cm 03/12/24 1036  Site Assessment Clean, Dry, Intact 03/12/24 1036  Lumen #1 Status Flushed;Saline locked;Blood return noted 03/12/24 1036  Lumen #2 Status Flushed;Saline locked;Blood return noted 03/12/24 1036  Lumen #3 Status Flushed;Saline locked;Blood return noted 03/12/24 1036  Dressing Type Transparent;Securing device 03/12/24 1036  Dressing Status Antimicrobial disc/dressing in place 03/12/24 1036  Line Care Connections checked and tightened 03/12/24 1036  Line Adjustment (NICU/IV Team Only) No 03/12/24 1036  Dressing Intervention New dressing;Adhesive placed at insertion site (IV team only) 03/12/24 1036  Dressing Change Due 03/19/24 03/12/24 1036       Siegfried Vieth 03/12/2024, 10:38 AM

## 2024-03-12 NOTE — Assessment & Plan Note (Addendum)
 Echocardiogram with reduced LV systolic function with EF 20 to 25%, global hypokinesis, RV systolic function with moderate reduction, RV with moderate enlargement, RVSP 43.3 mmHg,   Urine output 4350  ml Systolic blood pressure 100 mmHg range  SV02 70.8   Continue inotropic support with milrinone 0.25 mcg/kg/min. Furosemide 80 mg IV bid  Digoxin  Continue losartan, SGLT 2 inh.   Positive hepatic congestion in the setting, follow up liver enzymes.   Telemetry with sinus rhythm with positive frequent PVCs

## 2024-03-12 NOTE — Assessment & Plan Note (Addendum)
 Will start to wean off hydrocortisone with close follow up on blood pressure and electrolytes.  Tolerating well reduced dose of hydrocortisone to  5 mg po bid.

## 2024-03-12 NOTE — Evaluation (Signed)
 Physical Therapy Brief Evaluation and Discharge Note Patient Details Name: Austin [name] MRN: [id number] DOB: [birth year] Today's Date: 03/12/2024   History of Present Illness  Pt is a 68 y.o. male admitted 03/11/24  with cardiogenic shock, AKI, and hyponatremia. 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 Of note, recent hospitalization 2/1-2/6 for L hip fx after falling at home s/p L THA.   Clinical Impression  Pt greeted supine in bed, pleasant and agreeable to PT evaluation. PTA, pt was modI with functional mobility using hurrycane, modI with ADLs/IADLs, and worked part-time at [employer] in the evenings. He resides with his wife in a one story house with 2 STE and BHR. Pt reports he has been participating in HHPT and plans to start OPPT next week. He completed all functional mobility with modI as PT managed lines/leads. No further acute PT needs. Recommend OOB<>chair 3x/day and walks 2x/day while admitted.      PT Assessment All further PT needs can be met in the next venue of care  Assistance Needed at Discharge  PRN    Equipment Recommendations None recommended by PT (Pt already has DME)  Recommendations for Other Services       Precautions/Restrictions Precautions Precautions: Fall Recall of Precautions/Restrictions: Intact Restrictions Weight Bearing Restrictions Per Provider Order: No        Mobility  Bed Mobility   Supine/Sidelying to sit: Modified independent (Device/Increased time) Sit to supine/sidelying: Modified independent (Device/Increased time) General bed mobility comments: HOB elevated. No physical assistance or VC for sequencing.  Transfers Overall transfer level: Modified independent Equipment used: Straight cane Transfers: Sit to/from Stand             General transfer comment: Pt stood from  lowest bed height without physical assistance to power up. PT managed lines/leads.    Ambulation/Gait Ambulation/Gait assistance: Modified independent (Device/Increase time) Gait Distance (Feet): 250 Feet Assistive device: Straight cane Gait Pattern/deviations: WFL(Within Functional Limits), Step-through pattern, Decreased step length - left, Decreased stance time - left Gait Speed: Pace WFL General Gait Details: Pt ambulated with a reciprocal gait pattern using hurrycane in RUE. He was able to navigate obstacles in the room/hallway well with good safety awareness observed. Mild gait abnormalities noted secondary to L THA. No overt LOB.  Home Activity Instructions Home Activity Instructions: Recommended daily mobility of walks in and out of the house. Encouraged pt to continue wtih HEP provided by HHPT.  Stairs Stairs: Yes Stairs assistance: Modified independent (Device/Increase time) Stair Management: With cane, One rail Left, Forwards, Step to pattern Number of Stairs: 3 General stair comments: Pt ascended leading with RLE and descended leading with LLE. He demonstrated proper technique using SPC in RUE on stairs. Pt turned on the 3rd step without LOB.  Modified Rankin (Stroke Patients Only)        Balance Overall balance assessment: Mild deficits observed, not formally tested                        Pertinent Vitals/Pain   Pain Assessment Pain Assessment: No/denies pain     Home Living Family/patient expects to be discharged to:: Private residence Living Arrangements: Spouse/significant other Available Help at Discharge: Family;Available 24 hours/day Home Environment: Stairs to enter  [employer] of Steps: 2 Home Equipment: Agricultural consultant (2 wheels);Rollator (4 wheels);Cane - single point;Grab bars - tub/shower  Prior Function Level of Independence: Independent with assistive device(s) Comments: Ambulates intermittently with hurrycane. Pt reports  forgetting the cane often. 1 fall in the last 75mo. ModI with ADLs. Pt reports sponge bathes using a bucket inside his tub. He is indepent with self care, meal prep, laundry, medications, and driving. Pt reports working part-time in the evenings at the [employer].    UE/LE Assessment   UE ROM/Strength/Tone/Coordination: WFL    LE ROM/Strength/Tone/Coordination: WFL (Pt s/p L THA.)      Communication   Communication Communication: No apparent difficulties     Cognition Overall Cognitive Status: Appears within functional limits for tasks assessed/performed       General Comments General comments (skin integrity, edema, etc.): VSS on RA.    Exercises     Assessment/Plan    PT Problem List Decreased balance;Decreased mobility       PT Visit Diagnosis Difficulty in walking, not elsewhere classified (R26.2);Other abnormalities of gait and mobility (R26.89);Unsteadiness on feet (R26.81)    No Skilled PT     Co-evaluation                AMPAC 6 Clicks Help needed turning from your back to your side while in a flat bed without using bedrails?: None Help needed moving from lying on your back to sitting on the side of a flat bed without using bedrails?: None Help needed moving to and from a bed to a chair (including a wheelchair)?: None Help needed standing up from a chair using your arms (e.g., wheelchair or bedside chair)?: None Help needed to walk in hospital room?: None Help needed climbing 3-5 steps with a railing? : None 6 Click Score: 24      End of Session Equipment Utilized During Treatment: Gait belt Activity Tolerance: Patient tolerated treatment well Patient left: in bed;with call bell/phone within reach;with family/visitor present Nurse Communication: Mobility status PT Visit Diagnosis: Difficulty in walking, not elsewhere classified (R26.2);Other abnormalities of gait and mobility (R26.89);Unsteadiness on feet (R26.81)     Time: 4098-1191 PT Time  Calculation (min) (ACUTE ONLY): 23 min  Charges:   PT Evaluation $PT Eval Low Complexity: 1 Low      Cheri Guppy, PT, DPT Acute Rehabilitation Services Office: [phone] Secure Chat Preferred  Richardson Chiquito  03/12/2024, 3:49 PM

## 2024-03-12 NOTE — Progress Notes (Addendum)
 Advanced Heart Failure Rounding Note  Cardiologist: Little Ishikawa, MD   Chief Complaint: Cardiogenic shock  Subjective:    Lactic acid 4.7>>repeat pending.  On 0.25 milrinone.   Reports good diuresis with 80 mg IV lasix. Is/Os not completes. Reports UOP dumped on multiple occurrences.  Nausea and vomiting improved.   Objective:   Weight Range: 68.2 kg Body mass index is 22.2 kg/m.   Vital Signs:   Temp:  [97.4 F (36.3 C)-97.8 F (36.6 C)] 97.7 F (36.5 C) (04/04 0425) Pulse Rate:  [88-109] 103 (04/03 2300) Resp:  [18-20] 18 (04/04 0425) BP: (119-130)/(87-97) 119/89 (04/04 0427) SpO2:  [100 %] 100 % (04/03 1630) Weight:  [68.2 kg-70.8 kg] 68.2 kg (04/04 0425) Last BM Date : 03/11/24  Weight change: Filed Weights   03/11/24 1951 03/12/24 0300 03/12/24 0425  Weight: 69 kg 68.2 kg 68.2 kg    Intake/Output:   Intake/Output Summary (Last 24 hours) at 03/12/2024 0701 Last data filed at 03/12/2024 0224 Gross per 24 hour  Intake 250 ml  Output 526 ml  Net -276 ml      Physical Exam    General:  Chronically ill appearing Neck: JVP to midneck Cor: Regular rate & rhythm. No rubs, gallops or murmurs. Lungs: Clear Abdomen: Soft, nontender, nondistended.  Extremities: Trace edema. Extremities are warm Neuro: Alert & orientedx3. Affect pleasant   Telemetry   SR 90s, PVCs  Labs    CBC Recent Labs    03/11/24 1438  WBC 10.1  NEUTROABS 8.2*  HGB 11.8*  HCT 33.6*  MCV 92.1  PLT 221   Basic Metabolic Panel Recent Labs    32/44/01 1438 03/12/24 0317  NA 120* 122*  K 4.0 4.1  CL 85* 85*  CO2 17* 20*  GLUCOSE 107* 88  BUN 13 15  CREATININE 1.50* 1.30*  CALCIUM 8.7* 8.4*  MG  --  1.2*   Liver Function Tests Recent Labs    03/11/24 1438 03/12/24 0317  AST 132* 108*  ALT 61* 58*  ALKPHOS 110 97  BILITOT 2.6* 2.6*  PROT 7.1 6.7  ALBUMIN 3.2* 3.1*   Recent Labs    03/11/24 1446  LIPASE 24   Cardiac Enzymes No results for  input(s): "CKTOTAL", "CKMB", "CKMBINDEX", "TROPONINI" in the last 72 hours.  BNP: BNP (last 3 results) Recent Labs    02/13/24 1011 03/11/24 0955 03/11/24 1437  BNP 1,383.1* >4,500.0* >4,500.0*    ProBNP (last 3 results) No results for input(s): "PROBNP" in the last 8760 hours.   D-Dimer No results for input(s): "DDIMER" in the last 72 hours. Hemoglobin A1C No results for input(s): "HGBA1C" in the last 72 hours. Fasting Lipid Panel No results for input(s): "CHOL", "HDL", "LDLCALC", "TRIG", "CHOLHDL", "LDLDIRECT" in the last 72 hours. Thyroid Function Tests No results for input(s): "TSH", "T4TOTAL", "T3FREE", "THYROIDAB" in the last 72 hours.  Invalid input(s): "FREET3"  Other results:   Imaging    [place] EKG SITE RITE Result Date: 03/11/2024 If Site Rite image not attached, placement could not be confirmed due to current cardiac rhythm.  [place] EKG SITE RITE Result Date: 03/11/2024 If Site Rite image not attached, placement could not be confirmed due to current cardiac rhythm.  DG Chest 2 View Result Date: 03/11/2024 CLINICAL DATA:  Shortness of breath EXAM: CHEST - 2 VIEW COMPARISON:  Chest x-ray 05/26/2023 FINDINGS: The heart is enlarged. There are bands of atelectasis in both lung bases. There small bilateral pleural effusions. There is no  pneumothorax or acute fracture. IMPRESSION: 1. Cardiomegaly with small bilateral pleural effusions. 2. Bands of atelectasis in both lung bases. Electronically Signed   By: Darliss Cheney M.D.   On: 03/11/2024 15:32     Medications:     Scheduled Medications:  docusate sodium  100 mg Oral Daily   empagliflozin  10 mg Oral QAC breakfast   folic acid  1 mg Oral Daily   furosemide  80 mg Intravenous BID   heparin  5,000 Units Subcutaneous Q8H   hydrocortisone  5-10 mg Oral BID   influenza vaccine adjuvanted  0.5 mL Intramuscular Tomorrow-1000   multivitamin with minerals  1 tablet Oral Daily   pneumococcal 20-valent conjugate vaccine  0.5  mL Intramuscular Tomorrow-1000   sodium chloride flush  3 mL Intravenous Q12H   thiamine  100 mg Oral Daily   Or   thiamine  100 mg Intravenous Daily    Infusions:  sodium chloride     milrinone 0.25 mcg/kg/min (03/11/24 2054)    PRN Medications: sodium chloride, acetaminophen, bisacodyl, LORazepam **OR** LORazepam, senna, sodium chloride flush    Patient Profile   68 y.o. male with history of chronic systolic CHF/NICM, LBBB, ETOH abuse, hyponatremia, possible adrenal insufficiency. Presenting with cardiogenic shock, AKI and hyponatremia.  Assessment/Plan   1. Chronic systolic CHF>>cardiogenic shock: NICM, possibly due to ETOH.  Echo 3/21 EF 20%, moderately decreased RV.  RHC/LHC 3/21 no significant coronary disease, preserved cardiac output, R>L heart failure.  Cardiac MRI in 4/21 showed EF 14%, severe RV dysfunction. No LGE but difficult LGE images, cannot rule out prior myocarditis.  TEE in 5/23 showed EF 50-55%, normal RV, trivial MR, mobile RA structure seen on TTE was [facility]. Echo in 8/24 with drop in EF to 25-30% in setting of heavy ETOH intake and noncompliance with meds. Echo 12/24 EF 25-30% on GDMT. He has cut back some on ETOH but is still drinking.  - Lactic acid 4.7>>recheck - Continue milrinone 0.25. Awaiting PICC placement. - Continue IV lasix 80 BID - No beta blocker with shock - Start losartan 25 mg daily - Start digoxin 0.125 mg daily - Restart jardiance tomorrow - Encouraged to continue to cut back on ETOH use.   - Repeat echo today - Seen by Dr. Graciela Husbands for CRT-D. Not sure that with age whether ICD would be beneficial for mortality. It was thought that LBBB was more rate dependent and would benefit from better rate control. Beta blocker not currently a good choice for him. - Would likely not be a candidate for VAD with RV dysfunction. Not transplant candidate d/t ETOH abuse.   2. LBBB:  - Repeat Echo 12/24: 25-30% after compliance with medications and  cutting back on ETOH. - See above CRT-D discussion   3. ETOH abuse: Long history of heavy ETOH. This may be the cause of his cardiomyopathy.  Reports drinking 3-4 beers daily, recommended cessation.   4. Hyponatremia:  - Chronic. Typically upper 120s-130s, Na down to 120 this admit - Likely d/t liver disease +/- CHF and ETOH. - Watch closely - Evidence of cirrhosis noted on [place] in 2021.    5. Adrenal insufficiency: Low am cortisol during recent admit. He was placed on hydrocortisone   6. AKI: - In setting of cardiogenic shock - Improving, Scr 1.3 today   7. N/V -Likely d/t cardiogenic shock.  -Improving with inotrope support   Length of Stay: 1  Khadijatou Borak N, PA-C  03/12/2024, 7:01 AM  Advanced Heart  Failure Team Pager [phone] (M-F; 7a - 5p)  Please contact [facility] for night-coverage after hours (5p -7a ) and weekends on [email or web address]

## 2024-03-12 NOTE — Assessment & Plan Note (Addendum)
 No current signs of acute alcohol withdrawal.  Patient was placed on as needed oral lorazepam with good toleration.

## 2024-03-12 NOTE — Progress Notes (Signed)
 Heart Failure Navigator Progress Note  Assessed for Heart & Vascular TOC clinic readiness.  Patient does not meet criteria due to Advanced Heart Failure Team patient of Dr. Shirlee Latch,. .   Navigator will sign off at this time.   Rhae Hammock, BSN, Scientist, clinical (histocompatibility and immunogenetics) Only

## 2024-03-12 NOTE — Progress Notes (Signed)
 Progress Note   Patient: Austin [name]:[id number] DOB: [birth year] DOA: 03/11/2024     1 DOS: the patient was seen and examined on 03/12/2024   Brief hospital course: [name] was admitted to the hospital with the working diagnosis of acute on chronic heart failure exacerbation.   68 yo male with the past medical history of heart failure, alcohol abuse, who presented with dyspnea.  Recent hospitalization 02/01 to 01/15/24 for left hip fracture, with post op hypotension in the setting of adrenal insufficiency.  04/03 outpatient follow up with heart failure clinic, he was found hypervolemic and blood work was obtained. Serum sodium was found to be low and he was advised to come to the ED.  On his initial physical examination his blood pressure was 130/93, HR 98, RR 18 and 02 saturation 100%.  Lungs with no wheezing or rhonchi, positive rales, heart with S1 and S2 present and regular with no gallops or rubs, abdomen with no distention and no lower extremity edema.   Na 120, K 4,0 CL 87, bicarbonate 14, glucose 105 bun 13 cr 1,47  BNP > 4,500  Wbc 10,1 hgb 11,8 plt 221   Chest radiograph with hypoinflation, right base atelectasis, bilateral hilar vascular congestion with bilateral pleural effusions, positive cardiomegaly.   EKG 110, left axis deviation, qtc 538, left bundle branch block, with sinus rhythm with no significant ST segment or T wave changes. Positive LVH.   Assessment and Plan: * Acute on chronic systolic CHF (congestive heart failure) (HCC) Echocardiogram with reduced LV systolic function with EF 20 to 25%, global hypokinesis, RV systolic function with moderate reduction, RV with moderate enlargement, RVSP 43.3 mmHg,   Urine output 2250 ml Systolic blood pressure 120 mmHg.   Plan to continue inotropic support with milrinone 0.25 mcg/kg/min. Furosemide 80 mg IV bid  Digoxin  Continue losartan, SGLT 2 inh.   Positive hepatic congestion in the setting, follow up liver  enzymes.   Essential hypertension Continue blood pressure monitoring Continue losartan.   Hyponatremia Renal function with serum cr at 1,30 with K at 4,1 bicarbonate 20  Na 122 ad Mg 1.2   Plan to continue diuresis with IV furosemide Add 4 g mag sulfate.  Follow up renal function and electrolytes in am.   Alcohol use disorder No current signs of acute alcohol withdrawal.  Will dc CIWA protocol.  Continue with as needed oral lorazepam as needed.   Adrenal insufficiency (HCC) Continue with hydrocortisone.       Subjective: Patient is feeling better, dyspnea has improved, but not back to baseline. No chest pain, no PND   Physical Exam: Vitals:   03/12/24 0427 03/12/24 0716 03/12/24 1100 03/12/24 1253  BP: 119/89 (!) 122/90    Pulse:  94  89  Resp:  18 20   Temp:  98.1 F (36.7 C) (!) 93.4 F (34.1 C)   TempSrc:  Oral Oral   SpO2:  98%    Weight:      Height:       Neurology awake and alert ENT with mild pallor Cardiovascular with S1 and S2 present and regular with no gallops, rubs or murmurs No JVD Respiratory with no rales or wheezing, no rhonchi Abdomen with no distention  Trace lower extremity edema   Data Reviewed:    Family Communication: I spoke with patient's wife at the bedside, we talked in detail about patient's condition, plan of care and prognosis and all questions were addressed.   Disposition: Status  is: Inpatient Remains inpatient appropriate because: IV inotropic   Planned Discharge Destination: Home     Author: Coralie Keens, MD 03/12/2024 4:30 PM  For on call review [email or web address].

## 2024-03-12 NOTE — TOC Progression Note (Signed)
 Transition of Care [facility]) - Progression Note    Patient Details  Name: Austin [name] MRN: [id number] Date of Birth: Nov 03, 1956  [facility] CM/SW Contact  Nicanor Bake Phone Number: [phone] 03/12/2024, 12:43 PM  Clinical Narrative:   12:32 PM- HF CSW attempted to meet with patient at bedside. Patient was beingseen by the nurse. CSW will follow up at amore appropriate time.   TOC will continue following.     Expected Discharge Plan: Home/Self Care Barriers to Discharge: Continued Medical Work up  Expected Discharge Plan and Services   Discharge Planning Services: CM Consult   Living arrangements for the past 2 months: Single Family Home                                       Social Determinants of Health (SDOH) Interventions SDOH Screenings   Food Insecurity: No Food Insecurity (03/11/2024)  Housing: Low Risk  (03/11/2024)  Transportation Needs: No Transportation Needs (03/11/2024)  Utilities: Not At Risk (03/11/2024)  Alcohol Screen: Low Risk  (02/11/2024)  Depression (PHQ2-9): Low Risk  (02/11/2024)  Financial Resource Strain: Low Risk  (02/11/2024)  Physical Activity: Insufficiently Active (02/11/2024)  Social Connections: Socially Integrated (03/11/2024)  Stress: No Stress Concern Present (02/11/2024)  Tobacco Use: Low Risk  (03/11/2024)  Health Literacy: Adequate Health Literacy (02/11/2024)    Readmission Risk Interventions    01/15/2024   12:10 PM 01/12/2024    1:06 PM  Readmission Risk Prevention Plan  Transportation Screening Complete Complete  PCP or Specialist Appt within 5-7 Days  Complete  Home Care Screening  Complete  Medication Review (RN CM)  Complete

## 2024-03-12 NOTE — Evaluation (Signed)
 Occupational Therapy Evaluation and Discharge Patient Details Name: Austin [name] MRN: [id number] DOB: 06-Mar-1956 Today's Date: 03/12/2024   History of Present Illness   68 yo male admitted on 03/11/24 from CHF clinic with cardiogenic shock, AKI and hyponatremia.  PMH:  L hip fx s/p THA, ETOH abuse, chronic hyponatremia, HTN, non ischemic cardiomyopathy, prostate cancer.     Clinical Impressions Pt has been participating in HHPT and had progressed to walking with a cane. He has returned to driving and is independent in self care, meal prep and laundry. Plans to start OPPT next week. Pt is likely very near his baseline. Supervised ambulation in his room with cane due to multiple lines. He requires set up to supervision for ADLs.  No further OT needs.      If plan is discharge home, recommend the following:   Assistance with cooking/housework     Functional Status Assessment   Patient has not had a recent decline in their functional status     Equipment Recommendations   None recommended by OT     Recommendations for Other Services         Precautions/Restrictions   Precautions Precautions: Fall Restrictions Weight Bearing Restrictions Per Provider Order: No     Mobility Bed Mobility Overal bed mobility: Modified Independent             General bed mobility comments: HOB up    Transfers Overall transfer level: Needs assistance Equipment used: Straight cane Transfers: Sit to/from Stand Sit to Stand: Supervision           General transfer comment: supervised for lines      Balance Overall balance assessment: Needs assistance   Sitting balance-Leahy Scale: Good     Standing balance support: Single extremity supported Standing balance-Leahy Scale: Fair                             ADL either performed or assessed with clinical judgement   ADL Overall ADL's : At baseline                                        General ADL Comments: supervised for lines     Vision Baseline Vision/History: 0 No visual deficits Ability to See in Adequate Light: 0 Adequate       Perception         Praxis         Pertinent Vitals/Pain Pain Assessment Pain Assessment: No/denies pain     Extremity/Trunk Assessment Upper Extremity Assessment Upper Extremity Assessment: Overall WFL for tasks assessed   Lower Extremity Assessment Lower Extremity Assessment: Defer to PT evaluation   Cervical / Trunk Assessment Cervical / Trunk Assessment: Normal   Communication Communication Communication: No apparent difficulties   Cognition Arousal: Alert Behavior During Therapy: WFL for tasks assessed/performed Cognition: No apparent impairments                               Following commands: Intact       Cueing  General Comments   Cueing Techniques: Verbal cues      Exercises     Shoulder Instructions      Home Living Family/patient expects to be discharged to:: Private residence Living Arrangements: Spouse/significant other Available Help at Discharge: Family;Available 24 hours/day  Type of Home: House Home Access: Stairs to enter [employer] of Steps: 1   Home Layout: One level     Bathroom Shower/Tub: Tub/shower unit;Walk-in shower   Bathroom Toilet: Handicapped height     Home Equipment: Agricultural consultant (2 wheels);Rollator (4 wheels);Cane - single point;Grab bars - tub/shower          Prior Functioning/Environment Prior Level of Function : Independent/Modified Independent;Driving             Mobility Comments: walks with a cane ADLs Comments: sponge bathes, independent    OT Problem List: Impaired balance (sitting and/or standing)   OT Treatment/Interventions:        OT Goals(Current goals can be found in the care plan section)       OT Frequency:       Co-evaluation              AM-PAC OT "6 Clicks" Daily Activity     Outcome  Measure Help from another person eating meals?: None Help from another person taking care of personal grooming?: A Little Help from another person toileting, which includes using toliet, bedpan, or urinal?: A Little Help from another person bathing (including washing, rinsing, drying)?: A Little Help from another person to put on and taking off regular upper body clothing?: None Help from another person to put on and taking off regular lower body clothing?: A Little 6 Click Score: 20   End of Session Equipment Utilized During Treatment: Gait belt;Other (comment) (cane)  Activity Tolerance: Patient tolerated treatment well Patient left: in bed;with call bell/phone within reach  OT Visit Diagnosis: Unsteadiness on feet (R26.81)                Time: 7829-5621 OT Time Calculation (min): 23 min Charges:  OT General Charges $OT Visit: 1 Visit OT Evaluation $OT Eval Low Complexity: 1 Low OT Treatments $Self Care/Home Management : 8-22 mins  Berna Spare, OTR/L Acute Rehabilitation Services Office: [phone]  Evern Bio 03/12/2024, 12:03 PM

## 2024-03-12 NOTE — Plan of Care (Signed)
   Problem: Education: Goal: Knowledge of General Education information will improve Description: Including pain rating scale, medication(s)/side effects and non-pharmacologic comfort measures Outcome: Progressing   Problem: Activity: Goal: Risk for activity intolerance will decrease Outcome: Progressing   Problem: Elimination: Goal: Will not experience complications related to bowel motility Outcome: Progressing

## 2024-03-12 NOTE — Assessment & Plan Note (Addendum)
 Continue losartan.

## 2024-03-12 NOTE — Assessment & Plan Note (Addendum)
 Volume status is improving.   Renal function today with serum cr at 1,23 with K at 3,4 and serum bicarbonate at 29  Na 128 and Mg 2.2   Continue K correction with Kcl 80 meq in 2 divided doses.  Diuresis with furosemide, spironolactone and SGLT 2 inh  Follow up renal function renal function in am.  Avoid hypotension and nephrotoxic agents.

## 2024-03-12 NOTE — Hospital Course (Addendum)
 Mr. Austin [name] was admitted to the hospital with the working diagnosis of acute on chronic heart failure exacerbation.   68 yo male with the past medical history of heart failure, alcohol abuse, who presented with dyspnea.  Recent hospitalization 02/01 to 01/15/24 for left hip fracture, with post op hypotension in the setting of adrenal insufficiency.  04/03 outpatient follow up with heart failure clinic, he was found hypervolemic and blood work was obtained. Serum sodium was found to be low and he was advised to come to the ED.  On his initial physical examination his blood pressure was 130/93, HR 98, RR 18 and 02 saturation 100%.  Lungs with no wheezing or rhonchi, positive rales, heart with S1 and S2 present and regular with no gallops or rubs, abdomen with no distention and no lower extremity edema.   Na 120, K 4,0 CL 87, bicarbonate 14, glucose 105 bun 13 cr 1,47  BNP > 4,500  Wbc 10,1 hgb 11,8 plt 221   Chest radiograph with hypoinflation, right base atelectasis, bilateral hilar vascular congestion with bilateral pleural effusions, positive cardiomegaly.   EKG 110, left axis deviation, qtc 538, left bundle branch block, with sinus rhythm with no significant ST segment or T wave changes. Positive LVH.   Patient was placed on furosemide IV and milrinone   04/05 volume status is improving.  04/06 improving hemodynamics, continue on inotropic therapy.  04/08 off milrinone.

## 2024-03-12 NOTE — Progress Notes (Signed)
 PT Cancellation Note  Patient Details Name: Austin [name] MRN: [id number] DOB: [birth year]   Cancelled Treatment:    Reason Eval/Treat Not Completed: Patient at procedure or test/unavailable (PT consult appreciated and chart reviewed. Attempted to see pt at 9:16 AM Echo present in room. Attempted again at 9:46 AM with sign on door stating "sterile procedure in process." RN reported they are putting in a PICC line.) Will follow-up for PT evaluation as schedule permits.   Cheri Guppy, PT, DPT Acute Rehabilitation Services Office: [phone] Secure Chat Preferred  Richardson Chiquito 03/12/2024, 9:50 AM

## 2024-03-12 NOTE — TOC Progression Note (Signed)
 Transition of Care [facility]) - Progression Note    Patient Details  Name: ZACCHARY POMPEI MRN: [id number] Date of Birth: [birth year]  [facility] of [facility]) CM/SW Contact  Michaela Corner, [place] Phone Number: 03/12/2024, 11:47 AM  Clinical Narrative:   CSW completed Ethanol consult and provided patient with resources.   TOC will continue to follow.      Expected Discharge Plan: Home/Self Care Barriers to Discharge: Continued Medical Work up  Expected Discharge Plan and Services   Discharge Planning Services: CM Consult   Living arrangements for the past 2 months: Single Family Home                                       Social Determinants of Health (SDOH) Interventions SDOH Screenings   Food Insecurity: No Food Insecurity (03/11/2024)  Housing: Low Risk  (03/11/2024)  Transportation Needs: No Transportation Needs (03/11/2024)  Utilities: Not At Risk (03/11/2024)  Alcohol Screen: Low Risk  (02/11/2024)  Depression (PHQ2-9): Low Risk  (02/11/2024)  Financial Resource Strain: Low Risk  (02/11/2024)  Physical Activity: Insufficiently Active (02/11/2024)  Social Connections: Socially Integrated (03/11/2024)  Stress: No Stress Concern Present (02/11/2024)  Tobacco Use: Low Risk  (03/11/2024)  Health Literacy: Adequate Health Literacy (02/11/2024)    Readmission Risk Interventions    01/15/2024   12:10 PM 01/12/2024    1:06 PM  Readmission Risk Prevention Plan  Transportation Screening Complete Complete  PCP or Specialist Appt within 5-7 Days  Complete  Home Care Screening  Complete  Medication Review (RN CM)  Complete

## 2024-03-13 DIAGNOSIS — E871 Hypo-osmolality and hyponatremia: Secondary | ICD-10-CM | POA: Diagnosis not present

## 2024-03-13 DIAGNOSIS — F1090 Alcohol use, unspecified, uncomplicated: Secondary | ICD-10-CM | POA: Diagnosis not present

## 2024-03-13 DIAGNOSIS — I5023 Acute on chronic systolic (congestive) heart failure: Secondary | ICD-10-CM | POA: Diagnosis not present

## 2024-03-13 DIAGNOSIS — I10 Essential (primary) hypertension: Secondary | ICD-10-CM | POA: Diagnosis not present

## 2024-03-13 LAB — COOXEMETRY PANEL
Carboxyhemoglobin: 1.9 % — ABNORMAL HIGH (ref 0.5–1.5)
Methemoglobin: <0.7 % (ref 0.0–1.5)
O2 Saturation: 70.8 %
Total hemoglobin: 12.3 g/dL (ref 12.0–16.0)

## 2024-03-13 LAB — BASIC METABOLIC PANEL WITH GFR
Anion gap: 15 (ref 5–15)
BUN: 16 mg/dL (ref 8–23)
CO2: 27 mmol/L (ref 22–32)
Calcium: 8.5 mg/dL — ABNORMAL LOW (ref 8.9–10.3)
Chloride: 84 mmol/L — ABNORMAL LOW (ref 98–111)
Creatinine, Ser: 1.22 mg/dL (ref 0.61–1.24)
GFR, Estimated: >60 mL/min (ref >60)
Glucose, Bld: 106 mg/dL — ABNORMAL HIGH (ref 70–99)
Potassium: 2.8 mmol/L — ABNORMAL LOW (ref 3.5–5.1)
Sodium: 126 mmol/L — ABNORMAL LOW (ref 135–145)

## 2024-03-13 LAB — MAGNESIUM: Magnesium: 2.7 mg/dL — ABNORMAL HIGH (ref 1.7–2.4)

## 2024-03-13 MED ORDER — POTASSIUM CHLORIDE 10 MEQ/100ML IV SOLN
10.0000 meq | INTRAVENOUS | Status: AC
  Administered 2024-03-13 (×2): 10 meq via INTRAVENOUS
  Filled 2024-03-13 (×2): qty 100

## 2024-03-13 MED ORDER — HYDROCORTISONE 5 MG PO TABS
5.0000 mg | ORAL_TABLET | Freq: Two times a day (BID) | ORAL | Status: DC
  Administered 2024-03-13 – 2024-03-16 (×6): 5 mg via ORAL
  Filled 2024-03-13 (×7): qty 1

## 2024-03-13 MED ORDER — POTASSIUM CHLORIDE CRYS ER 20 MEQ PO TBCR
40.0000 meq | EXTENDED_RELEASE_TABLET | Freq: Once | ORAL | Status: AC
  Administered 2024-03-13: 40 meq via ORAL
  Filled 2024-03-13: qty 2

## 2024-03-13 NOTE — Progress Notes (Addendum)
 Progress Note   Patient: Austin [name] AOZ:[id number] DOB: [birth year] DOA: 03/11/2024     2 DOS: the patient was seen and examined on 03/13/2024   Brief hospital course: [name] was admitted to the hospital with the working diagnosis of acute on chronic heart failure exacerbation.   68 yo male with the past medical history of heart failure, alcohol abuse, who presented with dyspnea.  Recent hospitalization 02/01 to 01/15/24 for left hip fracture, with post op hypotension in the setting of adrenal insufficiency.  04/03 outpatient follow up with heart failure clinic, he was found hypervolemic and blood work was obtained. Serum sodium was found to be low and he was advised to come to the ED.  On his initial physical examination his blood pressure was 130/93, HR 98, RR 18 and 02 saturation 100%.  Lungs with no wheezing or rhonchi, positive rales, heart with S1 and S2 present and regular with no gallops or rubs, abdomen with no distention and no lower extremity edema.   Na 120, K 4,0 CL 87, bicarbonate 14, glucose 105 bun 13 cr 1,47  BNP > 4,500  Wbc 10,1 hgb 11,8 plt 221   Chest radiograph with hypoinflation, right base atelectasis, bilateral hilar vascular congestion with bilateral pleural effusions, positive cardiomegaly.   EKG 110, left axis deviation, qtc 538, left bundle branch block, with sinus rhythm with no significant ST segment or T wave changes. Positive LVH.   Patient was placed on furosemide IV and milrinone   04/05 volume status is improving.   Assessment and Plan: * Acute on chronic systolic CHF (congestive heart failure) (HCC) Echocardiogram with reduced LV systolic function with EF 20 to 25%, global hypokinesis, RV systolic function with moderate reduction, RV with moderate enlargement, RVSP 43.3 mmHg,   Urine output 4350  ml Systolic blood pressure 100 mmHg range  SV02 70.8   Continue inotropic support with milrinone 0.25 mcg/kg/min. Furosemide 80 mg IV bid   Digoxin  Continue losartan, SGLT 2 inh.   Positive hepatic congestion in the setting, follow up liver enzymes.   Telemetry with sinus rhythm with positive frequent PVCs  Essential hypertension Continue blood pressure monitoring Continue losartan.   Hyponatremia Volume status is improving.   Renal function today with serum cr at 1,2 with K at 2,8 and serum bicarbonate at 27  Na 126 and Mg 2.7   Continue K correction with Kcl total of 100 meq in 3 divided doses.  Follow up renal function renal function in am.  Avoid hypotension and nephrotoxic agents.   Alcohol use disorder No current signs of acute alcohol withdrawal.  Continue with as needed oral lorazepam as needed.   Adrenal insufficiency (HCC) Will start to wean off hydrocortisone with close follow up on blood pressure and electrolytes.  Reduce dose to 5 mg po bid.       Subjective: Patient with no chest pain or dyspnea, he has been ambulating   Physical Exam: Vitals:   03/13/24 0400 03/13/24 0752 03/13/24 1046 03/13/24 1208  BP: 96/73 98/67  111/86  Pulse:  83 79 100  Resp: 18 18  20   Temp: (!) 97.3 F (36.3 C) 97.6 F (36.4 C)  97.7 F (36.5 C)  TempSrc: Oral Oral  Oral  SpO2:  96%  97%  Weight: 63.4 kg     Height:       Neurology awake and alert ENT with no pallor Cardiovascular with S1 and S2 present and regular with no gallops, rubs or  murmurs No JVD  No lower extremity edema Respiratory with no rales or wheezing, no rhonchi  Abdomen with no distention  Data Reviewed:    Family Communication: I spoke with patient's wife at the bedside, we talked in detail about patient's condition, plan of care and prognosis and all questions were addressed.   Disposition: Status is: Inpatient Remains inpatient appropriate because: IV milrinone   Planned Discharge Destination: Home     Author: Coralie Keens, MD 03/13/2024 1:00 PM  For on call review [email or web address].

## 2024-03-13 NOTE — Progress Notes (Signed)
   Patient Name: Austin [name] Date of Encounter: 03/13/2024 [facility] Cardiologist: Little Ishikawa, MD   Interval Summary  .    When asked how are you feeling, he says how am I doing.  Breathing has improved.  Not at baseline.  Vital Signs .    Vitals:   03/12/24 1917 03/13/24 0014 03/13/24 0400 03/13/24 0752  BP: 100/74 109/77 96/73 98/67   Pulse: 87   83  Resp: 18  18 18   Temp: (!) 97.3 F (36.3 C) (!) 97.3 F (36.3 C) (!) 97.3 F (36.3 C) 97.6 F (36.4 C)  TempSrc: Oral Oral Oral Oral  SpO2:    96%  Weight:   63.4 kg   Height:        Intake/Output Summary (Last 24 hours) at 03/13/2024 0804 Last data filed at 03/13/2024 0208 Gross per 24 hour  Intake 701.81 ml  Output 4225 ml  Net -3523.19 ml      03/13/2024    4:00 AM 03/12/2024    4:25 AM 03/12/2024    3:00 AM  Last 3 Weights  Weight (lbs) 139 lb 11.2 oz 150 lb 5.7 oz 150 lb 5.7 oz  Weight (kg) 63.368 kg 68.2 kg 68.2 kg      Telemetry/ECG    Sinus rhythm- Personally Reviewed  Physical Exam .   GEN: No acute distress.   Neck: Moderate neck JVD Cardiac: RRR, no murmurs, rubs, or gallops.  Respiratory: Clear to auscultation bilaterally. GI: Soft, nontender, non-distended  MS: No edema  Assessment & Plan .     68 year old with acute on chronic systolic heart failure, biventricular heart failure EF 20%, nonischemic cardiomyopathy, alcohol use  Chronic systolic heart failure/cardiogenic shock/nonischemic cardiomyopathy possibly secondary to alcohol - EF 20%, lactic acid was 4.7, on repeat 1.9 improved -Serum sodium 126, potassium 2.8-replacing, creatinine 1.2 -Excellent diuresis yesterday 4.3 L, BNP greater than 4500 -Currently on milrinone 0.25 mcg, Jardiance 10 mg, digoxin 0.125 daily furosemide 80 mg IV twice daily losartan 25 mg a day  Left bundle branch block - Previous CRT-D discussion.  Dr. Graciela Husbands.  Cirrhosis - Noted on prior ultrasound 2021.  Alcohol.  AKI - Improved.   Originally in the setting of cardiogenic shock  Hyponatremia - Mixture of heart failure, alcohol use, liver disease   For questions or updates, please contact [facility] Please consult [email or web address] for contact info under        Signed, Donato Schultz, MD

## 2024-03-13 NOTE — Plan of Care (Signed)
  Problem: Health Behavior/Discharge Planning: Goal: Ability to manage health-related needs will improve Outcome: Progressing   Problem: Clinical Measurements: Goal: Will remain free from infection Outcome: Progressing Goal: Respiratory complications will improve Outcome: Progressing Goal: Cardiovascular complication will be avoided Outcome: Progressing   Problem: Activity: Goal: Risk for activity intolerance will decrease Outcome: Progressing   Problem: Coping: Goal: Level of anxiety will decrease Outcome: Progressing   Problem: Elimination: Goal: Will not experience complications related to bowel motility Outcome: Progressing Goal: Will not experience complications related to urinary retention Outcome: Progressing   Problem: Safety: Goal: Ability to remain free from injury will improve Outcome: Progressing   Problem: Skin Integrity: Goal: Risk for impaired skin integrity will decrease Outcome: Progressing

## 2024-03-13 NOTE — Plan of Care (Signed)
   Problem: Education: Goal: Knowledge of General Education information will improve Description Including pain rating scale, medication(s)/side effects and non-pharmacologic comfort measures Outcome: Progressing

## 2024-03-14 DIAGNOSIS — I10 Essential (primary) hypertension: Secondary | ICD-10-CM | POA: Diagnosis not present

## 2024-03-14 DIAGNOSIS — F1090 Alcohol use, unspecified, uncomplicated: Secondary | ICD-10-CM | POA: Diagnosis not present

## 2024-03-14 DIAGNOSIS — I5023 Acute on chronic systolic (congestive) heart failure: Secondary | ICD-10-CM | POA: Diagnosis not present

## 2024-03-14 DIAGNOSIS — E871 Hypo-osmolality and hyponatremia: Secondary | ICD-10-CM | POA: Diagnosis not present

## 2024-03-14 LAB — BASIC METABOLIC PANEL WITH GFR
Anion gap: 12 (ref 5–15)
BUN: 17 mg/dL (ref 8–23)
CO2: 29 mmol/L (ref 22–32)
Calcium: 8.8 mg/dL — ABNORMAL LOW (ref 8.9–10.3)
Chloride: 87 mmol/L — ABNORMAL LOW (ref 98–111)
Creatinine, Ser: 1.23 mg/dL (ref 0.61–1.24)
GFR, Estimated: >60 mL/min (ref >60)
Glucose, Bld: 115 mg/dL — ABNORMAL HIGH (ref 70–99)
Potassium: 3.4 mmol/L — ABNORMAL LOW (ref 3.5–5.1)
Sodium: 128 mmol/L — ABNORMAL LOW (ref 135–145)

## 2024-03-14 LAB — COOXEMETRY PANEL
Carboxyhemoglobin: 2.4 % — ABNORMAL HIGH (ref 0.5–1.5)
Methemoglobin: <0.7 % (ref 0.0–1.5)
O2 Saturation: 67.5 %
Total hemoglobin: 12.9 g/dL (ref 12.0–16.0)

## 2024-03-14 LAB — HEPATIC FUNCTION PANEL
ALT: 72 U/L — ABNORMAL HIGH (ref 0–44)
AST: 95 U/L — ABNORMAL HIGH (ref 15–41)
Albumin: 3.2 g/dL — ABNORMAL LOW (ref 3.5–5.0)
Alkaline Phosphatase: 105 U/L (ref 38–126)
Bilirubin, Direct: 0.4 mg/dL — ABNORMAL HIGH (ref 0.0–0.2)
Indirect Bilirubin: 0.7 mg/dL (ref 0.3–0.9)
Total Bilirubin: 1.1 mg/dL (ref 0.0–1.2)
Total Protein: 7.1 g/dL (ref 6.5–8.1)

## 2024-03-14 LAB — MAGNESIUM: Magnesium: 2.2 mg/dL (ref 1.7–2.4)

## 2024-03-14 MED ORDER — MILRINONE LACTATE IN DEXTROSE 20-5 MG/100ML-% IV SOLN
0.1250 ug/kg/min | INTRAVENOUS | Status: DC
  Administered 2024-03-14 – 2024-03-15 (×2): 0.125 ug/kg/min via INTRAVENOUS
  Filled 2024-03-14: qty 100

## 2024-03-14 MED ORDER — POTASSIUM CHLORIDE CRYS ER 20 MEQ PO TBCR
40.0000 meq | EXTENDED_RELEASE_TABLET | Freq: Once | ORAL | Status: AC
  Administered 2024-03-14: 40 meq via ORAL
  Filled 2024-03-14: qty 2

## 2024-03-14 NOTE — Plan of Care (Signed)
   Problem: Education: Goal: Knowledge of General Education information will improve Description Including pain rating scale, medication(s)/side effects and non-pharmacologic comfort measures Outcome: Progressing

## 2024-03-14 NOTE — Progress Notes (Signed)
   Patient Name: Austin [name] Date of Encounter: 03/14/2024 [facility] Cardiologist: Little Ishikawa, MD   Interval Summary  .    When asked how are you feeling, he says how am I doing.  Continues to improve, no chest pain.  Blood pressure fairly soft this morning 90/72  Vital Signs .    Vitals:   03/13/24 1701 03/13/24 2359 03/14/24 0448 03/14/24 0500  BP: 103/71 94/68 90/72  90/72  Pulse: 95  80   Resp: 18 18 18    Temp: 97.6 F (36.4 C) (!) 97.3 F (36.3 C) 97.6 F (36.4 C)   TempSrc: Oral Oral Oral   SpO2: 98%  99%   Weight:   61.5 kg   Height:   5\' 9"  (1.753 m)     Intake/Output Summary (Last 24 hours) at 03/14/2024 0812 Last data filed at 03/14/2024 0558 Gross per 24 hour  Intake 802.19 ml  Output 2610 ml  Net -1807.81 ml      03/14/2024    4:48 AM 03/13/2024    4:00 AM 03/12/2024    4:25 AM  Last 3 Weights  Weight (lbs) 135 lb 9.3 oz 139 lb 11.2 oz 150 lb 5.7 oz  Weight (kg) 61.5 kg 63.368 kg 68.2 kg      Telemetry/ECG    Sinus rhythm- Personally Reviewed  Physical Exam .   GEN: No acute distress.   Neck: Moderate neck JVD Cardiac: RRR, no murmurs, rubs, or gallops.  Respiratory: Clear to auscultation bilaterally.  Normal respiratory effort GI: Soft, nontender, non-distended  MS: No significant edema  Assessment & Plan .     68 year old with acute on chronic systolic heart failure, biventricular heart failure EF 20%, nonischemic cardiomyopathy, alcohol use  Chronic systolic heart failure/cardiogenic shock/nonischemic cardiomyopathy possibly secondary to alcohol - EF 20%, lactic acid was 4.7, on repeat 1.9 improved -Serum sodium 126, potassium 2.8 yesterday, currently 3.4 after replacement-replacing again, creatinine 1.2, stable on milrinone.  We will go ahead and try to wean off milrinone.  Pulling back to 125 mcg. -Excellent diuresis total -5.7 L, BNP greater than 4500 -Currently on milrinone 0.25 mcg, Jardiance 10 mg, digoxin 0.125  daily furosemide 80 mg IV twice daily losartan 25 mg a day  Left bundle branch block - Previous CRT-D discussion.  Dr. Graciela Husbands.  Since it was intermittent left bundle branch block not felt to be optimal candidate.  Also social issues.  Cirrhosis - Noted on prior ultrasound 2021.  Alcohol.  AKI - Improved.  Originally in the setting of cardiogenic shock  Hyponatremia - Mixture of heart failure, alcohol use, liver disease, 128 today from 122 2 days ago.  Improving with diuresis.   For questions or updates, please contact [facility] Please consult [email or web address] for contact info under        Signed, Donato Schultz, MD

## 2024-03-14 NOTE — Progress Notes (Signed)
 Progress Note   Patient: Austin [name] ZOX:[id number] DOB: 10-Mar-1956 DOA: 03/11/2024     3 DOS: the patient was seen and examined on 03/14/2024   Brief hospital course: Mr. Forbush was admitted to the hospital with the working diagnosis of acute on chronic heart failure exacerbation.   68 yo male with the past medical history of heart failure, alcohol abuse, who presented with dyspnea.  Recent hospitalization 02/01 to 01/15/24 for left hip fracture, with post op hypotension in the setting of adrenal insufficiency.  04/03 outpatient follow up with heart failure clinic, he was found hypervolemic and blood work was obtained. Serum sodium was found to be low and he was advised to come to the ED.  On his initial physical examination his blood pressure was 130/93, HR 98, RR 18 and 02 saturation 100%.  Lungs with no wheezing or rhonchi, positive rales, heart with S1 and S2 present and regular with no gallops or rubs, abdomen with no distention and no lower extremity edema.   Na 120, K 4,0 CL 87, bicarbonate 14, glucose 105 bun 13 cr 1,47  BNP > 4,500  Wbc 10,1 hgb 11,8 plt 221   Chest radiograph with hypoinflation, right base atelectasis, bilateral hilar vascular congestion with bilateral pleural effusions, positive cardiomegaly.   EKG 110, left axis deviation, qtc 538, left bundle branch block, with sinus rhythm with no significant ST segment or T wave changes. Positive LVH.   Patient was placed on furosemide IV and milrinone   04/05 volume status is improving.  04/06 improving hemodynamics, continue on inotropic therapy.   Assessment and Plan: * Acute on chronic systolic CHF (congestive heart failure) (HCC) Echocardiogram with reduced LV systolic function with EF 20 to 25%, global hypokinesis, RV systolic function with moderate reduction, RV with moderate enlargement, RVSP 43.3 mmHg,   Urine output 2,610  ml Systolic blood pressure 98 mmHg range  SV02 67.5   Decreased milrinone to  0.125 mcg/kg/min. Furosemide 80 mg IV bid  Digoxin  Continue losartan, SGLT 2 inh.   Positive hepatic congestion in the setting, liver enzymes are trending down with AST 95 and ALT 72    Telemetry with sinus rhythm with positive frequent PVCs  Essential hypertension Continue blood pressure monitoring Continue losartan.   Hyponatremia Volume status is improving.   Renal function today with serum cr at 1,23 with K at 3,4 and serum bicarbonate at 29  Na 128 and Mg 2.2   Continue K correction with Kcl 80 meq in 2 divided doses.  Diuresis with furosemide, spironolactone and SGLT 2 inh  Follow up renal function renal function in am.  Avoid hypotension and nephrotoxic agents.   Alcohol use disorder No current signs of acute alcohol withdrawal.  Continue with as needed oral lorazepam as needed.   Adrenal insufficiency (HCC) Will start to wean off hydrocortisone with close follow up on blood pressure and electrolytes.  Tolerating well reduced dose of hydrocortisone to  5 mg po bid.       Subjective: Patient is feeling better, no dyspnea, PND or orthopnea, no lower extremity edema.   Physical Exam: Vitals:   03/13/24 1701 03/13/24 2359 03/14/24 0448 03/14/24 0500  BP: 103/71 94/68 90/72  90/72  Pulse: 95  80   Resp: 18 18 18    Temp: 97.6 F (36.4 C) (!) 97.3 F (36.3 C) 97.6 F (36.4 C)   TempSrc: Oral Oral Oral   SpO2: 98%  99%   Weight:   61.5 kg  Height:   5\' 9"  (1.753 m)    Neurology awake and alert ENT with no pallor Cardiovascular with S1 and S2 present and regular with no gallops or rubs, positive systolic murmur at the apex Respiratory with no rales or wheezing, no rhonchi  Abdomen with no distention  No lower extremity edema  Data Reviewed:    Family Communication: no family at the bedside   Disposition: Status is: Inpatient Remains inpatient appropriate because: IV milrinone   Planned Discharge Destination: Home      Author: Coralie Keens, MD 03/14/2024 11:16 AM  For on call review [email or web address].

## 2024-03-15 DIAGNOSIS — N179 Acute kidney failure, unspecified: Secondary | ICD-10-CM | POA: Diagnosis not present

## 2024-03-15 DIAGNOSIS — I5023 Acute on chronic systolic (congestive) heart failure: Secondary | ICD-10-CM | POA: Diagnosis not present

## 2024-03-15 DIAGNOSIS — I10 Essential (primary) hypertension: Secondary | ICD-10-CM | POA: Diagnosis not present

## 2024-03-15 DIAGNOSIS — F1090 Alcohol use, unspecified, uncomplicated: Secondary | ICD-10-CM | POA: Diagnosis not present

## 2024-03-15 LAB — COOXEMETRY PANEL
Carboxyhemoglobin: 1.3 % (ref 0.5–1.5)
Carboxyhemoglobin: 1.7 % — ABNORMAL HIGH (ref 0.5–1.5)
Methemoglobin: <0.7 % (ref 0.0–1.5)
Methemoglobin: <0.7 % (ref 0.0–1.5)
O2 Saturation: 58.4 %
O2 Saturation: 69.9 %
Total hemoglobin: 13.6 g/dL (ref 12.0–16.0)
Total hemoglobin: 12.9 g/dL (ref 12.0–16.0)

## 2024-03-15 LAB — BASIC METABOLIC PANEL WITH GFR
Anion gap: 11 (ref 5–15)
BUN: 23 mg/dL (ref 8–23)
CO2: 28 mmol/L (ref 22–32)
Calcium: 8.9 mg/dL (ref 8.9–10.3)
Chloride: 87 mmol/L — ABNORMAL LOW (ref 98–111)
Creatinine, Ser: 1.3 mg/dL — ABNORMAL HIGH (ref 0.61–1.24)
GFR, Estimated: 60 mL/min — ABNORMAL LOW (ref >60)
Glucose, Bld: 137 mg/dL — ABNORMAL HIGH (ref 70–99)
Potassium: 3.7 mmol/L (ref 3.5–5.1)
Sodium: 126 mmol/L — ABNORMAL LOW (ref 135–145)

## 2024-03-15 LAB — MAGNESIUM: Magnesium: 1.7 mg/dL (ref 1.7–2.4)

## 2024-03-15 MED ORDER — POTASSIUM CHLORIDE CRYS ER 20 MEQ PO TBCR
20.0000 meq | EXTENDED_RELEASE_TABLET | Freq: Once | ORAL | Status: AC
  Administered 2024-03-15: 20 meq via ORAL
  Filled 2024-03-15: qty 1

## 2024-03-15 MED ORDER — MAGNESIUM SULFATE 2 GM/50ML IV SOLN
2.0000 g | Freq: Once | INTRAVENOUS | Status: AC
  Administered 2024-03-15: 2 g via INTRAVENOUS
  Filled 2024-03-15: qty 50

## 2024-03-15 NOTE — Progress Notes (Signed)
 Mobility Specialist Progress Note:   03/15/24 1000  Mobility  Activity Ambulated with assistance in hallway  Level of Assistance Standby assist, set-up cues, supervision of patient - no hands on  Assistive Device Cane  Distance Ambulated (ft) 250 ft  Activity Response Tolerated well  Mobility Referral Yes  Mobility visit 1 Mobility  Mobility Specialist Start Time (ACUTE ONLY) 1000  Mobility Specialist Stop Time (ACUTE ONLY) 1010  Mobility Specialist Time Calculation (min) (ACUTE ONLY) 10 min    Received pt in bed having no complaints and agreeable to mobility. Pt was asymptomatic throughout ambulation with cane and returned to room w/o fault. Left in bed w/ call bell in reach and all needs met.  Addison Lank Mobility Specialist Please contact via SecureChat or  Rehab office at [phone]

## 2024-03-15 NOTE — Progress Notes (Signed)
 Progress Note   Patient: Austin [name] UJW:[id number] DOB: 1956-06-12 DOA: 03/11/2024     4 DOS: the patient was seen and examined on 03/15/2024   Brief hospital course: Mr. Austin [name] was admitted to the hospital with the working diagnosis of acute on chronic heart failure exacerbation.   68 yo male with the past medical history of heart failure, alcohol abuse, who presented with dyspnea.  Recent hospitalization 02/01 to 01/15/24 for left hip fracture, with post op hypotension in the setting of adrenal insufficiency.  04/03 outpatient follow up with heart failure clinic, he was found hypervolemic and blood work was obtained. Serum sodium was found to be low and he was advised to come to the ED.  On his initial physical examination his blood pressure was 130/93, HR 98, RR 18 and 02 saturation 100%.  Lungs with no wheezing or rhonchi, positive rales, heart with S1 and S2 present and regular with no gallops or rubs, abdomen with no distention and no lower extremity edema.   Na 120, K 4,0 CL 87, bicarbonate 14, glucose 105 bun 13 cr 1,47  BNP > 4,500  Wbc 10,1 hgb 11,8 plt 221   Chest radiograph with hypoinflation, right base atelectasis, bilateral hilar vascular congestion with bilateral pleural effusions, positive cardiomegaly.   EKG 110, left axis deviation, qtc 538, left bundle branch block, with sinus rhythm with no significant ST segment or T wave changes. Positive LVH.   Patient was placed on furosemide IV and milrinone   04/05 volume status is improving.  04/06 improving hemodynamics, continue on inotropic therapy.  04/08 off milrinone.   Assessment and Plan: * Acute on chronic systolic CHF (congestive heart failure) (HCC) Echocardiogram with reduced LV systolic function with EF 20 to 25%, global hypokinesis, RV systolic function with moderate reduction, RV with moderate enlargement, RVSP 43.3 mmHg,   Urine output 1,900  ml Systolic blood pressure 100 mmHg range  SV02  69.9  Stopped milrinone today.  Holding on loop diuretic for now.  Continue with Digoxin, losartan, and SGLT 2 inh.   Positive hepatic congestion in the setting, liver enzymes are trending down with AST 95 and ALT 72    Telemetry with sinus rhythm with positive frequent PVCs  Essential hypertension Continue blood pressure monitoring Continue losartan.   AKI (acute kidney injury) (HCC) Hyponatremia.   Renal function with serum cr at 1,30 with K at 3,7 and serum bicarbonate at 23  Na 126 and Mg 1.7   Add 40 meq Kcl and 2 g mag sulfate.  Diuresis with spironolactone and SGLT 2 inh  Holding on loop diuretic for now.  Follow up renal function renal function in am.  Avoid hypotension and nephrotoxic agents.   Alcohol use disorder No current signs of acute alcohol withdrawal.  Continue with as needed oral lorazepam as needed.   Adrenal insufficiency (HCC) Will start to wean off hydrocortisone with close follow up on blood pressure and electrolytes.  Tolerating well reduced dose of hydrocortisone to  5 mg po bid.       Subjective: patient is feeling well, no chest pain, no dyspnea, no PND, lower extremity edema or orthopnea   Physical Exam: Vitals:   03/15/24 0800 03/15/24 0845 03/15/24 0849 03/15/24 1154  BP: (!) 122/50   99/68  Pulse: 91 88 88 70  Resp: 18   18  Temp: 97.9 F (36.6 C)   97.8 F (36.6 C)  TempSrc: Oral   Oral  SpO2: 100%  100% 100%  Weight:      Height:       Neurology awake and alert ENT with no pallor Cardiovascular with S1 and S2 present and regular with no gallops or rubs, no murmur  No JVD No lower extremity edema Respiratory with no rales or wheezing, no rhonchi Abdomen with no distention   Data Reviewed:    Family Communication: I spoke with patient's wife at the bedside, we talked in detail about patient's condition, plan of care and prognosis and all questions were addressed.   Disposition: Status is: Inpatient Remains inpatient  appropriate because: plan for discharge tomorrow, 24 hrs after stopping milrinone   Planned Discharge Destination: Home     Author: Coralie Keens, MD 03/15/2024 3:19 PM  For on call review [email or web address].

## 2024-03-15 NOTE — TOC Progression Note (Signed)
 Transition of Care [facility]) - Progression Note    Patient Details  Name: Austin [name] MRN: [id number] Date of Birth: January 03, 1956  [facility]) CM/SW Contact  Nicanor Bake Phone Number: [phone] 03/15/2024, 1:43 PM  Clinical Narrative:   HF CSW called and scheduled patients Hospital follow up appointment for Wedesday, March 17, 2024 at 8:20 AM.   Patient stated that his sister will be providing transportation home at dc.  TOC will continue following.      Expected Discharge Plan: Home/Self Care Barriers to Discharge: Continued Medical Work up  Expected Discharge Plan and Services   Discharge Planning Services: CM Consult   Living arrangements for the past 2 months: Single Family Home                                       Social Determinants of Health (SDOH) Interventions SDOH Screenings   Food Insecurity: No Food Insecurity (03/11/2024)  Housing: Low Risk  (03/11/2024)  Transportation Needs: No Transportation Needs (03/11/2024)  Utilities: Not At Risk (03/11/2024)  Alcohol Screen: Low Risk  (02/11/2024)  Depression (PHQ2-9): Low Risk  (02/11/2024)  Financial Resource Strain: Low Risk  (02/11/2024)  Physical Activity: Insufficiently Active (02/11/2024)  Social Connections: Socially Integrated (03/11/2024)  Stress: No Stress Concern Present (02/11/2024)  Tobacco Use: Low Risk  (03/11/2024)  Health Literacy: Adequate Health Literacy (02/11/2024)    Readmission Risk Interventions    01/15/2024   12:10 PM 01/12/2024    1:06 PM  Readmission Risk Prevention Plan  Transportation Screening Complete Complete  PCP or Specialist Appt within 5-7 Days  Complete  Home Care Screening  Complete  Medication Review (RN CM)  Complete

## 2024-03-15 NOTE — Care Management Important Message (Signed)
 Important Message  Patient Details  Name: Austin [name] MRN: [id number] Date of Birth: 1956/03/26   Important Message Given:  Yes - Medicare IM     Renie Ora 03/15/2024, 11:23 AM

## 2024-03-15 NOTE — Progress Notes (Signed)
 Advanced Heart Failure Rounding Note  Cardiologist: Little Ishikawa, MD   Chief Complaint: Cardiogenic shock  Subjective:    Lactic acid 4.7> now cleared  Milrinone cut back to 0.125 yesterday. Co-ox marginal today at 58%.   Diuresing with 80 IV lasix BID. -1.9L UOP. Weight down 20 lbs over the weekend.   CVP 2  Feels fine this morning. Asking about going home.   Objective:   Weight Range: 60.9 kg Body mass index is 19.83 kg/m.   Vital Signs:   Temp:  [97.7 F (36.5 C)-98.1 F (36.7 C)] 97.9 F (36.6 C) (04/07 0800) Pulse Rate:  [76-91] 91 (04/07 0800) Resp:  [18] 18 (04/07 0800) BP: (95-130)/(50-79) 122/50 (04/07 0800) SpO2:  [98 %-100 %] 100 % (04/07 0800) Weight:  [60.9 kg] 60.9 kg (04/07 0424) Last BM Date : 03/12/24  Weight change: Filed Weights   03/13/24 0400 03/14/24 0448 03/15/24 0424  Weight: 63.4 kg 61.5 kg 60.9 kg    Intake/Output:   Intake/Output Summary (Last 24 hours) at 03/15/2024 0839 Last data filed at 03/15/2024 0807 Gross per 24 hour  Intake 800.93 ml  Output 1900 ml  Net -1099.07 ml    Physical Exam  General:  well appearing.  No respiratory difficulty Neck: supple. JVD flat.  Cor: PMI nondisplaced. Regular rate & rhythm. No rubs, gallops or murmurs. Lungs: clear Extremities: no cyanosis, clubbing, rash, edema  Neuro: alert & oriented x 3. Moves all 4 extremities w/o difficulty. Affect pleasant.   Telemetry   NSR 80s. 8 beat NSVT (Personally reviewed)    Labs    CBC No results for input(s): "WBC", "NEUTROABS", "HGB", "HCT", "MCV", "PLT" in the last 72 hours.  Basic Metabolic Panel Recent Labs    16/10/96 0505 03/15/24 0507  NA 128* 126*  K 3.4* 3.7  CL 87* 87*  CO2 29 28  GLUCOSE 115* 137*  BUN 17 23  CREATININE 1.23 1.30*  CALCIUM 8.8* 8.9  MG 2.2 1.7   Liver Function Tests Recent Labs    03/14/24 0505  AST 95*  ALT 72*  ALKPHOS 105  BILITOT 1.1  PROT 7.1  ALBUMIN 3.2*   No results for  input(s): "LIPASE", "AMYLASE" in the last 72 hours.  Cardiac Enzymes No results for input(s): "CKTOTAL", "CKMB", "CKMBINDEX", "TROPONINI" in the last 72 hours.  BNP: BNP (last 3 results) Recent Labs    02/13/24 1011 03/11/24 0955 03/11/24 1437  BNP 1,383.1* >4,500.0* >4,500.0*    ProBNP (last 3 results) No results for input(s): "PROBNP" in the last 8760 hours.   D-Dimer No results for input(s): "DDIMER" in the last 72 hours. Hemoglobin A1C No results for input(s): "HGBA1C" in the last 72 hours. Fasting Lipid Panel No results for input(s): "CHOL", "HDL", "LDLCALC", "TRIG", "CHOLHDL", "LDLDIRECT" in the last 72 hours. Thyroid Function Tests No results for input(s): "TSH", "T4TOTAL", "T3FREE", "THYROIDAB" in the last 72 hours.  Invalid input(s): "FREET3"  Other results:   Imaging   No results found.   Medications:   Scheduled Medications:  Chlorhexidine Gluconate Cloth  6 each Topical Daily   digoxin  0.125 mg Oral Daily   docusate sodium  100 mg Oral Daily   empagliflozin  10 mg Oral Daily   folic acid  1 mg Oral Daily   furosemide  80 mg Intravenous BID   heparin  5,000 Units Subcutaneous Q8H   hydrocortisone  5 mg Oral BID   losartan  25 mg Oral Daily   multivitamin  with minerals  1 tablet Oral Daily   pneumococcal 20-valent conjugate vaccine  0.5 mL Intramuscular Tomorrow-1000   potassium chloride  20 mEq Oral Once   sodium chloride flush  10-40 mL Intracatheter Q12H   sodium chloride flush  3 mL Intravenous Q12H   thiamine  100 mg Oral Daily    Infusions:  magnesium sulfate bolus IVPB     milrinone 0.125 mcg/kg/min (03/15/24 0137)    PRN Medications: acetaminophen, bisacodyl, LORazepam, senna, sodium chloride flush, sodium chloride flush Patient Profile   68 y.o. male with history of chronic systolic CHF/NICM, LBBB, ETOH abuse, hyponatremia, possible adrenal insufficiency. Presenting with cardiogenic shock, AKI and hyponatremia.  Assessment/Plan    1. Acute on chronic systolic CHF>>cardiogenic shock: NICM, possibly due to ETOH.  Echo 3/21 EF 20%, moderately decreased RV.  RHC/LHC 3/21 no significant coronary disease, preserved cardiac output, R>L heart failure.  Cardiac MRI in 4/21 showed EF 14%, severe RV dysfunction. No LGE but difficult LGE images, cannot rule out prior myocarditis.  TEE in 5/23 showed EF 50-55%, normal RV, trivial MR, mobile RA structure seen on TTE was [facility]. Echo in 8/24 with drop in EF to 25-30% in setting of heavy ETOH intake and noncompliance with meds. Echo 12/24 EF 25-30% on GDMT. Echo 4/25 EF 20-25%, LV with GHK, RV mod reduced. He has cut back some on ETOH but is still drinking.  - Lactic acid 4.7>> now cleared - Continue milrinone 0.125. Co-ox marginal at 58%. Recheck co-ox. If higher can stop milrinone today.  - CVP low with bump in SCr. Will hold further lasix. Plan for PO diuretic tomorrow. (60 lasix at home) - No beta blocker with shock - Continue losartan 25 mg daily - Continue digoxin 0.125 mg daily - Continue jardiance 10 mg daily - Encouraged to continue to cut back on ETOH use.   - Seen by Dr. Graciela Husbands for CRT-D. Not sure that with age whether ICD would be beneficial for mortality. It was thought that LBBB was more rate dependent and would benefit from better rate control. Beta blocker not currently a good choice for him. - Would likely not be a candidate for VAD with RV dysfunction. Not transplant candidate d/t ETOH abuse.   2. LBBB:  - Repeat Echo 12/24: 25-30% after compliance with medications and cutting back on ETOH. - See above CRT-D discussion   3. ETOH abuse: Long history of heavy ETOH. This may be the cause of his cardiomyopathy.  Reports drinking 3-4 beers daily, recommended cessation.   4. Hyponatremia:  - Chronic. Typically upper 120s-130s, Na down to 120 this admit - Likely d/t liver disease +/- CHF and ETOH. - Watch closely - Evidence of cirrhosis noted on [place] in 2021.    5.  Adrenal insufficiency: Low am cortisol during recent admit. He was placed on hydrocortisone   6. AKI: - In setting of cardiogenic shock - SCr 1.23>1.3 today - Holding further diuretics.    7. N/V -Likely d/t cardiogenic shock.  -Improving with inotrope support  Repeat co-ox. May be able to wean off milrinone today with possible discharge tomorrow.   Length of Stay: 4  Alen Bleacher, NP  03/15/2024, 8:39 AM  Advanced Heart Failure Team Pager [phone] (M-F; 7a - 5p)  Please contact [facility] for night-coverage after hours (5p -7a ) and weekends on [email or web address]

## 2024-03-16 ENCOUNTER — Other Ambulatory Visit (HOSPITAL_COMMUNITY): Payer: Self-pay

## 2024-03-16 ENCOUNTER — Telehealth (HOSPITAL_COMMUNITY): Payer: Self-pay | Admitting: Emergency Medicine

## 2024-03-16 DIAGNOSIS — I10 Essential (primary) hypertension: Secondary | ICD-10-CM | POA: Diagnosis not present

## 2024-03-16 DIAGNOSIS — N179 Acute kidney failure, unspecified: Secondary | ICD-10-CM | POA: Diagnosis not present

## 2024-03-16 DIAGNOSIS — F1090 Alcohol use, unspecified, uncomplicated: Secondary | ICD-10-CM | POA: Diagnosis not present

## 2024-03-16 DIAGNOSIS — I5023 Acute on chronic systolic (congestive) heart failure: Secondary | ICD-10-CM | POA: Diagnosis not present

## 2024-03-16 LAB — BASIC METABOLIC PANEL WITH GFR
Anion gap: 10 (ref 5–15)
BUN: 22 mg/dL (ref 8–23)
CO2: 27 mmol/L (ref 22–32)
Calcium: 9.1 mg/dL (ref 8.9–10.3)
Chloride: 90 mmol/L — ABNORMAL LOW (ref 98–111)
Creatinine, Ser: 1.3 mg/dL — ABNORMAL HIGH (ref 0.61–1.24)
GFR, Estimated: 60 mL/min — ABNORMAL LOW (ref >60)
Glucose, Bld: 149 mg/dL — ABNORMAL HIGH (ref 70–99)
Potassium: 4 mmol/L (ref 3.5–5.1)
Sodium: 127 mmol/L — ABNORMAL LOW (ref 135–145)

## 2024-03-16 LAB — MAGNESIUM: Magnesium: 2 mg/dL (ref 1.7–2.4)

## 2024-03-16 LAB — COOXEMETRY PANEL
Carboxyhemoglobin: 1.1 % (ref 0.5–1.5)
Methemoglobin: 1.1 % (ref 0.0–1.5)
O2 Saturation: 75.3 %
Total hemoglobin: 13.4 g/dL (ref 12.0–16.0)

## 2024-03-16 MED ORDER — DIGOXIN 125 MCG PO TABS
0.1250 mg | ORAL_TABLET | Freq: Every day | ORAL | 0 refills | Status: DC
  Filled 2024-03-16: qty 30, 30d supply, fill #0

## 2024-03-16 MED ORDER — HYDROCORTISONE 5 MG PO TABS
5.0000 mg | ORAL_TABLET | Freq: Two times a day (BID) | ORAL | 0 refills | Status: AC
  Filled 2024-03-16: qty 30, 15d supply, fill #0

## 2024-03-16 MED ORDER — LOSARTAN POTASSIUM 25 MG PO TABS
25.0000 mg | ORAL_TABLET | Freq: Every day | ORAL | 0 refills | Status: DC
  Filled 2024-03-16: qty 30, 30d supply, fill #0

## 2024-03-16 NOTE — Progress Notes (Signed)
 Advanced Heart Failure Rounding Note  Cardiologist: Little Ishikawa, MD   Chief Complaint: Cardiogenic shock  Subjective:    Milrinone discontinued 4/7.   Scr stable at 1.3 Na 127   Wt continues to trend down, overall down 24 lb since admit. CVP not connected. JVD not elevated.   Feels well. No current complaints. Denies dyspnea. No orthopnea/PND.   Objective:   Weight Range: 60 kg Body mass index is 19.53 kg/m.   Vital Signs:   Temp:  [97.8 F (36.6 C)-98.2 F (36.8 C)] 97.9 F (36.6 C) (04/08 0435) Pulse Rate:  [70-91] 83 (04/08 0110) Resp:  [18-19] 18 (04/08 0435) BP: (99-122)/(35-79) 108/59 (04/08 0435) SpO2:  [98 %-100 %] 100 % (04/08 0435) Weight:  [60 kg] 60 kg (04/08 0435) Last BM Date : 03/12/24  Weight change: Filed Weights   03/14/24 0448 03/15/24 0424 03/16/24 0435  Weight: 61.5 kg 60.9 kg 60 kg    Intake/Output:   Intake/Output Summary (Last 24 hours) at 03/16/2024 0715 Last data filed at 03/16/2024 0400 Gross per 24 hour  Intake 720 ml  Output 800 ml  Net -80 ml    Physical Exam   General:  thin elderly male. No respiratory difficulty HEENT: normal Neck: supple. JVD not elevated. Carotids 2+ bilat; no bruits. No lymphadenopathy or thyromegaly appreciated. Cor: PMI nondisplaced. Regular rate & rhythm. No rubs, gallops or murmurs. Lungs: clear Abdomen: soft, nontender, nondistended.  Extremities: no cyanosis, clubbing, rash, edema Neuro: alert & oriented x 3, cranial nerves grossly intact. moves all 4 extremities w/o difficulty. Affect pleasant.   Telemetry   NSR 80s, no further NSVT. (Personally reviewed)    Labs    CBC No results for input(s): "WBC", "NEUTROABS", "HGB", "HCT", "MCV", "PLT" in the last 72 hours.  Basic Metabolic Panel Recent Labs    16/10/96 0507 03/16/24 0444  NA 126* 127*  K 3.7 4.0  CL 87* 90*  CO2 28 27  GLUCOSE 137* 149*  BUN 23 22  CREATININE 1.30* 1.30*  CALCIUM 8.9 9.1  MG 1.7 2.0    Liver Function Tests Recent Labs    03/14/24 0505  AST 95*  ALT 72*  ALKPHOS 105  BILITOT 1.1  PROT 7.1  ALBUMIN 3.2*   No results for input(s): "LIPASE", "AMYLASE" in the last 72 hours.  Cardiac Enzymes No results for input(s): "CKTOTAL", "CKMB", "CKMBINDEX", "TROPONINI" in the last 72 hours.  BNP: BNP (last 3 results) Recent Labs    02/13/24 1011 03/11/24 0955 03/11/24 1437  BNP 1,383.1* >4,500.0* >4,500.0*    ProBNP (last 3 results) No results for input(s): "PROBNP" in the last 8760 hours.   D-Dimer No results for input(s): "DDIMER" in the last 72 hours. Hemoglobin A1C No results for input(s): "HGBA1C" in the last 72 hours. Fasting Lipid Panel No results for input(s): "CHOL", "HDL", "LDLCALC", "TRIG", "CHOLHDL", "LDLDIRECT" in the last 72 hours. Thyroid Function Tests No results for input(s): "TSH", "T4TOTAL", "T3FREE", "THYROIDAB" in the last 72 hours.  Invalid input(s): "FREET3"  Other results:   Imaging   No results found.   Medications:   Scheduled Medications:  Chlorhexidine Gluconate Cloth  6 each Topical Daily   digoxin  0.125 mg Oral Daily   docusate sodium  100 mg Oral Daily   empagliflozin  10 mg Oral Daily   folic acid  1 mg Oral Daily   heparin  5,000 Units Subcutaneous Q8H   hydrocortisone  5 mg Oral BID   losartan  25 mg Oral Daily   multivitamin with minerals  1 tablet Oral Daily   sodium chloride flush  10-40 mL Intracatheter Q12H   sodium chloride flush  3 mL Intravenous Q12H   thiamine  100 mg Oral Daily    Infusions:    PRN Medications: acetaminophen, bisacodyl, LORazepam, senna, sodium chloride flush, sodium chloride flush Patient Profile   68 y.o. male with history of chronic systolic CHF/NICM, LBBB, ETOH abuse, hyponatremia, possible adrenal insufficiency. Presenting with cardiogenic shock, AKI and hyponatremia.  Assessment/Plan   1. Acute on chronic systolic CHF>>cardiogenic shock: NICM, possibly due to ETOH.   Echo 3/21 EF 20%, moderately decreased RV.  RHC/LHC 3/21 no significant coronary disease, preserved cardiac output, R>L heart failure.  Cardiac MRI in 4/21 showed EF 14%, severe RV dysfunction. No LGE but difficult LGE images, cannot rule out prior myocarditis.  TEE in 5/23 showed EF 50-55%, normal RV, trivial MR, mobile RA structure seen on TTE was [facility]. Echo in 8/24 with drop in EF to 25-30% in setting of heavy ETOH intake and noncompliance with meds. Echo 12/24 EF 25-30% on GDMT. Echo 4/25 EF 20-25%, LV with GHK, RV mod reduced. He has cut back some on ETOH but is still drinking.  - Lactic acid 4.7>> now cleared - Diuresed well w/ milrinone support, down 24 lb since admit. Euvolemic on exam - Milrinone discontinued 4/7. Co-ox off milrinone 75%.  - restart PO diuretics Lasix 60 mg daily  - No beta blocker with w/ recent shock - Continue losartan 25 mg daily - Continue digoxin 0.125 mg daily - Continue jardiance 10 mg daily - Encouraged to continue to cut back on ETOH use.   - Seen by Dr. Graciela Husbands for CRT-D. Not sure that with age whether ICD would be beneficial for mortality. It was thought that LBBB was more rate dependent and would benefit from better rate control. Beta blocker not currently a good choice for him. - Would likely not be a candidate for VAD with RV dysfunction. Not transplant candidate d/t ETOH abuse.   2. LBBB:  - Repeat Echo 12/24: 25-30% after compliance with medications and cutting back on ETOH. - See above CRT-D discussion   3. ETOH abuse: Long history of heavy ETOH. This may be the cause of his cardiomyopathy.  Reports drinking 3-4 beers daily, recommended cessation.   4. Hyponatremia:  - Chronic. Typically upper 120s-130s, Na down to 120 this admit. Improved, 127 today  - Likely d/t liver disease +/- CHF and ETOH. - Evidence of cirrhosis noted on [place] in 2021.    5. Adrenal insufficiency: Low am cortisol during recent admit. He was placed on hydrocortisone    6. AKI: - In setting of cardiogenic shock - SCr 1.23>1.3>>1.3 today - will obtain f/u BMP next wk, post hospital    7. N/V -Likely d/t cardiogenic shock.  -resolved with inotrope support  Ok for D/c home today from HF standpoint. We will arrange post hospital f/u in our clinic and will place appt info in AVS. We will see if we can get [facility] HF fluid monitoring arranged prior to d/c.   Cardiac Meds for Discharge Digoxin 0.125 mg daily  Jardiance 10 mg daily  Losartan 25 mg daily  Lasix 60 mg daily     Length of Stay: [address], PA-C  03/16/2024, 7:15 AM  Advanced Heart Failure Team Pager [phone] (M-F; 7a - 5p)  Please contact [facility] for night-coverage after hours (5p -7a )  and weekends on [email or web address]

## 2024-03-16 NOTE — TOC Transition Note (Addendum)
 Transition of Care [facility]) - Discharge Note   Patient Details  Name: Austin [name] MRN: [id number] Date of Birth: 06/12/1956  [facility] CM/SW Contact:  Leone Haven, RN Phone Number: 03/16/2024, 12:26 PM   Clinical Narrative:    For dc today, he states he does not want outpt physical therapy. He has transport.  He is also a patient with paramedicine.  NCM offered choice for [facility], he states he does not want  a HHRN.      Barriers to Discharge: Continued Medical Work up   Patient Goals and CMS Choice Patient states their goals for this hospitalization and ongoing recovery are:: wants to remain independent          Discharge Placement                       Discharge Plan and Services Additional resources added to the After Visit Summary for     Discharge Planning Services: CM Consult                                 Social Drivers of Health (SDOH) Interventions SDOH Screenings   Food Insecurity: No Food Insecurity (03/11/2024)  Housing: Low Risk  (03/11/2024)  Transportation Needs: No Transportation Needs (03/11/2024)  Utilities: Not At Risk (03/11/2024)  Alcohol Screen: Low Risk  (02/11/2024)  Depression (PHQ2-9): Low Risk  (02/11/2024)  Financial Resource Strain: Low Risk  (02/11/2024)  Physical Activity: Insufficiently Active (02/11/2024)  Social Connections: Socially Integrated (03/11/2024)  Stress: No Stress Concern Present (02/11/2024)  Tobacco Use: Low Risk  (03/11/2024)  Health Literacy: Adequate Health Literacy (02/11/2024)     Readmission Risk Interventions    01/15/2024   12:10 PM 01/12/2024    1:06 PM  Readmission Risk Prevention Plan  Transportation Screening Complete Complete  PCP or Specialist Appt within 5-7 Days  Complete  Home Care Screening  Complete  Medication Review (RN CM)  Complete

## 2024-03-16 NOTE — Discharge Summary (Addendum)
 Physician Discharge Summary   Patient: Austin [name] MRN: [id number] DOB: Apr 20, 1956  Admit date:     03/11/2024  Discharge date: 03/16/24  Discharge Physician: York Ram Yaelis Scharfenberg   PCP: [name], FNP   Recommendations at discharge:    Patient will continue heart failure guideline directed medical therapy with digoxin, losartan and SGLT 2 inh. No B blocker due to low EF, holding on spironolactone for now.  Diuresis with furosemide 60 mg po daily.  Hydrocortisone has been changed to 5 mg bid for 15 days, then can be changed to 5 mg daily and continue to taper off.  Follow up with Jackquline Bosch FNP in 7 to 10 days Follow up renal function and electrolytes in 7 days Follow up with Cardiology as scheduled, a [employer] HFMS home monitor will be arranged.   Discharge Diagnoses: Principal Problem:   Acute on chronic systolic CHF (congestive heart failure) (HCC) Active Problems:   Essential hypertension   AKI (acute kidney injury) (HCC)   Alcohol use disorder   Adrenal insufficiency (HCC)  Resolved Problems:   * No resolved hospital problems. [facility] Course: [name] was admitted to the hospital with the working diagnosis of acute on chronic heart failure exacerbation.   68 yo male with the past medical history of heart failure, alcohol abuse, who presented with dyspnea.  Recent hospitalization 02/01 to 01/15/24 for left hip fracture, with post op hypotension in the setting of adrenal insufficiency.  04/03 outpatient follow up with heart failure clinic, he was found hypervolemic and blood work was obtained. Serum sodium was found to be low and he was advised to come to the ED.  On his initial physical examination his blood pressure was 130/93, HR 98, RR 18 and 02 saturation 100%.  Lungs with no wheezing or rhonchi, positive rales, heart with S1 and S2 present and regular with no gallops or rubs, abdomen with no distention and no lower extremity edema.   Na 120, K 4,0 CL 87,  bicarbonate 14, glucose 105 bun 13 cr 1,47  BNP > 4,500  Wbc 10,1 hgb 11,8 plt 221   Chest radiograph with hypoinflation, right base atelectasis, bilateral hilar vascular congestion with bilateral pleural effusions, positive cardiomegaly.   EKG 110, left axis deviation, qtc 538, left bundle branch block, with sinus rhythm with no significant ST segment or T wave changes. Positive LVH.   Patient was placed on furosemide IV and milrinone   04/05 volume status is improving.  04/06 improving hemodynamics, continue on inotropic therapy.  04/07 off milrinone.  04/08 patient continue to be stable off inotropic support, plan to discharge home today with follow up as outpatient. Patient will be enrolled in [employer] HFMS prior to his discharge.   Assessment and Plan: * Acute on chronic systolic CHF (congestive heart failure) (HCC) Echocardiogram with reduced LV systolic function with EF 20 to 25%, global hypokinesis, RV systolic function with moderate reduction, RV with moderate enlargement, RVSP 43.3 mmHg,   Patient was placed on IV furosemide and inotropic support with milrinone, negative fluid balance was achieved, - 6,731 ml, with significant improvement in his symptoms.  Off milrinone his discharge SVO2 is 75.3    Plan to continue medical therapy with digoxin, losartan, and SGLT 2 inh.  Loop diuretic with furosemide 60 mg po daily.   Positive hepatic congestion due to heart failure. His liver enzymes are trending down with AST 95 and ALT 72    Patient will need close follow up  as outpatient.   Essential hypertension Continue losartan.   AKI (acute kidney injury) (HCC) Hyponatremia.   Volume status has improved, at the time of his discharge his serum cr is 1,30 with K at 4,0 and serum bicarbonate at 27  Na 127 and Mg 2,0   Continue diuresis with furosemide 60 mg po daily. Follow up renal function and electrolytes as outpatient.   Alcohol use disorder No current signs of acute alcohol  withdrawal.  Patient was placed on as needed oral lorazepam with good toleration.   Adrenal insufficiency (HCC) Tolerating well reduced dose of hydrocortisone to  5 mg po bid, continue to wean off as outpatient.          Consultants: cardiology  Procedures performed: none   Disposition: Home Diet recommendation:  Cardiac diet DISCHARGE MEDICATION: Allergies as of 03/16/2024       Reactions   Latex Swelling, Rash        Medication List     STOP taking these medications    metoprolol succinate 25 MG 24 hr tablet Commonly known as: Toprol XL   spironolactone 25 MG tablet Commonly known as: ALDACTONE       TAKE these medications    digoxin 0.125 MG tablet Commonly known as: LANOXIN Take 1 tablet (0.125 mg total) by mouth daily. Start taking on: March 17, 2024   docusate sodium 100 MG capsule Commonly known as: COLACE Take 1 capsule (100 mg total) by mouth 2 (two) times daily. What changed:  when to take this reasons to take this   empagliflozin 10 MG Tabs tablet Commonly known as: Jardiance Take 1 tablet (10 mg total) by mouth daily before breakfast.   ferrous sulfate 325 (65 FE) MG tablet Take 1 tablet (325 mg total) by mouth daily with breakfast.   furosemide 40 MG tablet Commonly known as: LASIX Take 1.5 tablets (60 mg total) by mouth daily.   hydrocortisone 5 MG tablet Commonly known as: CORTEF Take 1 tablet (5 mg total) by mouth 2 (two) times daily for 15 days. What changed:  medication strength how much to take when to take this additional instructions   loperamide 2 MG capsule Commonly known as: IMODIUM Take 2 mg by mouth every 6 (six) hours as needed for diarrhea or loose stools.   losartan 25 MG tablet Commonly known as: COZAAR Take 1 tablet (25 mg total) by mouth daily.   multivitamin tablet Take 1 tablet by mouth daily.   senna 8.6 MG Tabs tablet Commonly known as: SENOKOT Take 1 tablet (8.6 mg total) by mouth 2 (two) times  daily. What changed:  when to take this reasons to take this        Follow-up Information     [facility] Follow up on 03/26/2024.   Specialty: Cardiology Why: Advanced Heart Failure Clinic 03/26/24 at 11:00 AM Entrance C, Free Valet Parking Please bring all meds to appointment Contact information: [address] [place] [address] [phone]        [name], FNP. Go in 2 day(s).   Specialty: General Practice Why: Hospital follow up appointment scheduled for Wedesday, March 17, 2024 at 8:20 AM.  PLEASE ARRIVE 10-15 minutes early.  PLEASE call to cancel/reschedule appointment if you CANNOT make it. Contact information: [address] [place] [address] [phone]                Discharge Exam: Ceasar Mons Weights  03/14/24 0448 03/15/24 0424 03/16/24 0435  Weight: 61.5 kg 60.9 kg 60 kg   BP 110/71 (BP Location: Right Leg)   Pulse 75   Temp 98.3 F (36.8 C) (Oral)   Resp 18   Ht 5\' 9"  (1.753 m)   Wt 60 kg   SpO2 100%   BMI 19.53 kg/m   Neurology awake and alert ENT with no pallor Cardiovascular with S1 and S2 present and regular with no gallops, or rubs, positive systolic murmur at the right lower sternal border No JVD No lower extremity edema Respiratory with no rales or wheezing, no rhonchi  Abdomen with no distention   Condition at discharge: stable  The results of significant diagnostics from this hospitalization (including imaging, microbiology, ancillary and laboratory) are listed below for reference.   Imaging Studies: ECHOCARDIOGRAM COMPLETE Result Date: 03/12/2024    ECHOCARDIOGRAM REPORT   Patient Name:   JAHMIL MACLEOD Date of Exam: 03/12/2024 Medical Rec #:  [id number]        Height:       69.0 in Accession #:    [id number]       Weight:       150.4 lb Date of Birth:  18-Aug-1956       BSA:          1.830 m Patient Age:    67 years         BP:           122/90  mmHg Patient Gender: M                HR:           91 bpm. Exam Location:  Inpatient Procedure: 2D Echo, Cardiac Doppler and Color Doppler (Both Spectral and Color            Flow Doppler were utilized during procedure). Indications:    Congestive Heart Failure I50.9  History:        Patient has prior history of Echocardiogram examinations, most                 recent 11/12/2023. CHF; Risk Factors:Hypertension.  Sonographer:    Darlys Gales Referring Phys: [phone] LINDSAY NICOLE FINCH IMPRESSIONS  1. Left ventricular ejection fraction, by estimation, is 20 to 25%. The left ventricle has severely decreased function. The left ventricle demonstrates global hypokinesis. Indeterminate diastolic filling due to E-A fusion.  2. Right ventricular systolic function is moderately reduced. The right ventricular size is moderately enlarged. There is mildly elevated pulmonary artery systolic pressure. The estimated right ventricular systolic pressure is 43.3 mmHg.  3. The mitral valve is normal in structure. No evidence of mitral valve regurgitation. No evidence of mitral stenosis.  4. The aortic valve is normal in structure. Aortic valve regurgitation is not visualized. No aortic stenosis is present. FINDINGS  Left Ventricle: Left ventricular ejection fraction, by estimation, is 20 to 25%. The left ventricle has severely decreased function. The left ventricle demonstrates global hypokinesis. The left ventricular internal cavity size was normal in size. There is no left ventricular hypertrophy. Indeterminate diastolic filling due to E-A fusion. Right Ventricle: The right ventricular size is moderately enlarged. No increase in right ventricular wall thickness. Right ventricular systolic function is moderately reduced. There is mildly elevated pulmonary artery systolic pressure. The tricuspid regurgitant velocity is 2.97 m/s, and with an assumed right atrial pressure of 8 mmHg, the estimated right ventricular systolic pressure is 43.3  mmHg. Left Atrium: Left atrial size was  normal in size. Right Atrium: Right atrial size was normal in size. Pericardium: There is no evidence of pericardial effusion. Mitral Valve: The mitral valve is normal in structure. No evidence of mitral valve regurgitation. No evidence of mitral valve stenosis. Tricuspid Valve: The tricuspid valve is normal in structure. Tricuspid valve regurgitation is mild . No evidence of tricuspid stenosis. Aortic Valve: The aortic valve is normal in structure. Aortic valve regurgitation is not visualized. No aortic stenosis is present. Aortic valve mean gradient measures 4.0 mmHg. Aortic valve peak gradient measures 6.7 mmHg. Aortic valve area, by VTI measures 1.72 cm. Pulmonic Valve: The pulmonic valve was normal in structure. Pulmonic valve regurgitation is not visualized. No evidence of pulmonic stenosis. Aorta: The aortic root is normal in size and structure. Venous: The inferior vena cava was not well visualized. IAS/Shunts: No atrial level shunt detected by color flow Doppler.  LEFT VENTRICLE PLAX 2D LVIDd:         5.00 cm LVIDs:         4.80 cm LV PW:         1.00 cm LV IVS:        0.90 cm LVOT diam:     1.90 cm LV SV:         35 LV SV Index:   19 LVOT Area:     2.84 cm  RIGHT VENTRICLE RV S prime:     7.94 cm/s TAPSE (M-mode): 2.0 cm LEFT ATRIUM             Index        RIGHT ATRIUM           Index LA Vol (A2C):   50.4 ml 27.54 ml/m  RA Area:     16.90 cm LA Vol (A4C):   31.5 ml 17.21 ml/m  RA Volume:   40.60 ml  22.19 ml/m LA Biplane Vol: 43.1 ml 23.55 ml/m  AORTIC VALVE AV Area (Vmax):    1.77 cm AV Area (Vmean):   1.99 cm AV Area (VTI):     1.72 cm AV Vmax:           129.00 cm/s AV Vmean:          88.400 cm/s AV VTI:            0.204 m AV Peak Grad:      6.7 mmHg AV Mean Grad:      4.0 mmHg LVOT Vmax:         80.40 cm/s LVOT Vmean:        62.200 cm/s LVOT VTI:          0.124 m LVOT/AV VTI ratio: 0.61  AORTA Ao Root diam: 2.80 cm MITRAL VALVE               TRICUSPID  VALVE MV Area (PHT): 5.16 cm    TR Peak grad:   35.3 mmHg MV Decel Time: 147 msec    TR Vmax:        297.00 cm/s MV E velocity: 54.20 cm/s                            SHUNTS                            Systemic VTI:  0.12 m  Systemic Diam: 1.90 cm Clearnce Hasten Electronically signed by Clearnce Hasten Signature Date/Time: 03/12/2024/9:41:43 AM    Final    [place] EKG SITE RITE Result Date: 03/11/2024 If Site Rite image not attached, placement could not be confirmed due to current cardiac rhythm.  [place] EKG SITE RITE Result Date: 03/11/2024 If Site Rite image not attached, placement could not be confirmed due to current cardiac rhythm.  DG Chest 2 View Result Date: 03/11/2024 CLINICAL DATA:  Shortness of breath EXAM: CHEST - 2 VIEW COMPARISON:  Chest x-ray 05/26/2023 FINDINGS: The heart is enlarged. There are bands of atelectasis in both lung bases. There small bilateral pleural effusions. There is no pneumothorax or acute fracture. IMPRESSION: 1. Cardiomegaly with small bilateral pleural effusions. 2. Bands of atelectasis in both lung bases. Electronically Signed   By: Darliss Cheney M.D.   On: 03/11/2024 15:32    Microbiology: Results for orders placed or performed during the hospital encounter of 03/11/24  Resp panel by RT-PCR (RSV, Flu A&B, Covid) Anterior Nasal Swab     Status: None   Collection Time: 03/11/24  3:23 PM   Specimen: Anterior Nasal Swab  Result Value Ref Range Status   SARS Coronavirus 2 by RT PCR NEGATIVE NEGATIVE Final   Influenza A by PCR NEGATIVE NEGATIVE Final   Influenza B by PCR NEGATIVE NEGATIVE Final    Comment: (NOTE) The Xpert Xpress SARS-CoV-2/FLU/RSV plus assay is intended as an aid in the diagnosis of influenza from Nasopharyngeal swab specimens and should not be used as a sole basis for treatment. Nasal washings and aspirates are unacceptable for Xpert Xpress SARS-CoV-2/FLU/RSV testing.  Fact Sheet for  Patients: BloggerCourse.com  Fact Sheet for Healthcare Providers: SeriousBroker.it  This test is not yet approved or cleared by the [place] FDA and has been authorized for detection and/or diagnosis of SARS-CoV-2 by FDA under an Emergency Use Authorization (EUA). This EUA will remain in effect (meaning this test can be used) for the duration of the COVID-19 declaration under Section 564(b)(1) of the Act, 21 U.S.C. section 360bbb-3(b)(1), unless the authorization is terminated or revoked.     Resp Syncytial Virus by PCR NEGATIVE NEGATIVE Final    Comment: (NOTE) Fact Sheet for Patients: BloggerCourse.com  Fact Sheet for Healthcare Providers: SeriousBroker.it  This test is not yet approved or cleared by the [place] FDA and has been authorized for detection and/or diagnosis of SARS-CoV-2 by FDA under an Emergency Use Authorization (EUA). This EUA will remain in effect (meaning this test can be used) for the duration of the COVID-19 declaration under Section 564(b)(1) of the Act, 21 U.S.C. section 360bbb-3(b)(1), unless the authorization is terminated or revoked.  Performed at [facility], [address], [place], [place] [address]     Labs: CBC: Recent Labs  Lab 03/11/24 1438  WBC 10.1  NEUTROABS 8.2*  HGB 11.8*  HCT 33.6*  MCV 92.1  PLT 221   Basic Metabolic Panel: Recent Labs  Lab 03/12/24 0317 03/13/24 0500 03/14/24 0505 03/15/24 0507 03/16/24 0444  NA 122* 126* 128* 126* 127*  K 4.1 2.8* 3.4* 3.7 4.0  CL 85* 84* 87* 87* 90*  CO2 20* 27 29 28 27   GLUCOSE 88 106* 115* 137* 149*  BUN 15 16 17 23 22   CREATININE 1.30* 1.22 1.23 1.30* 1.30*  CALCIUM 8.4* 8.5* 8.8* 8.9 9.1  MG 1.2* 2.7* 2.2 1.7 2.0   Liver Function Tests: Recent Labs  Lab 03/11/24 1438 03/12/24 0317 03/14/24 0505  AST 132* 108* 95*  ALT 61* 58* 72*  ALKPHOS 110 97 105   BILITOT 2.6* 2.6* 1.1  PROT 7.1 6.7 7.1  ALBUMIN 3.2* 3.1* 3.2*   CBG: Recent Labs  Lab 03/11/24 2106 03/12/24 0553  GLUCAP 103* 99    Discharge time spent: greater than 30 minutes.  Signed: Coralie Keens, MD [facility] 03/16/2024

## 2024-03-16 NOTE — Telephone Encounter (Signed)
 Called Austin [name] to f/u from today's hospital discharge.  Attempted to schedule him for 1:00 4/9 and he states that he has a 9:30 appointment in the morning.  I urged him to let me come at 1:00 so that I can get his med changes setup in his pill box.  He said he could not commit to tomorrow just yet and will call me back tomorrow to let me know.   I advised him that I would pencil him in my calendar and wait to hear from him.  He acknowledged he would let me know.    Beatrix Shipper, EMT-Paramedic [phone] 03/16/2024

## 2024-03-17 ENCOUNTER — Other Ambulatory Visit (HOSPITAL_COMMUNITY): Payer: Self-pay | Admitting: Emergency Medicine

## 2024-03-17 ENCOUNTER — Telehealth: Payer: Self-pay

## 2024-03-17 ENCOUNTER — Inpatient Hospital Stay: Admitting: Family Medicine

## 2024-03-17 DIAGNOSIS — I5021 Acute systolic (congestive) heart failure: Secondary | ICD-10-CM | POA: Diagnosis not present

## 2024-03-17 NOTE — Transitions of Care (Post Inpatient/ED Visit) (Addendum)
   03/17/2024  Name: NEELY CECENA MRN: [id number] DOB: March 02, 1956  Today's TOC FU Call Status: Today's TOC FU Call Status:: Unsuccessful Call (1st Attempt) Unsuccessful Call (1st Attempt) Date: 03/17/24  Attempted to reach the patient regarding the most recent Inpatient/ED visit. Patient reports paramedicine visiting presently.  Asked for a call back another time.    Follow Up Plan: Additional outreach attempts will be made to reach the patient to complete the Transitions of Care (Post Inpatient/ED visit) call.   Bary Leriche RN, MSN [facility], [facility] RN Care Manager Direct Dial: [phone]  Fax: [phone] Website: [email or web address]

## 2024-03-17 NOTE — Progress Notes (Unsigned)
 Paramedicine Encounter    Patient ID: Austin [name], male    DOB: Jun 11, 1956, 68 y.o.   MRN: [id number]   Complaints***  Assessment***  Compliance with meds***  Pill box filled***  Refills needed***  Meds changes since last visit***    Social changes***   There were no vitals taken for this visit. Weight yesterday-*** Last visit weight-***  ACTION: {Paramed Action:[phone]}  Beatrix Shipper, EMT-Paramedic [phone] 03/17/24  Patient Care Team: [name], FNP as PCP - General (General Practice) Little Ishikawa, MD as PCP - Cardiology (Cardiology)  Patient Active Problem List   Diagnosis Date Noted  . Acute on chronic systolic CHF (congestive heart failure) (HCC) 03/11/2024  . Abnormal levels of other serum enzymes 03/11/2024  . Anemia 03/11/2024  . Rectal bleeding 03/11/2024  . ABLA (acute blood loss anemia) 01/13/2024  . Adrenal insufficiency (HCC) 01/12/2024  . Closed left hip fracture (HCC) 01/10/2024  . Transient hypotension 06/05/2023  . Alcohol use disorder 06/05/2023  . 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 Elevated brain natriuretic peptide (BNP) level 06/05/2023  . Elevated LFTs 05/18/2023  . Prolonged QT interval 05/18/2023  . AKI (acute kidney injury) (HCC) 05/17/2023  . Iron deficiency anemia 09/03/2020  . NICM (nonischemic cardiomyopathy) (HCC)   . ETOH abuse 11/10/2018  . Essential hypertension 11/10/2018  . Blood in stool 11/10/2018    Current Outpatient Medications:  .  digoxin (LANOXIN) 0.125 MG tablet, Take 1 tablet (0.125 mg total) by mouth daily., Disp: 30 tablet, Rfl: 0 .  empagliflozin (JARDIANCE) 10 MG TABS tablet, Take 1 tablet (10 mg total) by mouth daily before breakfast., Disp: 90 tablet, Rfl: 3 .  ferrous sulfate 325 (65 FE) MG tablet, Take 1 tablet (325 mg total) by mouth daily with breakfast., Disp: 30 tablet, Rfl: 3 .  furosemide  (LASIX) 40 MG tablet, Take 1.5 tablets (60 mg total) by mouth daily., Disp: 90 tablet, Rfl: 3 .  hydrocortisone (CORTEF) 5 MG tablet, Take 1 tablet (5 mg total) by mouth 2 (two) times daily for 15 days., Disp: 30 tablet, Rfl: 0 .  loperamide (IMODIUM) 2 MG capsule, Take 2 mg by mouth every 6 (six) hours as needed for diarrhea or loose stools., Disp: , Rfl:  .  losartan (COZAAR) 25 MG tablet, Take 1 tablet (25 mg total) by mouth daily., Disp: 30 tablet, Rfl: 0 .  docusate sodium (COLACE) 100 MG capsule, Take 1 capsule (100 mg total) by mouth 2 (two) times daily. (Patient not taking: Reported on 03/17/2024), Disp: 10 capsule, Rfl: 0 .  Multiple Vitamin (MULTIVITAMIN) tablet, Take 1 tablet by mouth daily. (Patient not taking: Reported on 03/17/2024), Disp: , Rfl:  .  senna (SENOKOT) 8.6 MG TABS tablet, Take 1 tablet (8.6 mg total) by mouth 2 (two) times daily. (Patient not taking: Reported on 03/17/2024), Disp: 120 tablet, Rfl: 0 Allergies  Allergen Reactions  . Latex Swelling and Rash     Social History   Socioeconomic History  . Marital status: Married    Spouse name: Not on file  . Number of children: 2  . Years of education: Not on file  . Highest education level: Bachelor's degree (e.g., BA, AB, BS)  Occupational History  . Not on file  Tobacco Use  . Smoking status: Never  . Smokeless tobacco: Never  Vaping Use  . Vaping status: Never Used  Substance and Sexual Activity  . Alcohol use: Not Currently  Comment: 1-3 beers daily  . Drug use: No  . Sexual activity: Yes  Other Topics Concern  . Not on file  Social History Narrative  . Not on file   Social Drivers of Health   Financial Resource Strain: Low Risk  (02/11/2024)   Overall Financial Resource Strain (CARDIA)   . Difficulty of Paying Living Expenses: Not hard at all  Food Insecurity: No Food Insecurity (03/11/2024)   Hunger Vital Sign   . Worried About Programme researcher, broadcasting/film/video in the Last Year: Never true   . Ran Out of Food in  the Last Year: Never true  Transportation Needs: No Transportation Needs (03/11/2024)   PRAPARE - Transportation   . Lack of Transportation (Medical): No   . Lack of Transportation (Non-Medical): No  Physical Activity: Insufficiently Active (02/11/2024)   Exercise Vital Sign   . Days of Exercise per Week: 2 days   . Minutes of Exercise per Session: 40 min  Stress: No Stress Concern Present (02/11/2024)   [employer] of Occupational Health - Occupational Stress Questionnaire   . Feeling of Stress : Not at all  Social Connections: Socially Integrated (03/11/2024)   Social Connection and Isolation Panel [NHANES]   . Frequency of Communication with Friends and Family: Never   . Frequency of Social Gatherings with Friends and Family: More than three times a week   . Attends Religious Services: More than 4 times per year   . Active Member of Clubs or Organizations: Yes   . Attends Banker Meetings: More than 4 times per year   . Marital Status: Married  Catering manager Violence: Not At Risk (03/11/2024)   Humiliation, Afraid, Rape, and Kick questionnaire   . Fear of Current or Ex-Partner: No   . Emotionally Abused: No   . Physically Abused: No   . Sexually Abused: No    Physical Exam      Future Appointments  Date Time Provider Department Center  03/18/2024 11:00 AM Ellender Hose, NP [facility] None  03/26/2024 11:00 AM [facility] PA/NP [facility] None  04/08/2024  9:30 AM [facility] PA/NP [facility] None  03/16/2025  2:40 PM [facility]-ANNUAL WELLNESS VISIT [facility] None

## 2024-03-18 ENCOUNTER — Telehealth: Payer: Self-pay

## 2024-03-18 ENCOUNTER — Encounter: Payer: Self-pay | Admitting: Family Medicine

## 2024-03-18 ENCOUNTER — Ambulatory Visit: Admitting: Family Medicine

## 2024-03-18 VITALS — BP 112/80 | HR 89 | Temp 98.1°F | Ht 69.0 in | Wt 150.0 lb

## 2024-03-18 DIAGNOSIS — I428 Other cardiomyopathies: Secondary | ICD-10-CM | POA: Diagnosis not present

## 2024-03-18 DIAGNOSIS — I110 Hypertensive heart disease with heart failure: Secondary | ICD-10-CM

## 2024-03-18 DIAGNOSIS — E2740 Unspecified adrenocortical insufficiency: Secondary | ICD-10-CM

## 2024-03-18 DIAGNOSIS — F1090 Alcohol use, unspecified, uncomplicated: Secondary | ICD-10-CM

## 2024-03-18 DIAGNOSIS — N179 Acute kidney failure, unspecified: Secondary | ICD-10-CM | POA: Diagnosis not present

## 2024-03-18 DIAGNOSIS — I5042 Chronic combined systolic (congestive) and diastolic (congestive) heart failure: Secondary | ICD-10-CM

## 2024-03-18 DIAGNOSIS — E871 Hypo-osmolality and hyponatremia: Secondary | ICD-10-CM | POA: Diagnosis not present

## 2024-03-18 DIAGNOSIS — I5023 Acute on chronic systolic (congestive) heart failure: Secondary | ICD-10-CM

## 2024-03-18 NOTE — Progress Notes (Signed)
 I,Jameka J Llittleton, CMA,acting as a Neurosurgeon for [employer], NP.,have documented all relevant documentation on the behalf of Melodie Spry, NP,as directed by  Melodie Spry, NP while in the presence of Melodie Spry, NP.  Subjective:  Patient ID: Austin [name] , male    DOB: 06-25-1956 , 68 y.o.   MRN: [id number]  No chief complaint on file.   HPI  Patient is a 68 year old male with medical history of Congestive heart failure, alcohol  abuse, presents today for hospital follow up. Patient was admitted on 03/11/2024 and discharged on 03/16/2024 with a diagnosis of acute on chronic heart failure exacerbation.     On 04/03 at outpatient follow up with heart failure clinic, he was found hypervolemic and blood work was obtained. Serum sodium was found to be low and he was advised to come to the ED.    BNP > 4,500   Chest radiograph with hypoinflation, right base atelectasis, bilateral hilar vascular congestion with bilateral pleural effusions, positive cardiomegaly.    Patient was placed on furosemide  IV and milrinone  while in the hospital. He was  enrolled in Zoll HFMS home monitor prior to his discharge, to follow up with Cardiology.   Will recheck electrolytes and renal function in 1 week per discharge instructions.     Past Medical History:  Diagnosis Date   Arthritis    hands   CHF (congestive heart failure) (HCC)    GERD (gastroesophageal reflux disease)    Hypertension    Hypomagnesemia    Hyponatremia    Prostate cancer (HCC)      Family History  Problem Relation Age of Onset   Hypertension Mother    Hypertension Father      Current Outpatient Medications:    docusate sodium  (COLACE) 100 MG capsule, Take 1 capsule (100 mg total) by mouth 2 (two) times daily. (Patient not taking: Reported on 03/23/2024), Disp: 10 capsule, Rfl: 0   empagliflozin  (JARDIANCE ) 10 MG TABS tablet, Take 1 tablet (10 mg total) by mouth daily before breakfast., Disp: 90 tablet, Rfl: 3   ferrous sulfate   325 (65 FE) MG tablet, Take 1 tablet (325 mg total) by mouth daily with breakfast., Disp: 30 tablet, Rfl: 3   furosemide  (LASIX ) 40 MG tablet, Take 1.5 tablets (60 mg total) by mouth daily., Disp: 90 tablet, Rfl: 3   hydrocortisone  (CORTEF ) 5 MG tablet, Take 1 tablet (5 mg total) by mouth 2 (two) times daily for 15 days., Disp: 30 tablet, Rfl: 0   loperamide (IMODIUM) 2 MG capsule, Take 2 mg by mouth every 6 (six) hours as needed for diarrhea or loose stools., Disp: , Rfl:    digoxin  (LANOXIN ) 0.125 MG tablet, Take 1 tablet (0.125 mg total) by mouth daily., Disp: 90 tablet, Rfl: 3   losartan  (COZAAR ) 25 MG tablet, Take 1 tablet (25 mg total) by mouth daily., Disp: 90 tablet, Rfl: 3   Multiple Vitamin (MULTIVITAMIN) tablet, Take 1 tablet by mouth daily. (Patient not taking: Reported on 03/26/2024), Disp: , Rfl:    senna (SENOKOT) 8.6 MG TABS tablet, Take 1 tablet (8.6 mg total) by mouth 2 (two) times daily. (Patient not taking: Reported on 03/17/2024), Disp: 120 tablet, Rfl: 0   spironolactone  (ALDACTONE ) 25 MG tablet, Take 0.5 tablets (12.5 mg total) by mouth daily., Disp: 45 tablet, Rfl: 3   Allergies  Allergen Reactions   Latex Swelling and Rash     Review of Systems  Constitutional: Negative.   HENT: Negative.  Respiratory: Negative.    Skin: Negative.   Neurological:  Negative for seizures, syncope and weakness.  Psychiatric/Behavioral: Negative.       Today's Vitals   03/18/24 1122  BP: 112/80  Pulse: 89  Temp: 98.1 F (36.7 C)  TempSrc: Oral  Weight: 150 lb (68 kg)  Height: 5\' 9"  (1.753 m)  PainSc: 0-No pain   Body mass index is 22.15 kg/m.  Wt Readings from Last 3 Encounters:  03/26/24 150 lb 3.2 oz (68.1 kg)  03/26/24 150 lb (68 kg)  03/23/24 153 lb 6.4 oz (69.6 kg)    The ASCVD Risk score (Arnett DK, et al., 2019) failed to calculate for the following reasons:   The valid total cholesterol range is 130 to 320 mg/dL  Objective:  Physical Exam HENT:     Head:  Normocephalic.  Cardiovascular:     Rate and Rhythm: Normal rate and regular rhythm.     Heart sounds: Murmur heard.  Pulmonary:     Effort: Pulmonary effort is normal.     Breath sounds: Normal breath sounds.  Neurological:     General: No focal deficit present.     Mental Status: He is alert and oriented to person, place, and time.  Psychiatric:        Mood and Affect: Mood normal.         Assessment And Plan:  Hyponatremia -     BMP8+eGFR; Future -     CBC; Future  Hypertensive 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 (HCC) Assessment & Plan: BNP > 4500, on admission , was on Furosemide  IV.  now Furosemide  60 mg by mouth daily.   Alcohol  use disorder Assessment & Plan: Has not had any drink since hospitalization, no signs of withdrawal noted   Adrenal insufficiency (HCC) Assessment & Plan: Continue Hydrocortisone  5 mg twice daily    AKI (acute kidney injury) (HCC) Assessment & Plan: Recheck labs in 1 week     Return if symptoms worsen or fail to improve, for lab visit in 1 week.  Patient was given opportunity to ask questions. Patient verbalized understanding of the plan and was able to repeat key elements of the plan. All questions were answered to their satisfaction.    I, Melodie Spry, NP, have reviewed all documentation for this visit. The documentation on 03/29/2024 for the exam, diagnosis, procedures, and orders are all accurate and complete.    IF YOU HAVE BEEN REFERRED TO A SPECIALIST, IT MAY TAKE 1-2 WEEKS TO SCHEDULE/PROCESS THE REFERRAL. IF YOU HAVE NOT HEARD FROM US /SPECIALIST IN TWO WEEKS, PLEASE GIVE US  A CALL AT [phone].

## 2024-03-18 NOTE — Transitions of Care (Post Inpatient/ED Visit) (Signed)
   03/18/2024  Name: VAUN HYNDMAN MRN: [id number] DOB: March 10, 1956  Today's TOC FU Call Status: Today's TOC FU Call Status:: Unsuccessful Call (2nd Attempt) Unsuccessful Call (1st Attempt) Date: 03/17/24 Unsuccessful Call (2nd Attempt) Date: 03/18/24  Attempted to reach the patient regarding the most recent Inpatient/ED visit.  Patient was called in an Outreach attempt to offer VBCI  30-day TOC program. Pt is eligible for program due to potential risk for readmission and/or high utilization. Unfortunately, I was not able to speak with the patient in regards to recent hospital discharge   Left a HIPAA compliant phone message for patient including VBCI CM contact information with request for a call back in regard to recent hospital discharge  Follow Up Plan: Additional outreach attempts will be made to reach the patient to complete the Transitions of Care (Post Inpatient/ED visit) call.   Susa Loffler , BSN, RN [facility]   VBCI-Population Health RN Care Manager Direct Dial [phone]  Fax: [phone] Website: [email or web address]

## 2024-03-19 ENCOUNTER — Telehealth: Payer: Self-pay

## 2024-03-19 NOTE — Transitions of Care (Post Inpatient/ED Visit) (Signed)
 03/19/2024  Name: Austin [name] MRN: [id number] DOB: 1956/09/05  Today's TOC FU Call Status: Today's TOC FU Call Status:: Successful TOC FU Call Completed Unsuccessful Call (1st Attempt) Date: 03/17/24 Unsuccessful Call (2nd Attempt) Date: 03/18/24 [facility] FU Call Complete Date: 03/19/24 Patient's Name and Date of Birth confirmed.  Transition Care Management Follow-up Telephone Call Date of Discharge: 03/16/24 Discharge Facility: [facility]) Type of Discharge: Inpatient Admission Primary Inpatient Discharge Diagnosis:: Acute on chronic systolic CHF (congestive heart failure) How have you been since you were released from the hospital?: Better Any questions or concerns?: Yes Patient Questions/Concerns:: thought he was released to soon  Items Reviewed: Did you receive and understand the discharge instructions provided?: No Medications obtained,verified, and reconciled?: No Medications Not Reviewed Reasons:: Other: (He declined individual review stating he had all his medicine and Dede from [facility] had already came out and set them up for him) Any new allergies since your discharge?: No Dietary orders reviewed?: Yes Type of Diet Ordered:: Reg Heart Healthy Do you have support at home?: Yes People in Home [RPT]: spouse Name of Support/Comfort Primary Source: Wife [name], Sons [name], [name]  Medications Reviewed Today: Medications Reviewed Today   Medications were not reviewed in this encounter     Home Care and Equipment/Supplies: Were Home Health Services Ordered?: No Any new equipment or medical supplies ordered?: No  Functional Questionnaire: Do you need assistance with bathing/showering or dressing?: No Do you need assistance with meal preparation?: No Do you need assistance with eating?: No Do you have difficulty maintaining continence: No Do you need assistance with getting out of bed/getting out of a chair/moving?: No Do you have difficulty managing or  taking your medications?: Yes (Pill boxes set up by [facility])  Follow up appointments reviewed: PCP Follow-up appointment confirmed?: Yes Date of PCP follow-up appointment?: 03/18/24 Follow-up Provider: Busy - Default status  [name], FNP Specialist Hospital Follow-up appointment confirmed?: Yes Date of Specialist follow-up appointment?: 03/26/24 Follow-Up Specialty Provider:: Cardiology Do you need transportation to your follow-up appointment?: No Do you understand care options if your condition(s) worsen?: Yes-patient verbalized understanding  Benefits reviewed  Not reviewed He declined   Reviewed goals for care  Patient / Caregiver was encouraged to make informed decisions about their care, actively participate in managing their health condition, and implement lifestyle changes as needed to promote independence and self-management of health care. There were no reported  barriers to care. He stated he was fine and declined further review or additional follow-up   TOC program  Patient is at  risk for readmission and / or has history of  high utilization  Discussed VBCI  TOC program and weekly calls to patient to assess condition/status, medication management  and provide support/education as indicated . Patient  and / or [facility] voiced understanding and declined enrollment in the 30-day TOC Program at this time . He was brief on call stated he had all his medication declined to review full med list. He declined assessments and completing the [facility] call , he declined offer for a call back or additional follow-up at a later time He stated " no I'm fine"   said Thank you" and hung up.    Follow-up Plan 1.Stated he was seem by PA 4/10 and has to go back next week.  He has Cardiology follow-up 4/18  Enc. him to Take all medications and your discharge paperwork with him  for his next visit with your Primary MD-   Be sure  to request your Primary MD to go over all hospital tests and  procedure/radiological results at the follow up appointment ,     Patient was encouraged to Contact PCP with any questions or concerns regarding ongoing medical care, any difficulty obtaining or picking up prescriptions, any changes or worsening in condition including signs / symptoms not relieved  with interventions Patient had no additional questions or concerns at this time.   The patient has been provided with contact information for the care management team and has been advised to call with any health-related questions or concerns. Follow up with their  PCP, Specialists or  additional Healthcare Providers as scheduled,  sooner if concerns arise    Susa Loffler , BSN, RN [facility]   VBCI-Population Health RN Care Manager Direct Dial [phone]  Fax: [phone] Website: [email or web address]

## 2024-03-22 ENCOUNTER — Other Ambulatory Visit (HOSPITAL_COMMUNITY)

## 2024-03-22 ENCOUNTER — Telehealth (HOSPITAL_COMMUNITY): Payer: Self-pay | Admitting: Emergency Medicine

## 2024-03-22 NOTE — Telephone Encounter (Signed)
 Called and spoke w/ Ms. Ronan to follow up on how Mr. Binsfeld is doing with his Zoll monitoring device.  She states he is not doing well.  He took it off on Sunday to charge per instructions and then never put it back on.  Set up home visit for 4/15 @ 9:15 to re-educate pt with Zoll monitor device and reconcile meds.    Ermalinda Hays, EMT-Paramedic [phone] 03/22/2024

## 2024-03-23 ENCOUNTER — Telehealth: Payer: Self-pay

## 2024-03-23 ENCOUNTER — Other Ambulatory Visit (HOSPITAL_COMMUNITY): Payer: Self-pay | Admitting: Emergency Medicine

## 2024-03-23 NOTE — Patient Outreach (Signed)
  Care Coordination   Visit Note   03/23/2024 Name: ZYEIR DYMEK MRN: [id number] DOB: 1956-03-31  QASIM DIVELEY is a 68 y.o. year old male who sees Susanna Epley, FNP for primary care. I spoke with  Zerita Hill  and DeDe Felipe Horton by phone today.    Follow up plan: No further intervention required.   Encounter Outcome:  Patient Visit Completed Patient was previously called and declined Program Spoke with Ms Felipe Horton who knows the patient well. She assists him with Med Management and Case management She is aware he declined Program but stated if there are future needs or he is re eligible for the Program , she is happy to facilitate communication and engaging the patient  She can be reached at [phone]  James Mcardle , BSN, RN [facility]   VBCI-Population Health RN Care Manager Direct Dial [phone]  Fax: [phone] Website: [email or web address]

## 2024-03-23 NOTE — Progress Notes (Signed)
 Paramedicine Encounter    Patient ID: Austin [name], male    DOB: 11/03/1956, 68 y.o.   MRN: [id number]   Complaints NONE  Assessment A&O x 4, skin W&D w/ good color.  Pt denies chest pain or SOB.  Lung sounds clear bilat and no peripheral edema noted. Odor of ETOH present on pt's breath  Compliance with meds No (took 5 out of seven doses in a.m. and 2 out of 7 p.m. doses)  Pill box filled x 1 week  Refills needed NONE  Meds changes since last visit NONE    Social changes NONE   BP 112/80 (BP Location: Left Arm, Patient Position: Sitting, Cuff Size: Normal)   Pulse (!) 104   Resp 18   Wt 153 lb 6.4 oz (69.6 kg)   SpO2 98%   BMI 22.65 kg/m  Weight yesterday- not taken Last visit weight-150lb  ATF Mr. Bergsma A&O x 4, skin W&D w/ good color. Pt denies chest pain or SOB. Lung sounds clear throughout and no peripheral edema noted.  At last home visit, I assisted pt applying his Zoll monitoring device. His wife was present and participated in that visit.  Pt's wife was able to remove the monitor on Sunday for charging as instructed.  She states that he would not put it back on.  Today, I removed the old sticker and placed back onto pt's left mid axillary region under his left arm.  I demonstrated this to his wife once again and explained that the [employer] device must accompany pt at all times. Reinforced that the monitor device is safe to wear in the shower but should not be submerged under water like in a tub bath.  Pt and wife understand same.   Pt once again has not been compliant with his meds.  I continue to reinforce the importance of med compliance and cessation of alcohol at each and every home visit. Upcoming appointments for 4/18 w/ Internal Med and 4/18 at Heart and Vascular clinic reviewed w/ pt.   I advised him that I would attend his clinic visit on Friday and to be sure to bring his pill box and medications and he advised he would do so.  ACTION: Home visit  completed  Carlton Chick [phone] 03/23/24  Patient Care Team: Susanna Epley, FNP as PCP - General (General Practice) Wendie Hamburg, MD as PCP - Cardiology (Cardiology)  Patient Active Problem List   Diagnosis Date Noted   Hyponatremia 03/18/2024   Acute on chronic systolic CHF (congestive heart failure) (HCC) 03/11/2024   Abnormal levels of other serum enzymes 03/11/2024   Anemia 03/11/2024   Rectal bleeding 03/11/2024   ABLA (acute blood loss anemia) 01/13/2024   Adrenal insufficiency (HCC) 01/12/2024   Closed left hip fracture (HCC) 01/10/2024   Transient hypotension 06/05/2023   Alcohol use disorder 06/05/2023   Hypertensive 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 (HCC) 06/05/2023   Elevated brain natriuretic peptide (BNP) level 06/05/2023   Elevated LFTs 05/18/2023   Prolonged QT interval 05/18/2023   AKI (acute kidney injury) (HCC) 05/17/2023   Iron deficiency anemia 09/03/2020   NICM (nonischemic cardiomyopathy) (HCC)    ETOH abuse 11/10/2018   Essential hypertension 11/10/2018   Blood in stool 11/10/2018    Current Outpatient Medications:    digoxin (LANOXIN) 0.125 MG tablet, Take 1 tablet (0.125 mg total) by mouth daily., Disp: 30 tablet, Rfl: 0   empagliflozin (JARDIANCE) 10 MG TABS tablet,  Take 1 tablet (10 mg total) by mouth daily before breakfast., Disp: 90 tablet, Rfl: 3   ferrous sulfate 325 (65 FE) MG tablet, Take 1 tablet (325 mg total) by mouth daily with breakfast., Disp: 30 tablet, Rfl: 3   furosemide (LASIX) 40 MG tablet, Take 1.5 tablets (60 mg total) by mouth daily., Disp: 90 tablet, Rfl: 3   hydrocortisone (CORTEF) 5 MG tablet, Take 1 tablet (5 mg total) by mouth 2 (two) times daily for 15 days., Disp: 30 tablet, Rfl: 0   losartan (COZAAR) 25 MG tablet, Take 1 tablet (25 mg total) by mouth daily., Disp: 30 tablet, Rfl: 0   docusate sodium (COLACE) 100 MG capsule, Take 1 capsule (100  mg total) by mouth 2 (two) times daily. (Patient not taking: Reported on 03/23/2024), Disp: 10 capsule, Rfl: 0   loperamide (IMODIUM) 2 MG capsule, Take 2 mg by mouth every 6 (six) hours as needed for diarrhea or loose stools. (Patient not taking: Reported on 03/23/2024), Disp: , Rfl:    Multiple Vitamin (MULTIVITAMIN) tablet, Take 1 tablet by mouth daily. (Patient not taking: Reported on 03/17/2024), Disp: , Rfl:    senna (SENOKOT) 8.6 MG TABS tablet, Take 1 tablet (8.6 mg total) by mouth 2 (two) times daily. (Patient not taking: Reported on 03/17/2024), Disp: 120 tablet, Rfl: 0 Allergies  Allergen Reactions   Latex Swelling and Rash     Social History   Socioeconomic History   Marital status: Married    Spouse name: Not on file   Number of children: 2   Years of education: Not on file   Highest education level: Bachelor's degree (e.g., BA, AB, BS)  Occupational History   Not on file  Tobacco Use   Smoking status: Never   Smokeless tobacco: Never  Vaping Use   Vaping status: Never Used  Substance and Sexual Activity   Alcohol use: Not Currently    Comment: 1-3 beers daily   Drug use: No   Sexual activity: Yes  Other Topics Concern   Not on file  Social History Narrative   Not on file   Social Drivers of Health   Financial Resource Strain: Low Risk  (02/11/2024)   Overall Financial Resource Strain (CARDIA)    Difficulty of Paying Living Expenses: Not hard at all  Food Insecurity: No Food Insecurity (03/11/2024)   Hunger Vital Sign    Worried About Running Out of Food in the Last Year: Never true    Ran Out of Food in the Last Year: Never true  Transportation Needs: No Transportation Needs (03/11/2024)   PRAPARE - Administrator, Civil Service (Medical): No    Lack of Transportation (Non-Medical): No  Physical Activity: Insufficiently Active (02/11/2024)   Exercise Vital Sign    Days of Exercise per Week: 2 days    Minutes of Exercise per Session: 40 min  Stress: No  Stress Concern Present (02/11/2024)   [employer] of Occupational Health - Occupational Stress Questionnaire    Feeling of Stress : Not at all  Social Connections: Socially Integrated (03/11/2024)   Social Connection and Isolation Panel [NHANES]    Frequency of Communication with Friends and Family: Never    Frequency of Social Gatherings with Friends and Family: More than three times a week    Attends Religious Services: More than 4 times per year    Active Member of [employer] or Organizations: Yes    Attends Banker Meetings: More than 4 times  per year    Marital Status: Married  Catering manager Violence: Not At Risk (03/11/2024)   Humiliation, Afraid, Rape, and Kick questionnaire    Fear of Current or Ex-Partner: No    Emotionally Abused: No    Physically Abused: No    Sexually Abused: No    Physical Exam      Future Appointments  Date Time Provider Department Center  03/25/2024 11:20 AM [facility] None  03/26/2024 11:00 AM [facility] PA/NP [facility] None  04/08/2024  9:30 AM [facility] PA/NP [facility] None  03/16/2025  2:40 PM [facility]-ANNUAL WELLNESS VISIT [facility] None

## 2024-03-25 ENCOUNTER — Telehealth (HOSPITAL_COMMUNITY): Payer: Self-pay

## 2024-03-25 ENCOUNTER — Other Ambulatory Visit

## 2024-03-25 DIAGNOSIS — Z471 Aftercare following joint replacement surgery: Secondary | ICD-10-CM | POA: Diagnosis not present

## 2024-03-25 DIAGNOSIS — Z96642 Presence of left artificial hip joint: Secondary | ICD-10-CM | POA: Diagnosis not present

## 2024-03-25 NOTE — Telephone Encounter (Signed)
 Called to confirm/remind patient of their appointment at [facility] on 03/26/2024 11:00.   Appointment:   [] Confirmed  [x] Left mess   [] No answer/No voice mail  [] Phone not in service  Patient reminded to bring all medications and/or complete list.  Confirmed patient has transportation. Gave directions, instructed to utilize valet parking.

## 2024-03-26 ENCOUNTER — Ambulatory Visit (HOSPITAL_COMMUNITY)
Admit: 2024-03-26 | Discharge: 2024-03-26 | Disposition: A | Discharge status: Home / Self Care | Attending: Physician Assistant | Admitting: Physician Assistant

## 2024-03-26 ENCOUNTER — Other Ambulatory Visit (HOSPITAL_COMMUNITY): Payer: Self-pay | Admitting: Emergency Medicine

## 2024-03-26 VITALS — BP 100/70 | HR 92 | Ht 69.0 in | Wt 150.2 lb

## 2024-03-26 DIAGNOSIS — I5022 Chronic systolic (congestive) heart failure: Secondary | ICD-10-CM | POA: Diagnosis not present

## 2024-03-26 DIAGNOSIS — I447 Left bundle-branch block, unspecified: Secondary | ICD-10-CM | POA: Insufficient documentation

## 2024-03-26 DIAGNOSIS — I110 Hypertensive heart disease with heart failure: Secondary | ICD-10-CM | POA: Insufficient documentation

## 2024-03-26 DIAGNOSIS — Z79899 Other long term (current) drug therapy: Secondary | ICD-10-CM | POA: Diagnosis not present

## 2024-03-26 DIAGNOSIS — F1010 Alcohol abuse, uncomplicated: Secondary | ICD-10-CM | POA: Insufficient documentation

## 2024-03-26 DIAGNOSIS — K7460 Unspecified cirrhosis of liver: Secondary | ICD-10-CM | POA: Insufficient documentation

## 2024-03-26 DIAGNOSIS — Z7984 Long term (current) use of oral hypoglycemic drugs: Secondary | ICD-10-CM | POA: Diagnosis not present

## 2024-03-26 DIAGNOSIS — E2740 Unspecified adrenocortical insufficiency: Secondary | ICD-10-CM | POA: Diagnosis not present

## 2024-03-26 DIAGNOSIS — Z91148 Patient's other noncompliance with medication regimen for other reason: Secondary | ICD-10-CM | POA: Insufficient documentation

## 2024-03-26 DIAGNOSIS — E871 Hypo-osmolality and hyponatremia: Secondary | ICD-10-CM | POA: Diagnosis not present

## 2024-03-26 DIAGNOSIS — I428 Other cardiomyopathies: Secondary | ICD-10-CM | POA: Diagnosis not present

## 2024-03-26 LAB — COMPREHENSIVE METABOLIC PANEL WITH GFR
ALT: 18 U/L (ref 0–44)
AST: 28 U/L (ref 15–41)
Albumin: 3.3 g/dL — ABNORMAL LOW (ref 3.5–5.0)
Alkaline Phosphatase: 82 U/L (ref 38–126)
Anion gap: 12 (ref 5–15)
BUN: 12 mg/dL (ref 8–23)
CO2: 22 mmol/L (ref 22–32)
Calcium: 8.3 mg/dL — ABNORMAL LOW (ref 8.9–10.3)
Chloride: 99 mmol/L (ref 98–111)
Creatinine, Ser: 0.96 mg/dL (ref 0.61–1.24)
GFR, Estimated: >60 mL/min (ref >60)
Glucose, Bld: 86 mg/dL (ref 70–99)
Potassium: 3.3 mmol/L — ABNORMAL LOW (ref 3.5–5.1)
Sodium: 133 mmol/L — ABNORMAL LOW (ref 135–145)
Total Bilirubin: 0.6 mg/dL (ref 0.0–1.2)
Total Protein: 7.6 g/dL (ref 6.5–8.1)

## 2024-03-26 LAB — DIGOXIN LEVEL: Digoxin Level: 0.5 ng/mL — ABNORMAL LOW (ref 0.8–2.0)

## 2024-03-26 LAB — BRAIN NATRIURETIC PEPTIDE: B Natriuretic Peptide: 779.1 pg/mL — ABNORMAL HIGH (ref 0.0–100.0)

## 2024-03-26 MED ORDER — SPIRONOLACTONE 25 MG PO TABS: 12.5000 mg | ORAL_TABLET | Freq: Every day | ORAL | 3 refills | Status: DC

## 2024-03-26 MED ORDER — DIGOXIN 125 MCG PO TABS: 0.1250 mg | ORAL_TABLET | Freq: Every day | ORAL | 3 refills | Status: AC

## 2024-03-26 MED ORDER — LOSARTAN POTASSIUM 25 MG PO TABS: 25.0000 mg | ORAL_TABLET | Freq: Every day | ORAL | 3 refills | Status: AC

## 2024-03-26 NOTE — Progress Notes (Addendum)
 ADVANCED HF CLINIC NOTE   PCP: Susanna Epley, FNP Cardiology: Dr. Alda Amas HF Cardiology: Dr. Mitzie Anda  Reason for Visit: Heart Failure Follow-up HPI: 68 y.o. with history of ETOH abuse HTN, chronic systolic CHF. nonischemic cardiomyopathy.   HF dates back to 2021. Echo 3/21 EF < 20% with severe RV dysfunction.  RHC/LHC in 3/21 showed no CAD but elevated R > L filling pressures, preserved cardiac index.  Cardiac MRI 4/21: LVEF 14%, RVEF 16%, no LGE  EF had improved to 50-55% in 5/23.  Echo (8/24): EF 25-30%, GIDD, RV normal. Suspected drop in EF 2/2 med noncompliance and ETOH.   Echo (12/24): 25-30%  Seen by EP  1/25 CRT-D evaluation. EP felt that there might not be benefit of CRT-D. Did appear that LBBB is rate dependent, may better benefit from reduced rate.   He was readmitted 2/1-01/15/24 with fall resulting in left hip fracture s/p left hip arthroplasty. Course c/b anemia and hypotension. Workup c/w adrenal insufficiency. Discharged home on hydrocortisone . Entresto , Toprol  XL and spironolactone  were discontinued.  Seen in clinic 03/11/24 and sent to ED for admission d/t concern for low-output CHF. Had missed some medications d/t caring for his wife after a recent surgery.  Lactic acid elevated at 4.7. He diuresed with milrinone  support. GDMT titrated. He was enrolled in [employer] HFMS prior to discharge.  Here today for post hospital CHF follow-up. Has not been compliant with medications. Paramedic, Dede, is present and assists with arranging weekly pillboxes. Continues to consume ETOH. Feeling well. No dyspnea, orthopnea, PND or lower extremity edema. His wife states he seems to be feeling better after discharge. Has been more active. Plans to mow his lawn with new mower this weekend.   FH: Sister with CHF, brother with pacemaker, mother with CHF.   SH: Married, lives in [place], heavy ETOH in the past has now cut back, no drugs.  He was a Estate agent.   ROS: All systems  reviewed and negative except as per HPI.   Current Outpatient Medications  Medication Sig Dispense Refill   empagliflozin  (JARDIANCE ) 10 MG TABS tablet Take 1 tablet (10 mg total) by mouth daily before breakfast. 90 tablet 3   ferrous sulfate  325 (65 FE) MG tablet Take 1 tablet (325 mg total) by mouth daily with breakfast. 30 tablet 3   furosemide  (LASIX ) 40 MG tablet Take 1.5 tablets (60 mg total) by mouth daily. 90 tablet 3   hydrocortisone  (CORTEF ) 5 MG tablet Take 1 tablet (5 mg total) by mouth 2 (two) times daily for 15 days. 30 tablet 0   loperamide (IMODIUM) 2 MG capsule Take 2 mg by mouth every 6 (six) hours as needed for diarrhea or loose stools.     Multiple Vitamin (MULTIVITAMIN) tablet Take 1 tablet by mouth daily.     spironolactone  (ALDACTONE ) 25 MG tablet Take 0.5 tablets (12.5 mg total) by mouth daily. 45 tablet 3   digoxin  (LANOXIN ) 0.125 MG tablet Take 1 tablet (0.125 mg total) by mouth daily. 90 tablet 3   docusate sodium  (COLACE) 100 MG capsule Take 1 capsule (100 mg total) by mouth 2 (two) times daily. (Patient not taking: Reported on 03/23/2024) 10 capsule 0   losartan  (COZAAR ) 25 MG tablet Take 1 tablet (25 mg total) by mouth daily. 90 tablet 3   senna (SENOKOT) 8.6 MG TABS tablet Take 1 tablet (8.6 mg total) by mouth 2 (two) times daily. (Patient not taking: Reported on 03/17/2024) 120 tablet 0   No current  facility-administered medications for this encounter.   Wt Readings from Last 3 Encounters:  03/26/24 68.1 kg (150 lb 3.2 oz)  03/23/24 69.6 kg (153 lb 6.4 oz)  03/18/24 68 kg (150 lb)   BP 100/70   Pulse 92   Ht 5\' 9"  (1.753 m)   Wt 68.1 kg (150 lb 3.2 oz)   SpO2 99%   BMI 22.18 kg/m   Physical Exam General:  Thin, appears older than stated age Neck: JVP 6-7 Cor: Regular rate & rhythm. No rubs, gallops or murmurs. Lungs: clear Abdomen: soft, nontender, nondistended. Extremities: no edema Neuro: alert & orientedx3. Affect pleasant  ECG: SR 91 bpm, LBBB  with QRS 152 ms  Assessment/Plan:  1. Chronic systolic CHF: NICM, possibly due to ETOH.  Echo 3/21 EF 20%, moderately decreased RV.  RHC/LHC 3/21 no significant coronary disease, preserved cardiac output, R>L heart failure.  Cardiac MRI in 4/21 showed EF 14%, severe RV dysfunction. No LGE but difficult LGE images, cannot rule out prior myocarditis.  TEE in 5/23 showed EF 50-55%, normal RV, trivial MR, mobile RA structure seen on TTE was [facility]. Echo in 8/24 with drop in EF to 25-30% in setting of heavy ETOH intake and noncompliance with meds. Echo 12/24 EF 25-30% on GDMT. Echo 4/25 EF 20-25%, LV with GHK, RV mod reduced. He has cut back some on ETOH but is still drinking. Recent admission for low-output HF requiring inotrope support. - NYHA II/early III. Suspect downplaying symptoms. - Volume looks good on exam and on personal review of [employer] HFMS data from 04/09-today. - No beta blocker with w/ recent shock - Continue losartan  25 mg daily - Continue digoxin  0.125 mg daily - Continue jardiance  10 mg daily - Start spiro 12.5 mg daily - Needs better compliance with medications. Appreciate paramedicine. - CMET/BNP/digoxin  level today, repeat BMET 1 week - Seen by Dr. Rodolfo Clan for CRT-D. Not sure that with age whether ICD would be beneficial for mortality. It was thought that LBBB was more rate dependent and would benefit from better rate control. Beta blocker not currently a good choice for him. In any case, has not been compliant. - Would likely not be a candidate for VAD with RV dysfunction. Not transplant candidate d/t ETOH abuse.   2. LBBB:  - Repeat Echo 12/24: 25-30% after compliance with medications and cutting back on ETOH. - See above CRT-D discussion   3. ETOH abuse: Long history of heavy ETOH. This may be the cause of his cardiomyopathy.  Continues to consume ETOH regularly. Discussed cessation again today   4. Hyponatremia:  - Chronic. Typically upper 120s-130s - Likely d/t  liver disease +/- CHF and ETOH. - Evidence of cirrhosis noted on US  in 2021.  - Labs today   5. Adrenal insufficiency: Low am cortisol during recent admit in February. He was placed on hydrocortisone  - Has been referred to Endocrine  Follow up 3-4 weeks with APP  [facility], Jalissa Heinzelman N, PA-C 03/26/2024

## 2024-03-26 NOTE — Progress Notes (Signed)
 Had to visit pt at home for med rec because he did not bring his meds and pill box with him to appointment. Pill box reconciled x 1 week.  Reflects addition of Spironolactone  12.5 mg daily. Reminded pt to keep his Zoll Gateway device with him when he goes out of the house.  He advises he understands same.    Mary Sharps, EMT-Paramedic [phone] 03/26/2024

## 2024-03-26 NOTE — Progress Notes (Signed)
 Paramedicine Encounter   Patient ID: Austin [name] , male,   DOB: 04/12/1956,67 y.o.,  MRN: [id number]   Met patient in clinic today with provider.   [id number]@ clinic- 150lb B/P- 100/70 P-92 SP02- 99   Med changes today (if any) : add back Spiro 12.5mg . daily   Ermalinda Hays, EMT-Paramedic [phone] 03/26/2024

## 2024-03-26 NOTE — Patient Instructions (Signed)
 Start Spiro 12.5 mg (1/2 tablet) daily. Rx sent. Repeat lab next week - see below. Refills sent for digoxin  and losartan  as requested. Labs today - will call you if abnormal. Return on your scheduled appointment in Heart Failure - see below.

## 2024-03-28 NOTE — Progress Notes (Incomplete)
 I,Austin [name], CMA,acting as a Neurosurgeon for [employer], NP.,have documented all relevant documentation on the behalf of Austin Spry, NP,as directed by  Austin Spry, NP while in the presence of Austin Spry, NP.  Subjective:  Patient ID: Austin [name] , male    DOB: [birth year] , 68 y.o.   MRN: [id number]  No chief complaint on file.   HPI  Patient is a 68 year old male with medical history of Congestive heart failure, alcohol  abuse, presents today for hospital follow up. Patient was admitted on 03/11/2024 and discharged on Mr. Austin [name] was admitted to the hospital with the working diagnosis of acute on chronic heart failure exacerbation.    68 yo male with the  who presented with dyspnea.  Recent hospitalization 02/01 to 01/15/24 for left hip fracture, with post op hypotension in the setting of adrenal insufficiency.  04/03 outpatient follow up with heart failure clinic, he was found hypervolemic and blood work was obtained. Serum sodium was found to be low and he was advised to come to the ED.  On his initial physical examination his blood pressure was 130/93, HR 98, RR 18 and 02 saturation 100%.  Lungs with no wheezing or rhonchi, positive rales, heart with S1 and S2 present and regular with no gallops or rubs, abdomen with no distention and no lower extremity edema.    Na 120, K 4,0 CL 87, bicarbonate 14, glucose 105 bun 13 cr 1,47  BNP > 4,500  Wbc 10,1 hgb 11,8 plt 221    Chest radiograph with hypoinflation, right base atelectasis, bilateral hilar vascular congestion with bilateral pleural effusions, positive cardiomegaly.    EKG 110, left axis deviation, qtc 538, left bundle branch block, with sinus rhythm with no significant ST segment or T wave changes. Positive LVH.    Patient was placed on furosemide  IV and milrinone     04/05 volume status is improving.  04/06 improving hemodynamics, continue on inotropic therapy.  04/07 off milrinone .  04/08 patient continue to be stable  off inotropic support, plan to discharge home today with follow up as outpatient. Patient will be enrolled in [facility] HFMS prior to his discharge.    03/11/24 and discharged on 03/16/24.      Past Medical History:  Diagnosis Date  . Arthritis    hands  . CHF (congestive heart failure) (HCC)   . GERD (gastroesophageal reflux disease)   . Hypertension   . Hypomagnesemia   . Hyponatremia   . Prostate cancer [facility])      Family History  Problem Relation Age of Onset  . Hypertension Mother   . Hypertension Father      Current Outpatient Medications:  .  digoxin  (LANOXIN ) 0.125 MG tablet, Take 1 tablet (0.125 mg total) by mouth daily., Disp: 30 tablet, Rfl: 0 .  docusate sodium  (COLACE) 100 MG capsule, Take 1 capsule (100 mg total) by mouth 2 (two) times daily. (Patient not taking: Reported on 03/17/2024), Disp: 10 capsule, Rfl: 0 .  empagliflozin  (JARDIANCE ) 10 MG TABS tablet, Take 1 tablet (10 mg total) by mouth daily before breakfast., Disp: 90 tablet, Rfl: 3 .  ferrous sulfate  325 (65 FE) MG tablet, Take 1 tablet (325 mg total) by mouth daily with breakfast., Disp: 30 tablet, Rfl: 3 .  furosemide  (LASIX ) 40 MG tablet, Take 1.5 tablets (60 mg total) by mouth daily., Disp: 90 tablet, Rfl: 3 .  hydrocortisone  (CORTEF ) 5 MG tablet, Take 1 tablet (5 mg total) by mouth 2 (two)  times daily for 15 days., Disp: 30 tablet, Rfl: 0 .  loperamide (IMODIUM) 2 MG capsule, Take 2 mg by mouth every 6 (six) hours as needed for diarrhea or loose stools., Disp: , Rfl:  .  losartan  (COZAAR ) 25 MG tablet, Take 1 tablet (25 mg total) by mouth daily., Disp: 30 tablet, Rfl: 0 .  Multiple Vitamin (MULTIVITAMIN) tablet, Take 1 tablet by mouth daily. (Patient not taking: Reported on 03/17/2024), Disp: , Rfl:  .  senna (SENOKOT) 8.6 MG TABS tablet, Take 1 tablet (8.6 mg total) by mouth 2 (two) times daily. (Patient not taking: Reported on 03/17/2024), Disp: 120 tablet, Rfl: 0   Allergies  Allergen Reactions  . Latex  Swelling and Rash     Review of Systems   There were no vitals filed for this visit. There is no height or weight on file to calculate BMI.  Wt Readings from Last 3 Encounters:  03/16/24 132 lb 4.4 oz (60 kg)  03/11/24 156 lb (70.8 kg)  03/02/24 151 lb 9.6 oz (68.8 kg)    The ASCVD Risk score (Arnett DK, et al., 2019) failed to calculate for the following reasons:   The valid total cholesterol range is 130 to 320 mg/dL  Objective:  Physical Exam      Assessment And Plan:  There are no diagnoses linked to this encounter.  No follow-ups on file.  Patient was given opportunity to ask questions. Patient verbalized understanding of the plan and was able to repeat key elements of the plan. All questions were answered to their satisfaction.    I, Austin Spry, NP, have reviewed all documentation for this visit. The documentation on 03/18/24 for the exam, diagnosis, procedures, and orders are all accurate and complete.   IF YOU HAVE BEEN REFERRED TO A SPECIALIST, IT MAY TAKE 1-2 WEEKS TO SCHEDULE/PROCESS THE REFERRAL. IF YOU HAVE NOT HEARD FROM US /SPECIALIST IN TWO WEEKS, PLEASE GIVE US  A CALL AT [phone].

## 2024-03-29 ENCOUNTER — Encounter (HOSPITAL_COMMUNITY)

## 2024-03-29 NOTE — Assessment & Plan Note (Signed)
 Recheck labs in 1 week

## 2024-03-29 NOTE — Assessment & Plan Note (Deleted)
 BNP > 4500, on admission , was on Furosemide  IV.  now Furosemide  60 mg by mouth daily.

## 2024-03-29 NOTE — Assessment & Plan Note (Signed)
 BNP > 4500, on admission , was on Furosemide  IV.  now Furosemide  60 mg by mouth daily.

## 2024-03-29 NOTE — Assessment & Plan Note (Signed)
 Has not had any drink since hospitalization, no signs of withdrawal noted

## 2024-03-29 NOTE — Assessment & Plan Note (Signed)
 Continue Hydrocortisone  5 mg twice daily

## 2024-04-01 ENCOUNTER — Other Ambulatory Visit (HOSPITAL_COMMUNITY): Payer: Self-pay | Admitting: Emergency Medicine

## 2024-04-01 ENCOUNTER — Other Ambulatory Visit: Payer: Self-pay | Admitting: Family Medicine

## 2024-04-01 MED ORDER — HYDROCORTISONE 5 MG PO TABS: 5.0000 mg | ORAL_TABLET | Freq: Every day | ORAL | 5 refills | Status: AC

## 2024-04-01 MED ORDER — HYDROCORTISONE 5 MG PO TABS: 5.0000 mg | ORAL_TABLET | Freq: Two times a day (BID) | ORAL | 0 refills | Status: AC

## 2024-04-01 NOTE — Progress Notes (Signed)
 Paramedicine Encounter    Patient ID: Austin [name], male    DOB: Apr 25, 1956, 68 y.o.   MRN: [id number]   Complaints NONE  Assessment A&O x 4, skin W&D w/ good color.    Compliance with meds missed 1 dose in a.m. and 2 in p.m.  Pill box filled x 1 week  Refills needed Digoxin , Hydrocortisone   Meds changes since last visit NONE    Social changes NONE   BP 120/80 (BP Location: Left Arm, Patient Position: Standing, Cuff Size: Normal)   Pulse 97   Resp 16   Wt 143 lb (64.9 kg)   SpO2 97%   BMI 21.12 kg/m  Weight yesterday- not taken  Last visit weight-150lb  Austin [name] A&O x 4, skin W&D w/ good color.  Pt w/ odor of ETOH on his breath.  He denies chest pain or SOB.  Lung sounds clear bilat and no peripheral edema noted.  Med box reconciled x 1 week.   Zoll patch changed.  Warm washcloth placed over old patch for ease of removal.  Site cleaned w/ site preps.  Placed sensor on charger and pt advised he would reattach device once it was fully charged.  Reminded pt that the gateway must travel with him when he goes out.  He advised that the sensor had fallen off in the shower a couple days ago and that [employer] had called him to find out what happened.  No other issues with the [employer] monitoring device. Next home visit will be 4/30 @ 8:30.  ACTION: Home visit completed     Austin [name] [phone] 04/01/24  Patient Care Team: Susanna Epley, FNP as PCP - General (General Practice) Wendie Hamburg, MD as PCP - Cardiology (Cardiology)  Patient Active Problem List   Diagnosis Date Noted   Hyponatremia 03/18/2024   Acute on chronic systolic CHF (congestive heart failure) (HCC) 03/11/2024   Abnormal levels of other serum enzymes 03/11/2024   Anemia 03/11/2024   Rectal bleeding 03/11/2024   ABLA (acute blood loss anemia) 01/13/2024   Adrenal insufficiency (HCC) 01/12/2024   Closed left hip fracture (HCC) 01/10/2024   Transient hypotension 06/05/2023    Alcohol  use disorder 06/05/2023   Hypertensive 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 (HCC) 06/05/2023   Elevated brain natriuretic peptide (BNP) level 06/05/2023   Elevated LFTs 05/18/2023   Prolonged QT interval 05/18/2023   AKI (acute kidney injury) (HCC) 05/17/2023   Iron deficiency anemia 09/03/2020   NICM (nonischemic cardiomyopathy) (HCC)    ETOH abuse 11/10/2018   Essential hypertension 11/10/2018   Blood in stool 11/10/2018    Current Outpatient Medications:    digoxin  (LANOXIN ) 0.125 MG tablet, Take 1 tablet (0.125 mg total) by mouth daily., Disp: 90 tablet, Rfl: 3   empagliflozin  (JARDIANCE ) 10 MG TABS tablet, Take 1 tablet (10 mg total) by mouth daily before breakfast., Disp: 90 tablet, Rfl: 3   ferrous sulfate  325 (65 FE) MG tablet, Take 1 tablet (325 mg total) by mouth daily with breakfast., Disp: 30 tablet, Rfl: 3   furosemide  (LASIX ) 40 MG tablet, Take 1.5 tablets (60 mg total) by mouth daily., Disp: 90 tablet, Rfl: 3   loperamide (IMODIUM) 2 MG capsule, Take 2 mg by mouth every 6 (six) hours as needed for diarrhea or loose stools., Disp: , Rfl:    losartan  (COZAAR ) 25 MG tablet, Take 1 tablet (25 mg total) by mouth daily., Disp: 90 tablet, Rfl: 3  Multiple Vitamin (MULTIVITAMIN) tablet, Take 1 tablet by mouth daily., Disp: , Rfl:    spironolactone  (ALDACTONE ) 25 MG tablet, Take 0.5 tablets (12.5 mg total) by mouth daily., Disp: 45 tablet, Rfl: 3   docusate sodium  (COLACE) 100 MG capsule, Take 1 capsule (100 mg total) by mouth 2 (two) times daily. (Patient not taking: Reported on 03/23/2024), Disp: 10 capsule, Rfl: 0   senna (SENOKOT) 8.6 MG TABS tablet, Take 1 tablet (8.6 mg total) by mouth 2 (two) times daily. (Patient not taking: Reported on 03/17/2024), Disp: 120 tablet, Rfl: 0 Allergies  Allergen Reactions   Latex Swelling and Rash     Social History   Socioeconomic History   Marital status: Married    Spouse name: Not  on file   Number of children: 2   Years of education: Not on file   Highest education level: Bachelor's degree (e.g., BA, AB, BS)  Occupational History   Not on file  Tobacco Use   Smoking status: Never   Smokeless tobacco: Never  Vaping Use   Vaping status: Never Used  Substance and Sexual Activity   Alcohol  use: Not Currently    Comment: 1-3 beers daily   Drug use: No   Sexual activity: Yes  Other Topics Concern   Not on file  Social History Narrative   Not on file   Social Drivers of Health   Financial Resource Strain: Low Risk  (02/11/2024)   Overall Financial Resource Strain (CARDIA)    Difficulty of Paying Living Expenses: Not hard at all  Food Insecurity: No Food Insecurity (03/11/2024)   Hunger Vital Sign    Worried About Running Out of Food in the Last Year: Never true    Ran Out of Food in the Last Year: Never true  Transportation Needs: No Transportation Needs (03/11/2024)   PRAPARE - Administrator, Civil Service (Medical): No    Lack of Transportation (Non-Medical): No  Physical Activity: Insufficiently Active (02/11/2024)   Exercise Vital Sign    Days of Exercise per Week: 2 days    Minutes of Exercise per Session: 40 min  Stress: No Stress Concern Present (02/11/2024)   [employer] of Occupational Health - Occupational Stress Questionnaire    Feeling of Stress : Not at all  Social Connections: Socially Integrated (03/11/2024)   Social Connection and Isolation Panel [NHANES]    Frequency of Communication with Friends and Family: Never    Frequency of Social Gatherings with Friends and Family: More than three times a week    Attends Religious Services: More than 4 times per year    Active Member of Clubs or Organizations: Yes    Attends Banker Meetings: More than 4 times per year    Marital Status: Married  Catering manager Violence: Not At Risk (03/11/2024)   Humiliation, Afraid, Rape, and Kick questionnaire    Fear of Current or  Ex-Partner: No    Emotionally Abused: No    Physically Abused: No    Sexually Abused: No    Physical Exam      Future Appointments  Date Time Provider Department Center  04/02/2024 10:00 AM [facility] LAB [facility] None  04/16/2024  9:30 AM [facility] PA/NP [facility] None  03/16/2025  2:40 PM [facility]-ANNUAL WELLNESS VISIT [facility] None

## 2024-04-02 ENCOUNTER — Ambulatory Visit (HOSPITAL_COMMUNITY)
Admission: RE | Admit: 2024-04-02 | Discharge: 2024-04-02 | Disposition: A | Discharge status: Home / Self Care | Source: Ambulatory Visit | Attending: Cardiology | Admitting: Cardiology

## 2024-04-02 ENCOUNTER — Telehealth (HOSPITAL_COMMUNITY): Payer: Self-pay | Admitting: Pharmacy Technician

## 2024-04-02 DIAGNOSIS — I5022 Chronic systolic (congestive) heart failure: Secondary | ICD-10-CM | POA: Insufficient documentation

## 2024-04-02 LAB — BASIC METABOLIC PANEL WITH GFR
Anion gap: 13 (ref 5–15)
BUN: 15 mg/dL (ref 8–23)
CO2: 22 mmol/L (ref 22–32)
Calcium: 8.9 mg/dL (ref 8.9–10.3)
Chloride: 99 mmol/L (ref 98–111)
Creatinine, Ser: 1.03 mg/dL (ref 0.61–1.24)
GFR, Estimated: >60 mL/min (ref >60)
Glucose, Bld: 110 mg/dL — ABNORMAL HIGH (ref 70–99)
Potassium: 3.8 mmol/L (ref 3.5–5.1)
Sodium: 134 mmol/L — ABNORMAL LOW (ref 135–145)

## 2024-04-02 NOTE — Telephone Encounter (Signed)
 Advanced Heart Failure Patient Advocate Encounter  The patient was approved for a [employer] grant that will help cover the cost of Jardiance , Losartan  and Spironolactone . Total amount awarded, $10,000. Eligibility, 03/03/24 - 03/02/25.  ID [id number]  BIN [id number]  PCN [id number]  Group [id number]  Called pharmacy, provided billing information. Called and updated patient. He will receive copy in the mail.  Correne Dillon, CPhT

## 2024-04-07 ENCOUNTER — Other Ambulatory Visit (HOSPITAL_COMMUNITY): Payer: Self-pay | Admitting: Emergency Medicine

## 2024-04-07 NOTE — Progress Notes (Signed)
 Paramedicine Encounter    Patient ID: Austin [name], male    DOB: [birth year], 68 y.o.   MRN: [id number]   Complaints NONE  Assessment A&O x 4, skin W&D w/ good color.  Denies chest pain or SOB.  Lung sounds clear bilat and no peripheral edema noted. Odor of ETOH on breath. Wearing Zoll monitor and fresh patch applied to left mid-axillary area.  Compliance with meds No   Pill box filled x 1 week  Refills needed NONE  Meds changes since last visit NONE    Social changes NONE   BP (!) 100/0 (Patient Position: Sitting, Cuff Size: Normal) Comment: palpated  Pulse 90   Resp 16   SpO2 100%  Weight yesterday-Not taken Last visit weight-143lb  Austin [name] has improved his med compliance but is not 100%.  He was also obviously drinking alcohol  which is baseline for him at my morning visits.  He denies chest pain or SOB.  Lung sounds are clear bilat.  Encouraged him to take his meds regularly and to work on not drinking alcohol .  ACTION: Home visit completed  Carlton Chick [phone] 04/07/24  Patient Care Team: Susanna Epley, FNP as PCP - General (General Practice) Wendie Hamburg, MD as PCP - Cardiology (Cardiology)  Patient Active Problem List   Diagnosis Date Noted   Hyponatremia 03/18/2024   Acute on chronic systolic CHF (congestive heart failure) (HCC) 03/11/2024   Abnormal levels of other serum enzymes 03/11/2024   Anemia 03/11/2024   Rectal bleeding 03/11/2024   ABLA (acute blood loss anemia) 01/13/2024   Adrenal insufficiency (HCC) 01/12/2024   Closed left hip fracture (HCC) 01/10/2024   Transient hypotension 06/05/2023   Alcohol  use disorder 06/05/2023   Hypertensive 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 (HCC) 06/05/2023   Elevated brain natriuretic peptide (BNP) level 06/05/2023   Elevated LFTs 05/18/2023   Prolonged QT interval 05/18/2023   AKI (acute kidney injury) (HCC) 05/17/2023   Iron  deficiency anemia 09/03/2020   NICM (nonischemic cardiomyopathy) (HCC)    ETOH abuse 11/10/2018   Essential hypertension 11/10/2018   Blood in stool 11/10/2018    Current Outpatient Medications:    digoxin  (LANOXIN ) 0.125 MG tablet, Take 1 tablet (0.125 mg total) by mouth daily., Disp: 90 tablet, Rfl: 3   empagliflozin  (JARDIANCE ) 10 MG TABS tablet, Take 1 tablet (10 mg total) by mouth daily before breakfast., Disp: 90 tablet, Rfl: 3   ferrous sulfate  325 (65 FE) MG tablet, Take 1 tablet (325 mg total) by mouth daily with breakfast., Disp: 30 tablet, Rfl: 3   furosemide  (LASIX ) 40 MG tablet, Take 1.5 tablets (60 mg total) by mouth daily., Disp: 90 tablet, Rfl: 3   hydrocortisone  (CORTEF ) 5 MG tablet, Take 1 tablet (5 mg total) by mouth daily. Start one daily after twice daily for 15 daily., Disp: 30 tablet, Rfl: 5   losartan  (COZAAR ) 25 MG tablet, Take 1 tablet (25 mg total) by mouth daily., Disp: 90 tablet, Rfl: 3   Multiple Vitamin (MULTIVITAMIN) tablet, Take 1 tablet by mouth daily., Disp: , Rfl:    spironolactone  (ALDACTONE ) 25 MG tablet, Take 0.5 tablets (12.5 mg total) by mouth daily., Disp: 45 tablet, Rfl: 3   docusate sodium  (COLACE) 100 MG capsule, Take 1 capsule (100 mg total) by mouth 2 (two) times daily. (Patient not taking: Reported on 03/23/2024), Disp: 10 capsule, Rfl: 0   hydrocortisone  (CORTEF ) 5 MG tablet, Take 1 tablet (5 mg total) by  mouth 2 (two) times daily for 15 days., Disp: 30 tablet, Rfl: 0   loperamide (IMODIUM) 2 MG capsule, Take 2 mg by mouth every 6 (six) hours as needed for diarrhea or loose stools. (Patient not taking: Reported on 04/07/2024), Disp: , Rfl:    senna (SENOKOT) 8.6 MG TABS tablet, Take 1 tablet (8.6 mg total) by mouth 2 (two) times daily. (Patient not taking: Reported on 03/17/2024), Disp: 120 tablet, Rfl: 0 Allergies  Allergen Reactions   Latex Swelling and Rash     Social History   Socioeconomic History   Marital status: Married    Spouse  name: Not on file   Number of children: 2   Years of education: Not on file   Highest education level: Bachelor's degree (e.g., BA, AB, BS)  Occupational History   Not on file  Tobacco Use   Smoking status: Never   Smokeless tobacco: Never  Vaping Use   Vaping status: Never Used  Substance and Sexual Activity   Alcohol  use: Not Currently    Comment: 1-3 beers daily   Drug use: No   Sexual activity: Yes  Other Topics Concern   Not on file  Social History Narrative   Not on file   Social Drivers of Health   Financial Resource Strain: Low Risk  (02/11/2024)   Overall Financial Resource Strain (CARDIA)    Difficulty of Paying Living Expenses: Not hard at all  Food Insecurity: No Food Insecurity (03/11/2024)   Hunger Vital Sign    Worried About Running Out of Food in the Last Year: Never true    Ran Out of Food in the Last Year: Never true  Transportation Needs: No Transportation Needs (03/11/2024)   PRAPARE - Administrator, Civil Service (Medical): No    Lack of Transportation (Non-Medical): No  Physical Activity: Insufficiently Active (02/11/2024)   Exercise Vital Sign    Days of Exercise per Week: 2 days    Minutes of Exercise per Session: 40 min  Stress: No Stress Concern Present (02/11/2024)   [employer] of Occupational Health - Occupational Stress Questionnaire    Feeling of Stress : Not at all  Social Connections: Socially Integrated (03/11/2024)   Social Connection and Isolation Panel [NHANES]    Frequency of Communication with Friends and Family: Never    Frequency of Social Gatherings with Friends and Family: More than three times a week    Attends Religious Services: More than 4 times per year    Active Member of Clubs or Organizations: Yes    Attends Banker Meetings: More than 4 times per year    Marital Status: Married  Catering manager Violence: Not At Risk (03/11/2024)   Humiliation, Afraid, Rape, and Kick questionnaire    Fear of  Current or Ex-Partner: No    Emotionally Abused: No    Physically Abused: No    Sexually Abused: No    Physical Exam      Future Appointments  Date Time Provider Department Center  04/16/2024  9:30 AM [facility] PA/NP [facility] None  03/16/2025  2:40 PM [facility]-ANNUAL WELLNESS VISIT [facility] None

## 2024-04-08 ENCOUNTER — Encounter (HOSPITAL_COMMUNITY)

## 2024-04-14 ENCOUNTER — Other Ambulatory Visit (HOSPITAL_COMMUNITY): Payer: Self-pay | Admitting: Emergency Medicine

## 2024-04-14 NOTE — Progress Notes (Signed)
 Paramedicine Encounter    Patient ID: Austin [name], male    DOB: Apr 30, 1956, 68 y.o.   MRN: [id number]   Complaints NONE  Assessment A&O x 4, skin W&D w/ good color. Denies chest pain or SOB. Lung sounds clear and equal bilat. Zoll monitor patch replaced.  Compliance with medsYES  Pill box filled x 1 week  Refills needed Dig, Hydrocortisone , Furosemide   Meds changes since last visit NONE    Social changes NONE   BP 110/80 (BP Location: Left Arm, Patient Position: Sitting, Cuff Size: Normal)   Pulse 70   Resp 16   Wt 145 lb (65.8 kg)   SpO2 98%   BMI 21.41 kg/m  Weight yesterday- not taken Last visit weight-143lb  ATF Austin [name] A&O x 4, skin W&D w/ good color.  No odor of ETOH today.  Pt has done well w/ med compliance.  He appears to be doing well wearing his Zoll monitor.  He has had a couple of occasions where it has come off in the bed while he's sleeping and [employer] has actually called him to find out what was happening at that time.  I replaced his patch today w/o incident and reminded him to take the [employer] device with him when he goes out w/ his wife to run errands today. Med box reconciled x 1 week.  Pt has refills that [facility] will [facility].  He knows he is to place 1 Digoxin  tablet in Tues/Wed a.m. slot.  ACTION: Home visit completed  Austin [name] [phone] 04/14/24  Patient Care Team: Susanna Epley, FNP as PCP - General (General Practice) Wendie Hamburg, MD as PCP - Cardiology (Cardiology)  Patient Active Problem List   Diagnosis Date Noted   Hyponatremia 03/18/2024   Acute on chronic systolic CHF (congestive heart failure) (HCC) 03/11/2024   Abnormal levels of other serum enzymes 03/11/2024   Anemia 03/11/2024   Rectal bleeding 03/11/2024   ABLA (acute blood loss anemia) 01/13/2024   Adrenal insufficiency (HCC) 01/12/2024   Closed left hip fracture (HCC) 01/10/2024   Transient hypotension 06/05/2023   Alcohol  use  disorder 06/05/2023   Hypertensive 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 (HCC) 06/05/2023   Elevated brain natriuretic peptide (BNP) level 06/05/2023   Elevated LFTs 05/18/2023   Prolonged QT interval 05/18/2023   AKI (acute kidney injury) (HCC) 05/17/2023   Iron deficiency anemia 09/03/2020   NICM (nonischemic cardiomyopathy) (HCC)    ETOH abuse 11/10/2018   Essential hypertension 11/10/2018   Blood in stool 11/10/2018    Current Outpatient Medications:    digoxin  (LANOXIN ) 0.125 MG tablet, Take 1 tablet (0.125 mg total) by mouth daily., Disp: 90 tablet, Rfl: 3   empagliflozin  (JARDIANCE ) 10 MG TABS tablet, Take 1 tablet (10 mg total) by mouth daily before breakfast., Disp: 90 tablet, Rfl: 3   ferrous sulfate  325 (65 FE) MG tablet, Take 1 tablet (325 mg total) by mouth daily with breakfast., Disp: 30 tablet, Rfl: 3   hydrocortisone  (CORTEF ) 5 MG tablet, Take 1 tablet (5 mg total) by mouth 2 (two) times daily for 15 days., Disp: 30 tablet, Rfl: 0   losartan  (COZAAR ) 25 MG tablet, Take 1 tablet (25 mg total) by mouth daily., Disp: 90 tablet, Rfl: 3   Multiple Vitamin (MULTIVITAMIN) tablet, Take 1 tablet by mouth daily., Disp: , Rfl:    spironolactone  (ALDACTONE ) 25 MG tablet, Take 0.5 tablets (12.5 mg total) by mouth daily., Disp:  45 tablet, Rfl: 3   docusate sodium  (COLACE) 100 MG capsule, Take 1 capsule (100 mg total) by mouth 2 (two) times daily. (Patient not taking: Reported on 03/23/2024), Disp: 10 capsule, Rfl: 0   furosemide  (LASIX ) 40 MG tablet, Take 1.5 tablets (60 mg total) by mouth daily., Disp: 90 tablet, Rfl: 3   hydrocortisone  (CORTEF ) 5 MG tablet, Take 1 tablet (5 mg total) by mouth daily. Start one daily after twice daily for 15 daily. (Patient not taking: Reported on 04/14/2024), Disp: 30 tablet, Rfl: 5   loperamide (IMODIUM) 2 MG capsule, Take 2 mg by mouth every 6 (six) hours as needed for diarrhea or loose stools. (Patient not  taking: Reported on 04/07/2024), Disp: , Rfl:    senna (SENOKOT) 8.6 MG TABS tablet, Take 1 tablet (8.6 mg total) by mouth 2 (two) times daily. (Patient not taking: Reported on 03/17/2024), Disp: 120 tablet, Rfl: 0 Allergies  Allergen Reactions   Latex Swelling and Rash     Social History   Socioeconomic History   Marital status: Married    Spouse name: Not on file   Number of children: 2   Years of education: Not on file   Highest education level: Bachelor's degree (e.g., BA, AB, BS)  Occupational History   Not on file  Tobacco Use   Smoking status: Never   Smokeless tobacco: Never  Vaping Use   Vaping status: Never Used  Substance and Sexual Activity   Alcohol  use: Not Currently    Comment: 1-3 beers daily   Drug use: No   Sexual activity: Yes  Other Topics Concern   Not on file  Social History Narrative   Not on file   Social Drivers of Health   Financial Resource Strain: Low Risk  (02/11/2024)   Overall Financial Resource Strain (CARDIA)    Difficulty of Paying Living Expenses: Not hard at all  Food Insecurity: No Food Insecurity (03/11/2024)   Hunger Vital Sign    Worried About Running Out of Food in the Last Year: Never true    Ran Out of Food in the Last Year: Never true  Transportation Needs: No Transportation Needs (03/11/2024)   PRAPARE - Administrator, Civil Service (Medical): No    Lack of Transportation (Non-Medical): No  Physical Activity: Insufficiently Active (02/11/2024)   Exercise Vital Sign    Days of Exercise per Week: 2 days    Minutes of Exercise per Session: 40 min  Stress: No Stress Concern Present (02/11/2024)   [employer] of Occupational Health - Occupational Stress Questionnaire    Feeling of Stress : Not at all  Social Connections: Socially Integrated (03/11/2024)   Social Connection and Isolation Panel [NHANES]    Frequency of Communication with Friends and Family: Never    Frequency of Social Gatherings with Friends and Family:  More than three times a week    Attends Religious Services: More than 4 times per year    Active Member of [employer] or Organizations: Yes    Attends Banker Meetings: More than 4 times per year    Marital Status: Married  Catering manager Violence: Not At Risk (03/11/2024)   Humiliation, Afraid, Rape, and Kick questionnaire    Fear of Current or Ex-Partner: No    Emotionally Abused: No    Physically Abused: No    Sexually Abused: No    Physical Exam      Future Appointments  Date Time Provider Department Center  04/16/2024  9:30 AM [facility] PA/NP [facility] None  03/16/2025  2:40 PM [facility]-ANNUAL WELLNESS VISIT [facility] None

## 2024-04-16 ENCOUNTER — Encounter (HOSPITAL_COMMUNITY): Payer: Self-pay

## 2024-04-16 ENCOUNTER — Ambulatory Visit (HOSPITAL_COMMUNITY)
Admission: RE | Admit: 2024-04-16 | Discharge: 2024-04-16 | Disposition: A | Discharge status: Home / Self Care | Source: Ambulatory Visit | Attending: Family Medicine

## 2024-04-16 ENCOUNTER — Other Ambulatory Visit (HOSPITAL_COMMUNITY): Payer: Self-pay | Admitting: Emergency Medicine

## 2024-04-16 VITALS — BP 136/84 | HR 101 | Ht 69.0 in | Wt 147.6 lb

## 2024-04-16 DIAGNOSIS — K7460 Unspecified cirrhosis of liver: Secondary | ICD-10-CM | POA: Insufficient documentation

## 2024-04-16 DIAGNOSIS — E871 Hypo-osmolality and hyponatremia: Secondary | ICD-10-CM | POA: Diagnosis not present

## 2024-04-16 DIAGNOSIS — Z7984 Long term (current) use of oral hypoglycemic drugs: Secondary | ICD-10-CM | POA: Diagnosis not present

## 2024-04-16 DIAGNOSIS — E2740 Unspecified adrenocortical insufficiency: Secondary | ICD-10-CM | POA: Diagnosis not present

## 2024-04-16 DIAGNOSIS — Z79899 Other long term (current) drug therapy: Secondary | ICD-10-CM | POA: Diagnosis not present

## 2024-04-16 DIAGNOSIS — I447 Left bundle-branch block, unspecified: Secondary | ICD-10-CM | POA: Diagnosis not present

## 2024-04-16 DIAGNOSIS — Z91148 Patient's other noncompliance with medication regimen for other reason: Secondary | ICD-10-CM | POA: Insufficient documentation

## 2024-04-16 DIAGNOSIS — I5022 Chronic systolic (congestive) heart failure: Secondary | ICD-10-CM | POA: Insufficient documentation

## 2024-04-16 DIAGNOSIS — I428 Other cardiomyopathies: Secondary | ICD-10-CM | POA: Diagnosis not present

## 2024-04-16 DIAGNOSIS — I110 Hypertensive heart disease with heart failure: Secondary | ICD-10-CM | POA: Insufficient documentation

## 2024-04-16 DIAGNOSIS — F1010 Alcohol abuse, uncomplicated: Secondary | ICD-10-CM | POA: Insufficient documentation

## 2024-04-16 DIAGNOSIS — I5021 Acute systolic (congestive) heart failure: Secondary | ICD-10-CM | POA: Diagnosis not present

## 2024-04-16 LAB — BASIC METABOLIC PANEL WITH GFR
Anion gap: 12 (ref 5–15)
BUN: 15 mg/dL (ref 8–23)
CO2: 26 mmol/L (ref 22–32)
Calcium: 9.4 mg/dL (ref 8.9–10.3)
Chloride: 94 mmol/L — ABNORMAL LOW (ref 98–111)
Creatinine, Ser: 1.09 mg/dL (ref 0.61–1.24)
GFR, Estimated: >60 mL/min (ref >60)
Glucose, Bld: 103 mg/dL — ABNORMAL HIGH (ref 70–99)
Potassium: 3.8 mmol/L (ref 3.5–5.1)
Sodium: 132 mmol/L — ABNORMAL LOW (ref 135–145)

## 2024-04-16 LAB — BRAIN NATRIURETIC PEPTIDE: B Natriuretic Peptide: 299.7 pg/mL — ABNORMAL HIGH (ref 0.0–100.0)

## 2024-04-16 MED ORDER — METOPROLOL SUCCINATE ER 25 MG PO TB24: 25.0000 mg | ORAL_TABLET | Freq: Every day | ORAL | 3 refills | Status: AC

## 2024-04-16 NOTE — Progress Notes (Signed)
 ADVANCED HF CLINIC NOTE   PCP: Susanna Epley, FNP Cardiology: Dr. Alda Amas HF Cardiology: Dr. Mitzie Anda  Reason for Visit: Heart Failure Follow-up HPI: 68 y.o. with history of ETOH abuse HTN, chronic systolic CHF. nonischemic cardiomyopathy.   HF dates back to 2021. Echo 3/21 EF < 20% with severe RV dysfunction.  RHC/LHC in 3/21 showed no CAD but elevated R > L filling pressures, preserved cardiac index.  Cardiac MRI 4/21: LVEF 14%, RVEF 16%, no LGE  EF had improved to 50-55% in 5/23.  Echo (8/24): EF 25-30%, GIDD, RV normal. Suspected drop in EF 2/2 med noncompliance and ETOH.   Echo (12/24): 25-30%  Seen by EP  1/25 CRT-D evaluation. EP felt that there might not be benefit of CRT-D. Did appear that LBBB is rate dependent, may better benefit from reduced rate.   He was readmitted 2/1-01/15/24 with fall resulting in left hip fracture s/p left hip arthroplasty. Course c/b anemia and hypotension. Workup c/w adrenal insufficiency. Discharged home on hydrocortisone . Entresto , Toprol  XL and spironolactone  were discontinued.  Seen in clinic 03/11/24 and sent to ED for admission d/t concern for low-output CHF. Had missed some medications d/t caring for his wife after a recent surgery.  Lactic acid elevated at 4.7. He diuresed with milrinone  support. GDMT titrated. He was enrolled in [facility] HFMS prior to discharge.  Today he returns for HF follow up with De[facility], paramedic. Overall feeling fine. No SOB walking on flat ground. Denies palpitations, abnormal bleeding, CP, dizziness, edema, or PND/Orthopnea. Appetite ok. Weight at home 145 pounds. Taking all medications. Drinking 2 cans of beer/day. Wants to go back to work at [employer] in [employer].    FH: Sister with CHF, brother with pacemaker, mother with CHF.   SH: Married, lives in [place], heavy ETOH in the past has now cut back, no drugs.  He was a Estate agent.   ROS: All systems reviewed and negative except as per HPI.   Current  Outpatient Medications  Medication Sig Dispense Refill   digoxin  (LANOXIN ) 0.125 MG tablet Take 1 tablet (0.125 mg total) by mouth daily. 90 tablet 3   docusate sodium  (COLACE) 100 MG capsule Take 1 capsule (100 mg total) by mouth 2 (two) times daily. 10 capsule 0   empagliflozin  (JARDIANCE ) 10 MG TABS tablet Take 1 tablet (10 mg total) by mouth daily before breakfast. 90 tablet 3   ferrous sulfate  325 (65 FE) MG tablet Take 1 tablet (325 mg total) by mouth daily with breakfast. 30 tablet 3   furosemide  (LASIX ) 40 MG tablet Take 1.5 tablets (60 mg total) by mouth daily. 90 tablet 3   hydrocortisone  (CORTEF ) 5 MG tablet Take 1 tablet (5 mg total) by mouth 2 (two) times daily for 15 days. 30 tablet 0   hydrocortisone  (CORTEF ) 5 MG tablet Take 1 tablet (5 mg total) by mouth daily. Start one daily after twice daily for 15 daily. 30 tablet 5   loperamide (IMODIUM) 2 MG capsule Take 2 mg by mouth every 6 (six) hours as needed for diarrhea or loose stools.     losartan  (COZAAR ) 25 MG tablet Take 1 tablet (25 mg total) by mouth daily. 90 tablet 3   Multiple Vitamin (MULTIVITAMIN) tablet Take 1 tablet by mouth daily.     senna (SENOKOT) 8.6 MG TABS tablet Take 1 tablet (8.6 mg total) by mouth 2 (two) times daily. 120 tablet 0   spironolactone  (ALDACTONE ) 25 MG tablet Take 0.5 tablets (12.5 mg total) by  mouth daily. 45 tablet 3   No current facility-administered medications for this encounter.   Wt Readings from Last 3 Encounters:  04/16/24 67 kg (147 lb 9.6 oz)  04/14/24 65.8 kg (145 lb)  04/01/24 64.9 kg (143 lb)   BP 136/84   Pulse (!) 101   Ht 5\' 9"  (1.753 m)   Wt 67 kg (147 lb 9.6 oz)   SpO2 98%   BMI 21.80 kg/m   Physical Exam General:  NAD. No resp difficulty, walked into clinic, appears older than stated age HEENT: Normal Neck: Supple. No JVD. Cor: Tachy rate & rhythm. No rubs, gallops or murmurs. Lungs: Clear Abdomen: Soft, nontender, nondistended.  Extremities: No cyanosis,  clubbing, rash, edema Neuro: Alert & oriented x 3, moves all 4 extremities w/o difficulty. Affect pleasant.  Assessment/Plan: 1. Chronic systolic CHF: NICM, possibly due to ETOH.  Echo 3/21 EF 20%, moderately decreased RV.  RHC/LHC 3/21 no significant coronary disease, preserved cardiac output, R>L heart failure.  Cardiac MRI in 4/21 showed EF 14%, severe RV dysfunction. No LGE but difficult LGE images, cannot rule out prior myocarditis.  TEE in 5/23 showed EF 50-55%, normal RV, trivial MR, mobile RA structure seen on TTE was [facility]. Echo in 8/24 with drop in EF to 25-30% in setting of heavy ETOH intake and noncompliance with meds. Echo 12/24 EF 25-30% on GDMT. Echo 4/25 EF 20-25%, LV with GHK, RV mod reduced. He has cut back some on ETOH but is still drinking. Recent admission for low-output HF requiring inotrope support. NYHA II, he is not volume overloaded by exam or personal review of [facility] HFMS data. - Restart Toprol  XL 25 mg daily. - Continue Lasix  60 mg daily. BMET and BNP today. - Continue losartan  25 mg daily - Continue digoxin  0.125 mg daily - Continue Jardiance  10 mg daily - Continue spiro 12.5 mg daily - Doing better with med compliance with paramedicine.  - Seen by Dr. Rodolfo Clan for CRT-D. Not sure that with age whether ICD would be beneficial for mortality. It was thought that LBBB was more rate dependent and would benefit from better rate control. Beta blocker not currently a good choice for him. In any case, has not been compliant. - Would likely not be a candidate for VAD with RV dysfunction. Not transplant candidate d/t ETOH abuse. 2. LBBB: Repeat Echo 12/24: 25-30% after compliance with medications and cutting back on ETOH. - See above CRT-D discussion 3. ETOH abuse: Long history of heavy ETOH. This may be the cause of his cardiomyopathy.   - Cut back to 2 beers/day. Needs to cut back further.  4. Hyponatremia: chronic. Na typically upper 120s-130s. - Likely d/t liver disease  +/- CHF and ETOH. - Evidence of cirrhosis noted on US  in 2021. 5. Adrenal insufficiency: Low am cortisol during recent admit in February. He was placed on hydrocortisone  - Has been referred to Endocrine  Follow up 3 months with Dr. Mitzie Anda. Continue paramedicine.  Arlice Bene Jena, FNP 04/16/2024

## 2024-04-16 NOTE — Patient Instructions (Signed)
 START Toprol  XL 25 mg daily.  Labs done today, your results will be available in MyChart, we will contact you for abnormal readings.   Your physician recommends that you schedule a follow-up appointment in: 3 months ( August) ** PLEASE CALL THE OFFICE IN JUNE TO ARRANGE YOUR FOLLOW UP APPOINTMENT.**  If you have any questions or concerns before your next appointment please send us  a message through [place] or call our office at [phone].    TO LEAVE A MESSAGE FOR THE NURSE SELECT OPTION 2, PLEASE LEAVE A MESSAGE INCLUDING: YOUR NAME DATE OF BIRTH CALL BACK NUMBER REASON FOR CALL**this is important as we prioritize the call backs  YOU WILL RECEIVE A CALL BACK THE SAME DAY AS LONG AS YOU CALL BEFORE 4:00 PM  At the [facility], you and your health needs are our priority. As part of our continuing mission to provide you with exceptional heart care, we have created designated Provider Care Teams. These Care Teams include your primary Cardiologist (physician) and Advanced Practice Providers (APPs- Physician Assistants and Nurse Practitioners) who all work together to provide you with the care you need, when you need it.   You may see any of the following providers on your designated Care Team at your next follow up: Dr Jules Oar Dr Peder Bourdon Dr. Alwin Baars Dr. Arta Lark Amy Marijane Shoulders, NP Ruddy Corral, [place] [facility] [place], [place] Dennise Fitz, NP [place] Lee, NP Shawnee Dellen, NP Luster Salters, PharmD Bevely Brush, PharmD   Please be sure to bring in all your medications bottles to every appointment.    Thank you for choosing [facility]

## 2024-04-16 NOTE — Progress Notes (Signed)
 Picked up pt's Furosemide , Digoxin  and  [facility]cortisone  from [facility].  Med rec only.  Pt seen in clinic today.  Metoprolol  25mg  added back to his regimen and 1 tablet Digoxin  added to Tues&Wed.     Ermalinda Hays, EMT-Paramedic [phone] 04/16/2024

## 2024-04-20 ENCOUNTER — Other Ambulatory Visit (HOSPITAL_COMMUNITY): Payer: Self-pay | Admitting: Emergency Medicine

## 2024-04-20 NOTE — Progress Notes (Unsigned)
 Paramedicine Encounter    Patient ID: Austin [name], male    DOB: 06-Jan-1956, 68 y.o.   MRN: [id number]   Complaints***  Assessment***  Compliance with meds***  Pill box filled***  Refills needed***  Meds changes since last visit***    Social changes None   There were no vitals taken for this visit. Weight yesterday-not taken Last visit weight-145lb  ACTION: {Paramed Action:[phone]}  Ermalinda Hays, EMT-Paramedic [phone] 04/20/24  Patient Care Team: Susanna Epley, FNP as PCP - General (General Practice) Wendie Hamburg, MD as PCP - Cardiology (Cardiology)  Patient Active Problem List   Diagnosis Date Noted  . Hyponatremia 03/18/2024  . Acute on chronic systolic CHF (congestive heart failure) (HCC) 03/11/2024  . Abnormal levels of other serum enzymes 03/11/2024  . Anemia 03/11/2024  . Rectal bleeding 03/11/2024  . ABLA (acute blood loss anemia) 01/13/2024  . Adrenal insufficiency (HCC) 01/12/2024  . Closed left hip fracture (HCC) 01/10/2024  . Transient hypotension 06/05/2023  . Alcohol  use disorder 06/05/2023  . 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 Elevated brain natriuretic peptide (BNP) level 06/05/2023  . Elevated LFTs 05/18/2023  . Prolonged QT interval 05/18/2023  . AKI (acute kidney injury) (HCC) 05/17/2023  . Iron deficiency anemia 09/03/2020  . NICM (nonischemic cardiomyopathy) (HCC)   . ETOH abuse 11/10/2018  . Essential hypertension 11/10/2018  . Blood in stool 11/10/2018    Current Outpatient Medications:  .  digoxin  (LANOXIN ) 0.125 MG tablet, Take 1 tablet (0.125 mg total) by mouth daily., Disp: 90 tablet, Rfl: 3 .  empagliflozin  (JARDIANCE ) 10 MG TABS tablet, Take 1 tablet (10 mg total) by mouth daily before breakfast., Disp: 90 tablet, Rfl: 3 .  ferrous sulfate  325 (65 FE) MG tablet, Take 1 tablet (325 mg total) by mouth daily with breakfast.,  Disp: 30 tablet, Rfl: 3 .  furosemide  (LASIX ) 40 MG tablet, Take 1.5 tablets (60 mg total) by mouth daily., Disp: 90 tablet, Rfl: 3 .  hydrocortisone  (CORTEF ) 5 MG tablet, Take 1 tablet (5 mg total) by mouth daily. Start one daily after twice daily for 15 daily., Disp: 30 tablet, Rfl: 5 .  losartan  (COZAAR ) 25 MG tablet, Take 1 tablet (25 mg total) by mouth daily., Disp: 90 tablet, Rfl: 3 .  metoprolol  succinate (TOPROL  XL) 25 MG 24 hr tablet, Take 1 tablet (25 mg total) by mouth daily., Disp: 90 tablet, Rfl: 3 .  Multiple Vitamin (MULTIVITAMIN) tablet, Take 1 tablet by mouth daily., Disp: , Rfl:  .  spironolactone  (ALDACTONE ) 25 MG tablet, Take 0.5 tablets (12.5 mg total) by mouth daily., Disp: 45 tablet, Rfl: 3 .  docusate sodium  (COLACE) 100 MG capsule, Take 1 capsule (100 mg total) by mouth 2 (two) times daily. (Patient not taking: Reported on 04/20/2024), Disp: 10 capsule, Rfl: 0 .  loperamide (IMODIUM) 2 MG capsule, Take 2 mg by mouth every 6 (six) hours as needed for diarrhea or loose stools. (Patient not taking: Reported on 04/20/2024), Disp: , Rfl:  .  senna (SENOKOT) 8.6 MG TABS tablet, Take 1 tablet (8.6 mg total) by mouth 2 (two) times daily. (Patient not taking: Reported on 04/20/2024), Disp: 120 tablet, Rfl: 0 Allergies  Allergen Reactions  . Latex Swelling and Rash     Social History   Socioeconomic History  . Marital status: Married    Spouse name: Not on file  . Number of children: 2  . Years of education: Not  on file  . Highest education level: Bachelor's degree (e.g., BA, AB, BS)  Occupational History  . Not on file  Tobacco Use  . Smoking status: Never  . Smokeless tobacco: Never  Vaping Use  . Vaping status: Never Used  Substance and Sexual Activity  . Alcohol  use: Not Currently    Comment: 1-3 beers daily  . Drug use: No  . Sexual activity: Yes  Other Topics Concern  . Not on file  Social History Narrative  . Not on file   Social Drivers of Health    Financial Resource Strain: Low Risk  (02/11/2024)   Overall Financial Resource Strain (CARDIA)   . Difficulty of Paying Living Expenses: Not hard at all  Food Insecurity: No Food Insecurity (03/11/2024)   Hunger Vital Sign   . Worried About Programme researcher, broadcasting/film/video in the Last Year: Never true   . Ran Out of Food in the Last Year: Never true  Transportation Needs: No Transportation Needs (03/11/2024)   PRAPARE - Transportation   . Lack of Transportation (Medical): No   . Lack of Transportation (Non-Medical): No  Physical Activity: Insufficiently Active (02/11/2024)   Exercise Vital Sign   . Days of Exercise per Week: 2 days   . Minutes of Exercise per Session: 40 min  Stress: No Stress Concern Present (02/11/2024)   [employer] of Occupational Health - Occupational Stress Questionnaire   . Feeling of Stress : Not at all  Social Connections: Socially Integrated (03/11/2024)   Social Connection and Isolation Panel [NHANES]   . Frequency of Communication with Friends and Family: Never   . Frequency of Social Gatherings with Friends and Family: More than three times a week   . Attends Religious Services: More than 4 times per year   . Active Member of Clubs or Organizations: Yes   . Attends Banker Meetings: More than 4 times per year   . Marital Status: Married  Catering manager Violence: Not At Risk (03/11/2024)   Humiliation, Afraid, Rape, and Kick questionnaire   . Fear of Current or Ex-Partner: No   . Emotionally Abused: No   . Physically Abused: No   . Sexually Abused: No    Physical Exam      Future Appointments  Date Time Provider Department Center  03/16/2025  2:40 PM [facility]-ANNUAL WELLNESS VISIT [facility] None

## 2024-04-27 ENCOUNTER — Other Ambulatory Visit (HOSPITAL_COMMUNITY): Payer: Self-pay | Admitting: Emergency Medicine
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
## 2024-05-04 ENCOUNTER — Other Ambulatory Visit (HOSPITAL_COMMUNITY): Payer: Self-pay | Admitting: Emergency Medicine

## 2024-05-04 NOTE — Progress Notes (Unsigned)
 Paramedicine Encounter    Patient ID: Austin [name], male    DOB: 03-06-1956, 68 y.o.   MRN: [id number]   Complaints NONE  Assessment A&O x 4, skin W&D w/ good color.  Denies chest pain or SOB. Lung sounds clear and equal bilat and no peripheral edema noted.  Compliance with meds YES  Pill box filled x 1 week  Refills needed Ferrous Sulfate   Meds changes since last visit NONE    Social changes NONE   BP 120/80 (BP Location: Left Arm, Patient Position: Sitting, Cuff Size: Normal)   Pulse 100   Resp 16   Wt 149 lb 3.2 oz (67.7 kg)   SpO2 96%   BMI 22.03 kg/m  Weight yesterday-not taken Last visit weight-147lb   ACTION: Home visit completed   Carlton Chick [phone] 05/04/24  Patient Care Team: Susanna Epley, FNP as PCP - General (General Practice) Wendie Hamburg, MD as PCP - Cardiology (Cardiology)  Patient Active Problem List   Diagnosis Date Noted  . Hyponatremia 03/18/2024  . Acute on chronic systolic CHF (congestive heart failure) (HCC) 03/11/2024  . Abnormal levels of other serum enzymes 03/11/2024  . Anemia 03/11/2024  . Rectal bleeding 03/11/2024  . ABLA (acute blood loss anemia) 01/13/2024  . Adrenal insufficiency (HCC) 01/12/2024  . Closed left hip fracture (HCC) 01/10/2024  . Transient hypotension 06/05/2023  . Alcohol  use disorder 06/05/2023  . 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 Elevated brain natriuretic peptide (BNP) level 06/05/2023  . Elevated LFTs 05/18/2023  . Prolonged QT interval 05/18/2023  . AKI (acute kidney injury) (HCC) 05/17/2023  . Iron deficiency anemia 09/03/2020  . NICM (nonischemic cardiomyopathy) (HCC)   . ETOH abuse 11/10/2018  . Essential hypertension 11/10/2018  . Blood in stool 11/10/2018    Current Outpatient Medications:  .  digoxin  (LANOXIN ) 0.125 MG tablet, Take 1 tablet (0.125 mg total) by mouth daily.,  Disp: 90 tablet, Rfl: 3 .  empagliflozin  (JARDIANCE ) 10 MG TABS tablet, Take 1 tablet (10 mg total) by mouth daily before breakfast., Disp: 90 tablet, Rfl: 3 .  ferrous sulfate  325 (65 FE) MG tablet, Take 1 tablet (325 mg total) by mouth daily with breakfast., Disp: 30 tablet, Rfl: 3 .  furosemide  (LASIX ) 40 MG tablet, Take 1.5 tablets (60 mg total) by mouth daily., Disp: 90 tablet, Rfl: 3 .  hydrocortisone  (CORTEF ) 5 MG tablet, Take 1 tablet (5 mg total) by mouth daily. Start one daily after twice daily for 15 daily., Disp: 30 tablet, Rfl: 5 .  losartan  (COZAAR ) 25 MG tablet, Take 1 tablet (25 mg total) by mouth daily., Disp: 90 tablet, Rfl: 3 .  metoprolol  succinate (TOPROL  XL) 25 MG 24 hr tablet, Take 1 tablet (25 mg total) by mouth daily., Disp: 90 tablet, Rfl: 3 .  Multiple Vitamin (MULTIVITAMIN) tablet, Take 1 tablet by mouth daily., Disp: , Rfl:  .  spironolactone  (ALDACTONE ) 25 MG tablet, Take 0.5 tablets (12.5 mg total) by mouth daily., Disp: 45 tablet, Rfl: 3 .  docusate sodium  (COLACE) 100 MG capsule, Take 1 capsule (100 mg total) by mouth 2 (two) times daily. (Patient not taking: Reported on 04/20/2024), Disp: 10 capsule, Rfl: 0 .  loperamide (IMODIUM) 2 MG capsule, Take 2 mg by mouth every 6 (six) hours as needed for diarrhea or loose stools. (Patient not taking: Reported on 04/20/2024), Disp: , Rfl:  .  senna (SENOKOT) 8.6 MG TABS tablet, Take  1 tablet (8.6 mg total) by mouth 2 (two) times daily. (Patient not taking: Reported on 04/27/2024), Disp: 120 tablet, Rfl: 0 Allergies  Allergen Reactions  . Latex Swelling and Rash     Social History   Socioeconomic History  . Marital status: Married    Spouse name: Not on file  . Number of children: 2  . Years of education: Not on file  . Highest education level: Bachelor's degree (e.g., BA, AB, BS)  Occupational History  . Not on file  Tobacco Use  . Smoking status: Never  . Smokeless tobacco: Never  Vaping Use  . Vaping status:  Never Used  Substance and Sexual Activity  . Alcohol  use: Not Currently    Comment: 1-3 beers daily  . Drug use: No  . Sexual activity: Yes  Other Topics Concern  . Not on file  Social History Narrative  . Not on file   Social Drivers of Health   Financial Resource Strain: Low Risk  (02/11/2024)   Overall Financial Resource Strain (CARDIA)   . Difficulty of Paying Living Expenses: Not hard at all  Food Insecurity: No Food Insecurity (03/11/2024)   Hunger Vital Sign   . Worried About Programme researcher, broadcasting/film/video in the Last Year: Never true   . Ran Out of Food in the Last Year: Never true  Transportation Needs: No Transportation Needs (03/11/2024)   PRAPARE - Transportation   . Lack of Transportation (Medical): No   . Lack of Transportation (Non-Medical): No  Physical Activity: Insufficiently Active (02/11/2024)   Exercise Vital Sign   . Days of Exercise per Week: 2 days   . Minutes of Exercise per Session: 40 min  Stress: No Stress Concern Present (02/11/2024)   [employer] of Occupational Health - Occupational Stress Questionnaire   . Feeling of Stress : Not at all  Social Connections: Socially Integrated (03/11/2024)   Social Connection and Isolation Panel [NHANES]   . Frequency of Communication with Friends and Family: Never   . Frequency of Social Gatherings with Friends and Family: More than three times a week   . Attends Religious Services: More than 4 times per year   . Active Member of Clubs or Organizations: Yes   . Attends Banker Meetings: More than 4 times per year   . Marital Status: Married  Catering manager Violence: Not At Risk (03/11/2024)   Humiliation, Afraid, Rape, and Kick questionnaire   . Fear of Current or Ex-Partner: No   . Emotionally Abused: No   . Physically Abused: No   . Sexually Abused: No    Physical Exam      Future Appointments  Date Time Provider Department Center  03/16/2025  2:40 PM [facility]-ANNUAL WELLNESS VISIT [facility] None

## 2024-05-06 ENCOUNTER — Telehealth (HOSPITAL_COMMUNITY): Payer: Self-pay | Admitting: Emergency Medicine

## 2024-05-06 NOTE — Telephone Encounter (Signed)
 On home visit 05/04/24 Mr. Austin [name] advised he received a letter from [employer] stating that his insurance had denied the claim for his [employer] device.  Paperwork was provided to him for signatures to allow [employer] to contest the denied claim.  Mr. Austin [name] did sign documents provided and these were mailed back to [employer].  In the meantime, Mr. Austin [name] wishes to no longer wear the monitoring device until word is heard back regarding coverage by insurance.    Ermalinda Hays, EMT-Paramedic [phone] 05/06/2024

## 2024-05-11 ENCOUNTER — Other Ambulatory Visit (HOSPITAL_COMMUNITY): Payer: Self-pay | Admitting: Emergency Medicine

## 2024-05-11 NOTE — Progress Notes (Signed)
 Paramedicine Encounter    Patient ID: Austin [name], male    DOB: June 18, 1956, 68 y.o.   MRN: [id number]   Complaints None   Assessment A&O x 4, skin W&D w/ good color.  Denies chest pain or SOB.  Lung sounds clear and equal bilat.  Compliance with meds YES  Pill box filled x 2 weeks  Refills needed Hydrocortisone  - called in  Meds changes since last visit NONE    Social changes NONE   BP 110/70 (BP Location: Left Arm, Patient Position: Sitting, Cuff Size: Normal)   Pulse 92   Resp 16   Wt 145 lb 9.6 oz (66 kg)   SpO2 96%   BMI 21.50 kg/m  Weight yesterday- not taken Last visit weight-149lb  ATF [name] A&O x 4, skin W&D w/ good color.  Denies chest pain or SOB.  Lung sounds clear and equal bilat.  He still continues to drink ETOH but says he has cut back.  Med box reconciled x 2 weeks.  Refills called in.  Next paramedicine appointment 05/25/24 @ 9:00   ACTION: Home visit completed  Carlton Chick [phone] 05/11/24  Patient Care Team: Susanna Epley, FNP as PCP - General (General Practice) Wendie Hamburg, MD as PCP - Cardiology (Cardiology)  Patient Active Problem List   Diagnosis Date Noted   Hyponatremia 03/18/2024   Acute on chronic systolic CHF (congestive heart failure) (HCC) 03/11/2024   Abnormal levels of other serum enzymes 03/11/2024   Anemia 03/11/2024   Rectal bleeding 03/11/2024   ABLA (acute blood loss anemia) 01/13/2024   Adrenal insufficiency (HCC) 01/12/2024   Closed left hip fracture (HCC) 01/10/2024   Transient hypotension 06/05/2023   Alcohol  use disorder 06/05/2023   Hypertensive 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 (HCC) 06/05/2023   Elevated brain natriuretic peptide (BNP) level 06/05/2023   Elevated LFTs 05/18/2023   Prolonged QT interval 05/18/2023   AKI (acute kidney injury) (HCC) 05/17/2023   Iron deficiency anemia 09/03/2020   NICM (nonischemic  cardiomyopathy) (HCC)    ETOH abuse 11/10/2018   Essential hypertension 11/10/2018   Blood in stool 11/10/2018    Current Outpatient Medications:    digoxin  (LANOXIN ) 0.125 MG tablet, Take 1 tablet (0.125 mg total) by mouth daily., Disp: 90 tablet, Rfl: 3   empagliflozin  (JARDIANCE ) 10 MG TABS tablet, Take 1 tablet (10 mg total) by mouth daily before breakfast., Disp: 90 tablet, Rfl: 3   ferrous sulfate  325 (65 FE) MG tablet, Take 1 tablet (325 mg total) by mouth daily with breakfast., Disp: 30 tablet, Rfl: 3   furosemide  (LASIX ) 40 MG tablet, Take 1.5 tablets (60 mg total) by mouth daily., Disp: 90 tablet, Rfl: 3   hydrocortisone  (CORTEF ) 5 MG tablet, Take 1 tablet (5 mg total) by mouth daily. Start one daily after twice daily for 15 daily., Disp: 30 tablet, Rfl: 5   losartan  (COZAAR ) 25 MG tablet, Take 1 tablet (25 mg total) by mouth daily., Disp: 90 tablet, Rfl: 3   metoprolol  succinate (TOPROL  XL) 25 MG 24 hr tablet, Take 1 tablet (25 mg total) by mouth daily., Disp: 90 tablet, Rfl: 3   Multiple Vitamin (MULTIVITAMIN) tablet, Take 1 tablet by mouth daily., Disp: , Rfl:    spironolactone  (ALDACTONE ) 25 MG tablet, Take 0.5 tablets (12.5 mg total) by mouth daily., Disp: 45 tablet, Rfl: 3   docusate sodium  (COLACE) 100 MG capsule, Take 1 capsule (100 mg total) by mouth 2 (two)  times daily. (Patient not taking: Reported on 04/20/2024), Disp: 10 capsule, Rfl: 0   loperamide (IMODIUM) 2 MG capsule, Take 2 mg by mouth every 6 (six) hours as needed for diarrhea or loose stools. (Patient not taking: Reported on 04/20/2024), Disp: , Rfl:    senna (SENOKOT) 8.6 MG TABS tablet, Take 1 tablet (8.6 mg total) by mouth 2 (two) times daily. (Patient not taking: Reported on 04/27/2024), Disp: 120 tablet, Rfl: 0 Allergies  Allergen Reactions   Latex Swelling and Rash     Social History   Socioeconomic History   Marital status: Married    Spouse name: Not on file   Number of children: 2   Years of  education: Not on file   Highest education level: Bachelor's degree (e.g., BA, AB, BS)  Occupational History   Not on file  Tobacco Use   Smoking status: Never   Smokeless tobacco: Never  Vaping Use   Vaping status: Never Used  Substance and Sexual Activity   Alcohol  use: Not Currently    Comment: 1-3 beers daily   Drug use: No   Sexual activity: Yes  Other Topics Concern   Not on file  Social History Narrative   Not on file   Social Drivers of Health   Financial Resource Strain: Low Risk  (02/11/2024)   Overall Financial Resource Strain (CARDIA)    Difficulty of Paying Living Expenses: Not hard at all  Food Insecurity: No Food Insecurity (03/11/2024)   Hunger Vital Sign    Worried About Running Out of Food in the Last Year: Never true    Ran Out of Food in the Last Year: Never true  Transportation Needs: No Transportation Needs (03/11/2024)   PRAPARE - Administrator, Civil Service (Medical): No    Lack of Transportation (Non-Medical): No  Physical Activity: Insufficiently Active (02/11/2024)   Exercise Vital Sign    Days of Exercise per Week: 2 days    Minutes of Exercise per Session: 40 min  Stress: No Stress Concern Present (02/11/2024)   [employer] of Occupational Health - Occupational Stress Questionnaire    Feeling of Stress : Not at all  Social Connections: Socially Integrated (03/11/2024)   Social Connection and Isolation Panel [NHANES]    Frequency of Communication with Friends and Family: Never    Frequency of Social Gatherings with Friends and Family: More than three times a week    Attends Religious Services: More than 4 times per year    Active Member of [employer] or Organizations: Yes    Attends Banker Meetings: More than 4 times per year    Marital Status: Married  Catering manager Violence: Not At Risk (03/11/2024)   Humiliation, Afraid, Rape, and Kick questionnaire    Fear of Current or Ex-Partner: No    Emotionally Abused: No     Physically Abused: No    Sexually Abused: No    Physical Exam      Future Appointments  Date Time Provider Department Center  03/16/2025  2:40 PM [facility]-ANNUAL WELLNESS VISIT [facility] None

## 2024-05-25 ENCOUNTER — Other Ambulatory Visit (HOSPITAL_COMMUNITY): Payer: Self-pay | Admitting: Emergency Medicine

## 2024-05-25 NOTE — Progress Notes (Signed)
 Paramedicine Encounter    Patient ID: Austin [name], male    DOB: April 07, 1956, 68 y.o.   MRN: [id number]   Complaints NONE  Assessment  A&O x 4, skin W&D w/ good color.  Denies chest pain or SOB.  Lung sounds clear and equal bilat. No peripheral edema noted  Compliance with meds YES  Pill box filled x 1 week  Refills needed Hydrocortisone , Losartan   Meds changes since last visit NONE    Social changes NONE   BP 90/70 (BP Location: Left Arm, Patient Position: Sitting, Cuff Size: Normal)   Pulse 64   Resp 16   Wt 150 lb 3.2 oz (68.1 kg)   SpO2 100%   BMI 22.18 kg/m  Weight yesterday- not taken Last visit weight-145.6lb  Assisted Austin [name] in scheduling follow up appt w/ Dr. Mitzie Anda in August.  Pt denies chest pain or SOB.  Lung sounds clear and equal bilat.  No peripheral edema noted.  Pt to p/u refills from [facility].  Needs to place 1 5mg . Hydrocortisone  in Mon&Tues regimen.   ACTION: Home visit completed  Carlton Chick [phone] 05/26/24  Patient Care Team: Susanna Epley, FNP as PCP - General (General Practice) Wendie Hamburg, MD as PCP - Cardiology (Cardiology)  Patient Active Problem List   Diagnosis Date Noted   Hyponatremia 03/18/2024   Acute on chronic systolic CHF (congestive heart failure) (HCC) 03/11/2024   Abnormal levels of other serum enzymes 03/11/2024   Anemia 03/11/2024   Rectal bleeding 03/11/2024   ABLA (acute blood loss anemia) 01/13/2024   Adrenal insufficiency (HCC) 01/12/2024   Closed left hip fracture (HCC) 01/10/2024   Transient hypotension 06/05/2023   Alcohol  use disorder 06/05/2023   Hypertensive 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 (HCC) 06/05/2023   Elevated brain natriuretic peptide (BNP) level 06/05/2023   Elevated LFTs 05/18/2023   Prolonged QT interval 05/18/2023   AKI (acute kidney injury) (HCC) 05/17/2023   Iron deficiency anemia 09/03/2020   NICM  (nonischemic cardiomyopathy) (HCC)    ETOH abuse 11/10/2018   Essential hypertension 11/10/2018   Blood in stool 11/10/2018    Current Outpatient Medications:    digoxin  (LANOXIN ) 0.125 MG tablet, Take 1 tablet (0.125 mg total) by mouth daily., Disp: 90 tablet, Rfl: 3   empagliflozin  (JARDIANCE ) 10 MG TABS tablet, Take 1 tablet (10 mg total) by mouth daily before breakfast., Disp: 90 tablet, Rfl: 3   ferrous sulfate  325 (65 FE) MG tablet, Take 1 tablet (325 mg total) by mouth daily with breakfast., Disp: 30 tablet, Rfl: 3   furosemide  (LASIX ) 40 MG tablet, Take 1.5 tablets (60 mg total) by mouth daily., Disp: 90 tablet, Rfl: 3   hydrocortisone  (CORTEF ) 5 MG tablet, Take 1 tablet (5 mg total) by mouth daily. Start one daily after twice daily for 15 daily., Disp: 30 tablet, Rfl: 5   losartan  (COZAAR ) 25 MG tablet, Take 1 tablet (25 mg total) by mouth daily., Disp: 90 tablet, Rfl: 3   metoprolol  succinate (TOPROL  XL) 25 MG 24 hr tablet, Take 1 tablet (25 mg total) by mouth daily., Disp: 90 tablet, Rfl: 3   Multiple Vitamin (MULTIVITAMIN) tablet, Take 1 tablet by mouth daily., Disp: , Rfl:    spironolactone  (ALDACTONE ) 25 MG tablet, Take 0.5 tablets (12.5 mg total) by mouth daily., Disp: 45 tablet, Rfl: 3   docusate sodium  (COLACE) 100 MG capsule, Take 1 capsule (100 mg total) by mouth 2 (two) times daily. (Patient not  taking: Reported on 05/25/2024), Disp: 10 capsule, Rfl: 0   loperamide (IMODIUM) 2 MG capsule, Take 2 mg by mouth every 6 (six) hours as needed for diarrhea or loose stools. (Patient not taking: Reported on 05/25/2024), Disp: , Rfl:    senna (SENOKOT) 8.6 MG TABS tablet, Take 1 tablet (8.6 mg total) by mouth 2 (two) times daily. (Patient not taking: Reported on 05/25/2024), Disp: 120 tablet, Rfl: 0 Allergies  Allergen Reactions   Latex Swelling and Rash     Social History   Socioeconomic History   Marital status: Married    Spouse name: Not on file   Number of children: 2    Years of education: Not on file   Highest education level: Bachelor's degree (e.g., BA, AB, BS)  Occupational History   Not on file  Tobacco Use   Smoking status: Never   Smokeless tobacco: Never  Vaping Use   Vaping status: Never Used  Substance and Sexual Activity   Alcohol  use: Not Currently    Comment: 1-3 beers daily   Drug use: No   Sexual activity: Yes  Other Topics Concern   Not on file  Social History Narrative   Not on file   Social Drivers of Health   Financial Resource Strain: Low Risk  (02/11/2024)   Overall Financial Resource Strain (CARDIA)    Difficulty of Paying Living Expenses: Not hard at all  Food Insecurity: No Food Insecurity (03/11/2024)   Hunger Vital Sign    Worried About Running Out of Food in the Last Year: Never true    Ran Out of Food in the Last Year: Never true  Transportation Needs: No Transportation Needs (03/11/2024)   PRAPARE - Administrator, Civil Service (Medical): No    Lack of Transportation (Non-Medical): No  Physical Activity: Insufficiently Active (02/11/2024)   Exercise Vital Sign    Days of Exercise per Week: 2 days    Minutes of Exercise per Session: 40 min  Stress: No Stress Concern Present (02/11/2024)   [employer] of Occupational Health - Occupational Stress Questionnaire    Feeling of Stress : Not at all  Social Connections: Socially Integrated (03/11/2024)   Social Connection and Isolation Panel    Frequency of Communication with Friends and Family: Never    Frequency of Social Gatherings with Friends and Family: More than three times a week    Attends Religious Services: More than 4 times per year    Active Member of Clubs or Organizations: Yes    Attends Banker Meetings: More than 4 times per year    Marital Status: Married  Catering manager Violence: Not At Risk (03/11/2024)   Humiliation, Afraid, Rape, and Kick questionnaire    Fear of Current or Ex-Partner: No    Emotionally Abused: No     Physically Abused: No    Sexually Abused: No    Physical Exam      Future Appointments  Date Time Provider Department Center  07/19/2024 10:20 AM Darlis Eisenmenger, MD [facility] None  03/16/2025  2:40 PM [facility]ANNUAL WELLNESS VISIT [facility] None

## 2024-06-01 ENCOUNTER — Other Ambulatory Visit (HOSPITAL_COMMUNITY): Payer: Self-pay | Admitting: Emergency Medicine

## 2024-06-01 NOTE — Progress Notes (Unsigned)
 Paramedicine Encounter    Patient ID: Austin [name], male    DOB: 1956-07-03, 68 y.o.   MRN: [id number]   Complaints***  Assessment***  Compliance with meds***  Pill box filled***  Refills needed***  Meds changes since last visit***    Social changes NONE   There were no vitals taken for this visit. Weight yesterday- not taken Last visit weight-150.2lb  ACTION: {Paramed Action:[id number]}  Mary Sharps, EMT-Paramedic [phone] 06/01/24  Patient Care Team: Georgina Speaks, FNP as PCP - General (General Practice) Kate Lonni CROME, MD as PCP - Cardiology (Cardiology)  Patient Active Problem List   Diagnosis Date Noted   Hyponatremia 03/18/2024   Acute on chronic systolic CHF (congestive heart failure) (HCC) 03/11/2024   Abnormal levels of other serum enzymes 03/11/2024   Anemia 03/11/2024   Rectal bleeding 03/11/2024   ABLA (acute blood loss anemia) 01/13/2024   Adrenal insufficiency (HCC) 01/12/2024   Closed left hip fracture (HCC) 01/10/2024   Transient hypotension 06/05/2023   Alcohol  use disorder 06/05/2023   Hypertensive 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 (HCC) 06/05/2023   Elevated brain natriuretic peptide (BNP) level 06/05/2023   Elevated LFTs 05/18/2023   Prolonged QT interval 05/18/2023   AKI (acute kidney injury) (HCC) 05/17/2023   Iron deficiency anemia 09/03/2020   NICM (nonischemic cardiomyopathy) (HCC)    ETOH abuse 11/10/2018   Essential hypertension 11/10/2018   Blood in stool 11/10/2018    Current Outpatient Medications:    digoxin  (LANOXIN ) 0.125 MG tablet, Take 1 tablet (0.125 mg total) by mouth daily., Disp: 90 tablet, Rfl: 3   docusate sodium  (COLACE) 100 MG capsule, Take 1 capsule (100 mg total) by mouth 2 (two) times daily. (Patient not taking: Reported on 05/25/2024), Disp: 10 capsule, Rfl: 0   empagliflozin  (JARDIANCE ) 10 MG TABS tablet, Take 1 tablet (10 mg total) by mouth  daily before breakfast., Disp: 90 tablet, Rfl: 3   ferrous sulfate  325 (65 FE) MG tablet, Take 1 tablet (325 mg total) by mouth daily with breakfast., Disp: 30 tablet, Rfl: 3   furosemide  (LASIX ) 40 MG tablet, Take 1.5 tablets (60 mg total) by mouth daily., Disp: 90 tablet, Rfl: 3   hydrocortisone  (CORTEF ) 5 MG tablet, Take 1 tablet (5 mg total) by mouth daily. Start one daily after twice daily for 15 daily., Disp: 30 tablet, Rfl: 5   loperamide (IMODIUM) 2 MG capsule, Take 2 mg by mouth every 6 (six) hours as needed for diarrhea or loose stools. (Patient not taking: Reported on 05/25/2024), Disp: , Rfl:    losartan  (COZAAR ) 25 MG tablet, Take 1 tablet (25 mg total) by mouth daily., Disp: 90 tablet, Rfl: 3   metoprolol  succinate (TOPROL  XL) 25 MG 24 hr tablet, Take 1 tablet (25 mg total) by mouth daily., Disp: 90 tablet, Rfl: 3   Multiple Vitamin (MULTIVITAMIN) tablet, Take 1 tablet by mouth daily., Disp: , Rfl:    senna (SENOKOT) 8.6 MG TABS tablet, Take 1 tablet (8.6 mg total) by mouth 2 (two) times daily. (Patient not taking: Reported on 05/25/2024), Disp: 120 tablet, Rfl: 0   spironolactone  (ALDACTONE ) 25 MG tablet, Take 0.5 tablets (12.5 mg total) by mouth daily., Disp: 45 tablet, Rfl: 3 Allergies  Allergen Reactions   Latex Swelling and Rash     Social History   Socioeconomic History   Marital status: Married    Spouse name: Not on file   Number of children: 2   Years of education:  Not on file   Highest education level: Bachelor's degree (e.g., BA, AB, BS)  Occupational History   Not on file  Tobacco Use   Smoking status: Never   Smokeless tobacco: Never  Vaping Use   Vaping status: Never Used  Substance and Sexual Activity   Alcohol  use: Not Currently    Comment: 1-3 beers daily   Drug use: No   Sexual activity: Yes  Other Topics Concern   Not on file  Social History Narrative   Not on file   Social Drivers of Health   Financial Resource Strain: Low Risk  (02/11/2024)    Overall Financial Resource Strain (CARDIA)    Difficulty of Paying Living Expenses: Not hard at all  Food Insecurity: No Food Insecurity (03/11/2024)   Hunger Vital Sign    Worried About Running Out of Food in the Last Year: Never true    Ran Out of Food in the Last Year: Never true  Transportation Needs: No Transportation Needs (03/11/2024)   PRAPARE - Administrator, Civil Service (Medical): No    Lack of Transportation (Non-Medical): No  Physical Activity: Insufficiently Active (02/11/2024)   Exercise Vital Sign    Days of Exercise per Week: 2 days    Minutes of Exercise per Session: 40 min  Stress: No Stress Concern Present (02/11/2024)   [employer] of Occupational Health - Occupational Stress Questionnaire    Feeling of Stress : Not at all  Social Connections: Socially Integrated (03/11/2024)   Social Connection and Isolation Panel    Frequency of Communication with Friends and Family: Never    Frequency of Social Gatherings with Friends and Family: More than three times a week    Attends Religious Services: More than 4 times per year    Active Member of Clubs or Organizations: Yes    Attends Banker Meetings: More than 4 times per year    Marital Status: Married  Catering manager Violence: Not At Risk (03/11/2024)   Humiliation, Afraid, Rape, and Kick questionnaire    Fear of Current or Ex-Partner: No    Emotionally Abused: No    Physically Abused: No    Sexually Abused: No    Physical Exam      Future Appointments  Date Time Provider Department Center  07/19/2024 10:20 AM Rolan Ezra RAMAN, MD [facility] None  03/16/2025  2:40 PM [facility]ANNUAL WELLNESS VISIT [facility] None

## 2024-06-08 ENCOUNTER — Other Ambulatory Visit (HOSPITAL_COMMUNITY): Payer: Self-pay | Admitting: Emergency Medicine

## 2024-06-08 NOTE — Progress Notes (Signed)
 Paramedicine Encounter    Patient ID: Austin [name], male    DOB: [birth year], 68 y.o.   MRN: [id number]   Complaints NONE  Assessment A&O x 4, skin W&D w/ good color.  Denies chest pain or SOB.  Lung sounds clear bilat.  No peripheral edema  Compliance with meds missed 1 dose out of 7  Pill box filled x 2 weeks  Refills needed NONE  Meds changes since last visit NONE    Social changes NONE   BP 110/80 (BP Location: Right Arm, Patient Position: Sitting, Cuff Size: Normal)   Pulse (!) 104   Resp 16   Wt 146 lb (66.2 kg)   BMI 21.56 kg/m  Weight yesterday- not taken Last visit weight-150lb  Med box reconciled x 2 weeks today.  Pt denies chest pain or SOB.  Lung sounds clear bilat and no edema noted.  He states that he has been working on cutting down his alcohol  consumption.  No odor of ETOH present this morning.  [facility]'t have another appointment at [facility] until 07/19/24 @ 10:20.   ACTION: Home visit completed  Mary Claudene Kennel [phone] 06/08/24  Patient Care Team: Georgina Speaks, FNP as PCP - General (General Practice) Kate Lonni CROME, MD as PCP - Cardiology (Cardiology)  Patient Active Problem List   Diagnosis Date Noted   Hyponatremia 03/18/2024   Acute on chronic systolic CHF (congestive heart failure) (HCC) 03/11/2024   Abnormal levels of other serum enzymes 03/11/2024   Anemia 03/11/2024   Rectal bleeding 03/11/2024   ABLA (acute blood loss anemia) 01/13/2024   Adrenal insufficiency (HCC) 01/12/2024   Closed left hip fracture (HCC) 01/10/2024   Transient hypotension 06/05/2023   Alcohol  use disorder 06/05/2023   Hypertensive 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 (HCC) 06/05/2023   Elevated brain natriuretic peptide (BNP) level 06/05/2023   Elevated LFTs 05/18/2023   Prolonged QT interval 05/18/2023   AKI (acute kidney injury) (HCC) 05/17/2023   Iron deficiency anemia 09/03/2020    NICM (nonischemic cardiomyopathy) (HCC)    ETOH abuse 11/10/2018   Essential hypertension 11/10/2018   Blood in stool 11/10/2018    Current Outpatient Medications:    digoxin  (LANOXIN ) 0.125 MG tablet, Take 1 tablet (0.125 mg total) by mouth daily., Disp: 90 tablet, Rfl: 3   empagliflozin  (JARDIANCE ) 10 MG TABS tablet, Take 1 tablet (10 mg total) by mouth daily before breakfast., Disp: 90 tablet, Rfl: 3   ferrous sulfate  325 (65 FE) MG tablet, Take 1 tablet (325 mg total) by mouth daily with breakfast., Disp: 30 tablet, Rfl: 3   furosemide  (LASIX ) 40 MG tablet, Take 1.5 tablets (60 mg total) by mouth daily., Disp: 90 tablet, Rfl: 3   hydrocortisone  (CORTEF ) 5 MG tablet, Take 1 tablet (5 mg total) by mouth daily. Start one daily after twice daily for 15 daily., Disp: 30 tablet, Rfl: 5   losartan  (COZAAR ) 25 MG tablet, Take 1 tablet (25 mg total) by mouth daily., Disp: 90 tablet, Rfl: 3   metoprolol  succinate (TOPROL  XL) 25 MG 24 hr tablet, Take 1 tablet (25 mg total) by mouth daily., Disp: 90 tablet, Rfl: 3   Multiple Vitamin (MULTIVITAMIN) tablet, Take 1 tablet by mouth daily., Disp: , Rfl:    spironolactone  (ALDACTONE ) 25 MG tablet, Take 0.5 tablets (12.5 mg total) by mouth daily., Disp: 45 tablet, Rfl: 3   docusate sodium  (COLACE) 100 MG capsule, Take 1 capsule (100 mg total) by mouth 2 (  two) times daily. (Patient not taking: Reported on 06/08/2024), Disp: 10 capsule, Rfl: 0   loperamide (IMODIUM) 2 MG capsule, Take 2 mg by mouth every 6 (six) hours as needed for diarrhea or loose stools. (Patient not taking: Reported on 06/08/2024), Disp: , Rfl:    senna (SENOKOT) 8.6 MG TABS tablet, Take 1 tablet (8.6 mg total) by mouth 2 (two) times daily. (Patient not taking: Reported on 06/08/2024), Disp: 120 tablet, Rfl: 0 Allergies  Allergen Reactions   Latex Swelling and Rash     Social History   Socioeconomic History   Marital status: Married    Spouse name: Not on file   Number of children: 2    Years of education: Not on file   Highest education level: Bachelor's degree (e.g., BA, AB, BS)  Occupational History   Not on file  Tobacco Use   Smoking status: Never   Smokeless tobacco: Never  Vaping Use   Vaping status: Never Used  Substance and Sexual Activity   Alcohol  use: Not Currently    Comment: 1-3 beers daily   Drug use: No   Sexual activity: Yes  Other Topics Concern   Not on file  Social History Narrative   Not on file   Social Drivers of Health   Financial Resource Strain: Low Risk  (02/11/2024)   Overall Financial Resource Strain (CARDIA)    Difficulty of Paying Living Expenses: Not hard at all  Food Insecurity: No Food Insecurity (03/11/2024)   Hunger Vital Sign    Worried About Running Out of Food in the Last Year: Never true    Ran Out of Food in the Last Year: Never true  Transportation Needs: No Transportation Needs (03/11/2024)   PRAPARE - Administrator, Civil Service (Medical): No    Lack of Transportation (Non-Medical): No  Physical Activity: Insufficiently Active (02/11/2024)   Exercise Vital Sign    Days of Exercise per Week: 2 days    Minutes of Exercise per Session: 40 min  Stress: No Stress Concern Present (02/11/2024)   [employer] of Occupational Health - Occupational Stress Questionnaire    Feeling of Stress : Not at all  Social Connections: Socially Integrated (03/11/2024)   Social Connection and Isolation Panel    Frequency of Communication with Friends and Family: Never    Frequency of Social Gatherings with Friends and Family: More than three times a week    Attends Religious Services: More than 4 times per year    Active Member of Clubs or Organizations: Yes    Attends Banker Meetings: More than 4 times per year    Marital Status: Married  Catering manager Violence: Not At Risk (03/11/2024)   Humiliation, Afraid, Rape, and Kick questionnaire    Fear of Current or Ex-Partner: No    Emotionally Abused: No     Physically Abused: No    Sexually Abused: No    Physical Exam      Future Appointments  Date Time Provider Department Center  07/19/2024 10:20 AM Rolan Ezra RAMAN, MD [facility] None  03/16/2025  2:40 PM [facility]-ANNUAL WELLNESS VISIT [facility] None

## 2024-06-15 ENCOUNTER — Other Ambulatory Visit (HOSPITAL_COMMUNITY): Payer: Self-pay | Admitting: Emergency Medicine

## 2024-06-15 NOTE — Progress Notes (Signed)
 Paramedicine Encounter    Patient ID: Austin [name], male    DOB: 06/27/1956, 68 y.o.   MRN: [id number]   Complaints NONE  Assessment A&O x 4, skin W&D w/ good color.  Lung sounds clear bilat. No peripheral edema noted.  Missed Sunday meds.  No odor of ETOH present today.  Compliance with meds Missed Sunday dose  Pill box filled YES  Refills needed NONE  Meds changes since last visit NONE     Social change NONE   BP 118/70 (BP Location: Left Arm, Patient Position: Sitting, Cuff Size: Normal)   Pulse 80   Resp 16   Wt 148 lb 6.4 oz (67.3 kg)   SpO2 93%   BMI 21.91 kg/m  Weight yesterday-not taken Last visit weight-146lb  ACTION: Home visit completed  Mary Claudene Kennel [phone] 06/15/24  Patient Care Team: Georgina Speaks, FNP as PCP - General (General Practice) Kate Lonni CROME, MD as PCP - Cardiology (Cardiology)  Patient Active Problem List   Diagnosis Date Noted   Hyponatremia 03/18/2024   Acute on chronic systolic CHF (congestive heart failure) (HCC) 03/11/2024   Abnormal levels of other serum enzymes 03/11/2024   Anemia 03/11/2024   Rectal bleeding 03/11/2024   ABLA (acute blood loss anemia) 01/13/2024   Adrenal insufficiency (HCC) 01/12/2024   Closed left hip fracture (HCC) 01/10/2024   Transient hypotension 06/05/2023   Alcohol  use disorder 06/05/2023   Hypertensive 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 (HCC) 06/05/2023   Elevated brain natriuretic peptide (BNP) level 06/05/2023   Elevated LFTs 05/18/2023   Prolonged QT interval 05/18/2023   AKI (acute kidney injury) (HCC) 05/17/2023   Iron deficiency anemia 09/03/2020   NICM (nonischemic cardiomyopathy) (HCC)    ETOH abuse 11/10/2018   Essential hypertension 11/10/2018   Blood in stool 11/10/2018    Current Outpatient Medications:    digoxin  (LANOXIN ) 0.125 MG tablet, Take 1 tablet (0.125 mg total) by mouth daily., Disp: 90  tablet, Rfl: 3   empagliflozin  (JARDIANCE ) 10 MG TABS tablet, Take 1 tablet (10 mg total) by mouth daily before breakfast., Disp: 90 tablet, Rfl: 3   ferrous sulfate  325 (65 FE) MG tablet, Take 1 tablet (325 mg total) by mouth daily with breakfast., Disp: 30 tablet, Rfl: 3   furosemide  (LASIX ) 40 MG tablet, Take 1.5 tablets (60 mg total) by mouth daily., Disp: 90 tablet, Rfl: 3   hydrocortisone  (CORTEF ) 5 MG tablet, Take 1 tablet (5 mg total) by mouth daily. Start one daily after twice daily for 15 daily., Disp: 30 tablet, Rfl: 5   losartan  (COZAAR ) 25 MG tablet, Take 1 tablet (25 mg total) by mouth daily., Disp: 90 tablet, Rfl: 3   metoprolol  succinate (TOPROL  XL) 25 MG 24 hr tablet, Take 1 tablet (25 mg total) by mouth daily., Disp: 90 tablet, Rfl: 3   Multiple Vitamin (MULTIVITAMIN) tablet, Take 1 tablet by mouth daily., Disp: , Rfl:    spironolactone  (ALDACTONE ) 25 MG tablet, Take 0.5 tablets (12.5 mg total) by mouth daily., Disp: 45 tablet, Rfl: 3   docusate sodium  (COLACE) 100 MG capsule, Take 1 capsule (100 mg total) by mouth 2 (two) times daily. (Patient not taking: Reported on 06/15/2024), Disp: 10 capsule, Rfl: 0   loperamide (IMODIUM) 2 MG capsule, Take 2 mg by mouth every 6 (six) hours as needed for diarrhea or loose stools. (Patient not taking: Reported on 06/15/2024), Disp: , Rfl:    senna (SENOKOT) 8.6 MG TABS tablet,  Take 1 tablet (8.6 mg total) by mouth 2 (two) times daily. (Patient not taking: Reported on 06/15/2024), Disp: 120 tablet, Rfl: 0 Allergies  Allergen Reactions   Latex Swelling and Rash     Social History   Socioeconomic History   Marital status: Married    Spouse name: Not on file   Number of children: 2   Years of education: Not on file   Highest education level: Bachelor's degree (e.g., BA, AB, BS)  Occupational History   Not on file  Tobacco Use   Smoking status: Never   Smokeless tobacco: Never  Vaping Use   Vaping status: Never Used  Substance and Sexual  Activity   Alcohol  use: Not Currently    Comment: 1-3 beers daily   Drug use: No   Sexual activity: Yes  Other Topics Concern   Not on file  Social History Narrative   Not on file   Social Drivers of Health   Financial Resource Strain: Low Risk  (02/11/2024)   Overall Financial Resource Strain (CARDIA)    Difficulty of Paying Living Expenses: Not hard at all  Food Insecurity: No Food Insecurity (03/11/2024)   Hunger Vital Sign    Worried About Running Out of Food in the Last Year: Never true    Ran Out of Food in the Last Year: Never true  Transportation Needs: No Transportation Needs (03/11/2024)   PRAPARE - Administrator, Civil Service (Medical): No    Lack of Transportation (Non-Medical): No  Physical Activity: Insufficiently Active (02/11/2024)   Exercise Vital Sign    Days of Exercise per Week: 2 days    Minutes of Exercise per Session: 40 min  Stress: No Stress Concern Present (02/11/2024)   [employer] of Occupational Health - Occupational Stress Questionnaire    Feeling of Stress : Not at all  Social Connections: Socially Integrated (03/11/2024)   Social Connection and Isolation Panel    Frequency of Communication with Friends and Family: Never    Frequency of Social Gatherings with Friends and Family: More than three times a week    Attends Religious Services: More than 4 times per year    Active Member of Clubs or Organizations: Yes    Attends Banker Meetings: More than 4 times per year    Marital Status: Married  Catering manager Violence: Not At Risk (03/11/2024)   Humiliation, Afraid, Rape, and Kick questionnaire    Fear of Current or Ex-Partner: No    Emotionally Abused: No    Physically Abused: No    Sexually Abused: No    Physical Exam      Future Appointments  Date Time Provider Department Center  07/19/2024 10:20 AM Rolan Ezra RAMAN, MD [facility] None  03/16/2025  2:40 PM [facility]-ANNUAL WELLNESS VISIT [facility] None

## 2024-06-22 ENCOUNTER — Other Ambulatory Visit (HOSPITAL_COMMUNITY): Payer: Self-pay | Admitting: Emergency Medicine

## 2024-06-22 NOTE — Progress Notes (Signed)
 Paramedicine Encounter    Patient ID: Austin [name], male    DOB: [birth year], 68 y.o.   MRN: [id number]   Complaints NONE  Assessment A&O x 4, skin W&D w/ good color.  Denies chest pain or SOB.  Lung sounds clear and no peripheral edema.  Pt w/ strong odor of ETOH on his breath and said he had several beers over the weekend.  Compliance with meds YES  Pill box filled x 2 weeks  Refills needed Hydrocortisone , Digoxin   Meds changes since last visit None    Social changes NONE   BP 110/80 (BP Location: Left Arm, Patient Position: Sitting, Cuff Size: Normal)   Pulse 94   Resp 16   Wt 150 lb (68 kg)   SpO2 97%   BMI 22.15 kg/m  Weight yesterday- none Last visit weight-148.4lb  ACTION: Home visit completed  Mary Claudene Kennel [phone] 06/22/24  Patient Care Team: Georgina Speaks, FNP as PCP - General (General Practice) Kate Lonni CROME, MD as PCP - Cardiology (Cardiology)  Patient Active Problem List   Diagnosis Date Noted   Hyponatremia 03/18/2024   Acute on chronic systolic CHF (congestive heart failure) (HCC) 03/11/2024   Abnormal levels of other serum enzymes 03/11/2024   Anemia 03/11/2024   Rectal bleeding 03/11/2024   ABLA (acute blood loss anemia) 01/13/2024   Adrenal insufficiency (HCC) 01/12/2024   Closed left hip fracture (HCC) 01/10/2024   Transient hypotension 06/05/2023   Alcohol  use disorder 06/05/2023   Hypertensive 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 (HCC) 06/05/2023   Elevated brain natriuretic peptide (BNP) level 06/05/2023   Elevated LFTs 05/18/2023   Prolonged QT interval 05/18/2023   AKI (acute kidney injury) (HCC) 05/17/2023   Iron deficiency anemia 09/03/2020   NICM (nonischemic cardiomyopathy) (HCC)    ETOH abuse 11/10/2018   Essential hypertension 11/10/2018   Blood in stool 11/10/2018    Current Outpatient Medications:    digoxin  (LANOXIN ) 0.125 MG tablet, Take 1  tablet (0.125 mg total) by mouth daily., Disp: 90 tablet, Rfl: 3   empagliflozin  (JARDIANCE ) 10 MG TABS tablet, Take 1 tablet (10 mg total) by mouth daily before breakfast., Disp: 90 tablet, Rfl: 3   ferrous sulfate  325 (65 FE) MG tablet, Take 1 tablet (325 mg total) by mouth daily with breakfast., Disp: 30 tablet, Rfl: 3   furosemide  (LASIX ) 40 MG tablet, Take 1.5 tablets (60 mg total) by mouth daily., Disp: 90 tablet, Rfl: 3   hydrocortisone  (CORTEF ) 5 MG tablet, Take 1 tablet (5 mg total) by mouth daily. Start one daily after twice daily for 15 daily., Disp: 30 tablet, Rfl: 5   losartan  (COZAAR ) 25 MG tablet, Take 1 tablet (25 mg total) by mouth daily., Disp: 90 tablet, Rfl: 3   metoprolol  succinate (TOPROL  XL) 25 MG 24 hr tablet, Take 1 tablet (25 mg total) by mouth daily., Disp: 90 tablet, Rfl: 3   Multiple Vitamin (MULTIVITAMIN) tablet, Take 1 tablet by mouth daily., Disp: , Rfl:    spironolactone  (ALDACTONE ) 25 MG tablet, Take 0.5 tablets (12.5 mg total) by mouth daily., Disp: 45 tablet, Rfl: 3   docusate sodium  (COLACE) 100 MG capsule, Take 1 capsule (100 mg total) by mouth 2 (two) times daily. (Patient not taking: Reported on 06/22/2024), Disp: 10 capsule, Rfl: 0   loperamide (IMODIUM) 2 MG capsule, Take 2 mg by mouth every 6 (six) hours as needed for diarrhea or loose stools. (Patient not taking: Reported on 06/22/2024), Disp: ,  Rfl:    senna (SENOKOT) 8.6 MG TABS tablet, Take 1 tablet (8.6 mg total) by mouth 2 (two) times daily. (Patient not taking: Reported on 06/22/2024), Disp: 120 tablet, Rfl: 0 Allergies  Allergen Reactions   Latex Swelling and Rash     Social History   Socioeconomic History   Marital status: Married    Spouse name: Not on file   Number of children: 2   Years of education: Not on file   Highest education level: Bachelor's degree (e.g., BA, AB, BS)  Occupational History   Not on file  Tobacco Use   Smoking status: Never   Smokeless tobacco: Never  Vaping Use    Vaping status: Never Used  Substance and Sexual Activity   Alcohol  use: Not Currently    Comment: 1-3 beers daily   Drug use: No   Sexual activity: Yes  Other Topics Concern   Not on file  Social History Narrative   Not on file   Social Drivers of Health   Financial Resource Strain: Low Risk  (02/11/2024)   Overall Financial Resource Strain (CARDIA)    Difficulty of Paying Living Expenses: Not hard at all  Food Insecurity: No Food Insecurity (03/11/2024)   Hunger Vital Sign    Worried About Running Out of Food in the Last Year: Never true    Ran Out of Food in the Last Year: Never true  Transportation Needs: No Transportation Needs (03/11/2024)   PRAPARE - Administrator, Civil Service (Medical): No    Lack of Transportation (Non-Medical): No  Physical Activity: Insufficiently Active (02/11/2024)   Exercise Vital Sign    Days of Exercise per Week: 2 days    Minutes of Exercise per Session: 40 min  Stress: No Stress Concern Present (02/11/2024)   [employer] of Occupational Health - Occupational Stress Questionnaire    Feeling of Stress : Not at all  Social Connections: Socially Integrated (03/11/2024)   Social Connection and Isolation Panel    Frequency of Communication with Friends and Family: Never    Frequency of Social Gatherings with Friends and Family: More than three times a week    Attends Religious Services: More than 4 times per year    Active Member of Clubs or Organizations: Yes    Attends Banker Meetings: More than 4 times per year    Marital Status: Married  Catering manager Violence: Not At Risk (03/11/2024)   Humiliation, Afraid, Rape, and Kick questionnaire    Fear of Current or Ex-Partner: No    Emotionally Abused: No    Physically Abused: No    Sexually Abused: No    Physical Exam      Future Appointments  Date Time Provider Department Center  07/19/2024 10:20 AM Rolan Ezra RAMAN, MD [facility] None  03/16/2025  2:40 PM  [facility]NUAL WELLNESS VISIT [facility] None

## 2024-07-06 ENCOUNTER — Other Ambulatory Visit (HOSPITAL_COMMUNITY): Payer: Self-pay | Admitting: Emergency Medicine

## 2024-07-06 NOTE — Progress Notes (Unsigned)
 Paramedicine Encounter    Patient ID: Austin [name], male    DOB: 1956-03-30, 68 y.o.   MRN: [id number]   Complaints***  Assessment***  Compliance with meds***  Pill box filled***  Refills needed Multivitamin, Digoxin , Hydrocortisone   Meds changes since last visit***    Social changes***   There were no vitals taken for this visit. Weight yesterday-*** Last visit weight-***  ACTION: {Paramed [phone]}  Mary Sharps, EMT-Paramedic [phone] 07/06/24  Patient Care Team: Georgina Speaks, FNP as PCP - General (General Practice) Kate Lonni CROME, MD as PCP - Cardiology (Cardiology)  Patient Active Problem List   Diagnosis Date Noted  . Hyponatremia 03/18/2024  . Acute on chronic systolic CHF (congestive heart failure) (HCC) 03/11/2024  . Abnormal levels of other serum enzymes 03/11/2024  . Anemia 03/11/2024  . Rectal bleeding 03/11/2024  . ABLA (acute blood loss anemia) 01/13/2024  . Adrenal insufficiency (HCC) 01/12/2024  . Closed left hip fracture (HCC) 01/10/2024  . Transient hypotension 06/05/2023  . Alcohol  use disorder 06/05/2023  . 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 Elevated brain natriuretic peptide (BNP) level 06/05/2023  . Elevated LFTs 05/18/2023  . Prolonged QT interval 05/18/2023  . AKI (acute kidney injury) (HCC) 05/17/2023  . Iron deficiency anemia 09/03/2020  . NICM (nonischemic cardiomyopathy) (HCC)   . ETOH abuse 11/10/2018  . Essential hypertension 11/10/2018  . Blood in stool 11/10/2018    Current Outpatient Medications:  .  digoxin  (LANOXIN ) 0.125 MG tablet, Take 1 tablet (0.125 mg total) by mouth daily., Disp: 90 tablet, Rfl: 3 .  docusate sodium  (COLACE) 100 MG capsule, Take 1 capsule (100 mg total) by mouth 2 (two) times daily. (Patient not taking: Reported on 06/22/2024), Disp: 10 capsule, Rfl: 0 .  empagliflozin  (JARDIANCE ) 10 MG TABS  tablet, Take 1 tablet (10 mg total) by mouth daily before breakfast., Disp: 90 tablet, Rfl: 3 .  ferrous sulfate  325 (65 FE) MG tablet, Take 1 tablet (325 mg total) by mouth daily with breakfast., Disp: 30 tablet, Rfl: 3 .  furosemide  (LASIX ) 40 MG tablet, Take 1.5 tablets (60 mg total) by mouth daily., Disp: 90 tablet, Rfl: 3 .  hydrocortisone  (CORTEF ) 5 MG tablet, Take 1 tablet (5 mg total) by mouth daily. Start one daily after twice daily for 15 daily., Disp: 30 tablet, Rfl: 5 .  loperamide (IMODIUM) 2 MG capsule, Take 2 mg by mouth every 6 (six) hours as needed for diarrhea or loose stools. (Patient not taking: Reported on 06/22/2024), Disp: , Rfl:  .  losartan  (COZAAR ) 25 MG tablet, Take 1 tablet (25 mg total) by mouth daily., Disp: 90 tablet, Rfl: 3 .  metoprolol  succinate (TOPROL  XL) 25 MG 24 hr tablet, Take 1 tablet (25 mg total) by mouth daily., Disp: 90 tablet, Rfl: 3 .  Multiple Vitamin (MULTIVITAMIN) tablet, Take 1 tablet by mouth daily., Disp: , Rfl:  .  senna (SENOKOT) 8.6 MG TABS tablet, Take 1 tablet (8.6 mg total) by mouth 2 (two) times daily. (Patient not taking: Reported on 06/22/2024), Disp: 120 tablet, Rfl: 0 .  spironolactone  (ALDACTONE ) 25 MG tablet, Take 0.5 tablets (12.5 mg total) by mouth daily., Disp: 45 tablet, Rfl: 3 Allergies  Allergen Reactions  . Latex Swelling and Rash     Social History   Socioeconomic History  . Marital status: Married    Spouse name: Not on file  . Number of children: 2  . Years of education:  Not on file  . Highest education level: Bachelor's degree (e.g., BA, AB, BS)  Occupational History  . Not on file  Tobacco Use  . Smoking status: Never  . Smokeless tobacco: Never  Vaping Use  . Vaping status: Never Used  Substance and Sexual Activity  . Alcohol  use: Not Currently    Comment: 1-3 beers daily  . Drug use: No  . Sexual activity: Yes  Other Topics Concern  . Not on file  Social History Narrative  . Not on file   Social  Drivers of Health   Financial Resource Strain: Low Risk  (02/11/2024)   Overall Financial Resource Strain (CARDIA)   . Difficulty of Paying Living Expenses: Not hard at all  Food Insecurity: No Food Insecurity (03/11/2024)   Hunger Vital Sign   . Worried About Programme researcher, broadcasting/film/video in the Last Year: Never true   . Ran Out of Food in the Last Year: Never true  Transportation Needs: No Transportation Needs (03/11/2024)   PRAPARE - Transportation   . Lack of Transportation (Medical): No   . Lack of Transportation (Non-Medical): No  Physical Activity: Insufficiently Active (02/11/2024)   Exercise Vital Sign   . Days of Exercise per Week: 2 days   . Minutes of Exercise per Session: 40 min  Stress: No Stress Concern Present (02/11/2024)   [employer] of Occupational Health - Occupational Stress Questionnaire   . Feeling of Stress : Not at all  Social Connections: Socially Integrated (03/11/2024)   Social Connection and Isolation Panel   . Frequency of Communication with Friends and Family: Never   . Frequency of Social Gatherings with Friends and Family: More than three times a week   . Attends Religious Services: More than 4 times per year   . Active Member of Clubs or Organizations: Yes   . Attends Banker Meetings: More than 4 times per year   . Marital Status: Married  Catering manager Violence: Not At Risk (03/11/2024)   Humiliation, Afraid, Rape, and Kick questionnaire   . Fear of Current or Ex-Partner: No   . Emotionally Abused: No   . Physically Abused: No   . Sexually Abused: No    Physical Exam      Future Appointments  Date Time Provider Department Center  07/19/2024 10:20 AM Rolan Ezra RAMAN, MD [facility] None  03/16/2025  2:40 PM [facility]ANNUAL WELLNESS VISIT [facility] None

## 2024-07-16 ENCOUNTER — Other Ambulatory Visit (HOSPITAL_COMMUNITY): Payer: Self-pay | Admitting: Emergency Medicine

## 2024-07-16 NOTE — Progress Notes (Signed)
 Mr. Austin [name] reached out to me today to let me know he would not be home for a visit today due to being called into work.  I requested to come by and do a med rec as I will not be available for a home visit next week. Met w/ pt's wife Austin [name].  Med box reconciled x 2 weeks.  Refills available for pick up.  He was supposed to have picked them up last week but did not do so.  He has missed several doses of his Hydrocortisone  and I have explained multiple times the importance of this med in the presence of Addison's. Austin [name] advised she was leaving to go pick Austin [name] up from work and will go by [facility] to pick up these meds.  She understands she is to add Hydrocortisone  1 tablet daily and 1 Digoxin  Thur, Fri, Sat of second week doses.  Refills: Digoxin , Hydrocortisone . MultiVitamin     Austin [name], EMT-Paramedic [phone] 07/16/2024

## 2024-07-19 ENCOUNTER — Encounter (HOSPITAL_COMMUNITY): Admitting: Cardiology

## 2024-07-29 ENCOUNTER — Telehealth (HOSPITAL_COMMUNITY): Payer: Self-pay | Admitting: Emergency Medicine
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
## 2024-07-30 ENCOUNTER — Other Ambulatory Visit (HOSPITAL_COMMUNITY): Payer: Self-pay | Admitting: Emergency Medicine
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
## 2024-08-03 ENCOUNTER — Other Ambulatory Visit (HOSPITAL_COMMUNITY): Payer: Self-pay | Admitting: Emergency Medicine
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
## 2024-08-19 ENCOUNTER — Telehealth (HOSPITAL_COMMUNITY): Payer: Self-pay | Admitting: Emergency Medicine
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
## 2024-08-29 ENCOUNTER — Telehealth (HOSPITAL_COMMUNITY): Payer: Self-pay | Admitting: Emergency Medicine

## 2024-08-29 NOTE — Telephone Encounter (Signed)
 Called to request home visit for this morning.  Spoke w/ Austin [name] who advises that Austin [name] does not want a visit.  I advised her that I'm concerned for his med compliance as he keeps refusing paramedicine home visits.    Mary Sharps, EMT-Paramedic [phone] 08/29/2024

## 2024-09-02 ENCOUNTER — Other Ambulatory Visit (HOSPITAL_COMMUNITY): Payer: Self-pay | Admitting: Emergency Medicine

## 2024-09-02 NOTE — Progress Notes (Unsigned)
 Paramedicine Encounter    Patient ID: Austin [name], male    DOB: 06-May-1956, 68 y.o.   MRN: [id number]   Complaints***  Assessment***  Compliance with meds***  Pill box filled x 2 weeks  Refills needed Hyddrocortisone  Meds changes since last visit***    Social changes***   BP 110/80 (BP Location: Left Arm, Patient Position: Sitting, Cuff Size: Normal)   Pulse 82   Resp 16   Wt 152 lb (68.9 kg)   SpO2 99%   BMI 22.45 kg/m  Weight yesterday- not taken Last visit weight-155lb  ACTION: {Paramed Action:[phone]}  Mary Sharps, EMT-Paramedic [phone] 09/02/24  Patient Care Team: Georgina Speaks, FNP as PCP - General (General Practice) Kate Lonni CROME, MD as PCP - Cardiology (Cardiology)  Patient Active Problem List   Diagnosis Date Noted  . Hyponatremia 03/18/2024  . Acute on chronic systolic CHF (congestive heart failure) (HCC) 03/11/2024  . Abnormal levels of other serum enzymes 03/11/2024  . Anemia 03/11/2024  . Rectal bleeding 03/11/2024  . ABLA (acute blood loss anemia) 01/13/2024  . Adrenal insufficiency 01/12/2024  . Closed left hip fracture (HCC) 01/10/2024  . Transient hypotension 06/05/2023  . Alcohol  use disorder 06/05/2023  . 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 Elevated brain natriuretic peptide (BNP) level 06/05/2023  . Elevated LFTs 05/18/2023  . Prolonged QT interval 05/18/2023  . AKI (acute kidney injury) 05/17/2023  . Iron deficiency anemia 09/03/2020  . NICM (nonischemic cardiomyopathy) (HCC)   . ETOH abuse 11/10/2018  . Essential hypertension 11/10/2018  . Blood in stool 11/10/2018    Current Outpatient Medications:  .  digoxin  (LANOXIN ) 0.125 MG tablet, Take 1 tablet (0.125 mg total) by mouth daily., Disp: 90 tablet, Rfl: 3 .  empagliflozin  (JARDIANCE ) 10 MG TABS tablet, Take 1 tablet (10 mg total) by mouth daily before breakfast., Disp: 90  tablet, Rfl: 3 .  ferrous sulfate  325 (65 FE) MG tablet, Take 1 tablet (325 mg total) by mouth daily with breakfast., Disp: 30 tablet, Rfl: 3 .  furosemide  (LASIX ) 40 MG tablet, Take 1.5 tablets (60 mg total) by mouth daily., Disp: 90 tablet, Rfl: 3 .  hydrocortisone  (CORTEF ) 5 MG tablet, Take 1 tablet (5 mg total) by mouth daily. Start one daily after twice daily for 15 daily., Disp: 30 tablet, Rfl: 5 .  losartan  (COZAAR ) 25 MG tablet, Take 1 tablet (25 mg total) by mouth daily., Disp: 90 tablet, Rfl: 3 .  metoprolol  succinate (TOPROL  XL) 25 MG 24 hr tablet, Take 1 tablet (25 mg total) by mouth daily., Disp: 90 tablet, Rfl: 3 .  spironolactone  (ALDACTONE ) 25 MG tablet, Take 0.5 tablets (12.5 mg total) by mouth daily., Disp: 45 tablet, Rfl: 3 .  docusate sodium  (COLACE) 100 MG capsule, Take 1 capsule (100 mg total) by mouth 2 (two) times daily. (Patient not taking: Reported on 09/02/2024), Disp: 10 capsule, Rfl: 0 .  loperamide (IMODIUM) 2 MG capsule, Take 2 mg by mouth every 6 (six) hours as needed for diarrhea or loose stools. (Patient not taking: Reported on 09/02/2024), Disp: , Rfl:  .  Multiple Vitamin (MULTIVITAMIN) tablet, Take 1 tablet by mouth daily. (Patient not taking: Reported on 09/02/2024), Disp: , Rfl:  .  senna (SENOKOT) 8.6 MG TABS tablet, Take 1 tablet (8.6 mg total) by mouth 2 (two) times daily. (Patient not taking: Reported on 09/02/2024), Disp: 120 tablet, Rfl: 0 Allergies  Allergen Reactions  . Latex Swelling  and Rash     Social History   Socioeconomic History  . Marital status: Married    Spouse name: Not on file  . Number of children: 2  . Years of education: Not on file  . Highest education level: Bachelor's degree (e.g., BA, AB, BS)  Occupational History  . Not on file  Tobacco Use  . Smoking status: Never  . Smokeless tobacco: Never  Vaping Use  . Vaping status: Never Used  Substance and Sexual Activity  . Alcohol  use: Not Currently    Comment: 1-3 beers daily   . Drug use: No  . Sexual activity: Yes  Other Topics Concern  . Not on file  Social History Narrative  . Not on file   Social Drivers of Health   Financial Resource Strain: Low Risk  (02/11/2024)   Overall Financial Resource Strain (CARDIA)   . Difficulty of Paying Living Expenses: Not hard at all  Food Insecurity: No Food Insecurity (03/11/2024)   Hunger Vital Sign   . Worried About Programme researcher, broadcasting/film/video in the Last Year: Never true   . Ran Out of Food in the Last Year: Never true  Transportation Needs: No Transportation Needs (03/11/2024)   PRAPARE - Transportation   . Lack of Transportation (Medical): No   . Lack of Transportation (Non-Medical): No  Physical Activity: Insufficiently Active (02/11/2024)   Exercise Vital Sign   . Days of Exercise per Week: 2 days   . Minutes of Exercise per Session: 40 min  Stress: No Stress Concern Present (02/11/2024)   [employer] of Occupational Health - Occupational Stress Questionnaire   . Feeling of Stress : Not at all  Social Connections: Socially Integrated (03/11/2024)   Social Connection and Isolation Panel   . Frequency of Communication with Friends and Family: Never   . Frequency of Social Gatherings with Friends and Family: More than three times a week   . Attends Religious Services: More than 4 times per year   . Active Member of Clubs or Organizations: Yes   . Attends Banker Meetings: More than 4 times per year   . Marital Status: Married  Catering manager Violence: Not At Risk (03/11/2024)   Humiliation, Afraid, Rape, and Kick questionnaire   . Fear of Current or Ex-Partner: No   . Emotionally Abused: No   . Physically Abused: No   . Sexually Abused: No    Physical Exam      Future Appointments  Date Time Provider Department Center  09/14/2024 11:00 AM Rolan Ezra [facility], MD [facility] None  03/16/2025  2:40 PM [facility]NUAL WELLNESS VISIT [facility] [id number]

## 2024-09-09 ENCOUNTER — Other Ambulatory Visit (HOSPITAL_COMMUNITY): Payer: Self-pay | Admitting: Emergency Medicine

## 2024-09-09 NOTE — Progress Notes (Signed)
 Paramedicine Encounter    Patient ID: Austin [name], male    DOB: 04-21-1956, 68 y.o.   MRN: [id number]   Complaints NONE  Assessment A&O x 4, skin W&D w/ good color.  Lung sounds clear and equal bilat.  No peripheral edema noted.  Compliance with meds No  Pill box filled x 2 weeks.  Week 2 needs 1 tablet Hydrocortisone  added to complete regimen.  Pt. aware  Refills needed NONE  Meds changes since last visit None    Social changes NONE   BP (!) 138/100 (BP Location: Left Arm, Patient Position: Sitting, Cuff Size: Normal)   Pulse 90   Resp 16   Wt 152 lb 12.8 oz (69.3 kg)   SpO2 98%   BMI 22.56 kg/m  Weight yesterday- not taken Last visit weight-152lb  ATF [name] A&O x 4, skin W&D w/ good color.  He denies chest pain or SOB.  Lung sounds clear throughout.  No peripheral edema noted. His BP is elevated today but he has not yet taken his morning meds.  He is working @ [employer] 15:00-23:00.  He states he plans on taking a nap, getting something to eat and then take his morning medications. He states he has cut back on his alcohol  consumption but says he likes to drink beer w/ the guys while he watches football.  I continue to encourage him to cut back on drinking as much as he can with the goal being to stop drinking completely. Med box reconciled x 2 weeks.  The second week of meds is missing his Hydrocortisone .  His wife is out and about and he called her during our visit and asked her to p/u his Hydrocortisone  refill.  Instructed him to add 1 pill daily (Hydrocortisone  5mg  tablet)  to his 2nd week of medications and he advised he would do so.  ACTION: Home visit completed  Mary Claudene Kennel [phone] 09/09/24  Patient Care Team: Georgina Speaks, FNP as PCP - General (General Practice) Kate Lonni CROME, MD as PCP - Cardiology (Cardiology)  Patient Active Problem List   Diagnosis Date Noted   Hyponatremia 03/18/2024   Acute on chronic systolic CHF  (congestive heart failure) (HCC) 03/11/2024   Abnormal levels of other serum enzymes 03/11/2024   Anemia 03/11/2024   Rectal bleeding 03/11/2024   ABLA (acute blood loss anemia) 01/13/2024   Adrenal insufficiency 01/12/2024   Closed left hip fracture (HCC) 01/10/2024   Transient hypotension 06/05/2023   Alcohol  use disorder 06/05/2023   Hypertensive 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 (HCC) 06/05/2023   Elevated brain natriuretic peptide (BNP) level 06/05/2023   Elevated LFTs 05/18/2023   Prolonged QT interval 05/18/2023   AKI (acute kidney injury) 05/17/2023   Iron deficiency anemia 09/03/2020   NICM (nonischemic cardiomyopathy) (HCC)    ETOH abuse 11/10/2018   Essential hypertension 11/10/2018   Blood in stool 11/10/2018    Current Outpatient Medications:    empagliflozin  (JARDIANCE ) 10 MG TABS tablet, Take 1 tablet (10 mg total) by mouth daily before breakfast., Disp: 90 tablet, Rfl: 3   ferrous sulfate  325 (65 FE) MG tablet, Take 1 tablet (325 mg total) by mouth daily with breakfast., Disp: 30 tablet, Rfl: 3   furosemide  (LASIX ) 40 MG tablet, Take 1.5 tablets (60 mg total) by mouth daily., Disp: 90 tablet, Rfl: 3   hydrocortisone  (CORTEF ) 5 MG tablet, Take 1 tablet (5 mg total) by mouth daily. Start one daily after twice  daily for 15 daily., Disp: 30 tablet, Rfl: 5   losartan  (COZAAR ) 25 MG tablet, Take 1 tablet (25 mg total) by mouth daily., Disp: 90 tablet, Rfl: 3   metoprolol  succinate (TOPROL  XL) 25 MG 24 hr tablet, Take 1 tablet (25 mg total) by mouth daily., Disp: 90 tablet, Rfl: 3   spironolactone  (ALDACTONE ) 25 MG tablet, Take 0.5 tablets (12.5 mg total) by mouth daily., Disp: 45 tablet, Rfl: 3   digoxin  (LANOXIN ) 0.125 MG tablet, Take 1 tablet (0.125 mg total) by mouth daily., Disp: 90 tablet, Rfl: 3   docusate sodium  (COLACE) 100 MG capsule, Take 1 capsule (100 mg total) by mouth 2 (two) times daily. (Patient not taking:  Reported on 09/09/2024), Disp: 10 capsule, Rfl: 0   loperamide (IMODIUM) 2 MG capsule, Take 2 mg by mouth every 6 (six) hours as needed for diarrhea or loose stools. (Patient not taking: Reported on 09/09/2024), Disp: , Rfl:    Multiple Vitamin (MULTIVITAMIN) tablet, Take 1 tablet by mouth daily. (Patient not taking: Reported on 09/09/2024), Disp: , Rfl:    senna (SENOKOT) 8.6 MG TABS tablet, Take 1 tablet (8.6 mg total) by mouth 2 (two) times daily. (Patient not taking: Reported on 09/09/2024), Disp: 120 tablet, Rfl: 0 Allergies  Allergen Reactions   Latex Swelling and Rash     Social History   Socioeconomic History   Marital status: Married    Spouse name: Not on file   Number of children: 2   Years of education: Not on file   Highest education level: Bachelor's degree (e.g., BA, AB, BS)  Occupational History   Not on file  Tobacco Use   Smoking status: Never   Smokeless tobacco: Never  Vaping Use   Vaping status: Never Used  Substance and Sexual Activity   Alcohol  use: Not Currently    Comment: 1-3 beers daily   Drug use: No   Sexual activity: Yes  Other Topics Concern   Not on file  Social History Narrative   Not on file   Social Drivers of Health   Financial Resource Strain: Low Risk  (02/11/2024)   Overall Financial Resource Strain (CARDIA)    Difficulty of Paying Living Expenses: Not hard at all  Food Insecurity: No Food Insecurity (03/11/2024)   Hunger Vital Sign    Worried About Running Out of Food in the Last Year: Never true    Ran Out of Food in the Last Year: Never true  Transportation Needs: No Transportation Needs (03/11/2024)   PRAPARE - Administrator, Civil Service (Medical): No    Lack of Transportation (Non-Medical): No  Physical Activity: Insufficiently Active (02/11/2024)   Exercise Vital Sign    Days of Exercise per Week: 2 days    Minutes of Exercise per Session: 40 min  Stress: No Stress Concern Present (02/11/2024)   [employer] of  Occupational Health - Occupational Stress Questionnaire    Feeling of Stress : Not at all  Social Connections: Socially Integrated (03/11/2024)   Social Connection and Isolation Panel    Frequency of Communication with Friends and Family: Never    Frequency of Social Gatherings with Friends and Family: More than three times a week    Attends Religious Services: More than 4 times per year    Active Member of [employer] or Organizations: Yes    Attends Engineer, structural: More than 4 times per year    Marital Status: Married  Catering manager Violence: Not At  Risk (03/11/2024)   Humiliation, Afraid, Rape, and Kick questionnaire    Fear of Current or Ex-Partner: No    Emotionally Abused: No    Physically Abused: No    Sexually Abused: No    Physical Exam      Future Appointments  Date Time Provider Department Center  09/14/2024 11:00 AM Rolan Ezra RAMAN, MD [facility] None  03/16/2025  2:40 PM [facility]NUAL WELLNESS VISIT [facility] [id number]

## 2024-09-14 ENCOUNTER — Ambulatory Visit (HOSPITAL_COMMUNITY): Payer: Self-pay | Admitting: Cardiology

## 2024-09-14 ENCOUNTER — Encounter (HOSPITAL_COMMUNITY): Payer: Self-pay | Admitting: Cardiology

## 2024-09-14 ENCOUNTER — Ambulatory Visit (HOSPITAL_COMMUNITY)
Admission: RE | Admit: 2024-09-14 | Discharge: 2024-09-14 | Disposition: A | Discharge status: Home / Self Care | Source: Ambulatory Visit | Attending: Cardiology | Admitting: Cardiology

## 2024-09-14 VITALS — BP 90/58 | HR 79 | Wt 150.0 lb

## 2024-09-14 DIAGNOSIS — I110 Hypertensive heart disease with heart failure: Secondary | ICD-10-CM | POA: Insufficient documentation

## 2024-09-14 DIAGNOSIS — F1010 Alcohol abuse, uncomplicated: Secondary | ICD-10-CM | POA: Insufficient documentation

## 2024-09-14 DIAGNOSIS — E871 Hypo-osmolality and hyponatremia: Secondary | ICD-10-CM | POA: Diagnosis not present

## 2024-09-14 DIAGNOSIS — Z7984 Long term (current) use of oral hypoglycemic drugs: Secondary | ICD-10-CM | POA: Insufficient documentation

## 2024-09-14 DIAGNOSIS — E2740 Unspecified adrenocortical insufficiency: Secondary | ICD-10-CM | POA: Diagnosis not present

## 2024-09-14 DIAGNOSIS — N529 Male erectile dysfunction, unspecified: Secondary | ICD-10-CM | POA: Diagnosis not present

## 2024-09-14 DIAGNOSIS — I5022 Chronic systolic (congestive) heart failure: Secondary | ICD-10-CM | POA: Insufficient documentation

## 2024-09-14 DIAGNOSIS — Z96642 Presence of left artificial hip joint: Secondary | ICD-10-CM | POA: Diagnosis not present

## 2024-09-14 DIAGNOSIS — Z79899 Other long term (current) drug therapy: Secondary | ICD-10-CM | POA: Diagnosis not present

## 2024-09-14 DIAGNOSIS — I447 Left bundle-branch block, unspecified: Secondary | ICD-10-CM | POA: Insufficient documentation

## 2024-09-14 DIAGNOSIS — I428 Other cardiomyopathies: Secondary | ICD-10-CM | POA: Insufficient documentation

## 2024-09-14 LAB — BASIC METABOLIC PANEL WITH GFR
Anion gap: 13 (ref 5–15)
BUN: 8 mg/dL (ref 8–23)
CO2: 22 mmol/L (ref 22–32)
Calcium: 8.4 mg/dL — ABNORMAL LOW (ref 8.9–10.3)
Chloride: 91 mmol/L — ABNORMAL LOW (ref 98–111)
Creatinine, Ser: 1.21 mg/dL (ref 0.61–1.24)
GFR, Estimated: >60 mL/min (ref >60)
Glucose, Bld: 84 mg/dL (ref 70–99)
Potassium: 3.5 mmol/L (ref 3.5–5.1)
Sodium: 126 mmol/L — ABNORMAL LOW (ref 135–145)

## 2024-09-14 LAB — BRAIN NATRIURETIC PEPTIDE: B Natriuretic Peptide: 344.9 pg/mL — ABNORMAL HIGH (ref 0.0–100.0)

## 2024-09-14 MED ORDER — SILDENAFIL CITRATE 25 MG PO TABS
50.0000 mg | ORAL_TABLET | ORAL | 0 refills | Status: DC | PRN
Start: 2024-09-14 — End: 2024-10-13

## 2024-09-14 MED ORDER — SPIRONOLACTONE 25 MG PO TABS: 25.0000 mg | ORAL_TABLET | Freq: Every day | ORAL | 3 refills | Status: AC

## 2024-09-14 NOTE — Patient Instructions (Signed)
 INCREASE Spironolactone  to 25 mg daily.  Labs done today, your results will be available in MyChart, we will contact you for abnormal readings.  REPEAT blood work in 10 days.  Your physician has requested that you have an echocardiogram. Echocardiography is a painless test that uses sound waves to create images of your heart. It provides your doctor with information about the size and shape of your heart and how well your heart's chambers and valves are working. This procedure takes approximately one hour. There are no restrictions for this procedure. Please do NOT wear cologne, perfume, aftershave, or lotions (deodorant is allowed). Please arrive 15 minutes prior to your appointment time.  Please note: We ask at that you not bring children with you during ultrasound (echo/ vascular) testing. Due to room size and safety concerns, children are not allowed in the ultrasound rooms during exams. Our front office staff cannot provide observation of children in our lobby area while testing is being conducted. An adult accompanying a patient to their appointment will only be allowed in the ultrasound room at the discretion of the ultrasound technician under special circumstances. We apologize for any inconvenience.  You have been referred to Dr. Inocencio. His office will call you to arrange your appointment.  Your physician recommends that you schedule a follow-up appointment in: 2 months.  If you have any questions or concerns before your next appointment please send us  a message through [place] or call our office at [phone].    TO LEAVE A MESSAGE FOR THE NURSE SELECT OPTION 2, PLEASE LEAVE A MESSAGE INCLUDING: YOUR NAME DATE OF BIRTH CALL BACK NUMBER REASON FOR CALL**this is important as we prioritize the call backs  YOU WILL RECEIVE A CALL BACK THE SAME DAY AS LONG AS YOU CALL BEFORE 4:00 PM  At the [facility], you and your health needs are our priority. As part of our  continuing mission to provide you with exceptional heart care, we have created designated Provider Care Teams. These Care Teams include your primary Cardiologist (physician) and Advanced Practice Providers (APPs- Physician Assistants and Nurse Practitioners) who all work together to provide you with the care you need, when you need it.   You may see any of the following providers on your designated Care Team at your next follow up: Dr Toribio Fuel Dr [name] Dr. Ria Commander Dr. Morene Brownie Amy Lenetta, NP Caffie Shed, [place] [facility], [place] Beckey Coe, NP [place] Lee, NP Ellouise Class, NP Tinnie Redman, PharmD Jaun Bash, PharmD   Please be sure to bring in all your medications bottles to every appointment.    Thank you for choosing [facility]

## 2024-09-15 NOTE — Progress Notes (Addendum)
 ADVANCED HF CLINIC NOTE   PCP: Georgina Speaks, FNP HF Cardiology: Dr. Rolan  Reason for Visit: Heart Failure Follow-up HPI: 68 y.o. with history of ETOH abuse HTN, chronic systolic CHF. nonischemic cardiomyopathy.   HF dates back to 2021. Echo 3/21 EF < 20% with severe RV dysfunction.  RHC/LHC in 3/21 showed no CAD but elevated R > L filling pressures, preserved cardiac index.  Cardiac MRI 4/21: LVEF 14%, RVEF 16%, no LGE  EF had improved to 50-55% in 5/23.  Echo (8/24) showed EF 25-30%, GIDD, RV normal. Suspected drop in EF 2/2 med noncompliance and ETOH.   Echo (12/24) showed EF 25-30.   Seen by EP in 1/25 CRT-D evaluation. EP felt that there might not be benefit of CRT-D. Did appear that LBBB is rate dependent, may better benefit from reduced rate.   He was readmitted 2/1-01/15/24 with fall resulting in left hip fracture s/p left hip arthroplasty. Course c/b anemia and hypotension. Workup suggested adrenal insufficiency. Discharged home on hydrocortisone . Entresto , Toprol  XL and spironolactone  were discontinued.  Seen in clinic 03/11/24 and sent to ED for admission due to concern for low-output CHF. Had missed some medications while caring for his wife after a recent surgery.  Lactic acid elevated at 4.7. He diuresed with milrinone  support. GDMT titrated. He was enrolled in [facility] prior to discharge.  Echo in 4/25 showed EF 20-25%.    He returns for followup of CHF.  Still drinking some ETOH, says only about 4 beers/week when he is watching football. He has gone back to work part time in the [employer].  He denies exertional dyspnea.  No orthopnea/PND.  No chest pain.  BP runs low but he denies lightheadedness.  He has been out of digoxin  for about a week.   ECG (personally reviewed): NSR, IVCD 154 msec  Labs (5/25): K 3.8, creatinine 1.09, BNP 300  PMH: 1. Prostate cancer 2. HTN: BP now low.  3. Fe deficiency anemia 4. ETOH abuse 5. GERD 6. Chronic systolic CHF:  Nonischemic cardiomyopathy. ?Due to ETOH abuse.  - Echo 3/21 with EF < 20%, severe global HK, severely dilated and severely dysfunctional RV, severe biatrial enlargement.  - RHC/LHC (3/21): No CAD; mean RA 15, PA 51/24, mean PCWP 19, CI 3.96.  - HIV negative 4/21 - Cardiac MRI (4/21): LV EF 14%, moderate LV dilation, mild RV dilation with severely decreased systolic function, moderate pericardial effusion, no LGE noted but images difficult due to recent Fe infusion.  - Echo (8/21): EF 30-35%, diffuse hypokinesis, normal RV, no pericardial fluid.  - Echo (10/21): EF 45-50%, diffuse hypokinesis, normal RV.  - Echo (3/22): EF 45-50%, normal RV, PASP 34 mmHg.  - TEE (5/23): EF 50-55%, normal RV, trivial MR, mobile RA structure seen on TTE was [facility].  - Echo (8/24): EF 25-30%, GIDD, RV normal  - Echo (12/24): 25-30%, mod reduced RV, mild MR, mid TR. - Echo (4/25): EF 20-25%, global hypokinesis, moderate RV enlargement with moderate RV systolic dysfunction, no MR.   FH: Sister with CHF, brother with pacemaker, mother with CHF.   SH: Married, lives in [place], heavy ETOH in the past has now cut back, no drugs.  He was a Estate agent, now works part-time at [employer] in [employer].   ROS: All systems reviewed and negative except as per HPI.   Current Outpatient Medications  Medication Sig Dispense Refill   docusate sodium  (COLACE) 100 MG capsule Take 1 capsule (100  mg total) by mouth 2 (two) times daily. 10 capsule 0   empagliflozin  (JARDIANCE ) 10 MG TABS tablet Take 1 tablet (10 mg total) by mouth daily before breakfast. 90 tablet 3   ferrous sulfate  325 (65 FE) MG tablet Take 1 tablet (325 mg total) by mouth daily with breakfast. 30 tablet 3   furosemide  (LASIX ) 40 MG tablet Take 1.5 tablets (60 mg total) by mouth daily. 90 tablet 3   hydrocortisone  (CORTEF ) 5 MG tablet Take 1 tablet (5 mg total) by mouth daily. Start one daily after twice daily for 15 daily. 30 tablet 5    loperamide (IMODIUM) 2 MG capsule Take 2 mg by mouth every 6 (six) hours as needed for diarrhea or loose stools.     losartan  (COZAAR ) 25 MG tablet Take 1 tablet (25 mg total) by mouth daily. 90 tablet 3   metoprolol  succinate (TOPROL  XL) 25 MG 24 hr tablet Take 1 tablet (25 mg total) by mouth daily. 90 tablet 3   Multiple Vitamin (MULTIVITAMIN) tablet Take 1 tablet by mouth daily.     senna (SENOKOT) 8.6 MG TABS tablet Take 1 tablet (8.6 mg total) by mouth 2 (two) times daily. 120 tablet 0   digoxin  (LANOXIN ) 0.125 MG tablet Take 1 tablet (0.125 mg total) by mouth daily. (Patient not taking: Reported on 09/14/2024) 90 tablet 3   sildenafil (VIAGRA) 25 MG tablet Take 2 tablets (50 mg total) by mouth as needed. 10 tablet 0   spironolactone  (ALDACTONE ) 25 MG tablet Take 1 tablet (25 mg total) by mouth daily. 90 tablet 3   No current facility-administered medications for this encounter.   Wt Readings from Last 3 Encounters:  09/14/24 68 kg (150 lb)  09/09/24 69.3 kg (152 lb 12.8 oz)  09/02/24 68.9 kg (152 lb)   BP (!) 90/58   Pulse 79   Wt 68 kg (150 lb)   SpO2 98%   BMI 22.15 kg/m   Physical Exam General: NAD Neck: No JVD, no thyromegaly or thyroid nodule.  Lungs: Clear to auscultation bilaterally with normal respiratory effort. CV: Lateral PMI.  Heart regular S1/S2, no S3/S4, no murmur.  No peripheral edema.  No carotid bruit.  Normal pedal pulses.  Abdomen: Soft, nontender, no hepatosplenomegaly, no distention.  Skin: Intact without lesions or rashes.  Neurologic: Alert and oriented x 3.  Psych: Normal affect. Extremities: No clubbing or cyanosis.  HEENT: Normal.   Assessment/Plan: 1. Chronic systolic CHF: NICM, possibly due to ETOH.  Echo 3/21 EF 20%, moderately decreased RV.  RHC/LHC 3/21 no significant coronary disease, preserved cardiac output, R>L heart failure.  Cardiac MRI in 4/21 showed EF 14%, severe RV dysfunction; no LGE but difficult LGE images.  TEE in 5/23 showed EF  up to 50-55%, normal RV, trivial MR, mobile RA structure seen on TTE was [facility]. Echo in 8/24 with drop in EF to 25-30% in setting of heavy ETOH intake and noncompliance with meds. Echo 12/24 EF 25-30% on GDMT. Echo 4/25 with EF 20-25%, RV mod reduced systolic function. 4/25 admission for low-output HF requiring inotrope support. He says that he has cut back on ETOH. NYHA class I-II, not volume overloaded on exam. He does not have much BP room for medication titration. - I will arrange for repeat echo on GDMT and hopefully on minimal ETOH.  If EF remains low, would like him to see EP again to consider CRT-D (has ECGs with wide QRS, varying nonspecific IVCD and LBBB).  - Continue  Toprol  XL 25 mg daily. - Continue Lasix  60 mg daily.  - Continue losartan  25 mg daily, no BP room to transition to Entresto .  - Restart digoxin  0.125 mg daily - Continue Jardiance  10 mg daily - Increase spironolactone  to 25 mg daily with BMET/BNP today and BMET in 10 days.  - Doing better with med compliance with paramedicine.  - May not be a good candidate for VAD with RV dysfunction.  2. LBBB/IVCD: If repeat echo shows that EF is still low despite medication compliance and minimal ETOH, would favor CRT-D.  3. ETOH abuse: Long history of heavy ETOH. This may be the cause of his cardiomyopathy.  He says that he has cut back.  I would like to see him stop completely.  4. Hyponatremia: chronic. Na typically upper 120s-130s. - May be related to ETOH abuse.  5. Adrenal insufficiency: Low am cortisol during 2/25 admission. He was placed on hydrocortisone .  - Needs endocrinology appt, will refer.  6. Erectile dysfunction: Will refill low dose Viagra.   Follow up 2 months with APP.   I spent 32 minutes reviewing records, interviewing/examining patient, and managing orders.   [name], MD 09/15/2024

## 2024-09-16 ENCOUNTER — Telehealth (HOSPITAL_COMMUNITY): Payer: Self-pay | Admitting: Emergency Medicine

## 2024-09-16 NOTE — Telephone Encounter (Signed)
 Spoke with Mrs. Waddell regarding scheduling a home visit for Austin [name].   Will see him 09/21/24 @ 9:00.    Mary Sharps, EMT-Paramedic [phone] 09/16/2024

## 2024-09-22 ENCOUNTER — Other Ambulatory Visit (HOSPITAL_COMMUNITY)

## 2024-09-23 ENCOUNTER — Ambulatory Visit (HOSPITAL_COMMUNITY)
Admission: RE | Admit: 2024-09-23 | Discharge: 2024-09-23 | Disposition: A | Discharge status: Home / Self Care | Source: Ambulatory Visit | Attending: Internal Medicine | Admitting: Internal Medicine

## 2024-09-23 ENCOUNTER — Ambulatory Visit (HOSPITAL_COMMUNITY): Payer: Self-pay | Admitting: Cardiology

## 2024-09-23 DIAGNOSIS — I5022 Chronic systolic (congestive) heart failure: Secondary | ICD-10-CM | POA: Insufficient documentation

## 2024-09-23 LAB — BASIC METABOLIC PANEL WITH GFR
Anion gap: 13 (ref 5–15)
BUN: 8 mg/dL (ref 8–23)
CO2: 20 mmol/L — ABNORMAL LOW (ref 22–32)
Calcium: 8.4 mg/dL — ABNORMAL LOW (ref 8.9–10.3)
Chloride: 95 mmol/L — ABNORMAL LOW (ref 98–111)
Creatinine, Ser: 1.11 mg/dL (ref 0.61–1.24)
GFR, Estimated: >60 mL/min (ref >60)
Glucose, Bld: 115 mg/dL — ABNORMAL HIGH (ref 70–99)
Potassium: 4 mmol/L (ref 3.5–5.1)
Sodium: 128 mmol/L — ABNORMAL LOW (ref 135–145)

## 2024-09-24 ENCOUNTER — Other Ambulatory Visit (HOSPITAL_COMMUNITY)

## 2024-10-05 ENCOUNTER — Telehealth (HOSPITAL_COMMUNITY): Payer: Self-pay | Admitting: Emergency Medicine

## 2024-10-05 NOTE — Telephone Encounter (Signed)
 Called @ 8:30 this morning to confirm 9:00 home visit.  Mrs. Ronan advised they are in line at the [facility] since 6:30 a.m.  She advises me she will call me when they get back home. I will see him later today for home visit.    Mary Sharps, EMT-Paramedic [phone] 10/05/2024

## 2024-10-08 ENCOUNTER — Telehealth (HOSPITAL_COMMUNITY): Payer: Self-pay | Admitting: Emergency Medicine

## 2024-10-08 ENCOUNTER — Other Ambulatory Visit (HOSPITAL_COMMUNITY): Payer: Self-pay | Admitting: Cardiology

## 2024-10-08 NOTE — Telephone Encounter (Signed)
 Spoke w/ Lorie requesting home visit today for Austin [name] as he had cancelled earlier in the week.  Austin [name] advised he could be seen for Paramedicine home visit Tues 11/4 @ 9:00.    Mary Sharps, EMT-Paramedic [phone] 10/08/2024

## 2024-10-12 ENCOUNTER — Other Ambulatory Visit (HOSPITAL_COMMUNITY): Payer: Self-pay | Admitting: Emergency Medicine

## 2024-10-12 NOTE — Progress Notes (Signed)
 Paramedicine Encounter    Patient ID: Austin [name], male    DOB: 1956/01/30, 68 y.o.   MRN: [id number]   Complaints NONE  Assessment A&O x 4, skin W&D w/ good color.  Denies chest pain or SOB.  Lung sounds clear and equal bilat.  No peripheral edema noted.  Compliance with meds NO  Pill box filled x 3 weeks  Refills needed NONE  Meds changes since last visit Sidenafil added prn ED    Social changes NONE   BP 100/60 (BP Location: Left Arm, Patient Position: Sitting, Cuff Size: Normal)   Pulse 86   Resp 16   Wt 147 lb (66.7 kg)   SpO2 98%   BMI 21.71 kg/m  Weight yesterday-  Last visit weight-152lb - (Oct 2)  Upon entering the home, strong odor of ETOH present.  ATF Austin [name] A&O x 4, skin W&D w/ good color.  Lung sounds clear throughout.  No peripheral edema noted.  I have not seen Austin [name] since 09/09/24.  Multiple home visits have been scheduled but they have been cancelled by patient in each instance.  Pt has not been compliant w/ his meds.  I have had a lot going on. He says.   Med box reconciled per med list and notes from his visit w/ Dr. Rolan 09/14/24.  3 weeks worth of meds set up for Austin [name].  Next home visit will be 11/18 @ 9:00 and pt agrees to same.  ACTION: Home visit completed  Mary Claudene Kennel [phone] 10/12/24  Patient Care Team: Georgina Speaks, FNP as PCP - General (General Practice) Kate Lonni CROME, MD as PCP - Cardiology (Cardiology)  Patient Active Problem List   Diagnosis Date Noted   Hyponatremia 03/18/2024   Acute on chronic systolic CHF (congestive heart failure) (HCC) 03/11/2024   Abnormal levels of other serum enzymes 03/11/2024   Anemia 03/11/2024   Rectal bleeding 03/11/2024   ABLA (acute blood loss anemia) 01/13/2024   Adrenal insufficiency 01/12/2024   Closed left hip fracture (HCC) 01/10/2024   Transient hypotension 06/05/2023   Alcohol  use disorder 06/05/2023   Hypertensive heart disease with  chronic combined systolic and diastolic congestive heart failure (HCC) 06/05/2023   Elevated brain natriuretic peptide (BNP) level 06/05/2023   Elevated LFTs 05/18/2023   Prolonged QT interval 05/18/2023   AKI (acute kidney injury) 05/17/2023   Iron deficiency anemia 09/03/2020   NICM (nonischemic cardiomyopathy) (HCC)    ETOH abuse 11/10/2018   Essential hypertension 11/10/2018   Blood in stool 11/10/2018    Current Outpatient Medications:    digoxin  (LANOXIN ) 0.125 MG tablet, Take 1 tablet (0.125 mg total) by mouth daily., Disp: 90 tablet, Rfl: 3   ferrous sulfate  325 (65 FE) MG tablet, Take 1 tablet (325 mg total) by mouth daily with breakfast., Disp: 30 tablet, Rfl: 3   furosemide  (LASIX ) 40 MG tablet, Take 1.5 tablets (60 mg total) by mouth daily., Disp: 90 tablet, Rfl: 3   hydrocortisone  (CORTEF ) 5 MG tablet, Take 1 tablet (5 mg total) by mouth daily. Start one daily after twice daily for 15 daily., Disp: 30 tablet, Rfl: 5   JARDIANCE  10 MG TABS tablet, TAKE 1 TABLET BY MOUTH DAILY BEFORE BREAKFAST., Disp: 90 tablet, Rfl: 3   loperamide (IMODIUM) 2 MG capsule, Take 2 mg by mouth every 6 (six) hours as needed for diarrhea or loose stools., Disp: , Rfl:    losartan  (COZAAR ) 25 MG tablet, Take 1 tablet (25 mg total) by  mouth daily., Disp: 90 tablet, Rfl: 3   metoprolol  succinate (TOPROL  XL) 25 MG 24 hr tablet, Take 1 tablet (25 mg total) by mouth daily., Disp: 90 tablet, Rfl: 3   senna (SENOKOT) 8.6 MG TABS tablet, Take 1 tablet (8.6 mg total) by mouth 2 (two) times daily. (Patient taking differently: Take 1 tablet (8.6 mg total) by mouth 2 (two) times daily.), Disp: 120 tablet, Rfl: 0   sildenafil (VIAGRA) 25 MG tablet, Take 2 tablets (50 mg total) by mouth as needed., Disp: 10 tablet, Rfl: 0   spironolactone  (ALDACTONE ) 25 MG tablet, Take 1 tablet (25 mg total) by mouth daily., Disp: 90 tablet, Rfl: 3   docusate sodium  (COLACE) 100 MG capsule, Take 1 capsule (100 mg total) by mouth 2  (two) times daily. (Patient not taking: Reported on 10/12/2024), Disp: 10 capsule, Rfl: 0   Multiple Vitamin (MULTIVITAMIN) tablet, Take 1 tablet by mouth daily. (Patient not taking: Reported on 10/12/2024), Disp: , Rfl:  Allergies  Allergen Reactions   Latex Swelling and Rash     Social History   Socioeconomic History   Marital status: Married    Spouse name: Not on file   Number of children: 2   Years of education: Not on file   Highest education level: Bachelor's degree (e.g., BA, AB, BS)  Occupational History   Not on file  Tobacco Use   Smoking status: Never   Smokeless tobacco: Never  Vaping Use   Vaping status: Never Used  Substance and Sexual Activity   Alcohol  use: Not Currently    Comment: 1-3 beers daily   Drug use: No   Sexual activity: Yes  Other Topics Concern   Not on file  Social History Narrative   Not on file   Social Drivers of Health   Financial Resource Strain: Low Risk  (02/11/2024)   Overall Financial Resource Strain (CARDIA)    Difficulty of Paying Living Expenses: Not hard at all  Food Insecurity: No Food Insecurity (03/11/2024)   Hunger Vital Sign    Worried About Running Out of Food in the Last Year: Never true    Ran Out of Food in the Last Year: Never true  Transportation Needs: No Transportation Needs (03/11/2024)   PRAPARE - Administrator, Civil Service (Medical): No    Lack of Transportation (Non-Medical): No  Physical Activity: Insufficiently Active (02/11/2024)   Exercise Vital Sign    Days of Exercise per Week: 2 days    Minutes of Exercise per Session: 40 min  Stress: No Stress Concern Present (02/11/2024)   [employer] of Occupational Health - Occupational Stress Questionnaire    Feeling of Stress : Not at all  Social Connections: Socially Integrated (03/11/2024)   Social Connection and Isolation Panel    Frequency of Communication with Friends and Family: Never    Frequency of Social Gatherings with Friends and Family:  More than three times a week    Attends Religious Services: More than 4 times per year    Active Member of [employer] or Organizations: Yes    Attends Banker Meetings: More than 4 times per year    Marital Status: Married  Catering Manager Violence: Not At Risk (03/11/2024)   Humiliation, Afraid, Rape, and Kick questionnaire    Fear of Current or Ex-Partner: No    Emotionally Abused: No    Physically Abused: No    Sexually Abused: No    Physical Exam  Future Appointments  Date Time Provider Department Center  10/29/2024  9:00 AM [facility] ECHO OP 1 [facility]  11/03/2024  9:15 AM Inocencio Soyla Lunger, MD [facility]&V  11/26/2024 11:00 AM [facility] PA/NP [facility] None  03/16/2025  2:40 PM [facility]-ANNUAL WELLNESS VISIT [facility] [phone]

## 2024-10-13 ENCOUNTER — Other Ambulatory Visit (HOSPITAL_COMMUNITY): Payer: Self-pay | Admitting: Cardiology

## 2024-10-26 ENCOUNTER — Other Ambulatory Visit (HOSPITAL_COMMUNITY): Payer: Self-pay | Admitting: Emergency Medicine

## 2024-10-26 NOTE — Progress Notes (Unsigned)
 Refill Hydrocortisone , Dig

## 2024-10-29 ENCOUNTER — Ambulatory Visit (HOSPITAL_COMMUNITY)
Admission: RE | Admit: 2024-10-29 | Discharge: 2024-10-29 | Disposition: A | Discharge status: Home / Self Care | Source: Ambulatory Visit | Attending: Nurse Practitioner | Admitting: Nurse Practitioner

## 2024-10-29 DIAGNOSIS — I5022 Chronic systolic (congestive) heart failure: Secondary | ICD-10-CM | POA: Diagnosis not present

## 2024-10-29 DIAGNOSIS — I517 Cardiomegaly: Secondary | ICD-10-CM | POA: Insufficient documentation

## 2024-10-29 DIAGNOSIS — I081 Rheumatic disorders of both mitral and tricuspid valves: Secondary | ICD-10-CM | POA: Diagnosis present

## 2024-10-29 LAB — ECHOCARDIOGRAM COMPLETE
S' Lateral: 4.5 cm
Single Plane A4C EF: 22.6 %

## 2024-10-29 NOTE — Progress Notes (Signed)
  Echocardiogram 2D Echocardiogram has been performed.  Austin [name] 10/29/2024, 9:54 AM

## 2024-11-02 NOTE — Progress Notes (Signed)
" °  Electrophysiology Office Note:   Date:  11/02/2024  ID:  Austin [name], DOB 12-16-1955, MRN [id number]  Primary Cardiologist: Lonni LITTIE Nanas, MD Primary Heart Failure: None Electrophysiologist: None      History of Present Illness:   Austin [name] is a 68 y.o. male with h/o chronic systolic heart failure, alcohol  abuse, hypertension seen today for routine electrophysiology followup.   Discussed the use of AI scribe software for clinical note transcription with the patient, who gave verbal consent to proceed.  History of Present Illness Austin [name] is a 68 year old male who presents for evaluation of a potential defibrillator placement. He is accompanied by his wife. He was referred by Dr. Rolan for evaluation of his heart rhythm issue related to alcohol  consumption.  His alcohol  consumption is variable, often increasing during social events such as football or basketball games. He typically drinks more on weekends, consuming two forties on Friday, Saturday, and sometimes Sunday, but abstains from alcohol  Monday through Thursday due to work commitments.  He does not report significant fatigue, shortness of breath, or palpitations, but notes that he has slowed down with age and that his breathing improves after starting work in the afternoon. He works in [employer] and mentions that his breathing improves after starting work in the afternoon.  He recalls meeting Dr. Fernande about a year ago, who discussed his condition and the potential need for a defibrillator.  he denies chest pain, palpitations, dyspnea, PND, orthopnea, nausea, vomiting, dizziness, syncope, edema, weight gain, or early satiety.   Review of systems complete and found to be negative unless listed in HPI.   EP Information / Studies Reviewed:    EKG is not ordered today. EKG from 09/14/2024 reviewed which showed sinus rhythm, IVCD        Risk Assessment/Calculations:            Physical Exam:    VS:  There were no vitals taken for this visit.   Wt Readings from Last 3 Encounters:  10/12/24 147 lb (66.7 kg)  09/14/24 150 lb (68 kg)  09/09/24 152 lb 12.8 oz (69.3 kg)     GEN: Well nourished, well developed in no acute distress NECK: No JVD; No carotid bruits CARDIAC: Regular rate and rhythm, no murmurs, rubs, gallops RESPIRATORY:  Clear to auscultation without rales, wheezing or rhonchi  ABDOMEN: Soft, non-tender, non-distended EXTREMITIES:  No edema; No deformity   ASSESSMENT AND PLAN:    1.  Chronic static heart failure: Due to nonischemic cardiomyopathy, potentially related to alcohol .  He is apparently drinking 4 drinks a week at this point.  On medical therapy per heart failure cardiology.  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  Ejection fraction remains below 35%.  He is still drinking alcohol .  I Zaira Iacovelli discuss his candidacy for a defibrillator implant with his heart failure cardiologist.  If he thinks that it is reasonable, we Caylee Vlachos plan on CRT-D implant.  Explained risks, benefits, and alternatives to ICD implantation, including but not limited to bleeding, infection, pneumothorax, pericardial effusion, lead dislodgement, heart attack, stroke, or death.  Pt verbalized understanding and agrees to proceed.  2.  Alcohol  abuse: Long history of heavy alcohol .  Patient states that he has cut back.   Follow up with EP Team decision on ICD implant   Signed, Kynesha Guerin Gladis Norton, MD  "

## 2024-11-03 ENCOUNTER — Ambulatory Visit: Attending: Cardiology | Admitting: Cardiology

## 2024-11-03 ENCOUNTER — Encounter: Payer: Self-pay | Admitting: Cardiology

## 2024-11-03 VITALS — BP 130/90 | HR 102 | Ht 69.0 in | Wt 153.1 lb

## 2024-11-03 DIAGNOSIS — I5022 Chronic systolic (congestive) heart failure: Secondary | ICD-10-CM

## 2024-11-03 DIAGNOSIS — F1090 Alcohol use, unspecified, uncomplicated: Secondary | ICD-10-CM

## 2024-11-16 ENCOUNTER — Telehealth (HOSPITAL_COMMUNITY): Payer: Self-pay | Admitting: Emergency Medicine

## 2024-11-19 ENCOUNTER — Telehealth (HOSPITAL_COMMUNITY): Payer: Self-pay | Admitting: Emergency Medicine

## 2024-11-19 NOTE — Telephone Encounter (Signed)
 Spoke with Mrs. Waddell and she advised [place] was not going to be available to be seen this morning.  Scheduled home visit for Tues. 12/16 @ 9:00.    Mary Sharps, EMT-Paramedic [phone] 11/19/2024

## 2024-11-22 ENCOUNTER — Telehealth: Payer: Self-pay | Admitting: *Deleted

## 2024-11-22 DIAGNOSIS — I428 Other cardiomyopathies: Secondary | ICD-10-CM

## 2024-11-22 DIAGNOSIS — Z01812 Encounter for preprocedural laboratory examination: Secondary | ICD-10-CM

## 2024-11-22 DIAGNOSIS — I447 Left bundle-branch block, unspecified: Secondary | ICD-10-CM

## 2024-11-22 DIAGNOSIS — I5022 Chronic systolic (congestive) heart failure: Secondary | ICD-10-CM

## 2024-11-22 NOTE — Telephone Encounter (Signed)
 Spoke to wife, aware Dr. Inocencio discussed tx plan with advanced heart failure clinic -- advised to proceed with scheduling BiV ICD implant. Scheduled for 12/21/2024. Pt will stop by the [facility] office between 12/13/24 - 12/17/24 for pre procedure blood work and pick up procedure instructions & scrub. Pt agreeable to above plan.

## 2024-11-23 ENCOUNTER — Other Ambulatory Visit (HOSPITAL_COMMUNITY): Payer: Self-pay | Admitting: Emergency Medicine

## 2024-11-23 NOTE — Progress Notes (Signed)
 Paramedicine Encounter    Patient ID: Austin [name], male    DOB: 1956/06/23, 68 y.o.   MRN: [id number]   Complaints NONE  Assessment A&O x 4, skin W&D w/ good color.  Denies chest pain or SOB.  Lung sounds w/ some ronchi noted bilat.  Pt says he has a productive cough and has not run a fever.  Advised him that Robitussin would be an approved choice for him to take for his symptoms.   Med box reconciled   Compliance with meds NO  Pill box filled x 2 weeks  Refills needed NONE  Meds changes since last visit Entresto  discontinued     Social changes NONE   BP 120/80 (BP Location: Left Arm, Patient Position: Sitting, Cuff Size: Normal)   Pulse 83  Weight yesterday- Last visit weight-  Austin [name] has clinic visit on Friday.  Advised him that I will attend clinic visit and recommended he bring his meds and pill box so that I can reconcile w/ any med changes.  ACTION: Home visit completed  Austin [name] [phone] 12/03/2024  Patient Care Team: Georgina Speaks, FNP as PCP - General (General Practice) Rolan Ezra RAMAN, MD as PCP - Advanced Heart Failure (Cardiology) Rolan Ezra RAMAN, MD as Consulting Physician (Cardiology)  Patient Active Problem List   Diagnosis Date Noted   Hypotension 11/30/2024   Syncope and collapse 11/30/2024   Hyponatremia 03/18/2024   Acute on chronic systolic CHF (congestive heart failure) (HCC) 03/11/2024   Abnormal levels of other serum enzymes 03/11/2024   Anemia 03/11/2024   Rectal bleeding 03/11/2024   ABLA (acute blood loss anemia) 01/13/2024   Adrenal insufficiency 01/12/2024   Closed left hip fracture (HCC) 01/10/2024   Transient hypotension 06/05/2023   Alcohol  use disorder 06/05/2023   Hypertensive 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 (HCC) 06/05/2023   Elevated brain natriuretic peptide (BNP) level 06/05/2023   Elevated LFTs 05/18/2023   Prolonged QT interval 05/18/2023    AKI (acute kidney injury) 05/17/2023   Iron deficiency anemia 09/03/2020   NICM (nonischemic cardiomyopathy) (HCC)    ETOH abuse 11/10/2018   Essential hypertension 11/10/2018   Blood in stool 11/10/2018   Current Medications[1] Allergies[2]   Social History   Socioeconomic History   Marital status: Married    Spouse name: Not on file   Number of children: 2   Years of education: Not on file   Highest education level: Bachelor's degree (e.g., BA, AB, BS)  Occupational History   Not on file  Tobacco Use   Smoking status: Never   Smokeless tobacco: Never  Vaping Use   Vaping status: Never Used  Substance and Sexual Activity   Alcohol  use: Not Currently    Comment: 1-3 beers daily   Drug use: No   Sexual activity: Yes  Other Topics Concern   Not on file  Social History Narrative   Not on file   Social Drivers of Health   Tobacco Use: Low Risk (11/26/2024)   Patient History    Smoking Tobacco Use: Never    Smokeless Tobacco Use: Never    Passive Exposure: Not on file  Financial Resource Strain: Low Risk (02/11/2024)   Overall Financial Resource Strain (CARDIA)    Difficulty of Paying Living Expenses: Not hard at all  Food Insecurity: No Food Insecurity (03/11/2024)   Hunger Vital Sign    Worried About Running Out of Food in the Last Year: Never true  Ran Out of Food in the Last Year: Never true  Transportation Needs: No Transportation Needs (03/11/2024)   PRAPARE - Administrator, Civil Service (Medical): No    Lack of Transportation (Non-Medical): No  Physical Activity: Insufficiently Active (02/11/2024)   Exercise Vital Sign    Days of Exercise per Week: 2 days    Minutes of Exercise per Session: 40 min  Stress: No Stress Concern Present (02/11/2024)   [employer] of Occupational Health - Occupational Stress Questionnaire    Feeling of Stress : Not at all  Social Connections: Socially Integrated (03/11/2024)   Social Connection and Isolation Panel     Frequency of Communication with Friends and Family: Never    Frequency of Social Gatherings with Friends and Family: More than three times a week    Attends Religious Services: More than 4 times per year    Active Member of Clubs or Organizations: Yes    Attends Banker Meetings: More than 4 times per year    Marital Status: Married  Catering Manager Violence: Not At Risk (03/11/2024)   Humiliation, Afraid, Rape, and Kick questionnaire    Fear of Current or Ex-Partner: No    Emotionally Abused: No    Physically Abused: No    Sexually Abused: No  Depression (PHQ2-9): Low Risk (02/11/2024)   Depression (PHQ2-9)    PHQ-2 Score: 0  Alcohol  Screen: Low Risk (02/11/2024)   Alcohol  Screen    Last Alcohol  Screening Score (AUDIT): 0  Housing: Low Risk (03/11/2024)   Housing Stability Vital Sign    Unable to Pay for Housing in the Last Year: No    Number of Times Moved in the Last Year: 0    Homeless in the Last Year: No  Utilities: Not At Risk (03/11/2024)   AHC Utilities    Threatened with loss of utilities: No  Health Literacy: Adequate Health Literacy (02/11/2024)   B1300 Health Literacy    Frequency of need for help with medical instructions: Never    Physical Exam      Future Appointments  Date Time Provider Department Center  12/20/2024  2:00 PM [facility] PA/NP [facility] None  03/16/2025  2:40 PM [facility]-ANNUAL WELLNESS VISIT [facility] [address]            [1]  Current Outpatient Medications:    digoxin  (LANOXIN ) 0.125 MG tablet, Take 1 tablet (0.125 mg total) by mouth daily., Disp: 90 tablet, Rfl: 3   ferrous sulfate  325 (65 FE) MG tablet, Take 1 tablet (325 mg total) by mouth daily with breakfast., Disp: 30 tablet, Rfl: 3   furosemide  (LASIX ) 40 MG tablet, Take 1.5 tablets (60 mg total) by mouth daily., Disp: 90 tablet, Rfl: 3   hydrocortisone  (CORTEF ) 5 MG tablet, Take 1 tablet (5 mg total) by mouth daily. Start one daily after twice daily for 15 daily.,  Disp: 30 tablet, Rfl: 5   JARDIANCE  10 MG TABS tablet, TAKE 1 TABLET BY MOUTH DAILY BEFORE BREAKFAST., Disp: 90 tablet, Rfl: 3   losartan  (COZAAR ) 25 MG tablet, Take 1 tablet (25 mg total) by mouth daily., Disp: 90 tablet, Rfl: 3   metoprolol  succinate (TOPROL  XL) 25 MG 24 hr tablet, Take 1 tablet (25 mg total) by mouth daily., Disp: 90 tablet, Rfl: 3   sildenafil  (VIAGRA ) 25 MG tablet, TAKE 2 TABLETS (50 MG TOTAL) BY MOUTH AS NEEDED., Disp: 10 tablet, Rfl: 0   spironolactone  (ALDACTONE ) 25 MG tablet, Take 1 tablet (25  mg total) by mouth daily., Disp: 90 tablet, Rfl: 3   apixaban  (ELIQUIS ) 5 MG TABS tablet, Take 1 tablet (5 mg total) by mouth 2 (two) times daily., Disp: 180 tablet, Rfl: 3   docusate sodium  (COLACE) 100 MG capsule, Take 1 capsule (100 mg total) by mouth 2 (two) times daily. (Patient not taking: Reported on 11/26/2024), Disp: 10 capsule, Rfl: 0   loperamide (IMODIUM) 2 MG capsule, Take 2 mg by mouth every 6 (six) hours as needed for diarrhea or loose stools., Disp: , Rfl:    magnesium  oxide (MAG-OX) 400 MG tablet, Take 1 tablet (400 mg total) by mouth daily for 14 days., Disp: 14 tablet, Rfl: 0   Multiple Vitamin (MULTIVITAMIN) tablet, Take 1 tablet by mouth daily. (Patient not taking: Reported on 11/26/2024), Disp: , Rfl:    senna (SENOKOT) 8.6 MG TABS tablet, Take 1 tablet (8.6 mg total) by mouth 2 (two) times daily. (Patient not taking: Reported on 11/26/2024), Disp: 120 tablet, Rfl: 0 [2]  Allergies Allergen Reactions   Latex Swelling and Rash

## 2024-11-24 NOTE — Progress Notes (Incomplete)
 ADVANCED HF CLINIC NOTE   PCP: Georgina Speaks, FNP HF Cardiology: Dr. Rolan  Reason for Visit: Heart Failure Follow-up HPI: 68 y.o. with history of ETOH abuse HTN, chronic systolic CHF. nonischemic cardiomyopathy.   HF dates back to 2021. Echo 3/21 EF < 20% with severe RV dysfunction.  RHC/LHC in 3/21 showed no CAD but elevated R > L filling pressures, preserved cardiac index.  Cardiac MRI 4/21: LVEF 14%, RVEF 16%, no LGE  EF had improved to 50-55% in 5/23.  Echo (8/24) showed EF 25-30%, GIDD, RV normal. Suspected drop in EF 2/2 med noncompliance and ETOH.   Echo (12/24) showed EF 25-30.   Seen by EP in 1/25 CRT-D evaluation. EP felt that there might not be benefit of CRT-D. Did appear that LBBB is rate dependent, may better benefit from reduced rate.   He was readmitted 2/1-01/15/24 with fall resulting in left hip fracture s/p left hip arthroplasty. Course c/b anemia and hypotension. Workup suggested adrenal insufficiency. Discharged home on hydrocortisone . Entresto , Toprol  XL and spironolactone  were discontinued.  Seen in clinic 03/11/24 and sent to ED for admission due to concern for low-output CHF. Had missed some medications while caring for his wife after a recent surgery.  Lactic acid elevated at 4.7. He diuresed with milrinone  support. GDMT titrated. He was enrolled in [facility] prior to discharge.  Echo in 4/25 showed EF 20-25%.    He returns for followup of CHF.  Still drinking some ETOH, says only about 4 beers/week when he is watching football. He has gone back to work part time in the [employer].  He denies exertional dyspnea.  No orthopnea/PND.  No chest pain.  BP runs low but he denies lightheadedness.  He has been out of digoxin  for about a week.   ECG (personally reviewed): NSR, IVCD 154 msec  Labs (5/25): K 3.8, creatinine 1.09, BNP 300  PMH: 1. Prostate cancer 2. HTN: BP now low.  3. Fe deficiency anemia 4. ETOH abuse 5. GERD 6. Chronic systolic CHF:  Nonischemic cardiomyopathy. ?Due to ETOH abuse.  - Echo 3/21 with EF < 20%, severe global HK, severely dilated and severely dysfunctional RV, severe biatrial enlargement.  - RHC/LHC (3/21): No CAD; mean RA 15, PA 51/24, mean PCWP 19, CI 3.96.  - HIV negative 4/21 - Cardiac MRI (4/21): LV EF 14%, moderate LV dilation, mild RV dilation with severely decreased systolic function, moderate pericardial effusion, no LGE noted but images difficult due to recent Fe infusion.  - Echo (8/21): EF 30-35%, diffuse hypokinesis, normal RV, no pericardial fluid.  - Echo (10/21): EF 45-50%, diffuse hypokinesis, normal RV.  - Echo (3/22): EF 45-50%, normal RV, PASP 34 mmHg.  - TEE (5/23): EF 50-55%, normal RV, trivial MR, mobile RA structure seen on TTE was [facility].  - Echo (8/24): EF 25-30%, GIDD, RV normal  - Echo (12/24): 25-30%, mod reduced RV, mild MR, mid TR. - Echo (4/25): EF 20-25%, global hypokinesis, moderate RV enlargement with moderate RV systolic dysfunction, no MR.   FH: Sister with CHF, brother with pacemaker, mother with CHF.   SH: Married, lives in [place], heavy ETOH in the past has now cut back, no drugs.  He was a estate agent, now works part-time at [employer] in [employer].   ROS: All systems reviewed and negative except as per HPI.   Current Outpatient Medications  Medication Sig Dispense Refill   digoxin  (LANOXIN ) 0.125 MG tablet Take 1 tablet (0.125 mg  total) by mouth daily. 90 tablet 3   docusate sodium  (COLACE) 100 MG capsule Take 1 capsule (100 mg total) by mouth 2 (two) times daily. (Patient not taking: Reported on 11/23/2024) 10 capsule 0   ferrous sulfate  325 (65 FE) MG tablet Take 1 tablet (325 mg total) by mouth daily with breakfast. 30 tablet 3   furosemide  (LASIX ) 40 MG tablet Take 1.5 tablets (60 mg total) by mouth daily. 90 tablet 3   hydrocortisone  (CORTEF ) 5 MG tablet Take 1 tablet (5 mg total) by mouth daily. Start one daily after twice daily for 15 daily.  30 tablet 5   JARDIANCE  10 MG TABS tablet TAKE 1 TABLET BY MOUTH DAILY BEFORE BREAKFAST. 90 tablet 3   loperamide (IMODIUM) 2 MG capsule Take 2 mg by mouth every 6 (six) hours as needed for diarrhea or loose stools. (Patient not taking: Reported on 11/23/2024)     losartan  (COZAAR ) 25 MG tablet Take 1 tablet (25 mg total) by mouth daily. 90 tablet 3   metoprolol  succinate (TOPROL  XL) 25 MG 24 hr tablet Take 1 tablet (25 mg total) by mouth daily. 90 tablet 3   Multiple Vitamin (MULTIVITAMIN) tablet Take 1 tablet by mouth daily. (Patient not taking: Reported on 11/23/2024)     sacubitril -valsartan  (ENTRESTO ) 97-103 MG Take 1 tablet by mouth 2 (two) times daily. (Patient not taking: Reported on 11/23/2024)     senna (SENOKOT) 8.6 MG TABS tablet Take 1 tablet (8.6 mg total) by mouth 2 (two) times daily. (Patient not taking: Reported on 11/23/2024) 120 tablet 0   sildenafil  (VIAGRA ) 25 MG tablet TAKE 2 TABLETS (50 MG TOTAL) BY MOUTH AS NEEDED. 10 tablet 0   spironolactone  (ALDACTONE ) 25 MG tablet Take 1 tablet (25 mg total) by mouth daily. 90 tablet 3   No current facility-administered medications for this visit.   Wt Readings from Last 3 Encounters:  11/03/24 69.4 kg (153 lb 1.6 oz)  10/12/24 66.7 kg (147 lb)  09/14/24 68 kg (150 lb)   There were no vitals taken for this visit.  Physical Exam General: NAD Neck: No JVD, no thyromegaly or thyroid nodule.  Lungs: Clear to auscultation bilaterally with normal respiratory effort. CV: Lateral PMI.  Heart regular S1/S2, no S3/S4, no murmur.  No peripheral edema.  No carotid bruit.  Normal pedal pulses.  Abdomen: Soft, nontender, no hepatosplenomegaly, no distention.  Skin: Intact without lesions or rashes.  Neurologic: Alert and oriented x 3.  Psych: Normal affect. Extremities: No clubbing or cyanosis.  HEENT: Normal.   Assessment/Plan: 1. Chronic systolic CHF: NICM, possibly due to ETOH.  Echo 3/21 EF 20%, moderately decreased RV.  RHC/LHC  3/21 no significant coronary disease, preserved cardiac output, R>L heart failure.  Cardiac MRI in 4/21 showed EF 14%, severe RV dysfunction; no LGE but difficult LGE images.  TEE in 5/23 showed EF up to 50-55%, normal RV, trivial MR, mobile RA structure seen on TTE was [facility]. Echo in 8/24 with drop in EF to 25-30% in setting of heavy ETOH intake and noncompliance with meds. Echo 12/24 EF 25-30% on GDMT. Echo 4/25 with EF 20-25%, RV mod reduced systolic function. 4/25 admission for low-output HF requiring inotrope support. He says that he has cut back on ETOH. NYHA class I-II, not volume overloaded on exam. He does not have much BP room for medication titration. - I will arrange for repeat echo on GDMT and hopefully on minimal ETOH.  If EF remains low, would like  him to see EP again to consider CRT-D (has ECGs with wide QRS, varying nonspecific IVCD and LBBB).  - Continue Toprol  XL 25 mg daily. - Continue Lasix  60 mg daily.  - Continue losartan  25 mg daily, no BP room to transition to Entresto .  - Restart digoxin  0.125 mg daily - Continue Jardiance  10 mg daily - Increase spironolactone  to 25 mg daily with BMET/BNP today and BMET in 10 days.  - Doing better with med compliance with paramedicine.  - May not be a good candidate for VAD with RV dysfunction.  2. LBBB/IVCD: If repeat echo shows that EF is still low despite medication compliance and minimal ETOH, would favor CRT-D.  3. ETOH abuse: Long history of heavy ETOH. This may be the cause of his cardiomyopathy.  He says that he has cut back.  I would like to see him stop completely.  4. Hyponatremia: chronic. Na typically upper 120s-130s. - May be related to ETOH abuse.  5. Adrenal insufficiency: Low am cortisol during 2/25 admission. He was placed on hydrocortisone .  - Needs endocrinology appt, will refer.  6. Erectile dysfunction: Will refill low dose Viagra .   Follow up 2 months with APP.   I spent 32 minutes reviewing records,  interviewing/examining patient, and managing orders.   Austin HERO Davis, FNP 11/24/2024

## 2024-11-25 ENCOUNTER — Telehealth (HOSPITAL_COMMUNITY): Payer: Self-pay

## 2024-11-25 NOTE — Telephone Encounter (Signed)
 Called and spoke to pt's wife Lorie to confirm/remind patient of their appointment at the [facility] on 11/26/24.   Appointment:   [x] Confirmed  [] Left mess   [] No answer/No voice mail  [] VM Full/unable to leave message  [] Phone not in service  Patient reminded to bring all medications and/or complete list.  Confirmed patient has transportation. Gave directions, instructed to utilize valet parking.

## 2024-11-26 ENCOUNTER — Ambulatory Visit (HOSPITAL_COMMUNITY): Payer: Self-pay | Admitting: Family Medicine

## 2024-11-26 ENCOUNTER — Other Ambulatory Visit (HOSPITAL_COMMUNITY): Payer: Self-pay | Admitting: Emergency Medicine

## 2024-11-26 ENCOUNTER — Telehealth (HOSPITAL_COMMUNITY): Payer: Self-pay

## 2024-11-26 ENCOUNTER — Encounter (HOSPITAL_COMMUNITY): Payer: Self-pay

## 2024-11-26 ENCOUNTER — Inpatient Hospital Stay (HOSPITAL_COMMUNITY): Admission: RE | Admit: 2024-11-26 | Discharge: 2024-11-26 | Attending: Family Medicine

## 2024-11-26 ENCOUNTER — Ambulatory Visit (HOSPITAL_COMMUNITY)
Admission: RE | Admit: 2024-11-26 | Discharge: 2024-11-26 | Disposition: A | Source: Ambulatory Visit | Attending: Cardiology | Admitting: Cardiology

## 2024-11-26 ENCOUNTER — Other Ambulatory Visit (HOSPITAL_COMMUNITY): Payer: Self-pay

## 2024-11-26 ENCOUNTER — Encounter (HOSPITAL_COMMUNITY): Payer: Self-pay | Admitting: Emergency Medicine

## 2024-11-26 ENCOUNTER — Other Ambulatory Visit (HOSPITAL_COMMUNITY): Payer: Self-pay | Admitting: Cardiology

## 2024-11-26 ENCOUNTER — Emergency Department (HOSPITAL_COMMUNITY)
Admission: EM | Admit: 2024-11-26 | Discharge: 2024-11-26 | Disposition: A | Discharge status: Home / Self Care | Attending: Emergency Medicine | Admitting: Emergency Medicine

## 2024-11-26 VITALS — BP 120/64 | HR 47 | Ht 70.0 in | Wt 138.0 lb

## 2024-11-26 DIAGNOSIS — I447 Left bundle-branch block, unspecified: Secondary | ICD-10-CM

## 2024-11-26 DIAGNOSIS — F1010 Alcohol abuse, uncomplicated: Secondary | ICD-10-CM

## 2024-11-26 DIAGNOSIS — I5022 Chronic systolic (congestive) heart failure: Secondary | ICD-10-CM

## 2024-11-26 DIAGNOSIS — E871 Hypo-osmolality and hyponatremia: Secondary | ICD-10-CM | POA: Insufficient documentation

## 2024-11-26 DIAGNOSIS — E2740 Unspecified adrenocortical insufficiency: Secondary | ICD-10-CM | POA: Insufficient documentation

## 2024-11-26 DIAGNOSIS — Z7984 Long term (current) use of oral hypoglycemic drugs: Secondary | ICD-10-CM | POA: Diagnosis not present

## 2024-11-26 DIAGNOSIS — I4891 Unspecified atrial fibrillation: Secondary | ICD-10-CM | POA: Diagnosis not present

## 2024-11-26 DIAGNOSIS — Z8249 Family history of ischemic heart disease and other diseases of the circulatory system: Secondary | ICD-10-CM | POA: Diagnosis not present

## 2024-11-26 DIAGNOSIS — Z7901 Long term (current) use of anticoagulants: Secondary | ICD-10-CM | POA: Diagnosis not present

## 2024-11-26 DIAGNOSIS — Z91040 Latex allergy status: Secondary | ICD-10-CM | POA: Insufficient documentation

## 2024-11-26 DIAGNOSIS — R197 Diarrhea, unspecified: Secondary | ICD-10-CM | POA: Insufficient documentation

## 2024-11-26 DIAGNOSIS — I110 Hypertensive heart disease with heart failure: Secondary | ICD-10-CM | POA: Diagnosis present

## 2024-11-26 DIAGNOSIS — Z79899 Other long term (current) drug therapy: Secondary | ICD-10-CM | POA: Diagnosis not present

## 2024-11-26 DIAGNOSIS — I509 Heart failure, unspecified: Secondary | ICD-10-CM | POA: Diagnosis not present

## 2024-11-26 DIAGNOSIS — I428 Other cardiomyopathies: Secondary | ICD-10-CM | POA: Diagnosis not present

## 2024-11-26 LAB — BASIC METABOLIC PANEL WITH GFR
Anion gap: 20 — ABNORMAL HIGH (ref 5–15)
Anion gap: 17 — ABNORMAL HIGH (ref 5–15)
BUN: 8 mg/dL (ref 8–23)
BUN: 9 mg/dL (ref 8–23)
CO2: 18 mmol/L — ABNORMAL LOW (ref 22–32)
CO2: 25 mmol/L (ref 22–32)
Calcium: 9.2 mg/dL (ref 8.9–10.3)
Calcium: 8.9 mg/dL (ref 8.9–10.3)
Chloride: 82 mmol/L — ABNORMAL LOW (ref 98–111)
Chloride: 81 mmol/L — ABNORMAL LOW (ref 98–111)
Creatinine, Ser: 1.17 mg/dL (ref 0.61–1.24)
Creatinine, Ser: 1.15 mg/dL (ref 0.61–1.24)
GFR, Estimated: >60 mL/min
GFR, Estimated: >60 mL/min
Glucose, Bld: 78 mg/dL (ref 70–99)
Glucose, Bld: 83 mg/dL (ref 70–99)
Potassium: 4.5 mmol/L (ref 3.5–5.1)
Potassium: 3.6 mmol/L (ref 3.5–5.1)
Sodium: 120 mmol/L — ABNORMAL LOW (ref 135–145)
Sodium: 123 mmol/L — ABNORMAL LOW (ref 135–145)

## 2024-11-26 LAB — CBC
HCT: 42.5 % (ref 39.0–52.0)
HCT: 40.9 % (ref 39.0–52.0)
Hemoglobin: 15.2 g/dL (ref 13.0–17.0)
Hemoglobin: 14.6 g/dL (ref 13.0–17.0)
MCH: 33.5 pg (ref 26.0–34.0)
MCH: 33.3 pg (ref 26.0–34.0)
MCHC: 35.8 g/dL (ref 30.0–36.0)
MCHC: 35.7 g/dL (ref 30.0–36.0)
MCV: 93.6 fL (ref 80.0–100.0)
MCV: 93.2 fL (ref 80.0–100.0)
Platelets: 167 K/uL (ref 150–400)
Platelets: 152 K/uL (ref 150–400)
RBC: 4.54 MIL/uL (ref 4.22–5.81)
RBC: 4.39 MIL/uL (ref 4.22–5.81)
RDW: 14.5 % (ref 11.5–15.5)
RDW: 14.4 % (ref 11.5–15.5)
WBC: 5.8 K/uL (ref 4.0–10.5)
WBC: 6.2 K/uL (ref 4.0–10.5)
nRBC: 0 % (ref 0.0–0.2)
nRBC: 0 % (ref 0.0–0.2)

## 2024-11-26 LAB — MAGNESIUM: Magnesium: 1.4 mg/dL — ABNORMAL LOW (ref 1.7–2.4)

## 2024-11-26 LAB — PRO BRAIN NATRIURETIC PEPTIDE
Pro Brain Natriuretic Peptide: 13098 pg/mL — ABNORMAL HIGH
Pro Brain Natriuretic Peptide: 8592 pg/mL — ABNORMAL HIGH

## 2024-11-26 LAB — DIGOXIN LEVEL: Digoxin Level: 1.4 ng/mL (ref 0.8–2.0)

## 2024-11-26 LAB — HEPATIC FUNCTION PANEL
ALT: 71 U/L — ABNORMAL HIGH (ref 0–44)
AST: 106 U/L — ABNORMAL HIGH (ref 15–41)
Albumin: 3.5 g/dL (ref 3.5–5.0)
Alkaline Phosphatase: 177 U/L — ABNORMAL HIGH (ref 38–126)
Bilirubin, Direct: 0.6 mg/dL — ABNORMAL HIGH (ref 0.0–0.2)
Indirect Bilirubin: 0.8 mg/dL (ref 0.3–0.9)
Total Bilirubin: 1.4 mg/dL — ABNORMAL HIGH (ref 0.0–1.2)
Total Protein: 7.1 g/dL (ref 6.5–8.1)

## 2024-11-26 LAB — OSMOLALITY: Osmolality: 267 mosm/kg — ABNORMAL LOW (ref 275–295)

## 2024-11-26 MED ORDER — APIXABAN 5 MG PO TABS: 5.0000 mg | ORAL_TABLET | Freq: Two times a day (BID) | ORAL | 3 refills | Status: AC

## 2024-11-26 MED ORDER — SODIUM CHLORIDE 0.9 % IV BOLUS
500.0000 mL | Freq: Once | INTRAVENOUS | Status: AC
  Administered 2024-11-26: 500 mL via INTRAVENOUS

## 2024-11-26 MED ORDER — MAGNESIUM OXIDE -MG SUPPLEMENT 400 (240 MG) MG PO TABS
400.0000 mg | ORAL_TABLET | Freq: Every day | ORAL | Status: DC
  Administered 2024-11-26: 400 mg via ORAL
  Filled 2024-11-26: qty 1

## 2024-11-26 MED ORDER — MAGNESIUM OXIDE 400 MG PO TABS: 400.0000 mg | ORAL_TABLET | Freq: Every day | ORAL | 0 refills | Status: DC

## 2024-11-26 NOTE — Progress Notes (Signed)
 Paramedicine Encounter   Patient ID: Austin [name] , male,   DOB: [birth year],68 y.o.,  MRN: [id number]   Met patient in clinic today with provider.   Tzphyu@ clinic-138lb B/P-120/64 P-47 [id number]   Med changes today (if any) : Start Eliquis  5mg  BID Zio patch applied- new onset of Afib   Mary Sharps, EMT-Paramedic [phone] 12/03/2024

## 2024-11-26 NOTE — ED Provider Notes (Signed)
 " [place] [facility] Provider Note   CSN: [id number] Arrival date & time: 11/26/24  1750     Patient presents with: hyponatremia and Hypotension   Austin [name] is a 68 y.o. male with a history of end-stage congestive heart failure, EF less than 20%, severe right ventricular dysfunction, no coronary disease on left heart catheterization 2021, presented to ED with concern for hyponatremia.  Patient had recently recovered from GI type illness, according his wife, really had a poor appetite was having a lot of diarrhea not drinking much for a few days.  He had labs done this morning which showed sodium 120, anion gap of 20.  He has chronic hyponatremia but this was significantly lower.  His average sodium runs 126-132.   HPI     Prior to Admission medications  Medication Sig Start Date End Date Taking? Authorizing Provider  magnesium  oxide (MAG-OX) 400 MG tablet Take 1 tablet (400 mg total) by mouth daily for 14 days. 11/26/24 12/10/24 Yes Chasin Findling, Donnice PARAS, MD  apixaban (ELIQUIS) 5 MG TABS tablet Take 1 tablet (5 mg total) by mouth 2 (two) times daily. 11/26/24   [place], Harlene HERO, FNP  digoxin  (LANOXIN ) 0.125 MG tablet Take 1 tablet (0.125 mg total) by mouth daily. 03/26/24   Colletta Manuelita Garre, PA-C  docusate sodium  (COLACE) 100 MG capsule Take 1 capsule (100 mg total) by mouth 2 (two) times daily. Patient not taking: Reported on 11/26/2024 01/15/24   Regalado, Belkys A, MD  ferrous sulfate  325 (65 FE) MG tablet Take 1 tablet (325 mg total) by mouth daily with breakfast. 01/16/24   Regalado, Belkys A, MD  furosemide  (LASIX ) 40 MG tablet Take 1.5 tablets (60 mg total) by mouth daily. 03/11/24   Lee, Jordan, NP  hydrocortisone  (CORTEF ) 5 MG tablet Take 1 tablet (5 mg total) by mouth daily. Start one daily after twice daily for 15 daily. 04/01/24   Petrina Pries, NP  JARDIANCE  10 MG TABS tablet TAKE 1 TABLET BY MOUTH DAILY BEFORE BREAKFAST. 10/08/24   Rolan Ezra RAMAN, MD  loperamide (IMODIUM) 2 MG capsule Take 2 mg by mouth every 6 (six) hours as needed for diarrhea or loose stools. 10/28/23   [provider]  losartan  (COZAAR ) 25 MG tablet Take 1 tablet (25 mg total) by mouth daily. 03/26/24 12/02/24  Colletta Manuelita Garre, PA-C  metoprolol  succinate (TOPROL  XL) 25 MG 24 hr tablet Take 1 tablet (25 mg total) by mouth daily. 04/16/24   Glena Harlene HERO, FNP  Multiple Vitamin (MULTIVITAMIN) tablet Take 1 tablet by mouth daily. Patient not taking: Reported on 11/26/2024    [provider]  senna (SENOKOT) 8.6 MG TABS tablet Take 1 tablet (8.6 mg total) by mouth 2 (two) times daily. Patient not taking: Reported on 11/26/2024 01/15/24   Regalado, Owen A, MD  sildenafil  (VIAGRA ) 25 MG tablet TAKE 2 TABLETS (50 MG TOTAL) BY MOUTH AS NEEDED. 10/13/24   Rolan Ezra RAMAN, MD  spironolactone  (ALDACTONE ) 25 MG tablet Take 1 tablet (25 mg total) by mouth daily. 09/14/24 12/13/24  Rolan Ezra RAMAN, MD    Allergies: Latex    Review of Systems  Updated Vital Signs BP (!) 83/61   Pulse 80   Temp (!) 97.5 F (36.4 C)   Resp 16   SpO2 97%   Physical Exam Constitutional:      General: He is not in acute distress. HENT:     Head: Normocephalic and atraumatic.  Eyes:     Conjunctiva/sclera: Conjunctivae normal.     Pupils: Pupils are equal, round, and reactive to light.  Cardiovascular:     Rate and Rhythm: Normal rate and regular rhythm.  Pulmonary:     Effort: Pulmonary effort is normal. No respiratory distress.  Abdominal:     General: There is no distension.     Tenderness: There is no abdominal tenderness.  Skin:    General: Skin is warm and dry.  Neurological:     General: No focal deficit present.     Mental Status: He is alert. Mental status is at baseline.  Psychiatric:        Mood and Affect: Mood normal.        Behavior: Behavior normal.     (all labs ordered are listed, but only abnormal results are displayed) Labs  Reviewed  BASIC METABOLIC PANEL WITH GFR - Abnormal; Notable for the following components:      Result Value   Sodium 123 (*)    Chloride 81 (*)    Anion gap 17 (*)    All other components within normal limits  OSMOLALITY - Abnormal; Notable for the following components:   Osmolality 267 (*)    All other components within normal limits  PRO BRAIN NATRIURETIC PEPTIDE - Abnormal; Notable for the following components:   Pro Brain Natriuretic Peptide 8,592.0 (*)    All other components within normal limits  MAGNESIUM  - Abnormal; Notable for the following components:   Magnesium  1.4 (*)    All other components within normal limits  HEPATIC FUNCTION PANEL - Abnormal; Notable for the following components:   AST 106 (*)    ALT 71 (*)    Alkaline Phosphatase 177 (*)    Total Bilirubin 1.4 (*)    Bilirubin, Direct 0.6 (*)    All other components within normal limits  CBC  URINALYSIS, ROUTINE W REFLEX MICROSCOPIC    EKG: None  Radiology: No results found.   Procedures   Medications Ordered in the ED  magnesium  oxide (MAG-OX) tablet 400 mg (has no administration in time range)  sodium chloride  0.9 % bolus 500 mL (0 mLs Intravenous Stopped 11/26/24 2113)                                    Medical Decision Making Amount and/or Complexity of Data Reviewed Labs: ordered.  Risk OTC drugs.   This patient presents to the ED with concern for hyponatremia, fatigue. This involves an extensive number of treatment options, and is a complaint that carries with it a high risk of complications and morbidity.  The differential diagnosis includes hyponatremia likely related to poor oral intake, fluid losses from GI or viral illness  Co-morbidities that complicate the patient evaluation: End-stage congestive heart failure, diminished EF, at risk of volume overload.  Will need cautious fluid resuscitation  Additional history obtained from the patient's wife at bedside  External records  from outside source obtained and reviewed including cardiology records  I ordered and personally interpreted labs.  The pertinent results include: Serum awesome is mildly low, sodium 123, BNP chronically elevated but improved from priors   I ordered medication including 500 cc IV fluids, given patient's elevated anion gap and outpatient labs and his hyponatremia, I do think he is likely volume down he could use small but judicious fluids at this time.  His blood pressure tends to  run low, which I think is consistent with his heart failure.  MAP is over 65.  09/14/24 cardiology evaluation with BP 90/58 mmhg  I have reviewed the patients home medicines and have made adjustments as needed  Test Considered: Lower suspicion for acute PE in this clinical setting.  Doubt sepsis or cardiogenic shock.   After the interventions noted above, I reevaluated the patient and found that they have: improved  Patient is very adamant and very much wanting to go home.  I do see some improvement in his sodium and I think that he is probably trending in the right direction here, after his recent GI illness.  He no longer is having diarrhea and is back to eating regularly.  I think it is reasonable to be discharged home, and his wife is comfortable with this to, but advised the need to have his labs rechecked this week, and his PCPs office should arrange for this.  I did explain to him that if his sodium continues to drop at home, he will likely need to be sent back to the hospital and need to be admitted.     Final diagnoses:  Hyponatremia  Hypomagnesemia    ED Discharge Orders          Ordered    magnesium  oxide (MAG-OX) 400 MG tablet  Daily        11/26/24 2139               Cottie Donnice PARAS, MD 11/26/24 2141  "

## 2024-11-26 NOTE — Discharge Instructions (Addendum)
 Your sodium level improved here to 123.  You were given saline salt fluids in the ER.  I recommend that you make an effort to continue eating regular food like you normally do.  You need to have your primary care clinic recheck your sodium and magnesium  levels in 1 week.  Reach out to them to arrange to have this done as an outpatient.

## 2024-11-26 NOTE — ED Notes (Signed)
 Patient ambulatory to restroom prior to discharge.  Steady gait noted.

## 2024-11-26 NOTE — Telephone Encounter (Addendum)
 Spoke with his wife and advised her to bring him to either the ED or Urgent care to get evaluated. Also advised her to watch for signs of confusion and if that happens to call 911. Harlene Gainer NP aware.   ----- Message from Harlene CHRISTELLA Gainer sent at 11/26/2024  4:15 PM EST ----- Sodium is chronically low, but very low today. He needs labs repeated in ED.

## 2024-11-26 NOTE — Patient Instructions (Signed)
 START Eliquis  5 mg Twice daily  Labs done today, your results will be available in MyChart, we will contact you for abnormal readings.  Your provider has recommended that  you wear a Zio Patch for 14 days.  This monitor will record your heart rhythm for our review.  IF you have any symptoms while wearing the monitor please press the button.  If you have any issues with the patch or you notice a red or orange light on it please call the company at [phone].  Once you remove the patch please mail it back to the company as soon as possible so we can get the results.  Your physician recommends that you schedule a follow-up appointment in: 3 weeks.  If you have any questions or concerns before your next appointment please send us  a message through [address] or call our office at [phone].    TO LEAVE A MESSAGE FOR THE NURSE SELECT OPTION 2, PLEASE LEAVE A MESSAGE INCLUDING: YOUR NAME DATE OF BIRTH CALL BACK NUMBER REASON FOR CALL**this is important as we prioritize the call backs  YOU WILL RECEIVE A CALL BACK THE SAME DAY AS LONG AS YOU CALL BEFORE 4:00 PM  At the [facility], you and your health needs are our priority. As part of our continuing mission to provide you with exceptional heart care, we have created designated Provider Care Teams. These Care Teams include your primary Cardiologist (physician) and Advanced Practice Providers (APPs- Physician Assistants and Nurse Practitioners) who all work together to provide you with the care you need, when you need it.   You may see any of the following providers on your designated Care Team at your next follow up: Dr Toribio Fuel Dr [name] Dr. Morene Brownie Greig Mosses, NP Caffie Shed, [place] [facility] [place], [place] Beckey Coe, NP Jordan Lee, NP Ellouise Class, NP Tinnie Redman, PharmD Jaun Bash, PharmD   Please be sure to bring in all your medications bottles to every appointment.     Thank you for choosing [facility]

## 2024-11-26 NOTE — Progress Notes (Signed)
---- °   ReDS Vest / Clip - 11/26/24 1100       ReDS Vest / Clip   Station Marker C    Ruler Value 25    ReDS Value Range Low volume    ReDS Actual Value 28

## 2024-11-26 NOTE — Telephone Encounter (Signed)
 [facility] Patient Advocate Encounter  Test billing for this patient's current coverage ([employer] MPD) returns a $0 copay for 90 day supply of Eliquis.  This test claim was processed through [facility]- copay amounts may vary at other pharmacies due to pharmacy/plan contracts, or as the patient moves through the different stages of their insurance plan.  Rachel DEL, CPhT Rx Patient Advocate Phone: [phone]

## 2024-11-26 NOTE — ED Triage Notes (Signed)
 Patient sent by heart and vascular doctor because his sodium levels are low. Denies any confusion at this time.

## 2024-11-30 ENCOUNTER — Emergency Department (HOSPITAL_COMMUNITY)

## 2024-11-30 ENCOUNTER — Observation Stay (HOSPITAL_COMMUNITY)
Admission: EM | Admit: 2024-11-30 | Discharge: 2024-12-01 | Disposition: A | Discharge status: Home / Self Care | Attending: Emergency Medicine | Admitting: Emergency Medicine

## 2024-11-30 ENCOUNTER — Other Ambulatory Visit: Payer: Self-pay

## 2024-11-30 DIAGNOSIS — I509 Heart failure, unspecified: Secondary | ICD-10-CM | POA: Diagnosis not present

## 2024-11-30 DIAGNOSIS — N179 Acute kidney failure, unspecified: Secondary | ICD-10-CM | POA: Diagnosis not present

## 2024-11-30 DIAGNOSIS — E871 Hypo-osmolality and hyponatremia: Secondary | ICD-10-CM | POA: Diagnosis not present

## 2024-11-30 DIAGNOSIS — I5022 Chronic systolic (congestive) heart failure: Secondary | ICD-10-CM | POA: Diagnosis not present

## 2024-11-30 DIAGNOSIS — I959 Hypotension, unspecified: Secondary | ICD-10-CM | POA: Diagnosis present

## 2024-11-30 DIAGNOSIS — I428 Other cardiomyopathies: Secondary | ICD-10-CM | POA: Diagnosis not present

## 2024-11-30 DIAGNOSIS — I4891 Unspecified atrial fibrillation: Secondary | ICD-10-CM

## 2024-11-30 DIAGNOSIS — Z79899 Other long term (current) drug therapy: Secondary | ICD-10-CM | POA: Diagnosis not present

## 2024-11-30 DIAGNOSIS — E2740 Unspecified adrenocortical insufficiency: Secondary | ICD-10-CM | POA: Diagnosis present

## 2024-11-30 DIAGNOSIS — I10 Essential (primary) hypertension: Secondary | ICD-10-CM | POA: Diagnosis present

## 2024-11-30 DIAGNOSIS — D509 Iron deficiency anemia, unspecified: Secondary | ICD-10-CM | POA: Diagnosis present

## 2024-11-30 DIAGNOSIS — R55 Syncope and collapse: Secondary | ICD-10-CM | POA: Diagnosis present

## 2024-11-30 DIAGNOSIS — I9589 Other hypotension: Secondary | ICD-10-CM | POA: Insufficient documentation

## 2024-11-30 DIAGNOSIS — I110 Hypertensive heart disease with heart failure: Secondary | ICD-10-CM | POA: Diagnosis not present

## 2024-11-30 DIAGNOSIS — F1090 Alcohol use, unspecified, uncomplicated: Secondary | ICD-10-CM | POA: Diagnosis present

## 2024-11-30 LAB — CBC
HCT: 38.7 % — ABNORMAL LOW (ref 39.0–52.0)
Hemoglobin: 13 g/dL (ref 13.0–17.0)
MCH: 33 pg (ref 26.0–34.0)
MCHC: 33.6 g/dL (ref 30.0–36.0)
MCV: 98.2 fL (ref 80.0–100.0)
Platelets: 167 K/uL (ref 150–400)
RBC: 3.94 MIL/uL — ABNORMAL LOW (ref 4.22–5.81)
RDW: 15.2 % (ref 11.5–15.5)
WBC: 6.2 K/uL (ref 4.0–10.5)
nRBC: 0 % (ref 0.0–0.2)

## 2024-11-30 LAB — TROPONIN T, HIGH SENSITIVITY
Troponin T High Sensitivity: 55 ng/L — ABNORMAL HIGH (ref 0–19)
Troponin T High Sensitivity: 49 ng/L — ABNORMAL HIGH (ref 0–19)

## 2024-11-30 LAB — BASIC METABOLIC PANEL WITH GFR
Anion gap: 16 — ABNORMAL HIGH (ref 5–15)
BUN: 16 mg/dL (ref 8–23)
CO2: 23 mmol/L (ref 22–32)
Calcium: 8.6 mg/dL — ABNORMAL LOW (ref 8.9–10.3)
Chloride: 89 mmol/L — ABNORMAL LOW (ref 98–111)
Creatinine, Ser: 1.38 mg/dL — ABNORMAL HIGH (ref 0.61–1.24)
GFR, Estimated: 55 mL/min — ABNORMAL LOW
Glucose, Bld: 105 mg/dL — ABNORMAL HIGH (ref 70–99)
Potassium: 3.5 mmol/L (ref 3.5–5.1)
Sodium: 128 mmol/L — ABNORMAL LOW (ref 135–145)

## 2024-11-30 LAB — TSH: TSH: 0.625 u[IU]/mL (ref 0.350–4.500)

## 2024-11-30 LAB — CORTISOL: Cortisol, Plasma: 58.9 ug/dL

## 2024-11-30 LAB — PRO BRAIN NATRIURETIC PEPTIDE: Pro Brain Natriuretic Peptide: 2101 pg/mL — ABNORMAL HIGH

## 2024-11-30 LAB — HIV ANTIBODY (ROUTINE TESTING W REFLEX): HIV Screen 4th Generation wRfx: NONREACTIVE

## 2024-11-30 MED ORDER — SODIUM CHLORIDE 0.9 % IV BOLUS
500.0000 mL | Freq: Once | INTRAVENOUS | Status: AC
  Administered 2024-11-30: 500 mL via INTRAVENOUS

## 2024-11-30 MED ORDER — NOREPINEPHRINE 4 MG/250ML-% IV SOLN: 0.0000 ug/min | INTRAVENOUS | Status: DC

## 2024-11-30 MED ORDER — SODIUM CHLORIDE 0.9 % IV BOLUS
500.0000 mL | Freq: Once | INTRAVENOUS | Status: AC
  Administered 2024-11-30: 250 mL via INTRAVENOUS

## 2024-11-30 MED ORDER — THIAMINE HCL 100 MG/ML IJ SOLN
100.0000 mg | Freq: Every day | INTRAMUSCULAR | Status: DC
  Filled 2024-11-30: qty 2

## 2024-11-30 MED ORDER — SODIUM CHLORIDE 0.9% FLUSH: 3.0000 mL | Freq: Two times a day (BID) | INTRAVENOUS | Status: DC

## 2024-11-30 MED ORDER — HYDROCORTISONE 5 MG PO TABS
5.0000 mg | ORAL_TABLET | Freq: Every day | ORAL | Status: DC
  Filled 2024-11-30: qty 1
  Filled 2024-11-30: qty 0.5

## 2024-11-30 MED ORDER — LORAZEPAM 2 MG/ML IJ SOLN: 1.0000 mg | INTRAMUSCULAR | Status: DC | PRN

## 2024-11-30 MED ORDER — APIXABAN 5 MG PO TABS
5.0000 mg | ORAL_TABLET | Freq: Two times a day (BID) | ORAL | Status: DC
  Administered 2024-11-30: 5 mg via ORAL
  Filled 2024-11-30: qty 1

## 2024-11-30 MED ORDER — HYDROCORTISONE SOD SUC (PF) 100 MG IJ SOLR
100.0000 mg | Freq: Once | INTRAMUSCULAR | Status: AC
  Administered 2024-11-30: 100 mg via INTRAVENOUS
  Filled 2024-11-30: qty 2

## 2024-11-30 MED ORDER — LORAZEPAM 1 MG PO TABS: 1.0000 mg | ORAL_TABLET | ORAL | Status: DC | PRN

## 2024-11-30 MED ORDER — FOLIC ACID 1 MG PO TABS
1.0000 mg | ORAL_TABLET | Freq: Every day | ORAL | Status: DC
  Administered 2024-11-30: 1 mg via ORAL
  Filled 2024-11-30: qty 1

## 2024-11-30 MED ORDER — DIGOXIN 125 MCG PO TABS
0.1250 mg | ORAL_TABLET | Freq: Every day | ORAL | Status: DC
  Administered 2024-11-30: 0.125 mg via ORAL
  Filled 2024-11-30: qty 1

## 2024-11-30 MED ORDER — ADULT MULTIVITAMIN W/MINERALS CH
1.0000 | ORAL_TABLET | Freq: Every day | ORAL | Status: DC
  Administered 2024-11-30: 1 via ORAL
  Filled 2024-11-30: qty 1

## 2024-11-30 MED ORDER — THIAMINE MONONITRATE 100 MG PO TABS
100.0000 mg | ORAL_TABLET | Freq: Every day | ORAL | Status: DC
  Administered 2024-11-30: 100 mg via ORAL
  Filled 2024-11-30: qty 1

## 2024-11-30 MED ORDER — EMPAGLIFLOZIN 10 MG PO TABS: 10.0000 mg | ORAL_TABLET | Freq: Every day | ORAL | Status: DC

## 2024-11-30 MED ORDER — SODIUM CHLORIDE 0.9 % IV SOLN: INTRAVENOUS | Status: DC

## 2024-11-30 MED ORDER — SODIUM CHLORIDE 0.9 % IV BOLUS: 500.0000 mL | Freq: Once | INTRAVENOUS | Status: DC

## 2024-11-30 MED ORDER — FERROUS SULFATE 325 (65 FE) MG PO TABS: 325.0000 mg | ORAL_TABLET | Freq: Every day | ORAL | Status: DC

## 2024-11-30 NOTE — ED Notes (Signed)
EDP aware of patient BP 

## 2024-11-30 NOTE — ED Provider Notes (Signed)
 " [place] [facility]ERGENCY DEPARTMENT AT [facility] Provider Note   CSN: [id number] Arrival date & time: 11/30/24  1200     Patient presents with: Loss of Consciousness   ZIV WELCHEL is a 68 y.o. male.    Loss of Consciousness  Patient is a 68 year old male with a past medical history significant for hypertension, reflux, CHF, hyponatremia, hypomagnesemia, AKI's, alcohol  abuse, nonischemic cardiomyopathy  Patient presents emergency room today with complaints of syncope.  He tells me that he was at [facility] and felt briefly lightheaded before passing out.  He did have some urinary incontinence however no tongue biting, no history of seizure, no postictal period.  He denies any pain in his chest pain, headache, abdominal pain.  No nausea or vomiting no other associate symptoms.  He has a history of A-fib on Eliquis .  EMS found SBP of 60 and gave 1L NS w improvement in BP to 100. BP did drop slightly lower to 70/40 and an additional bolus of fluid was given with improvement in BP. He was asymptomatic during this period.       Prior to Admission medications  Medication Sig Start Date End Date Taking? Authorizing Provider  apixaban  (ELIQUIS ) 5 MG TABS tablet Take 1 tablet (5 mg total) by mouth 2 (two) times daily. 11/26/24   [place], Harlene HERO, FNP  digoxin  (LANOXIN ) 0.125 MG tablet Take 1 tablet (0.125 mg total) by mouth daily. 03/26/24   Colletta Manuelita Garre, PA-C  docusate sodium  (COLACE) 100 MG capsule Take 1 capsule (100 mg total) by mouth 2 (two) times daily. Patient not taking: Reported on 11/26/2024 01/15/24   Regalado, Belkys A, MD  ferrous sulfate  325 (65 FE) MG tablet Take 1 tablet (325 mg total) by mouth daily with breakfast. 01/16/24   Regalado, Belkys A, MD  furosemide  (LASIX ) 40 MG tablet Take 1.5 tablets (60 mg total) by mouth daily. 03/11/24   Lee, Jordan, NP  hydrocortisone  (CORTEF ) 5 MG tablet Take 1 tablet (5 mg total) by mouth daily. Start one daily after  twice daily for 15 daily. 04/01/24   Petrina Pries, NP  JARDIANCE  10 MG TABS tablet TAKE 1 TABLET BY MOUTH DAILY BEFORE BREAKFAST. 10/08/24   Rolan Ezra RAMAN, MD  loperamide (IMODIUM) 2 MG capsule Take 2 mg by mouth every 6 (six) hours as needed for diarrhea or loose stools. 10/28/23   [provider]  losartan  (COZAAR ) 25 MG tablet Take 1 tablet (25 mg total) by mouth daily. 03/26/24 12/02/24  Colletta Manuelita Garre, PA-C  magnesium  oxide (MAG-OX) 400 MG tablet Take 1 tablet (400 mg total) by mouth daily for 14 days. 11/26/24 12/10/24  Cottie Donnice PARAS, MD  metoprolol  succinate (TOPROL  XL) 25 MG 24 hr tablet Take 1 tablet (25 mg total) by mouth daily. 04/16/24   Glena Harlene HERO, FNP  Multiple Vitamin (MULTIVITAMIN) tablet Take 1 tablet by mouth daily. Patient not taking: Reported on 11/26/2024    [provider]  senna (SENOKOT) 8.6 MG TABS tablet Take 1 tablet (8.6 mg total) by mouth 2 (two) times daily. Patient not taking: Reported on 11/26/2024 01/15/24   Regalado, Owen A, MD  sildenafil  (VIAGRA ) 25 MG tablet TAKE 2 TABLETS (50 MG TOTAL) BY MOUTH AS NEEDED. 10/13/24   Rolan Ezra RAMAN, MD  spironolactone  (ALDACTONE ) 25 MG tablet Take 1 tablet (25 mg total) by mouth daily. 09/14/24 12/13/24  Rolan Ezra RAMAN, MD    Allergies: Latex    Review of Systems  Cardiovascular:  Positive for syncope.    Updated Vital Signs BP 90/63   Pulse 83   Temp (!) 97.3 F (36.3 C) (Oral)   Resp 16   SpO2 100%   Physical Exam Vitals and nursing note reviewed.  Constitutional:      General: He is not in acute distress. HENT:     Head: Normocephalic and atraumatic.     Nose: Nose normal.     Mouth/Throat:     Mouth: Mucous membranes are dry.  Eyes:     General: No scleral icterus. Cardiovascular:     Rate and Rhythm: Normal rate and regular rhythm.     Pulses: Normal pulses.     Heart sounds: Normal heart sounds.  Pulmonary:     Effort: Pulmonary effort is normal. No respiratory  distress.     Breath sounds: No wheezing.     Comments: Zio patch on left anterior chest Abdominal:     Palpations: Abdomen is soft.     Tenderness: There is no abdominal tenderness.  Musculoskeletal:     Cervical back: Normal range of motion.     Right lower leg: No edema.     Left lower leg: No edema.  Skin:    General: Skin is warm and dry.     Capillary Refill: Capillary refill takes less than 2 seconds.  Neurological:     Mental Status: He is alert. Mental status is at baseline.  Psychiatric:        Mood and Affect: Mood normal.        Behavior: Behavior normal.     (all labs ordered are listed, but only abnormal results are displayed) Labs Reviewed  CBC - Abnormal; Notable for the following components:      Result Value   RBC 3.94 (*)    HCT 38.7 (*)    All other components within normal limits  PRO BRAIN NATRIURETIC PEPTIDE - Abnormal; Notable for the following components:   Pro Brain Natriuretic Peptide 2,101.0 (*)    All other components within normal limits  BASIC METABOLIC PANEL WITH GFR  TROPONIN T, HIGH SENSITIVITY  TROPONIN T, HIGH SENSITIVITY    EKG: EKG Interpretation Date/Time:  Tuesday November 30 2024 12:19:40 EST Ventricular Rate:  97 PR Interval:    QRS Duration:  155 QT Interval:  412 QTC Calculation: 524 R Axis:   -74  Text Interpretation: Atrial fibrillation Left bundle branch block Confirmed by Garrick Charleston [phone]) on 11/30/2024 1:14:30 PM  Radiology: DG Chest 2 View Result Date: 11/30/2024 CLINICAL DATA:  Syncope. EXAM: CHEST - 2 VIEW COMPARISON:  March 11, 2024 FINDINGS: The cardiac silhouette is mildly enlarged and unchanged in size. A lung volumes are noted. Mild, stable bibasilar scarring, atelectasis and/or infiltrate is seen. No pleural effusion or pneumothorax is identified. The visualized skeletal structures are unremarkable. IMPRESSION: Low lung volumes with mild, stable bibasilar scarring, atelectasis and/or infiltrate.  Electronically Signed   By: Suzen Dials M.D.   On: 11/30/2024 14:33   CT Head Wo Contrast Result Date: 11/30/2024 EXAM: CT HEAD WITHOUT CONTRAST 11/30/2024 01:32:06 PM TECHNIQUE: CT of the head was performed without the administration of intravenous contrast. Automated exposure control, iterative reconstruction, and/or weight based adjustment of the mA/kV was utilized to reduce the radiation dose to as low as reasonably achievable. COMPARISON: CT head 05/20/2023 CLINICAL HISTORY: Head trauma, minor (Age >= 65y); on DOAC FINDINGS: BRAIN AND VENTRICLES: No acute hemorrhage. No evidence of acute infarct. No hydrocephalus. No  extra-axial collection. No mass effect or midline shift. Similar patchy white matter hypodensities, compatible with chronic ischemic change. Cerebral atrophy. ORBITS: No acute abnormality. SINUSES: No acute abnormality. SOFT TISSUES AND SKULL: No acute soft tissue abnormality. No skull fracture. IMPRESSION: 1. No acute intracranial abnormality. Electronically signed by: Gilmore Molt 11/30/2024 01:44 PM EST RP Workstation: [id number]     .Critical Care  Performed by: Neldon Hamp RAMAN, PA Authorized by: Neldon Hamp RAMAN, PA   Critical care provider statement:    Critical care time (minutes):  35   Critical care time was exclusive of:  Separately billable procedures and treating other patients and teaching time   Critical care was necessary to treat or prevent imminent or life-threatening deterioration of the following conditions:  Cardiac failure   Critical care was time spent personally by me on the following activities:  Development of treatment plan with patient or surrogate, review of old charts, re-evaluation of patient's condition, pulse oximetry, ordering and review of radiographic studies, ordering and review of laboratory studies, ordering and performing treatments and interventions, obtaining history from patient or surrogate, examination of patient and evaluation  of patient's response to treatment   Care discussed with: admitting provider      Medications Ordered in the ED  sodium chloride  0.9 % bolus 500 mL (250 mLs Intravenous New Bag/Given 11/30/24 1514)                                    Medical Decision Making Amount and/or Complexity of Data Reviewed Labs: ordered. Radiology: ordered. ECG/medicine tests: ordered.   This patient presents to the ED for concern of syncope, this involves a number of treatment options, and is a complaint that carries with it a moderate risk of complications and morbidity. A differential diagnosis was considered for the patient's symptoms which is discussed below:   The differential diagnosis for syncope includes (but is not limited to) anemia, hypoglycemia, arrhythmia, seizure.  If associated with pain loss of consciousness can certainly also include subarachnoid hemorrhage, thoracic aortic dissection, PE, ectopic pregnancy, AAA.  Also with considering is a vasovagal reaction, hypovolemia/dehydration.    Co morbidities: Discussed in HPI   Brief History:  Patient is a 68 year old male with a past medical history significant for hypertension, reflux, CHF, hyponatremia, hypomagnesemia, AKI's, alcohol  abuse, nonischemic cardiomyopathy  Patient presents emergency room today with complaints of syncope.  He tells me that he was at [facility] and felt briefly lightheaded before passing out.  He did have some urinary incontinence however no tongue biting, no history of seizure, no postictal period.  He denies any pain in his chest pain, headache, abdominal pain.  No nausea or vomiting no other associate symptoms.  He has a history of A-fib on Eliquis .  EMS found SBP of 60 and gave 1L NS w improvement in BP to 100. BP did drop slightly lower to 70/40 and an additional bolus of fluid was given with improvement in BP. He was asymptomatic during this period.     EMR reviewed including pt PMHx, past surgical  history and past visits to ER.   See HPI for more details   Lab Tests:   I ordered and independently interpreted labs. Labs notable for BNP 2000, 8500   4 days ago Troponin, BMP pending.   Imaging Studies:  NAD. I personally reviewed all imaging studies and no acute abnormality found. I agree with radiology  interpretation.    Cardiac Monitoring:  The patient was maintained on a cardiac monitor.  I personally viewed and interpreted the cardiac monitored which showed an underlying rhythm of: A-fib EKG non-ischemic atrial fibrillation   Medicines ordered:  I ordered medication including 500 mL of normal saline ordered however only 100 mL of normal saline given for brief episode of hypotension Reevaluation of the patient after these medicines showed that the patient improved I have reviewed the patients home medicines and have made adjustments as needed   Critical Interventions:     Consults/Attending Physician   I discussed this case with my attending physician who cosigned this note including patient's presenting symptoms, physical exam, and planned diagnostics and interventions. Attending physician stated agreement with plan or made changes to plan which were implemented.   Reevaluation:  After the interventions noted above I re-evaluated patient and found that they have :improved   Social Determinants of Health:      Problem List / ED Course:  Patient with syncopal episode has severe heart failure episode was brief doubt seizure no postictal symptoms.  Will admit to hospitalist.   Dispostion:  After consideration of the diagnostic results and the patients response to treatment, I feel that the patent would benefit from admission.    Final diagnoses:  Syncope and collapse  Acute heart failure, unspecified heart failure type [facility])    ED Discharge Orders     None          Neldon Hamp RAMAN, [place] 11/30/24 1521    Garrick Charleston,  MD 11/30/24 1558  "

## 2024-11-30 NOTE — ED Notes (Signed)
 I CALLED TO PUT PATIENT ON MONITOR.

## 2024-11-30 NOTE — ED Provider Notes (Signed)
 " Physical Exam  BP (!) 86/62   Pulse 87   Temp 97.6 F (36.4 C) (Oral)   Resp 18   SpO2 100%   Physical Exam Vitals and nursing note reviewed.  Constitutional:      Appearance: Normal appearance.     Comments: Does not appear to be fluid overloaded.  HENT:     Head: Normocephalic and atraumatic.     Nose: Nose normal.     Mouth/Throat:     Mouth: Mucous membranes are moist.  Eyes:     General: No scleral icterus.    Extraocular Movements: Extraocular movements intact.     Conjunctiva/sclera: Conjunctivae normal.  Cardiovascular:     Rate and Rhythm: Normal rate and regular rhythm.     Pulses: Normal pulses.     Heart sounds: Normal heart sounds.  Pulmonary:     Effort: Pulmonary effort is normal. No respiratory distress.     Breath sounds: Normal breath sounds. No stridor. No wheezing, rhonchi or rales.  Abdominal:     General: Abdomen is flat. Bowel sounds are normal. There is no distension.     Palpations: Abdomen is soft.     Tenderness: There is no abdominal tenderness. There is no guarding.  Musculoskeletal:        General: No tenderness. Normal range of motion.     Cervical back: Normal range of motion.     Right lower leg: No edema.     Left lower leg: No edema.  Skin:    General: Skin is warm and dry.     Capillary Refill: Capillary refill takes less than 2 seconds.     Coloration: Skin is not jaundiced or pale.  Neurological:     Mental Status: He is alert and oriented to person, place, and time.     Procedures  Procedures  ED Course / MDM    Medical Decision Making Amount and/or Complexity of Data Reviewed Labs: ordered. Radiology: ordered. ECG/medicine tests: ordered.  Risk Prescription drug management.   Signout from [employer] PA-C at shift change. Patient with past medical history significant for hypertension, reflux, CHF, hyponatremia, hypomagnesemia, AKI's, alcohol  abuse, nonischemic cardiomyopathy, and adrenal insufficiency. Briefly,  patient presents for syncope and collapse earlier today while at [employer]. He had not eaten this morning or had his daily medications. Noted to be hypotensive with EMS and received 1 L NS en-route. Received additional 100 mL fluid here for persistent hypotension.   Currently established with cardiology. Last echocardiogram on 11/21 showing EF 20-25%.   Plan: BMP pending. Plan to admit to hospitalist for ongoing management due to persistent hypotension.    4:22 PM Reassessment performed. Patient appears comfortable laying in bed watching television. He denies any symptoms at this time. He denies dizziness, palpitations, shortness of breath, weakness, or chest pain when his BP is low. He denies recent leg or abdominal edema. He is able to lay flat in bed to sleep without difficulty. He goes on to say he only sleeps in the recliner at night when football is on.   Labs and imaging personally reviewed and interpreted including: Hyponatremia of 128.    Reviewed additional pertinent lab work and imaging with patient at bedside including: Persistent hypotension despite fluid management.   Consultations made with cardiology Raechel Lesches MD) and hospitalist Charlyne Fuss MD).  Most current vital signs reviewed and are as follows: BP (!) 86/62   Pulse 87   Temp 97.6 F (36.4 C) (Oral)  Resp 18   SpO2 100%   Patient to be admitted for ongoing management of hypotension and ongoing evaluation with cardiology and hospitalist.   This note was produced using Dragon [employer] voice recognition. While the provider has reviewed and verified all clinical information, transcription errors may remain.    Rosina Almarie LABOR, PA-C 12/01/24 1433    Patt Alm Macho, MD 12/01/24 1456  "

## 2024-11-30 NOTE — Consult Note (Addendum)
 "  Cardiology Consultation   Patient ID: Austin [name] MRN: [id number]; DOB: 1956/04/03  Admit date: 11/30/2024 Date of Consult: 11/30/2024  PCP:  Georgina Speaks, FNP   [facility] Providers Cardiologist:  None  Advanced Heart Failure:  [name], MD    Patient Profile: Austin [name] is a 68 y.o. male with a hx of hypertension, chronic systolic heart failure/NICM, atrial fibrillation, EtOH abuse, hyponatremia, adrenal insufficiency who is being seen 11/30/2024 for the evaluation of syncope at the request of Dr. Patt.  History of Present Illness: [name] is a 68 year old male with past medical history noted above who has been followed by advanced heart failure as well as EP as an outpatient.  His history of heart failure dates back to 2021 when he underwent echocardiogram in March and found to have an EF less than 20% with severe RV dysfunction.  Underwent right left heart cath which showed no CAD but elevated filling pressures with preserved cardiac index.  Underwent cardiac MRI with LVEF of 14% RVEF 16% and no late gadolinium enhancement.  EF improved to 50 to 55% 04/2022 but repeat echo in 07/2023 showed decline back to 25 to 30% with normal RV.  Suspected drop was felt to be secondary to medication noncompliance and EtOH use.  Seen by EP 12/2023 for consideration of CRT-D placement.  EP felt that there may not be benefit for this and placement was deferred at that time.  He was admitted 01/2024 with a fall resulting in a hip fracture and underwent left hip arthroplasty with course complicated by anemia and hypertension.  Workup revealed adrenal insufficiency and he was discharged home on hydrocortisone , Entresto , Toprol  and spironolactone  were all discontinued.  Seen in the clinic 03/2024 and sent to the ED for concern for low output heart failure.  Lactic acid was elevated at 4.7 and he was diuresed with milrinone  support with GDMT titrated.  He was enrolled in ZOLL HF  S-10 prior to discharge.  Echo 10/2024 showed an LVEF of 20 to 25%, RV severely reduced and mild to moderate TR.  He was last seen by EP 11/03/2024 and candidacy for defibrillator implant was to be discussed with heart failure with consideration for future implantation.   Seen in the advanced heart failure clinic 12/19 for follow-up with [facility] and his wife.  He had recently been recovering from a GI illness but reported he was not consistently taking his medications and had missed some scheduled visits with [facility].  Per paramedic was missing most days of his medications on their review.  Reds vest okay at this visit.  It was noted he would not be a good candidate for bad with his RV dysfunction and poor medication compliance.  He was instructed to continue metoprolol  XL 25 mg daily, Lasix  60 mg daily, losartan  25 mg daily, digoxin  0.125 mg daily Jardiance  10 mg daily and spironolactone  25 mg daily.  He was also found to be in new onset atrial fibrillation and started on Eliquis  5 mg twice daily with recommendations for a 2-week ZIO to assess burden.   He was seen in the ED later that day for hyponatremia and fatigue.  Sodium noted at 120.  He was given IV fluids, with improvement in his hyponatremia and ultimately discharged home as he was adamant on leaving the ER.  Presented to the ED today after being found unconscious in a chair at [facility].  Reports indicate his initial systolic pressure was 60.  Labs in the ED showed sodium of 128, potassium 3.5, creatinine 1.38, proBNP 2101, WBC 6.2, hemoglobin 13.  CT head negative.  Chest x-ray with mild atelectasis.  EKG shows atrial fibrillation with left bundle branch block, 86 bpm.  He was given 500 cc bolus with initial improvement in his blood pressure. Cardiology asked to evaluate.  In talking with patient he reports that he got up early this morning with his wife to be at [facility] at 8 AM to be able to get a TV on cell.  States that his  wife went to get in line while he was waiting back near the TV section and he told her he did not feel right.  Sat down on some crates and then states the next thing he remembers is waking up on the floor.  He did not eat breakfast or take his usual medications this morning.  States that he has been back on everything prescribed since his last office visit in the advanced heart failure clinic.   Past Medical History:  Diagnosis Date   Arthritis    hands   CHF (congestive heart failure) (HCC)    GERD (gastroesophageal reflux disease)    Hypertension    Hypomagnesemia    Hyponatremia    Prostate cancer [facility])     Past Surgical History:  Procedure Laterality Date   LYMPHADENECTOMY Bilateral 01/26/2014   Procedure: LYMPHADENECTOMY WITH INDOCYANINE GREEN  DYE;  Surgeon: Ricardo Likens, MD;  Location: [facility] ORS;  Service: Urology;  Laterality: Bilateral;   RIGHT/LEFT HEART CATH AND CORONARY ANGIOGRAPHY N/A 02/25/2020   Procedure: RIGHT/LEFT HEART CATH AND CORONARY ANGIOGRAPHY;  Surgeon: Verlin Lonni BIRCH, MD;  Location: [facility] INVASIVE CV LAB;  Service: Cardiovascular;  Laterality: N/A;   ROBOT ASSISTED LAPAROSCOPIC RADICAL PROSTATECTOMY N/A 01/26/2014   Procedure: ROBOTIC ASSISTED LAPAROSCOPIC RADICAL PROSTATECTOMY;  Surgeon: Ricardo Likens, MD;  Location: [facility] ORS;  Service: Urology;  Laterality: N/A;   SHOULDER SURGERY Left    TEE WITHOUT CARDIOVERSION N/A 04/15/2022   Procedure: TRANSESOPHAGEAL ECHOCARDIOGRAM (TEE);  Surgeon: Rolan Ezra RAMAN, MD;  Location: [facility] ENDOSCOPY;  Service: Cardiovascular;  Laterality: N/A;   TOTAL HIP ARTHROPLASTY Left 01/11/2024   Procedure: TOTAL HIP ARTHROPLASTY ANTERIOR APPROACH;  Surgeon: Fidel Rogue, MD;  Location: [facility] ORS;  Service: Orthopedics;  Laterality: Left;    Scheduled Meds:  Continuous Infusions:  sodium chloride      PRN Meds:   Allergies:   Allergies[1]  Social History:   Social History   Socioeconomic History   Marital status: Married     Spouse name: Not on file   Number of children: 2   Years of education: Not on file   Highest education level: Bachelor's degree (e.g., BA, AB, BS)  Occupational History   Not on file  Tobacco Use   Smoking status: Never   Smokeless tobacco: Never  Vaping Use   Vaping status: Never Used  Substance and Sexual Activity   Alcohol  use: Not Currently    Comment: 1-3 beers daily   Drug use: No   Sexual activity: Yes  Other Topics Concern   Not on file  Social History Narrative   Not on file   Social Drivers of Health   Tobacco Use: Low Risk (11/26/2024)   Patient History    Smoking Tobacco Use: Never    Smokeless Tobacco Use: Never    Passive Exposure: Not on file  Financial Resource Strain: Low Risk (02/11/2024)   Overall Financial Resource Strain (CARDIA)    Difficulty  of Paying Living Expenses: Not hard at all  Food Insecurity: No Food Insecurity (03/11/2024)   Hunger Vital Sign    Worried About Running Out of Food in the Last Year: Never true    Ran Out of Food in the Last Year: Never true  Transportation Needs: No Transportation Needs (03/11/2024)   PRAPARE - Administrator, Civil Service (Medical): No    Lack of Transportation (Non-Medical): No  Physical Activity: Insufficiently Active (02/11/2024)   Exercise Vital Sign    Days of Exercise per Week: 2 days    Minutes of Exercise per Session: 40 min  Stress: No Stress Concern Present (02/11/2024)   [employer] of Occupational Health - Occupational Stress Questionnaire    Feeling of Stress : Not at all  Social Connections: Socially Integrated (03/11/2024)   Social Connection and Isolation Panel    Frequency of Communication with Friends and Family: Never    Frequency of Social Gatherings with Friends and Family: More than three times a week    Attends Religious Services: More than 4 times per year    Active Member of [employer] or Organizations: Yes    Attends Engineer, Structural: More than 4 times per year     Marital Status: Married  Catering Manager Violence: Not At Risk (03/11/2024)   Humiliation, Afraid, Rape, and Kick questionnaire    Fear of Current or Ex-Partner: No    Emotionally Abused: No    Physically Abused: No    Sexually Abused: No  Depression (PHQ2-9): Low Risk (02/11/2024)   Depression (PHQ2-9)    PHQ-2 Score: 0  Alcohol  Screen: Low Risk (02/11/2024)   Alcohol  Screen    Last Alcohol  Screening Score (AUDIT): 0  Housing: Low Risk (03/11/2024)   Housing Stability Vital Sign    Unable to Pay for Housing in the Last Year: No    Number of Times Moved in the Last Year: 0    Homeless in the Last Year: No  Utilities: Not At Risk (03/11/2024)   AHC Utilities    Threatened with loss of utilities: No  Health Literacy: Adequate Health Literacy (02/11/2024)   B1300 Health Literacy    Frequency of need for help with medical instructions: Never    Family History:    Family History  Problem Relation Age of Onset   Hypertension Mother    Hypertension Father      ROS:  Please see the history of present illness.   All other ROS reviewed and negative.     Physical Exam/Data: Vitals:   11/30/24 1400 11/30/24 1515 11/30/24 1550 11/30/24 1625  BP: 90/63 (!) 80/63 (!) 86/62 90/67  Pulse: 83 83 87 80  Resp: 16 15 18 18   Temp:  97.6 F (36.4 C)  (!) 97.5 F (36.4 C)  TempSrc:  Oral  Oral  SpO2: 100% 100% 100% 100%   No intake or output data in the 24 hours ending 11/30/24 1812    11/26/2024   11:15 AM 11/03/2024    9:19 AM 10/12/2024    9:03 AM  Last 3 Weights  Weight (lbs) 138 lb 153 lb 1.6 oz 147 lb  Weight (kg) 62.596 kg 69.446 kg 66.679 kg     There is no height or weight on file to calculate BMI.  General: Thin older male, sitting up in bed no distress HEENT: normal Neck: no JVD Vascular: Distal pulses 2+ bilaterally Cardiac:  normal S1, S2; irregularly irregular; no murmur  Lungs:  clear to auscultation bilaterally, no wheezing, rhonchi or rales  Abd: soft, nontender,  no hepatomegaly  Ext: no edema Musculoskeletal:  No deformities, BUE and BLE strength normal and equal Skin: warm and dry  Neuro:  no focal abnormalities noted Psych:  Normal affect   EKG:  The EKG was personally reviewed and demonstrates: Atrial fibrillation with left bundle branch block, 86 bpm Telemetry:  Telemetry was personally reviewed and demonstrates: Rate controlled atrial fibrillation, 70-80s  Relevant CV Studies:  Echo: 10/2024  IMPRESSIONS    1. Left ventricular ejection fraction, by estimation, is 20 to 25%. The  left ventricle has severely decreased function. The left ventricle  demonstrates global hypokinesis. The left ventricular internal cavity size  was mildly dilated. There is mild left  ventricular hypertrophy of the septal segment. Left ventricular diastolic  parameters are indeterminate.   2. Right ventricular systolic function is severely reduced. The right  ventricular size is normal. There is severely elevated pulmonary artery  systolic pressure.   3. Left atrial size was severely dilated.   4. Right atrial size was severely dilated.   5. The mitral valve is normal in structure. Mild mitral valve  regurgitation. No evidence of mitral stenosis.   6. Tricuspid valve regurgitation is mild to moderate.   7. The aortic valve is tricuspid. Aortic valve regurgitation is not  visualized. No aortic stenosis is present.   8. The inferior vena cava is dilated in size with <50% respiratory  variability, suggesting right atrial pressure of 15 mmHg.   FINDINGS   Left Ventricle: Left ventricular ejection fraction, by estimation, is 20  to 25%. The left ventricle has severely decreased function. The left  ventricle demonstrates global hypokinesis. The left ventricular internal  cavity size was mildly dilated. There  is mild left ventricular hypertrophy of the septal segment. Left  ventricular diastolic parameters are indeterminate.   Right Ventricle: The right  ventricular size is normal. No increase in  right ventricular wall thickness. Right ventricular systolic function is  severely reduced. There is severely elevated pulmonary artery systolic  pressure. The tricuspid regurgitant  velocity is 3.50 m/s, and with an assumed right atrial pressure of 15  mmHg, the estimated right ventricular systolic pressure is 64.0 mmHg.   Left Atrium: Left atrial size was severely dilated.   Right Atrium: Right atrial size was severely dilated.   Pericardium: There is no evidence of pericardial effusion.   Mitral Valve: The mitral valve is normal in structure. Mild mitral valve  regurgitation. No evidence of mitral valve stenosis.   Tricuspid Valve: The tricuspid valve is normal in structure. Tricuspid  valve regurgitation is mild to moderate. No evidence of tricuspid  stenosis.   Aortic Valve: The aortic valve is tricuspid. Aortic valve regurgitation is  not visualized. No aortic stenosis is present.   Pulmonic Valve: The pulmonic valve was normal in structure. Pulmonic valve  regurgitation is trivial. No evidence of pulmonic stenosis.   Aorta: The aortic root is normal in size and structure.   Venous: The inferior vena cava is dilated in size with less than 50%  respiratory variability, suggesting right atrial pressure of 15 mmHg.   IAS/Shunts: No atrial level shunt detected by color flow Doppler.   Laboratory Data: High Sensitivity Troponin:  No results for input(s): TROPONINIHS in the last 720 hours.  Recent Labs  Lab 11/30/24 1436 11/30/24 1657  TRNPT 55* 49*      Chemistry Recent Labs  Lab 11/26/24 1155 11/26/24 2019  11/30/24 1436  NA 120* 123* 128*  K 4.5 3.6 3.5  CL 82* 81* 89*  CO2 18* 25 23  GLUCOSE 78 83 105*  BUN 8 9 16   CREATININE 1.17 1.15 1.38*  CALCIUM 9.2 8.9 8.6*  MG  --  1.4*  --   GFRNONAA >60 >60 55*  ANIONGAP 20* 17* 16*    Recent Labs  Lab 11/26/24 2019  PROT 7.1  ALBUMIN 3.5  AST 106*  ALT 71*   ALKPHOS 177*  BILITOT 1.4*   Lipids No results for input(s): CHOL, TRIG, HDL, LABVLDL, LDLCALC, CHOLHDL in the last 168 hours.  Hematology Recent Labs  Lab 11/26/24 1155 11/26/24 2019 11/30/24 1243  WBC 5.8 6.2 6.2  RBC 4.54 4.39 3.94*  HGB 15.2 14.6 13.0  HCT 42.5 40.9 38.7*  MCV 93.6 93.2 98.2  MCH 33.5 33.3 33.0  MCHC 35.8 35.7 33.6  RDW 14.5 14.4 15.2  PLT 167 152 167   Thyroid  No results for input(s): TSH, FREET4 in the last 168 hours.  BNP Recent Labs  Lab 11/26/24 1155 11/26/24 2019 11/30/24 1243  PROBNP 13,098.0* 8,592.0* 2,101.0*    DDimer No results for input(s): DDIMER in the last 168 hours.  Radiology/Studies:  DG Chest 2 View Result Date: 11/30/2024 CLINICAL DATA:  Syncope. EXAM: CHEST - 2 VIEW COMPARISON:  March 11, 2024 FINDINGS: The cardiac silhouette is mildly enlarged and unchanged in size. A lung volumes are noted. Mild, stable bibasilar scarring, atelectasis and/or infiltrate is seen. No pleural effusion or pneumothorax is identified. The visualized skeletal structures are unremarkable. IMPRESSION: Low lung volumes with mild, stable bibasilar scarring, atelectasis and/or infiltrate. Electronically Signed   By: Suzen Dials M.D.   On: 11/30/2024 14:33   CT Head Wo Contrast Result Date: 11/30/2024 EXAM: CT HEAD WITHOUT CONTRAST 11/30/2024 01:32:06 PM TECHNIQUE: CT of the head was performed without the administration of intravenous contrast. Automated exposure control, iterative reconstruction, and/or weight based adjustment of the mA/kV was utilized to reduce the radiation dose to as low as reasonably achievable. COMPARISON: CT head 05/20/2023 CLINICAL HISTORY: Head trauma, minor (Age >= 65y); on DOAC FINDINGS: BRAIN AND VENTRICLES: No acute hemorrhage. No evidence of acute infarct. No hydrocephalus. No extra-axial collection. No mass effect or midline shift. Similar patchy white matter hypodensities, compatible with chronic ischemic  change. Cerebral atrophy. ORBITS: No acute abnormality. SINUSES: No acute abnormality. SOFT TISSUES AND SKULL: No acute soft tissue abnormality. No skull fracture. IMPRESSION: 1. No acute intracranial abnormality. Electronically signed by: Gilmore Molt 11/30/2024 01:44 PM EST RP Workstation: [id number]     Assessment and Plan:  Austin [name] is a 68 y.o. male with a hx of hypertension, chronic systolic heart failure/NICM, atrial fibrillation, EtOH abuse, hyponatremia, adrenal insufficiency who is being seen 11/30/2024 for the evaluation of syncope at the request of Dr. Patt.  Syncope Hypotension -- Patient presents with episode of syncope while at [facility].  States that he got up early this morning with his wife in anticipation of buying a television on sale.  He did not eat breakfast or drink anything, also skipped his morning dose of medications. -- While waiting in line he began to feel  not right, sat down on some crates and then remembers waking up on the floor.  Denies any palpitations prior to. -- EKG on admission shows rate controlled atrial fibrillation, review of telemetry without concerning arrhythmia at this time. -- proBNP 2101 (actually lower than where this has been most recently up in  the 8000 range) -- Suspect he is dehydrated as his creatinine is elevated at 1.38 as well.  He is warm and dry on exam. -- He is currently wearing a Zio patch, attempted to obtain information from ZIO app but information did not upload -- monitor on telemetry -- Hold GDMT this evening, he has received IV fluids in the ED with improvement in his blood pressure.  Ideally resume therapy as tolerated.  Atrial fibrillation -- New diagnosis in the office on 12/16 and was started on Eliquis  5 mg twice daily -- Currently rate controlled on admission, as above holding home meds for now with hypotension. -- Continue Eliquis  5 mg twice daily  Chronic systolic heart failure NICM -- Known history with  EF of 20 to 25% with brief recovery back in 2023 but recurrent drop.  Possibly due to EtOH use -- As above proBNP 2101 but actually lower than previous finding on 12/19 in the 8000 range. -- Appears dry on exam -- GDMT: Holding home metoprolol  XL, Lasix , losartan , Jardiance , Spiro.  Dig level 1.4, therefore will hold this evening as well -- Of note, scheduled for upcoming CRT-D in January  Hyponatremia, chronic -- Suspect secondary to EtOH use  Adrenal insufficiency --Known to have low a.m. cortisol during 01/2024 admission and has been on hydrocortisone  -- Office notes indicate he has been referred to outpatient endocrinology  Risk Assessment/Risk Scores:   CHA2DS2-VASc Score = 3  This indicates a 3.2% annual risk of stroke. The patient's score is based upon: CHF History: 1 HTN History: 1 Diabetes History: 0 Stroke History: 0 Vascular Disease History: 0 Age Score: 1 Gender Score: 0   For questions or updates, please contact [facility] Please consult [email or web address] for contact info under   Signed, Manuelita Rummer, NP  11/30/2024 6:12 PM   Agree with note by Manuelita Rummer NP-C  We are asked to see Mr. Waddell with a nonischemic cardiomyopathy because of hypotension.  Apparently he fell asleep while sitting in a chair in [employer] watching his wife shop.  He failed to have breakfast yesterday morning.  He appears dry to me.  His blood pressures in the 80/60 range.  Suggest IV fluids, hold meds and watch blood pressure.  His renal function has somewhat improved.  Okay for discharge home with close outpatient follow-up.  Dorn DOROTHA Lesches, M.D., FACP, [facility], LYNITA [facility] [address] [place], [place]  [address]  [phone] 12/01/2024 8:48 AM     [1]  Allergies Allergen Reactions   Latex Swelling and Rash   "

## 2024-11-30 NOTE — ED Notes (Signed)
 Lab called to request add on for cortisol level

## 2024-11-30 NOTE — H&P (Signed)
 " History and Physical    Patient: Austin [name] FMW:[id number] DOB: October 11, 1956 DOA: 11/30/2024 DOS: the patient was seen and examined on 11/30/2024 PCP: Georgina Speaks, FNP  Patient coming from: Home  Chief Complaint:  Chief Complaint  Patient presents with   Loss of Consciousness   HPI: [name] is a 68 y.o. male with medical history significant of nonischemic cardiomyopathy with a EF of 20 to 25% from November of this year, alcohol  abuse history, GERD, essential hypertension, chronic hyponatremia, osteoarthritis, adrenal insufficiency, who presented to the ER with complaint of syncopal episode today.  Patient was found unconscious in the chair in [place].  Not sure how long he was out for.  He did not hit the floor.  EMS arrived and blood pressure was 60 systolic.  Patient was noted to have bedbugs and foul-smelling urine.  Not sure if he has been taking his medications.  Patient is supposed to be on daily steroids for adrenal insufficiency.  He also has only EF of 20 to 25%.  Brought to the ER and evaluated.  Workup so far shows hyponatremia, AKI and persistent hypotension.  Patient reported not eating or drinking for couple of days.  Cardiology was consulted in the ER who recommends admission for workup for syncope.  Patient apparently still drinks alcohol .  He lives with his wife with whom he went to the [employer] today to buy television.  And did not remember what happened.  He knew he was not feeling well.  And he claimed to be on all his medications without missing any dose.  Will admit the patient for syncopal workup.  Review of Systems: As mentioned in the history of present illness. All other systems reviewed and are negative. Past Medical History:  Diagnosis Date   Arthritis    hands   CHF (congestive heart failure) (HCC)    GERD (gastroesophageal reflux disease)    Hypertension    Hypomagnesemia    Hyponatremia    Prostate cancer [facility])    Past Surgical History:   Procedure Laterality Date   LYMPHADENECTOMY Bilateral 01/26/2014   Procedure: LYMPHADENECTOMY WITH INDOCYANINE GREEN  DYE;  Surgeon: Ricardo Likens, MD;  Location: [facility];  Service: Urology;  Laterality: Bilateral;   RIGHT/LEFT HEART CATH AND CORONARY ANGIOGRAPHY N/A 02/25/2020   Procedure: RIGHT/LEFT HEART CATH AND CORONARY ANGIOGRAPHY;  Surgeon: Verlin Lonni BIRCH, MD;  Location: [facility]VASIVE [facility];  Service: Cardiovascular;  Laterality: N/A;   ROBOT ASSISTED LAPAROSCOPIC RADICAL PROSTATECTOMY N/A 01/26/2014   Procedure: ROBOTIC ASSISTED LAPAROSCOPIC RADICAL PROSTATECTOMY;  Surgeon: Ricardo Likens, MD;  Location: [facility];  Service: Urology;  Laterality: N/A;   SHOULDER SURGERY Left    TEE WITHOUT CARDIOVERSION N/A 04/15/2022   Procedure: TRANSESOPHAGEAL ECHOCARDIOGRAM (TEE);  Surgeon: Rolan Ezra RAMAN, MD;  Location: [facility] ENDOSCOPY;  Service: Cardiovascular;  Laterality: N/A;   TOTAL HIP ARTHROPLASTY Left 01/11/2024   Procedure: TOTAL HIP ARTHROPLASTY ANTERIOR APPROACH;  Surgeon: Fidel Rogue, MD;  Location: [facility];  Service: Orthopedics;  Laterality: Left;   Social History:  reports that he has never smoked. He has never used smokeless tobacco. He reports that he does not currently use alcohol . He reports that he does not use drugs.  Allergies[1]  Family History  Problem Relation Age of Onset   Hypertension Mother    Hypertension Father     Prior to Admission medications  Medication Sig Start Date End Date Taking? Authorizing Provider  apixaban  (ELIQUIS ) 5 MG TABS tablet Take 1 tablet (5  mg total) by mouth 2 (two) times daily. 11/26/24   [place], Harlene HERO, FNP  digoxin  (LANOXIN ) 0.125 MG tablet Take 1 tablet (0.125 mg total) by mouth daily. 03/26/24   Colletta Manuelita Garre, PA-C  docusate sodium  (COLACE) 100 MG capsule Take 1 capsule (100 mg total) by mouth 2 (two) times daily. Patient not taking: Reported on 11/26/2024 01/15/24   Madelyne Serum A, MD  ferrous sulfate  325 (65 FE) MG  tablet Take 1 tablet (325 mg total) by mouth daily with breakfast. 01/16/24   Regalado, Belkys A, MD  furosemide  (LASIX ) 40 MG tablet Take 1.5 tablets (60 mg total) by mouth daily. 03/11/24   Lee, Jordan, NP  hydrocortisone  (CORTEF ) 5 MG tablet Take 1 tablet (5 mg total) by mouth daily. Start one daily after twice daily for 15 daily. 04/01/24   Petrina Pries, NP  JARDIANCE  10 MG TABS tablet TAKE 1 TABLET BY MOUTH DAILY BEFORE BREAKFAST. 10/08/24   Rolan Ezra RAMAN, MD  loperamide (IMODIUM) 2 MG capsule Take 2 mg by mouth every 6 (six) hours as needed for diarrhea or loose stools. 10/28/23   [provider]  losartan  (COZAAR ) 25 MG tablet Take 1 tablet (25 mg total) by mouth daily. 03/26/24 12/02/24  Colletta Manuelita Garre, PA-C  magnesium  oxide (MAG-OX) 400 MG tablet Take 1 tablet (400 mg total) by mouth daily for 14 days. 11/26/24 12/10/24  Cottie Donnice PARAS, MD  metoprolol  succinate (TOPROL  XL) 25 MG 24 hr tablet Take 1 tablet (25 mg total) by mouth daily. 04/16/24   Glena Harlene HERO, FNP  Multiple Vitamin (MULTIVITAMIN) tablet Take 1 tablet by mouth daily. Patient not taking: Reported on 11/26/2024    [provider]  senna (SENOKOT) 8.6 MG TABS tablet Take 1 tablet (8.6 mg total) by mouth 2 (two) times daily. Patient not taking: Reported on 11/26/2024 01/15/24   Regalado, Belkys A, MD  sildenafil  (VIAGRA ) 25 MG tablet TAKE 2 TABLETS (50 MG TOTAL) BY MOUTH AS NEEDED. 10/13/24   Rolan Ezra RAMAN, MD  spironolactone  (ALDACTONE ) 25 MG tablet Take 1 tablet (25 mg total) by mouth daily. 09/14/24 12/13/24  Rolan Ezra RAMAN, MD    Physical Exam: Vitals:   11/30/24 1515 11/30/24 1550 11/30/24 1625 11/30/24 1826  BP: (!) 80/63 (!) 86/62 90/67 (!) 97/58  Pulse: 83 87 80 90  Resp: 15 18 18 16   Temp: 97.6 F (36.4 C)  (!) 97.5 F (36.4 C)   TempSrc: Oral  Oral   SpO2: 100% 100% 100% 98%   Constitutional: Acutely ill looking, NAD, calm, comfortable Eyes: PERRL, lids and conjunctivae  normal ENMT: Mucous membranes are dry. Posterior pharynx clear of any exudate or lesions.Normal dentition.  Neck: normal, supple, no masses, no thyromegaly Respiratory: clear to auscultation bilaterally, no wheezing, no crackles. Normal respiratory effort. No accessory muscle use.  Cardiovascular: Regular rate and rhythm, no murmurs / rubs / gallops. No extremity edema. 2+ pedal pulses. No carotid bruits.  Abdomen: no tenderness, no masses palpated. No hepatosplenomegaly. Bowel sounds positive.  Musculoskeletal: Good range of motion, no joint swelling or tenderness, Skin: no rashes, lesions, ulcers. No induration Neurologic: CN 2-12 grossly intact. Sensation intact, DTR normal. Strength 5/5 in all 4.  Psychiatric: Normal judgment and insight. Alert and oriented x 3. Normal mood  Data Reviewed:  Temperature is 97.3, blood pressure 80/63, pulse 97, his white count is 6.2 hemoglobin 13.0.  Sodium 128 chloride 89 creatinine 1.38 and calcium 8.6 glucose 105.  Troponin is 55 and  then 49.  proBNP is 2101 chest x-ray showed low lung volumes with mild stable basilar scaring.  Head CT without contrast showed no acute findings  Assessment and Plan:  #1 syncope: Most likely secondary to hypotension.  Patient's initial blood pressure was 60 systolic.  Will admit the patient for observation.  Place him on telemetry to rule out arrhythmias and other cardiac causes.  Cardiology on board and we will follow their recommendations.  Gentle fluids as patient seems to be slightly dehydrated but will avoid fluid overload.  I will hold his cardiac medications including diuretics beta-blockers and ARB.  Will resume that when he is stable.  #2 persistent hypotension: Patient has known history of adrenal insufficiency.  He is taking hydrocortisone  5 mg daily.  Not sure if patient has been compliant.  Will check random cortisol and probably a.m. cortisol.  Give stress dose steroids and resume home cortisol.  Continue to  monitor blood pressure.  #3 nonischemic cardiomyopathy: Extensive workup at the heart failure clinic.  Patient will resume.  Cardiology already consulted and will follow their recommendations.  #3 history of alcohol  abuse: Patient denying usage today.  We will empirically start on CIWA protocol if needed.  #5 hyponatremia: Chronic.  Patient however has hypochloremia again.  This could reflect either alcohol  induced or dehydration.  Gentle saline hydration monitor  #6 AKI: Creatinine 1.38.  Baseline is around 1.1.  Gentle hydration and monitor.  #7 troponin elevation: Secondary to CHF.       Advance Care Planning:   Code Status: Full Code   Consults: Cardiology, Dr. Henry  Family Communication: Wife  Severity of Illness: The appropriate patient status for this patient is OBSERVATION. Observation status is judged to be reasonable and necessary in order to provide the required intensity of service to ensure the patient's safety. The patient's presenting symptoms, physical exam findings, and initial radiographic and laboratory data in the context of their medical condition is felt to place them at decreased risk for further clinical deterioration. Furthermore, it is anticipated that the patient will be medically stable for discharge from the hospital within 2 midnights of admission.   AuthorBETHA SIM KNOLL, MD 11/30/2024 7:01 PM  For on call review [email or web address].     [1]  Allergies Allergen Reactions   Latex Swelling and Rash   "

## 2024-11-30 NOTE — TOC CM/SW Note (Signed)
 TOC consult received for substance abuse. Follow-up to be completed with patient as appropriate.    Merilee Batty, MSN, RN Case Management [phone]

## 2024-11-30 NOTE — ED Notes (Signed)
 Patient's BP readings have been consistently soft with MAP ranging from 61-65. MD aware, no current orders at this time due to patient medical hx.

## 2024-11-30 NOTE — ED Triage Notes (Signed)
 [facility] arriving w patient from [place].   The patient was found unconscious in a chair in [employer], unknown down time, did not hit floor- Patient was alert  by the time EMS arrived with initial pressure of 60 systolic.  Patient has bedbugs. Foul smelling urine per EMS.     EMS vitals and interventions: 1L NS 20G R Hand  BP 92 systolic HR 80 Afib CBG 195 RR 18 SpO2 100

## 2024-12-01 DIAGNOSIS — R55 Syncope and collapse: Secondary | ICD-10-CM | POA: Diagnosis not present

## 2024-12-01 LAB — CBC
HCT: 39.8 % (ref 39.0–52.0)
Hemoglobin: 13.7 g/dL (ref 13.0–17.0)
MCH: 32.9 pg (ref 26.0–34.0)
MCHC: 34.4 g/dL (ref 30.0–36.0)
MCV: 95.4 fL (ref 80.0–100.0)
Platelets: 190 K/uL (ref 150–400)
RBC: 4.17 MIL/uL — ABNORMAL LOW (ref 4.22–5.81)
RDW: 14.9 % (ref 11.5–15.5)
WBC: 3.1 K/uL — ABNORMAL LOW (ref 4.0–10.5)
nRBC: 0 % (ref 0.0–0.2)

## 2024-12-01 LAB — COMPREHENSIVE METABOLIC PANEL WITH GFR
ALT: 41 U/L (ref 0–44)
AST: 43 U/L — ABNORMAL HIGH (ref 15–41)
Albumin: 3.5 g/dL (ref 3.5–5.0)
Alkaline Phosphatase: 128 U/L — ABNORMAL HIGH (ref 38–126)
Anion gap: 13 (ref 5–15)
BUN: 17 mg/dL (ref 8–23)
CO2: 24 mmol/L (ref 22–32)
Calcium: 8.3 mg/dL — ABNORMAL LOW (ref 8.9–10.3)
Chloride: 94 mmol/L — ABNORMAL LOW (ref 98–111)
Creatinine, Ser: 1.12 mg/dL (ref 0.61–1.24)
GFR, Estimated: >60 mL/min
Glucose, Bld: 173 mg/dL — ABNORMAL HIGH (ref 70–99)
Potassium: 3.4 mmol/L — ABNORMAL LOW (ref 3.5–5.1)
Sodium: 131 mmol/L — ABNORMAL LOW (ref 135–145)
Total Bilirubin: 1 mg/dL (ref 0.0–1.2)
Total Protein: 7.5 g/dL (ref 6.5–8.1)

## 2024-12-01 LAB — CORTISOL: Cortisol, Plasma: 94.2 ug/dL

## 2024-12-01 LAB — CBG MONITORING, ED: Glucose-Capillary: 168 mg/dL — ABNORMAL HIGH (ref 70–99)

## 2024-12-01 NOTE — Discharge Summary (Signed)
 Physician Discharge Summary  Austin [name] FMW:[id number] DOB: 1956-01-27 DOA: 11/30/2024  PCP: Georgina Speaks, FNP  Admit date: 11/30/2024 Discharge date: 12/01/2024  Patient left facility AGAINST MEDICAL ADVICE early this morning prior to being evaluated  Brief/Interim Summary:  Patient was admitted after episode of syncope, found unconscious at a local [employer], unclear how long patient had been down but did not fall.  Upon initial evaluation with EMS he had notably low systolic blood pressure at 60 -recent echo confirms EF of 20 to 25%.  He reports poor p.o. intake to prior team.  Cardiology also consulted at intake but unable to evaluate the patient as he left AGAINST MEDICAL ADVICE prior to being evaluated this morning.  Concern patient had syncope secondary to orthostatic or hypovolemic status, there is also concern of medication noncompliance.  Unfortunately he was unable to be examined or interviewed given above.  Discharge Diagnoses:  Principal Problem:   Syncope and collapse Active Problems:   Essential hypertension   AKI (acute kidney injury)   Alcohol  use disorder   Adrenal insufficiency   NICM (nonischemic cardiomyopathy) (HCC)   Iron deficiency anemia   Hyponatremia   Hypotension    Procedures/Studies: DG Chest 2 View Result Date: 11/30/2024 CLINICAL DATA:  Syncope. EXAM: CHEST - 2 VIEW COMPARISON:  March 11, 2024 FINDINGS: The cardiac silhouette is mildly enlarged and unchanged in size. A lung volumes are noted. Mild, stable bibasilar scarring, atelectasis and/or infiltrate is seen. No pleural effusion or pneumothorax is identified. The visualized skeletal structures are unremarkable. IMPRESSION: Low lung volumes with mild, stable bibasilar scarring, atelectasis and/or infiltrate. Electronically Signed   By: Suzen Dials M.D.   On: 11/30/2024 14:33   CT Head Wo Contrast Result Date: 11/30/2024 EXAM: CT HEAD WITHOUT CONTRAST 11/30/2024 01:32:06 PM TECHNIQUE:  CT of the head was performed without the administration of intravenous contrast. Automated exposure control, iterative reconstruction, and/or weight based adjustment of the mA/kV was utilized to reduce the radiation dose to as low as reasonably achievable. COMPARISON: CT head 05/20/2023 CLINICAL HISTORY: Head trauma, minor (Age >= 65y); on DOAC FINDINGS: BRAIN AND VENTRICLES: No acute hemorrhage. No evidence of acute infarct. No hydrocephalus. No extra-axial collection. No mass effect or midline shift. Similar patchy white matter hypodensities, compatible with chronic ischemic change. Cerebral atrophy. ORBITS: No acute abnormality. SINUSES: No acute abnormality. SOFT TISSUES AND SKULL: No acute soft tissue abnormality. No skull fracture. IMPRESSION: 1. No acute intracranial abnormality. Electronically signed by: Gilmore Molt 11/30/2024 01:44 PM EST RP Workstation: [id number]     Subjective: Patient unavailable for eval   Discharge Exam: Vitals:   12/01/24 0600 12/01/24 0620  BP: (!) 95/46 107/71  Pulse: 64 82  Resp: 15 18  Temp: 98.4 F (36.9 C)   SpO2: 100% 100%   Vitals:   12/01/24 0230 12/01/24 0415 12/01/24 0600 12/01/24 0620  BP: 93/75 91/67 (!) 95/46 107/71  Pulse: 90 91 64 82  Resp: 17 19 15 18   Temp: 98.4 F (36.9 C)  98.4 F (36.9 C)   TempSrc: Oral  Oral   SpO2: 97% 100% 100% 100%    Physical exam unable to be performed due to patient's noncompliance (left facility prior to evaluation)   The results of significant diagnostics from this hospitalization (including imaging, microbiology, ancillary and laboratory) are listed below for reference.     Microbiology: No results found for this or any previous visit (from the past 240 hours).   Labs: BNP (last 3 results) Recent Labs  03/26/24 1153 04/16/24 1025 09/14/24 1212  BNP 779.1* 299.7* 344.9*   Basic Metabolic Panel: Recent Labs  Lab 11/26/24 1155 11/26/24 2019 11/30/24 1436 12/01/24 0334  NA 120*  123* 128* 131*  K 4.5 3.6 3.5 3.4*  CL 82* 81* 89* 94*  CO2 18* 25 23 24   GLUCOSE 78 83 105* 173*  BUN 8 9 16 17   CREATININE 1.17 1.15 1.38* 1.12  CALCIUM 9.2 8.9 8.6* 8.3*  MG  --  1.4*  --   --    Liver Function Tests: Recent Labs  Lab 11/26/24 2019 12/01/24 0334  AST 106* 43*  ALT 71* 41  ALKPHOS 177* 128*  BILITOT 1.4* 1.0  PROT 7.1 7.5  ALBUMIN 3.5 3.5   No results for input(s): LIPASE, AMYLASE in the last 168 hours. No results for input(s): AMMONIA in the last 168 hours. CBC: Recent Labs  Lab 11/26/24 1155 11/26/24 2019 11/30/24 1243 12/01/24 0334  WBC 5.8 6.2 6.2 3.1*  HGB 15.2 14.6 13.0 13.7  HCT 42.5 40.9 38.7* 39.8  MCV 93.6 93.2 98.2 95.4  PLT 167 152 167 190   Cardiac Enzymes: No results for input(s): CKTOTAL, CKMB, CKMBINDEX, TROPONINI in the last 168 hours. BNP: Invalid input(s): POCBNP CBG: Recent Labs  Lab 12/01/24 0459  GLUCAP 168*   D-Dimer No results for input(s): DDIMER in the last 72 hours. Hgb A1c No results for input(s): HGBA1C in the last 72 hours. Lipid Profile No results for input(s): CHOL, HDL, LDLCALC, TRIG, CHOLHDL, LDLDIRECT in the last 72 hours. Thyroid  function studies Recent Labs    11/30/24 2003  TSH 0.625   Anemia work up No results for input(s): VITAMINB12, FOLATE, FERRITIN, TIBC, IRON, RETICCTPCT in the last 72 hours. Urinalysis    Component Value Date/Time   COLORURINE YELLOW 05/17/2023 1227   APPEARANCEUR CLEAR 05/17/2023 1227   LABSPEC 1.006 05/17/2023 1227   PHURINE 6.0 05/17/2023 1227   GLUCOSEU NEGATIVE 05/17/2023 1227   HGBUR NEGATIVE 05/17/2023 1227   BILIRUBINUR NEGATIVE 05/17/2023 1227   BILIRUBINUR negative 08/14/2022 1408   KETONESUR TRACE (A) 05/17/2023 1227   PROTEINUR 30 (A) 05/17/2023 1227   UROBILINOGEN 1.0 08/14/2022 1408   UROBILINOGEN 1.0 08/27/2007 2227   NITRITE NEGATIVE 05/17/2023 1227   LEUKOCYTESUR NEGATIVE 05/17/2023 1227   Sepsis  Labs Recent Labs  Lab 11/26/24 1155 11/26/24 2019 11/30/24 1243 12/01/24 0334  WBC 5.8 6.2 6.2 3.1*   Microbiology No results found for this or any previous visit (from the past 240 hours).   Time coordinating discharge: Over 30 minutes  SIGNED:   Elsie JAYSON Montclair, DO [facility] 12/01/2024, 7:10 AM Pager   If 7PM-7AM, please contact night-coverage [email or web address]

## 2024-12-01 NOTE — Progress Notes (Signed)
"  °  Progress Note  Patient Name: Austin [name] Date of Encounter: 12/01/2024 [facility] Cardiologist: None   Interval Summary   Wanting to leave AMA.  Vital Signs Vitals:   12/01/24 0230 12/01/24 0415 12/01/24 0600 12/01/24 0620  BP: 93/75 91/67 (!) 95/46 107/71  Pulse: 90 91 64 82  Resp: 17 19 15 18   Temp: 98.4 F (36.9 C)  98.4 F (36.9 C)   TempSrc: Oral  Oral   SpO2: 97% 100% 100% 100%    Intake/Output Summary (Last 24 hours) at 12/01/2024 0858 Last data filed at 11/30/2024 1954 Gross per 24 hour  Intake 1000 ml  Output 1000 ml  Net 0 ml      11/26/2024   11:15 AM 11/03/2024    9:19 AM 10/12/2024    9:03 AM  Last 3 Weights  Weight (lbs) 138 lb 153 lb 1.6 oz 147 lb  Weight (kg) 62.596 kg 69.446 kg 66.679 kg      Telemetry/ECG  Atrial fibrillation controlled ventricular rates- Personally Reviewed  Physical Exam  GEN: No acute distress.   Neck: No JVD Cardiac: IRRR, no murmurs, rubs, or gallops.  Respiratory: Clear to auscultation bilaterally. GI: Soft, nontender, non-distended  MS: No edema  Patient Profile Patient with past medical history significant for ypertension, chronic systolic heart failure/NICM, atrial fibrillation, EtOH abuse, hyponatremia, adrenal insufficiency.  Admitted for episode of syncope  Assessment & Plan   Syncope Hypotension Patient presents with episode of syncope while at [facility].  States that he got up early this morning with his wife in anticipation of buying a television on sale.  He did not eat breakfast or drink anything, also skipped his morning dose of medications.  Elevated creatinine that has improved with IV fluids suggest presentation secondary to dehydration. EKG/telemetry overall benign.  However he already has a heart monitor evaluating atrial fibrillation burden that we can review. Adrenal insufficiency may be contributing as well.   Atrial fibrillation New diagnosis in the office on 12/16. Currently  rate controlled on admission. Continue Eliquis  5 mg twice daily Likely persistent.  Follow-up on heart monitor results and likely will need DCCV.  Reports no missed doses of his Eliquis .   Chronic systolic heart failure NICM -Known history with EF of 20 to 25% with brief recovery back in 2023 but recurrent drop.  Possibly due to EtOH use Is well compensated, hypotensive but feels warm and dry on exam. He is wanting to leave AMA.  GDMT has been held in light of dehydration/hypotension.  Ideally would have resumed slowly, but he is going to leave AMA.   Follow-up outpatient and resume meds back as tolerated.  Home GDMT:  metoprolol  XL, Lasix , losartan , Jardiance , Spiro, Dig  Of note, scheduled for upcoming CRT-D in January   Hyponatremia  Resolved.  Adrenal insufficiency Known to have low a.m. cortisol during 01/2024 admission and has been on hydrocortisone . Office notes indicate he has been referred to outpatient endocrinology  Alcohol  abuse Continues to drink alcohol  but he says of less quantity.  He said at [facility] he wanted to buy a 24 pack but that he would share.  Unclear exactly how much he is drinking.  However, reports compliancy with medications.   For questions or updates, please contact [facility] Please consult [email or web address] for contact info under       Signed, Thom LITTIE Sluder, PA-C    "

## 2024-12-06 ENCOUNTER — Telehealth (HOSPITAL_COMMUNITY): Payer: Self-pay

## 2024-12-06 NOTE — Telephone Encounter (Signed)
 Spoke with patient's wife, Austin [name], to discuss upcoming procedure.   Confirmed patient is scheduled for a Biventricular implantable cardioverter defibrillator on Tuesday, January 13 with Dr. Dr. Inocencio. Instructed patient to arrive at the Main Entrance A at [facility]: [address] [place], [place] [address] and check in at Admitting at 1:30 PM.   Labs completed  Any recent signs of acute illness or been started on antibiotics? Yes. Patient had a GI illness for 1 week, that resolved around 12/22. He had two ED visits for hyponatremia on 12/19 and syncope on 12/23 related to hypotension. She reports patient has since recovered and BP improved. Any new medications started? Yes; Mag Oxide 400 mg x 14 days   Any medications to hold? Yes  Medication instructions:  HOLD: Eliquis  (Apixaban ) for 3 days prior to your procedure. Your last dose will be Friday, January 9, PM dose.  On the morning of your procedure HOLD Spironolactone  and Lasix  (furosemide ). Continue taking Jardiance  due to medication is for CHF.  You may have a LIGHT breakfast prior to 7:00am on the morning of your procedure. Nothing to eat or drink after 7:00am except for sips of water  with your medications not discussed.   The night before your procedure and the morning of your procedure, wash thoroughly with the CHG surgical soap from the neck down, paying special attention to the area where your procedure will be performed.  Plan to go home the same day, you will only stay overnight if medically necessary. Please bring necessary items. You MUST have a responsible adult to drive you home and MUST be with you the first 24 hours after you arrive home.  Informed a nurse will call a day before the procedure to confirm arrival time and ensure instructions are followed.  Patient's wife, Austin [name], verbalized understanding to all instructions provided and agreed to proceed with procedure.

## 2024-12-10 ENCOUNTER — Other Ambulatory Visit (HOSPITAL_COMMUNITY): Payer: Self-pay | Admitting: Emergency Medicine

## 2024-12-10 NOTE — Progress Notes (Incomplete)
 "  ADVANCED HF CLINIC NOTE   PCP: Georgina Speaks, FNP HF Cardiology: Dr. Rolan  HPI: 69 y.o. with history of ETOH abuse HTN, chronic systolic CHF. nonischemic cardiomyopathy.   HF dates back to 2021. Echo 3/21 EF < 20% with severe RV dysfunction.  RHC/LHC in 3/21 showed no CAD but elevated R > L filling pressures, preserved cardiac index.  Cardiac MRI 4/21: LVEF 14%, RVEF 16%, no LGE  EF had improved to 50-55% in 5/23.  Echo (8/24) showed EF 25-30%, GIDD, RV normal. Suspected drop in EF 2/2 med noncompliance and ETOH.   Echo (12/24) showed EF 25-30.   Seen by EP in 1/25 CRT-D evaluation. EP felt that there might not be benefit of CRT-D. Did appear that LBBB is rate dependent, may better benefit from reduced rate.   Readmitted 2/25 with fall resulting in left hip fracture s/p left hip arthroplasty. Course c/b anemia and hypotension. Workup suggested adrenal insufficiency. Discharged home on hydrocortisone . Entresto , Toprol  XL and spironolactone  were discontinued.  Seen in clinic 03/11/24 and sent to ED for admission due to concern for low-output CHF. Had missed some medications while caring for his wife after a recent surgery.  Lactic acid elevated at 4.7. Austin [name] diuresed with milrinone  support. GDMT titrated. Austin [name] was enrolled in [facility] prior to discharge.  Echo in 4/25 showed EF 20-25%.    Echo 11/25 showed EF 20-25%, RV severely reduced, mild to moderate TR.  Seen for follow-up 12/19. Back in AF. Not taking medications as prescribed and missing paramedicine visits.  Presented to ED 11/30/24  Here today for close follow-up. Scheduled for CRT-D implant 12/21/24.   Austin [name] has gone back to work part time in the [employer]. Drinks beer on the weekends.   ReDs reading:   ECG (personally reviewed):   Labs (5/25): K 3.8, creatinine 1.09, BNP 300 Labs (10/25): K 4.0, creatinine 1.11  PMH: 1. Prostate cancer 2. HTN: BP now low.  3. Fe deficiency anemia 4. ETOH abuse 5. GERD 6.  Chronic systolic CHF: Nonischemic cardiomyopathy. ?Due to ETOH abuse.  - Echo 3/21 with EF < 20%, severe global HK, severely dilated and severely dysfunctional RV, severe biatrial enlargement.  - RHC/LHC (3/21): No CAD; mean RA 15, PA 51/24, mean PCWP 19, CI 3.96.  - HIV negative 4/21 - Cardiac MRI (4/21): LV EF 14%, moderate LV dilation, mild RV dilation with severely decreased systolic function, moderate pericardial effusion, no LGE noted but images difficult due to recent Fe infusion.  - Echo (8/21): EF 30-35%, diffuse hypokinesis, normal RV, no pericardial fluid.  - Echo (10/21): EF 45-50%, diffuse hypokinesis, normal RV.  - Echo (3/22): EF 45-50%, normal RV, PASP 34 mmHg.  - TEE (5/23): EF 50-55%, normal RV, trivial MR, mobile RA structure seen on TTE was [facility] network.  - Echo (8/24): EF 25-30%, GIDD, RV normal  - Echo (12/24): 25-30%, mod reduced RV, mild MR, mid TR. - Echo (4/25): EF 20-25%, global hypokinesis, moderate RV enlargement with moderate RV systolic dysfunction, no MR.  - Echo (11/25): EF 20-25%, severely reduced RV, mild to moderate TR  FH: Sister with CHF, brother with pacemaker, mother with CHF.   SH: Married, lives in [place], heavy ETOH in the past has now cut back, no drugs.  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   ROS: All systems reviewed and negative except as per HPI.   Current Outpatient Medications  Medication Sig Dispense Refill  apixaban  (ELIQUIS ) 5 MG TABS tablet Take 1 tablet (5 mg total) by mouth 2 (two) times daily. 180 tablet 3   digoxin  (LANOXIN ) 0.125 MG tablet Take 1 tablet (0.125 mg total) by mouth daily. 90 tablet 3   docusate sodium  (COLACE) 100 MG capsule Take 1 capsule (100 mg total) by mouth 2 (two) times daily. (Patient not taking: Reported on 11/26/2024) 10 capsule 0   ferrous sulfate  325 (65 FE) MG tablet Take 1 tablet (325 mg total) by mouth daily with breakfast. 30 tablet 3   furosemide  (LASIX ) 40  MG tablet Take 1.5 tablets (60 mg total) by mouth daily. 90 tablet 3   hydrocortisone  (CORTEF ) 5 MG tablet Take 1 tablet (5 mg total) by mouth daily. Start one daily after twice daily for 15 daily. 30 tablet 5   JARDIANCE  10 MG TABS tablet TAKE 1 TABLET BY MOUTH DAILY BEFORE BREAKFAST. 90 tablet 3   loperamide (IMODIUM) 2 MG capsule Take 2 mg by mouth every 6 (six) hours as needed for diarrhea or loose stools.     losartan  (COZAAR ) 25 MG tablet Take 1 tablet (25 mg total) by mouth daily. 90 tablet 3   magnesium  oxide (MAG-OX) 400 MG tablet Take 1 tablet (400 mg total) by mouth daily for 14 days. 14 tablet 0   metoprolol  succinate (TOPROL  XL) 25 MG 24 hr tablet Take 1 tablet (25 mg total) by mouth daily. 90 tablet 3   Multiple Vitamin (MULTIVITAMIN) tablet Take 1 tablet by mouth daily. (Patient not taking: Reported on 11/26/2024)     senna (SENOKOT) 8.6 MG TABS tablet Take 1 tablet (8.6 mg total) by mouth 2 (two) times daily. (Patient not taking: Reported on 11/26/2024) 120 tablet 0   sildenafil  (VIAGRA ) 25 MG tablet TAKE 2 TABLETS (50 MG TOTAL) BY MOUTH AS NEEDED. 10 tablet 0   spironolactone  (ALDACTONE ) 25 MG tablet Take 1 tablet (25 mg total) by mouth daily. 90 tablet 3   No current facility-administered medications for this visit.   Wt Readings from Last 3 Encounters:  11/26/24 62.6 kg (138 lb)  11/03/24 69.4 kg (153 lb 1.6 oz)  10/12/24 66.7 kg (147 lb)   There were no vitals taken for this visit.  Physical Exam General:  NAD. No resp difficulty, walked into clinic with cane, appears older than stated-age HEENT: Normal Neck: Supple. No JVD. Cor: Brady irregular rate & rhythm. No rubs, gallops or murmurs. Lungs: Clear Abdomen: Soft, nontender, nondistended.  Extremities: No cyanosis, clubbing, rash, edema Neuro: Alert & oriented x 3, moves all 4 extremities w/o difficulty. Affect pleasant.  Assessment/Plan: 1. Chronic systolic CHF: NICM, possibly due to ETOH.  Echo 3/21 EF 20%,  moderately decreased RV.  RHC/LHC 3/21 no significant coronary disease, preserved cardiac output, R>L heart failure.  Cardiac MRI in 4/21 showed EF 14%, severe RV dysfunction; no LGE but difficult LGE images.  TEE in 5/23 showed EF up to 50-55%, normal RV, trivial MR, mobile RA structure seen on TTE was [facility] network. Echo in 8/24 with drop in EF to 25-30% in setting of heavy ETOH intake and noncompliance with meds. Echo 12/24 EF 25-30% on GDMT. Echo 4/25 with EF 20-25%, RV mod reduced systolic function. 4/25 admission for low-output HF requiring inotrope support. Austin [name] says that Austin [name] has cut back on ETOH. Echo 11/25 showed EF 20-25%. NYHA class I-II, not volume overloaded on exam, weight down after recent GI illness, looks a little dry today. ReDs ok at 28%. - Continue Toprol   XL 25 mg daily. - Continue Lasix  60 mg daily.  - Continue losartan  25 mg daily, no BP room to transition to Entresto .  - Continue digoxin  0.125 mg daily. Check dig level today. - Continue Jardiance  10 mg daily. - Continue spironolactone  25 mg daily. BMET/BNP today. - Would not be a good candidate for VAD with RV dysfunction & poor medication compliance.  - Planning CRT-D 12/2024. 2. Atrial Fibrillation: HR 47 bpm at start of visit today. I confirmed HR in 40's via radial artery palpation. ECG showed atrial fibrillation with rate in 90's. This is new for him. - Start Eliquis  5 mg bid. Check CBC today. - Place 2 week Zio to quantify burden, by be paroxysmal. - Needs to cut back, ideally stop, ETOH. - Will send message to Dr. Inocencio regarding up-coming ICD placement. - See back in 2-3 weeks. With low EF, need to try to get him back in rhythm soon. AAD +/- DCCV. Discussed with Dr. Cherrie - Discuss sleep study next visit. 3. LBBB/IVCD: If repeat echo shows that EF is still low despite medication compliance and minimal ETOH, would favor CRT-D.  4. ETOH abuse: Long history of heavy ETOH. This may be the cause of his cardiomyopathy.   Austin [name] says that Austin [name] has cut back.  Needs to stop completely.  5. Hyponatremia: chronic. Na typically upper 120s-130s. - May be related to ETOH abuse. BMET today. 6. Adrenal insufficiency: Low am cortisol during 2/25 admission. Austin [name] was placed on hydrocortisone .  - Austin [name] has been referred to Endocrinology.  Discussed importance of medication compliance.   Follow up  [facility], Toi Stelly N, PA-C 12/10/2024  "

## 2024-12-10 NOTE — Progress Notes (Signed)
 Paramedicine Encounter    Patient ID: Austin [name], male    DOB: 1956/05/06, 69 y.o.   MRN: [id number]   Complaints NONE  Assessment A&O x 4, skin W&D w/ good color.  Pt. Denies chest pain or SOB.  Odor of ETOH on pt's breath. Empty 40 oz beer in the trash can.  Pt was recently put on Eliquis  5mg . BID due to new onset of A-fib.  He prescribed the Eliquis  on 11/26/24.  He picked up his prescription but never added to his pill box until today.    Compliance with meds NO  Pill box filled  x 2 weeks  Refills needed Hydrocortisone   Meds changes since last visit Added Eliquis  due to new onset of A-fib.      Social changes None   BP 90/60 (BP Location: Left Arm, Patient Position: Sitting, Cuff Size: Normal)   Pulse (!) 52   Resp 18   SpO2 99%  Weight yesterday- Last visit weight-138lb  Assisted pt in removing Zio heart monitor and packaged to mail.  I delivered this device personally to the post office for Mr. [place].  ACTION: Home visit completed  Mary Claudene Kennel [phone] 12/10/2024  Patient Care Team: Georgina Speaks, FNP as PCP - General (General Practice) Rolan Ezra RAMAN, MD as PCP - Advanced Heart Failure (Cardiology) Rolan Ezra RAMAN, MD as Consulting Physician (Cardiology)  Patient Active Problem List   Diagnosis Date Noted   Hypotension 11/30/2024   Syncope and collapse 11/30/2024   Hyponatremia 03/18/2024   Acute on chronic systolic CHF (congestive heart failure) (HCC) 03/11/2024   Abnormal levels of other serum enzymes 03/11/2024   Anemia 03/11/2024   Rectal bleeding 03/11/2024   ABLA (acute blood loss anemia) 01/13/2024   Adrenal insufficiency 01/12/2024   Closed left hip fracture (HCC) 01/10/2024   Transient hypotension 06/05/2023   Alcohol  use disorder 06/05/2023   Hypertensive 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 (HCC) 06/05/2023   Elevated brain natriuretic peptide (BNP) level 06/05/2023    Elevated LFTs 05/18/2023   Prolonged QT interval 05/18/2023   AKI (acute kidney injury) 05/17/2023   Iron deficiency anemia 09/03/2020   NICM (nonischemic cardiomyopathy) (HCC)    ETOH abuse 11/10/2018   Essential hypertension 11/10/2018   Blood in stool 11/10/2018   Current Medications[1] Allergies[2]   Social History   Socioeconomic History   Marital status: Married    Spouse name: Not on file   Number of children: 2   Years of education: Not on file   Highest education level: Bachelor's degree (e.g., BA, AB, BS)  Occupational History   Not on file  Tobacco Use   Smoking status: Never   Smokeless tobacco: Never  Vaping Use   Vaping status: Never Used  Substance and Sexual Activity   Alcohol  use: Not Currently    Comment: 1-3 beers daily   Drug use: No   Sexual activity: Yes  Other Topics Concern   Not on file  Social History Narrative   Not on file   Social Drivers of Health   Tobacco Use: Low Risk (11/26/2024)   Patient History    Smoking Tobacco Use: Never    Smokeless Tobacco Use: Never    Passive Exposure: Not on file  Financial Resource Strain: Low Risk (02/11/2024)   Overall Financial Resource Strain (CARDIA)    Difficulty of Paying Living Expenses: Not hard at all  Food Insecurity: No Food Insecurity (03/11/2024)   Hunger Vital Sign  Worried About Programme Researcher, Broadcasting/film/video in the Last Year: Never true    Ran Out of Food in the Last Year: Never true  Transportation Needs: No Transportation Needs (03/11/2024)   PRAPARE - Administrator, Civil Service (Medical): No    Lack of Transportation (Non-Medical): No  Physical Activity: Insufficiently Active (02/11/2024)   Exercise Vital Sign    Days of Exercise per Week: 2 days    Minutes of Exercise per Session: 40 min  Stress: No Stress Concern Present (02/11/2024)   [employer] of Occupational Health - Occupational Stress Questionnaire    Feeling of Stress : Not at all  Social Connections: Socially  Integrated (03/11/2024)   Social Connection and Isolation Panel    Frequency of Communication with Friends and Family: Never    Frequency of Social Gatherings with Friends and Family: More than three times a week    Attends Religious Services: More than 4 times per year    Active Member of Clubs or Organizations: Yes    Attends Banker Meetings: More than 4 times per year    Marital Status: Married  Catering Manager Violence: Not At Risk (03/11/2024)   Humiliation, Afraid, Rape, and Kick questionnaire    Fear of Current or Ex-Partner: No    Emotionally Abused: No    Physically Abused: No    Sexually Abused: No  Depression (PHQ2-9): Low Risk (02/11/2024)   Depression (PHQ2-9)    PHQ-2 Score: 0  Alcohol  Screen: Low Risk (02/11/2024)   Alcohol  Screen    Last Alcohol  Screening Score (AUDIT): 0  Housing: Low Risk (03/11/2024)   Housing Stability Vital Sign    Unable to Pay for Housing in the Last Year: No    Number of Times Moved in the Last Year: 0    Homeless in the Last Year: No  Utilities: Not At Risk (03/11/2024)   AHC Utilities    Threatened with loss of utilities: No  Health Literacy: Adequate Health Literacy (02/11/2024)   B1300 Health Literacy    Frequency of need for help with medical instructions: Never    Physical Exam      Future Appointments  Date Time Provider Department Center  12/20/2024  2:00 PM [facility] PA/NP [facility] None  03/16/2025  2:40 PM [facility]-ANNUAL WELLNESS VISIT [facility] [address]           [1]  Current Outpatient Medications:    apixaban  (ELIQUIS ) 5 MG TABS tablet, Take 1 tablet (5 mg total) by mouth 2 (two) times daily., Disp: 180 tablet, Rfl: 3   digoxin  (LANOXIN ) 0.125 MG tablet, Take 1 tablet (0.125 mg total) by mouth daily., Disp: 90 tablet, Rfl: 3   ferrous sulfate  325 (65 FE) MG tablet, Take 1 tablet (325 mg total) by mouth daily with breakfast., Disp: 30 tablet, Rfl: 3   furosemide  (LASIX ) 40 MG tablet, Take 1.5 tablets (60 mg  total) by mouth daily., Disp: 90 tablet, Rfl: 3   hydrocortisone  (CORTEF ) 5 MG tablet, Take 1 tablet (5 mg total) by mouth daily. Start one daily after twice daily for 15 daily., Disp: 30 tablet, Rfl: 5   JARDIANCE  10 MG TABS tablet, TAKE 1 TABLET BY MOUTH DAILY BEFORE BREAKFAST., Disp: 90 tablet, Rfl: 3   losartan  (COZAAR ) 25 MG tablet, Take 1 tablet (25 mg total) by mouth daily., Disp: 90 tablet, Rfl: 3   magnesium  oxide (MAG-OX) 400 MG tablet, Take 1 tablet (400 mg total) by mouth daily for 14  days., Disp: 14 tablet, Rfl: 0   metoprolol  succinate (TOPROL  XL) 25 MG 24 hr tablet, Take 1 tablet (25 mg total) by mouth daily., Disp: 90 tablet, Rfl: 3   sildenafil  (VIAGRA ) 25 MG tablet, TAKE 2 TABLETS (50 MG TOTAL) BY MOUTH AS NEEDED., Disp: 10 tablet, Rfl: 0   spironolactone  (ALDACTONE ) 25 MG tablet, Take 1 tablet (25 mg total) by mouth daily., Disp: 90 tablet, Rfl: 3   docusate sodium  (COLACE) 100 MG capsule, Take 1 capsule (100 mg total) by mouth 2 (two) times daily. (Patient not taking: Reported on 12/10/2024), Disp: 10 capsule, Rfl: 0   loperamide (IMODIUM) 2 MG capsule, Take 2 mg by mouth every 6 (six) hours as needed for diarrhea or loose stools. (Patient not taking: Reported on 12/10/2024), Disp: , Rfl:    Multiple Vitamin (MULTIVITAMIN) tablet, Take 1 tablet by mouth daily. (Patient not taking: Reported on 12/10/2024), Disp: , Rfl:    senna (SENOKOT) 8.6 MG TABS tablet, Take 1 tablet (8.6 mg total) by mouth 2 (two) times daily. (Patient not taking: Reported on 12/10/2024), Disp: 120 tablet, Rfl: 0 [2]  Allergies Allergen Reactions   Latex Swelling and Rash

## 2024-12-14 LAB — CBC
Hematocrit: 38.4 % (ref 37.5–51.0)
Hemoglobin: 12.6 g/dL — ABNORMAL LOW (ref 13.0–17.7)
MCH: 32.4 pg (ref 26.6–33.0)
MCHC: 32.8 g/dL (ref 31.5–35.7)
MCV: 99 fL — ABNORMAL HIGH (ref 79–97)
Platelets: 232 x10E3/uL (ref 150–450)
RBC: 3.89 x10E6/uL — ABNORMAL LOW (ref 4.14–5.80)
RDW: 13.3 % (ref 11.6–15.4)
WBC: 5.6 x10E3/uL (ref 3.4–10.8)

## 2024-12-14 LAB — BASIC METABOLIC PANEL WITH GFR
BUN/Creatinine Ratio: 12 (ref 10–24)
BUN: 13 mg/dL (ref 8–27)
CO2: 24 mmol/L (ref 20–29)
Calcium: 9.3 mg/dL (ref 8.6–10.2)
Chloride: 92 mmol/L — ABNORMAL LOW (ref 96–106)
Creatinine, Ser: 1.13 mg/dL (ref 0.76–1.27)
Glucose: 90 mg/dL (ref 70–99)
Potassium: 4.3 mmol/L (ref 3.5–5.2)
Sodium: 131 mmol/L — ABNORMAL LOW (ref 134–144)
eGFR: 70 mL/min/1.73

## 2024-12-14 NOTE — Telephone Encounter (Signed)
 Error

## 2024-12-15 ENCOUNTER — Telehealth: Payer: Self-pay

## 2024-12-15 ENCOUNTER — Other Ambulatory Visit (HOSPITAL_COMMUNITY): Payer: Self-pay | Admitting: Emergency Medicine

## 2024-12-15 NOTE — Telephone Encounter (Signed)
 Called Dede Smith, EMT-Paramedic while she was with the patient and informed her that the patient does not need to hold his Jardiance  - he will only need to hold Eliquis  x 3 days. Apologized for this miscommunication and confusion for the patient and his Wife, as there are changes being made to the procedural protocol.    Confirmed medication holds with Damien Maid, RN.

## 2024-12-15 NOTE — Telephone Encounter (Signed)
-----   Message from CMA Dede S sent at 12/14/2024  9:23 AM EST ----- Regarding: Procedure instructions Good Morning, I read the instruction letter for Mr. Silversmith for his 1/13 procedure. I see where his last dose of Eliquis  and Jardiance  should be 1/9.  At the home visit 1/2 pt's wife Lorrie asked me to confirm that he is to hold his Jardiance .  She says that someone told her that she didn't have to hold Jardiance  just Eliquis .  I feel confident both these meds should be held based on instructions but I did tell her that I would reach out and confirm.  I plan to see him on Thursday to make sure to pull these meds from his pill box.  Maeola Mary Sharps, EMT-Paramedic [phone] 12/14/2024

## 2024-12-15 NOTE — Telephone Encounter (Signed)
-----   Message from Nurse Maeola SQUIBB, RN sent at 12/14/2024  2:32 PM EST ----- Regarding: 12/21/2024  BiV ICD implant Precert:  MD: Camnitz Type of implant: CRT-D Device manufacturer: Abbott Diagnosis: cardiomyopathy CPT code: CRT-D implant - 66750+66774 C-code(s), including quantity (if indicated): 1882, 1898, 1777, 1900 Procedure scheduled (date/time): 12/21/24  3:30 pm  Procedure:  Scrub given? Yes  Medication instructions:  Message sent to [facility]? N/a Added to calendar? Yes Orders entered? Yes Letter complete? Yes Scheduled with cath lab? Yes Labs ordered (CBC, BMET, PT/INR if on warfarin)? Yes Dye allergy? No Pre-meds ordered and instructions given? N/a Letter method: Patient pick-up Special instructions:  H&P: 11/03/24  Follow-up:  Cassie/Angel, please schedule Routine.

## 2024-12-15 NOTE — Telephone Encounter (Signed)
 Pt will hold Eliquis  x 3 days

## 2024-12-15 NOTE — Progress Notes (Signed)
 Paramedicine Encounter    Patient ID: Austin [name], male    DOB: 1956-06-07, 69 y.o.   MRN: [id number]   Complaints NONE  Assessment A&O x 4, skin W&D w/ good color.  Pt. Denies chest pain or SOB.  Lung sounds clear and equal bilat.  No peripheral edema noted  Compliance with meds YES  Pill box filled x 1 week  Refills needed Hydrocortisone   Meds changes since last visit  Holding Eliquis  and Jardiance      Social changes NONE   BP 100/70 (BP Location: Left Arm, Patient Position: Sitting, Cuff Size: Normal)   Pulse 67   Resp 16   Wt 143 lb (64.9 kg)   SpO2 100%   BMI 20.52 kg/m  Weight yesterday-not taken Last visit weight-142lb  Had some questions regarding Austin [name]'s meds to hold for his pending procedure.  Called Dr. Inocencio office and spoke with April Garrison.  Final determination was for pt only hold Eliquis  3 days before procedure.  I will be ok for him to continue taking his Jardiance .  Pill box was reconciled to reflect this and I reviewed with pt and he advises he understands all instructions. Noted that today he took both doses of his Eliquis  in the morning and I advised him that he should take 1 in the morning and 1 in the evening as prescribed.  He advised he understands same.  ACTION: Home visit completed  Mary Claudene Kennel [phone] 12/15/2024  Patient Care Team: Georgina Speaks, FNP as PCP - General (General Practice) Rolan Ezra RAMAN, MD as PCP - Advanced Heart Failure (Cardiology) Rolan Ezra RAMAN, MD as Consulting Physician (Cardiology)  Patient Active Problem List   Diagnosis Date Noted   Hypotension 11/30/2024   Syncope and collapse 11/30/2024   Hyponatremia 03/18/2024   Acute on chronic systolic CHF (congestive heart failure) (HCC) 03/11/2024   Abnormal levels of other serum enzymes 03/11/2024   Anemia 03/11/2024   Rectal bleeding 03/11/2024   ABLA (acute blood loss anemia) 01/13/2024   Adrenal insufficiency 01/12/2024   Closed  left hip fracture (HCC) 01/10/2024   Transient hypotension 06/05/2023   Alcohol  use disorder 06/05/2023   Hypertensive 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 (HCC) 06/05/2023   Elevated brain natriuretic peptide (BNP) level 06/05/2023   Elevated LFTs 05/18/2023   Prolonged QT interval 05/18/2023   AKI (acute kidney injury) 05/17/2023   Iron deficiency anemia 09/03/2020   NICM (nonischemic cardiomyopathy) (HCC)    ETOH abuse 11/10/2018   Essential hypertension 11/10/2018   Blood in stool 11/10/2018   Current Medications[1] Allergies[2]   Social History   Socioeconomic History   Marital status: Married    Spouse name: Not on file   Number of children: 2   Years of education: Not on file   Highest education level: Bachelor's degree (e.g., BA, AB, BS)  Occupational History   Not on file  Tobacco Use   Smoking status: Never   Smokeless tobacco: Never  Vaping Use   Vaping status: Never Used  Substance and Sexual Activity   Alcohol  use: Not Currently    Comment: 1-3 beers daily   Drug use: No   Sexual activity: Yes  Other Topics Concern   Not on file  Social History Narrative   Not on file   Social Drivers of Health   Tobacco Use: Low Risk (11/26/2024)   Patient History    Smoking Tobacco Use: Never    Smokeless Tobacco Use:  Never    Passive Exposure: Not on file  Financial Resource Strain: Low Risk (02/11/2024)   Overall Financial Resource Strain (CARDIA)    Difficulty of Paying Living Expenses: Not hard at all  Food Insecurity: No Food Insecurity (03/11/2024)   Hunger Vital Sign    Worried About Running Out of Food in the Last Year: Never true    Ran Out of Food in the Last Year: Never true  Transportation Needs: No Transportation Needs (03/11/2024)   PRAPARE - Administrator, Civil Service (Medical): No    Lack of Transportation (Non-Medical): No  Physical Activity: Insufficiently Active (02/11/2024)   Exercise  Vital Sign    Days of Exercise per Week: 2 days    Minutes of Exercise per Session: 40 min  Stress: No Stress Concern Present (02/11/2024)   [employer] of Occupational Health - Occupational Stress Questionnaire    Feeling of Stress : Not at all  Social Connections: Socially Integrated (03/11/2024)   Social Connection and Isolation Panel    Frequency of Communication with Friends and Family: Never    Frequency of Social Gatherings with Friends and Family: More than three times a week    Attends Religious Services: More than 4 times per year    Active Member of Clubs or Organizations: Yes    Attends Banker Meetings: More than 4 times per year    Marital Status: Married  Catering Manager Violence: Not At Risk (03/11/2024)   Humiliation, Afraid, Rape, and Kick questionnaire    Fear of Current or Ex-Partner: No    Emotionally Abused: No    Physically Abused: No    Sexually Abused: No  Depression (PHQ2-9): Low Risk (02/11/2024)   Depression (PHQ2-9)    PHQ-2 Score: 0  Alcohol  Screen: Low Risk (02/11/2024)   Alcohol  Screen    Last Alcohol  Screening Score (AUDIT): 0  Housing: Low Risk (03/11/2024)   Housing Stability Vital Sign    Unable to Pay for Housing in the Last Year: No    Number of Times Moved in the Last Year: 0    Homeless in the Last Year: No  Utilities: Not At Risk (03/11/2024)   AHC Utilities    Threatened with loss of utilities: No  Health Literacy: Adequate Health Literacy (02/11/2024)   B1300 Health Literacy    Frequency of need for help with medical instructions: Never    Physical Exam      Future Appointments  Date Time Provider Department Center  12/20/2024  2:00 PM [facility] PA/NP [facility] None  03/16/2025  2:40 PM [facility]-ANNUAL WELLNESS VISIT [facility] [id number]           [1]  Current Outpatient Medications:    apixaban  (ELIQUIS ) 5 MG TABS tablet, Take 1 tablet (5 mg total) by mouth 2 (two) times daily., Disp: 180 tablet, Rfl: 3   digoxin   (LANOXIN ) 0.125 MG tablet, Take 1 tablet (0.125 mg total) by mouth daily., Disp: 90 tablet, Rfl: 3   docusate sodium  (COLACE) 100 MG capsule, Take 1 capsule (100 mg total) by mouth 2 (two) times daily. (Patient not taking: Reported on 12/10/2024), Disp: 10 capsule, Rfl: 0   ferrous sulfate  325 (65 FE) MG tablet, Take 1 tablet (325 mg total) by mouth daily with breakfast., Disp: 30 tablet, Rfl: 3   furosemide  (LASIX ) 40 MG tablet, Take 1.5 tablets (60 mg total) by mouth daily., Disp: 90 tablet, Rfl: 3   hydrocortisone  (CORTEF ) 5 MG tablet, Take 1  tablet (5 mg total) by mouth daily. Start one daily after twice daily for 15 daily., Disp: 30 tablet, Rfl: 5   JARDIANCE  10 MG TABS tablet, TAKE 1 TABLET BY MOUTH DAILY BEFORE BREAKFAST., Disp: 90 tablet, Rfl: 3   loperamide (IMODIUM) 2 MG capsule, Take 2 mg by mouth every 6 (six) hours as needed for diarrhea or loose stools. (Patient not taking: Reported on 12/10/2024), Disp: , Rfl:    losartan  (COZAAR ) 25 MG tablet, Take 1 tablet (25 mg total) by mouth daily., Disp: 90 tablet, Rfl: 3   metoprolol  succinate (TOPROL  XL) 25 MG 24 hr tablet, Take 1 tablet (25 mg total) by mouth daily., Disp: 90 tablet, Rfl: 3   Multiple Vitamin (MULTIVITAMIN) tablet, Take 1 tablet by mouth daily. (Patient not taking: Reported on 12/10/2024), Disp: , Rfl:    senna (SENOKOT) 8.6 MG TABS tablet, Take 1 tablet (8.6 mg total) by mouth 2 (two) times daily. (Patient not taking: Reported on 12/10/2024), Disp: 120 tablet, Rfl: 0   sildenafil  (VIAGRA ) 25 MG tablet, TAKE 2 TABLETS (50 MG TOTAL) BY MOUTH AS NEEDED., Disp: 10 tablet, Rfl: 0   spironolactone  (ALDACTONE ) 25 MG tablet, Take 1 tablet (25 mg total) by mouth daily., Disp: 90 tablet, Rfl: 3 [2]  Allergies Allergen Reactions   Latex Swelling and Rash

## 2024-12-17 ENCOUNTER — Telehealth (HOSPITAL_COMMUNITY): Payer: Self-pay

## 2024-12-17 NOTE — Telephone Encounter (Signed)
 Called to confirm/remind patient of their appointment at the [facility] on 12/17/24.   Appointment:   [x] Confirmed  [] Left mess   [] No answer/No voice mail  [] VM Full/unable to leave message  [] Phone not in service  Patient reminded to bring all medications and/or complete list.  Confirmed patient has transportation. Gave directions, instructed to utilize valet parking.

## 2024-12-20 ENCOUNTER — Encounter (HOSPITAL_COMMUNITY): Payer: Self-pay

## 2024-12-20 ENCOUNTER — Telehealth (HOSPITAL_COMMUNITY): Payer: Self-pay | Admitting: Physician Assistant

## 2024-12-20 ENCOUNTER — Ambulatory Visit (HOSPITAL_COMMUNITY)
Admission: RE | Admit: 2024-12-20 | Discharge: 2024-12-20 | Disposition: A | Source: Ambulatory Visit | Attending: Physician Assistant

## 2024-12-20 VITALS — BP 114/64 | HR 79 | Wt 149.0 lb

## 2024-12-20 DIAGNOSIS — Z7901 Long term (current) use of anticoagulants: Secondary | ICD-10-CM | POA: Diagnosis not present

## 2024-12-20 DIAGNOSIS — F1010 Alcohol abuse, uncomplicated: Secondary | ICD-10-CM | POA: Diagnosis not present

## 2024-12-20 DIAGNOSIS — I5022 Chronic systolic (congestive) heart failure: Secondary | ICD-10-CM | POA: Diagnosis not present

## 2024-12-20 DIAGNOSIS — I447 Left bundle-branch block, unspecified: Secondary | ICD-10-CM | POA: Insufficient documentation

## 2024-12-20 DIAGNOSIS — I428 Other cardiomyopathies: Secondary | ICD-10-CM | POA: Diagnosis not present

## 2024-12-20 DIAGNOSIS — I4891 Unspecified atrial fibrillation: Secondary | ICD-10-CM | POA: Diagnosis not present

## 2024-12-20 DIAGNOSIS — Z96642 Presence of left artificial hip joint: Secondary | ICD-10-CM | POA: Insufficient documentation

## 2024-12-20 DIAGNOSIS — Z79899 Other long term (current) drug therapy: Secondary | ICD-10-CM | POA: Insufficient documentation

## 2024-12-20 DIAGNOSIS — E871 Hypo-osmolality and hyponatremia: Secondary | ICD-10-CM | POA: Diagnosis not present

## 2024-12-20 DIAGNOSIS — I4819 Other persistent atrial fibrillation: Secondary | ICD-10-CM | POA: Diagnosis not present

## 2024-12-20 DIAGNOSIS — E2740 Unspecified adrenocortical insufficiency: Secondary | ICD-10-CM | POA: Diagnosis not present

## 2024-12-20 DIAGNOSIS — I110 Hypertensive heart disease with heart failure: Secondary | ICD-10-CM | POA: Insufficient documentation

## 2024-12-20 DIAGNOSIS — Z7984 Long term (current) use of oral hypoglycemic drugs: Secondary | ICD-10-CM | POA: Insufficient documentation

## 2024-12-20 NOTE — Pre-Procedure Instructions (Signed)
 Spoke with patient's wife Lorie.  Reviewed the following items: Arrival time 1300 Nothing to eat or drink after midnight No meds AM of procedure Responsible person to drive you home and stay with you for 24 hrs Wash with special soap night before and morning of procedure If on anti-coagulant drug instructions Eliquis -  stopped on 12/17/24

## 2024-12-20 NOTE — Telephone Encounter (Signed)
 Patient had elevated digoxin  level last month. Please have him come back within the next week for lab, digoxin  level. Needs to hold digoxin  am of lab.

## 2024-12-20 NOTE — Telephone Encounter (Signed)
Pt aware via wife 

## 2024-12-20 NOTE — Patient Instructions (Addendum)
 Follow-Up in: 4 WEEKS as scheduled   At the [facility], you and your health needs are our priority. We have a designated team specialized in the treatment of Heart Failure. This Care Team includes your primary Heart Failure Specialized Cardiologist (physician), Advanced Practice Providers (APPs- Physician Assistants and Nurse Practitioners), and Pharmacist who all work together to provide you with the care you need, when you need it.   You may see any of the following providers on your designated Care Team at your next follow up:  Dr. Toribio Fuel Dr. [name] Dr. Odis Brownie Greig Mosses, NP Caffie Shed, [place] [facility] [place], [place] Beckey Coe, NP Jordan Lee, NP Tinnie Redman, PharmD   Please be sure to bring in all your medications bottles to every appointment.   Need to Contact Us :  If you have any questions or concerns before your next appointment please send us  a message through [place] or call our office at [phone].    TO LEAVE A MESSAGE FOR THE NURSE SELECT OPTION 2, PLEASE LEAVE A MESSAGE INCLUDING: YOUR NAME DATE OF BIRTH CALL BACK NUMBER REASON FOR CALL**this is important as we prioritize the call backs  YOU WILL RECEIVE A CALL BACK THE SAME DAY AS LONG AS YOU CALL BEFORE 4:00 PM

## 2024-12-21 ENCOUNTER — Telehealth: Payer: Self-pay | Admitting: Cardiology

## 2024-12-21 ENCOUNTER — Ambulatory Visit (HOSPITAL_COMMUNITY): Admission: RE | Admit: 2024-12-21 | Source: Home / Self Care | Admitting: Cardiology

## 2024-12-21 ENCOUNTER — Ambulatory Visit (HOSPITAL_COMMUNITY)
Admission: RE | Disposition: A | Discharge status: Home / Self Care | Payer: Self-pay | Source: Home / Self Care | Attending: Cardiology

## 2024-12-21 ENCOUNTER — Other Ambulatory Visit: Payer: Self-pay

## 2024-12-21 ENCOUNTER — Ambulatory Visit (HOSPITAL_COMMUNITY)

## 2024-12-21 DIAGNOSIS — I5022 Chronic systolic (congestive) heart failure: Secondary | ICD-10-CM | POA: Diagnosis not present

## 2024-12-21 DIAGNOSIS — I110 Hypertensive heart disease with heart failure: Secondary | ICD-10-CM | POA: Insufficient documentation

## 2024-12-21 DIAGNOSIS — I447 Left bundle-branch block, unspecified: Secondary | ICD-10-CM | POA: Diagnosis not present

## 2024-12-21 DIAGNOSIS — I255 Ischemic cardiomyopathy: Secondary | ICD-10-CM | POA: Diagnosis not present

## 2024-12-21 HISTORY — PX: BIV ICD INSERTION CRT-D: EP1195

## 2024-12-21 HISTORY — PX: LEAD INSERTION: EP1212

## 2024-12-21 SURGERY — BIV ICD INSERTION CRT-D

## 2024-12-21 MED ORDER — POVIDONE-IODINE 10 % EX SWAB: 2.0000 | Freq: Once | CUTANEOUS | Status: DC

## 2024-12-21 MED ORDER — MIDAZOLAM HCL 5 MG/5ML IJ SOLN
INTRAMUSCULAR | Status: DC | PRN
  Administered 2024-12-21: 1 mg via INTRAVENOUS

## 2024-12-21 MED ORDER — HEPARIN (PORCINE) IN NACL 1000-0.9 UT/500ML-% IV SOLN
INTRAVENOUS | Status: DC | PRN
  Administered 2024-12-21: 500 mL

## 2024-12-21 MED ORDER — CHLORHEXIDINE GLUCONATE 4 % EX SOLN
4.0000 | Freq: Once | CUTANEOUS | Status: DC
  Filled 2024-12-21: qty 60

## 2024-12-21 MED ORDER — CEFAZOLIN SODIUM-DEXTROSE 2-4 GM/100ML-% IV SOLN
2.0000 g | INTRAVENOUS | Status: AC
  Administered 2024-12-21: 2 g via INTRAVENOUS

## 2024-12-21 MED ORDER — FENTANYL CITRATE (PF) 100 MCG/2ML IJ SOLN
INTRAMUSCULAR | Status: DC | PRN
  Administered 2024-12-21: 25 ug via INTRAVENOUS

## 2024-12-21 MED ORDER — CEFAZOLIN SODIUM-DEXTROSE 2-4 GM/100ML-% IV SOLN
INTRAVENOUS | Status: AC
  Filled 2024-12-21: qty 100

## 2024-12-21 MED ORDER — SODIUM CHLORIDE 0.9 % IV SOLN
INTRAVENOUS | Status: AC
  Administered 2024-12-21: 80 mg
  Filled 2024-12-21: qty 2

## 2024-12-21 MED ORDER — ACETAMINOPHEN 325 MG PO TABS
ORAL_TABLET | ORAL | Status: AC
  Filled 2024-12-21: qty 2

## 2024-12-21 MED ORDER — IOHEXOL 350 MG/ML SOLN
INTRAVENOUS | Status: DC | PRN
  Administered 2024-12-21: 10 mL

## 2024-12-21 MED ORDER — LIDOCAINE HCL 1 % IJ SOLN
INTRAMUSCULAR | Status: AC
  Filled 2024-12-21: qty 60

## 2024-12-21 MED ORDER — ONDANSETRON HCL 4 MG/2ML IJ SOLN: 4.0000 mg | Freq: Four times a day (QID) | INTRAMUSCULAR | Status: DC | PRN

## 2024-12-21 MED ORDER — ACETAMINOPHEN 325 MG PO TABS
325.0000 mg | ORAL_TABLET | ORAL | Status: DC | PRN
  Administered 2024-12-21: 650 mg via ORAL

## 2024-12-21 MED ORDER — SODIUM CHLORIDE 0.9 % IV SOLN: INTRAVENOUS | Status: DC

## 2024-12-21 MED ORDER — MIDAZOLAM HCL 2 MG/2ML IJ SOLN
INTRAMUSCULAR | Status: AC
  Filled 2024-12-21: qty 2

## 2024-12-21 MED ORDER — SODIUM CHLORIDE 0.9 % IV SOLN: 80.0000 mg | INTRAVENOUS | Status: AC

## 2024-12-21 MED ORDER — FENTANYL CITRATE (PF) 100 MCG/2ML IJ SOLN
INTRAMUSCULAR | Status: AC
  Filled 2024-12-21: qty 2

## 2024-12-21 MED ORDER — LIDOCAINE HCL (PF) 1 % IJ SOLN
INTRAMUSCULAR | Status: DC | PRN
  Administered 2024-12-21: 45 mL

## 2024-12-21 SURGICAL SUPPLY — 16 items
BALLOON COR SINUS VENO 6FR 80 (BALLOONS) IMPLANT
CABLE SURGICAL S-101-97-12 (CABLE) ×1 IMPLANT
CATH CPS DIRECT 135 DS2C020 (CATHETERS) IMPLANT
ICD GALLANT HFCRTD CDHFA500Q (ICD Generator) IMPLANT
KIT ESSENTIALS PG (KITS) IMPLANT
LEAD DURATA 7122Q-65CM (Lead) IMPLANT
LEAD QUARTET 1458QL-86 (Lead) IMPLANT
LEAD ULTIPACE 52 LPA1231/52 (Lead) IMPLANT
PAD DEFIB RADIO PHYSIO CONN (PAD) ×1 IMPLANT
SHEATH 7FR PRELUDE SNAP 13 (SHEATH) IMPLANT
SHEATH PROBE COVER 6X72 (BAG) IMPLANT
SLITTER AGILIS HISPRO (INSTRUMENTS) IMPLANT
TRAY PACEMAKER INSERTION (PACKS) ×1 IMPLANT
WIRE ACUITY WHISPER EDS 4648 (WIRE) IMPLANT
WIRE HI TORQ VERSACORE-J 145CM (WIRE) IMPLANT
WIRE MAILMAN 182CM (WIRE) IMPLANT

## 2024-12-21 NOTE — Telephone Encounter (Signed)
 Patient spouse is requesting a work note for their son because he is staying with them to help take care of the patient for the procedure today. Please advise

## 2024-12-21 NOTE — Progress Notes (Signed)
 Discharge instructions reviewed with patient and wife at bedside. Denies questions concerns. PT tolerated PO intake. Ambulated in the hallway, was able to void without difficulty. Seen by MD, device rep, received an EKG and Chest Xray before discharge. Incision site remains clean dry and intact. No s/s of complications. PT escorted from the unit via wheel chair to personal vehicle.

## 2024-12-21 NOTE — Telephone Encounter (Signed)
 Advised wife to inform hospital staff, while there today, and our team will get a work note together for their son. She appreciates the return call and agreeable to plan.

## 2024-12-21 NOTE — Discharge Instructions (Signed)
 After Your ICD (Implantable Cardiac Defibrillator)   You have a Abbott ICD  If you have a Medtronic or Biotronik device, plug in your home monitor once you get home, and no manual interaction is required.   If you have an Abbott or [employer]ozone device, plug your home monitor once you get home, sit near the device, and press the large activation button. Sit nearby until the process is complete, usually notated by lights on the monitor.   If you were set up for monitoring using an app on your phone, make sure the app remains open in the background and the Bluetooth remains on.  ACTIVITY Do not lift your arm above shoulder height for 1 week after your procedure. After 7 days, you may progress as below.  You should remove your sling 24 hours after your procedure, unless otherwise instructed by your provider.     Tuesday December 28, 2024  Wednesday December 29, 2024 Thursday December 30, 2024 Friday December 31, 2024   Do not lift, push, pull, or carry anything over 10 pounds with the affected arm until 6 weeks (Tuesday February 01, 2025 ) after your procedure.   You may drive AFTER your wound check, UNLESS you have been told otherwise by your provider.   Ask your healthcare provider when you can go back to work   INCISION/Dressing If you are on a blood thinner such as Coumadin, Xarelto, Eliquis , Plavix, or Pradaxa please confirm with your provider when this should be resumed.   If large square, outer bandage is left in place, this can be removed after 24 hours from your procedure. Do not remove steri-strips or glue as below.   Monitor your defibrillator site for redness, swelling, and drainage. Call the device clinic at [phone] if you experience these symptoms or fever/chills.    If your incision is sealed with Steri-strips or staples, you may shower 7 days after your procedure or when told by your provider. Do not remove the steri-strips or let the shower hit directly on your  site. You may wash around your site with soap and water .    If you were discharged in a sling, please do not wear this during the day more than 48 hours after your surgery unless otherwise instructed. This may increase the risk of stiffness and soreness in your shoulder.   Avoid lotions, ointments, or perfumes over your incision until it is well-healed.  You may use a hot tub or a pool AFTER your wound check appointment if the incision is completely closed.  Your ICD is designed to protect you from life threatening heart rhythms. Because of this, you may receive a shock.   1 shock with no symptoms:  Call the office during business hours. 1 shock with symptoms (chest pain, chest pressure, dizziness, lightheadedness, shortness of breath, overall feeling unwell):  Call 911. If you experience 2 or more shocks in 24 hours:  Call 911. If you receive a shock, you should not drive for 6 months per the [place] DMV IF you receive appropriate therapy from your ICD.   ICD Alerts:  Some alerts are vibratory and others beep. These are NOT emergencies. Please call our office to let us  know. If this occurs at night or on weekends, it can wait until the next business day. Send a remote transmission.  If your device is capable of reading fluid status (for heart failure), you will be offered monthly monitoring to review this with you.   DEVICE MANAGEMENT  Remote monitoring is used to monitor your ICD from home. This monitoring is scheduled every 91 days by our office. It allows us  to keep an eye on the functioning of your device to ensure it is working properly. You will routinely see your Electrophysiologist annually (more often if necessary). This will appear as a REMOTE check on your MyChart schedule. These are automatic and there is nothing for you to manually do unless otherwise instructed.  You should receive your ID card for your new device in 4-8 weeks. Keep this card with you at all times once received.  Consider wearing a medical alert bracelet or necklace.  Your ICD  may be MRI compatible. This will be discussed at your next office visit/wound check.  You should avoid contact with strong electric or magnetic fields.   Do not use amateur (ham) radio equipment or electric (arc) welding torches. MP3 player headphones with magnets should not be used. Some devices are safe to use if held at least 12 inches (30 cm) from your defibrillator. These include power tools, lawn mowers, and speakers. If you are unsure if something is safe to use, ask your health care provider.  When using your cell phone, hold it to the ear that is on the opposite side from the defibrillator. Do not leave your cell phone in a pocket over the defibrillator.  You may safely use electric blankets, heating pads, computers, and microwave ovens.  Call the office right away if: You have chest pain. You feel more than one shock. You feel more short of breath than you have felt before. You feel more light-headed than you have felt before. Your incision starts to open up.  This information is not intended to replace advice given to you by your health care provider. Make sure you discuss any questions you have with your health care provider.

## 2024-12-21 NOTE — H&P (Signed)
" °  Electrophysiology Office Note:   Date:  12/21/2024  ID:  Austin [name], DOB 08/05/1956, MRN [id number]  Primary Cardiologist: None Primary Heart Failure: [name], MD Electrophysiologist: None      History of Present Illness:   Austin [name] is a 69 y.o. male with h/o chronic systolic heart failure, alcohol  abuse, hypertension seen today for routine electrophysiology followup.  Today, denies symptoms of palpitations, chest pain, dyspnea, orthopnea, PND, lower extremity edema, claudication, dizziness, presyncope, syncope, bleeding, or neurologic sequela. The patient is tolerating medications without difficulties. Plan CRT-D implant today.   EP Information / Studies Reviewed:    EKG is not ordered today. EKG from 09/14/2024 reviewed which showed sinus rhythm, IVCD        Risk Assessment/Calculations:            Physical Exam:   VS:  There were no vitals taken for this visit.   Wt Readings from Last 3 Encounters:  12/20/24 67.6 kg  12/15/24 64.9 kg  12/10/24 64.5 kg    GEN: Well nourished, well developed in no acute distress NECK: No JVD; No carotid bruits CARDIAC: Regular rate and rhythm, no murmurs, rubs, gallops RESPIRATORY:  Clear to auscultation without rales, wheezing or rhonchi  ABDOMEN: Soft, non-tender, non-distended EXTREMITIES:  No edema; No deformity    ASSESSMENT AND PLAN:    1.  Chronic static heart failure: Austin [name] has presented today for surgery, with the diagnosis of CHF.  The various methods of treatment have been discussed with the patient and family. After consideration of risks, benefits and other options for treatment, the patient has consented to  Procedure(s): CRT-D implant as a surgical intervention .  Risks include but not limited to bleeding, infection, pneumothorax, perforation, tamponade, vascular damage, renal failure, MI, stroke, death, and lead dislodgement . The patient's history has been reviewed, patient examined, no change  in status, stable for surgery.  I have reviewed the patient's chart and labs.  Questions were answered to the patient's satisfaction.    [name], MD 12/21/2024 1:40 PM  "

## 2024-12-22 ENCOUNTER — Telehealth (HOSPITAL_COMMUNITY): Payer: Self-pay | Admitting: Emergency Medicine

## 2024-12-22 ENCOUNTER — Encounter (HOSPITAL_COMMUNITY): Payer: Self-pay | Admitting: Cardiology

## 2024-12-22 MED FILL — Lidocaine HCl Local Inj 1%: INTRAMUSCULAR | Qty: 45 | Status: AC

## 2024-12-22 MED FILL — Midazolam HCl Inj 2 MG/2ML (Base Equivalent): INTRAMUSCULAR | Qty: 1 | Status: AC

## 2024-12-22 NOTE — Telephone Encounter (Signed)
 Called and confirmed paramedicine home visit for tomorrow 1/15 @ 9:00. Mrs. Harkins confirmed same.    Mary Sharps, EMT-Paramedic [phone] 12/22/2024

## 2024-12-23 ENCOUNTER — Other Ambulatory Visit (HOSPITAL_COMMUNITY): Payer: Self-pay | Admitting: Emergency Medicine

## 2024-12-23 NOTE — Progress Notes (Signed)
 Paramedicine Encounter    Patient ID: Austin [name], male    DOB: Jul 31, 1956, 69 y.o.   MRN: [id number]   Complaints NONE  Assessment A&O x 4, skin W&D w/ good color.  Denies chest pain or SOB.  Lung sounds clear bilat.  No peripheral edema noted.  ICD surgical site looks good.  No redness, swelling, not hot to touch.  Med rec x 1 week.  Restarts Eliquis  Saturday 1/17.  Pt will pick up refill of Hydrocortisone  today and needs 1 to put in Thursdays morning spot. Reviewed upcoming appointments and pt aware of same. Next home visit 1/21/.  Has an 11:30 [facility] visit this day as well  Compliance with meds YES  Pill box filled x 1 week  Refills needed Hydrocortisone  and Multivitamin  Meds changes since last visit NONE    Social changes NONE   BP 90/60 (BP Location: Left Arm, Patient Position: Sitting, Cuff Size: Normal)   Pulse 80 Comment: irrregular  Resp 14   Wt 143 lb 9.6 oz (65.1 kg)   SpO2 98%   BMI 22.49 kg/m  Weight yesterday- Last visit weight-143lb  ACTION: Home visit completed  Mary Claudene Kennel [phone] 12/23/24  Patient Care Team: Georgina Speaks, FNP as PCP - General (General Practice) Rolan Ezra RAMAN, MD as PCP - Advanced Heart Failure (Cardiology) Rolan Ezra RAMAN, MD as Consulting Physician (Cardiology)  Patient Active Problem List   Diagnosis Date Noted   Hypotension 11/30/2024   Syncope and collapse 11/30/2024   Hyponatremia 03/18/2024   Acute on chronic systolic CHF (congestive heart failure) (HCC) 03/11/2024   Abnormal levels of other serum enzymes 03/11/2024   Anemia 03/11/2024   Rectal bleeding 03/11/2024   ABLA (acute blood loss anemia) 01/13/2024   Adrenal insufficiency 01/12/2024   Closed left hip fracture (HCC) 01/10/2024   Transient hypotension 06/05/2023   Alcohol  use disorder 06/05/2023   Hypertensive 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 (HCC) 06/05/2023   Elevated brain  natriuretic peptide (BNP) level 06/05/2023   Elevated LFTs 05/18/2023   Prolonged QT interval 05/18/2023   AKI (acute kidney injury) 05/17/2023   Iron deficiency anemia 09/03/2020   NICM (nonischemic cardiomyopathy) (HCC)    ETOH abuse 11/10/2018   Essential hypertension 11/10/2018   Blood in stool 11/10/2018   Current Medications[1] Allergies[2]   Social History   Socioeconomic History   Marital status: Married    Spouse name: Not on file   Number of children: 2   Years of education: Not on file   Highest education level: Bachelor's degree (e.g., BA, AB, BS)  Occupational History   Not on file  Tobacco Use   Smoking status: Never   Smokeless tobacco: Never  Vaping Use   Vaping status: Never Used  Substance and Sexual Activity   Alcohol  use: Not Currently    Comment: 1-3 beers daily   Drug use: No   Sexual activity: Yes  Other Topics Concern   Not on file  Social History Narrative   Not on file   Social Drivers of Health   Tobacco Use: Low Risk (12/20/2024)   Patient History    Smoking Tobacco Use: Never    Smokeless Tobacco Use: Never    Passive Exposure: Not on file  Financial Resource Strain: Low Risk (02/11/2024)   Overall Financial Resource Strain (CARDIA)    Difficulty of Paying Living Expenses: Not hard at all  Food Insecurity: No Food Insecurity (03/11/2024)   Hunger  Vital Sign    Worried About Programme Researcher, Broadcasting/film/video in the Last Year: Never true    Ran Out of Food in the Last Year: Never true  Transportation Needs: No Transportation Needs (03/11/2024)   PRAPARE - Administrator, Civil Service (Medical): No    Lack of Transportation (Non-Medical): No  Physical Activity: Insufficiently Active (02/11/2024)   Exercise Vital Sign    Days of Exercise per Week: 2 days    Minutes of Exercise per Session: 40 min  Stress: No Stress Concern Present (02/11/2024)   [employer] of Occupational Health - Occupational Stress Questionnaire    Feeling of Stress  : Not at all  Social Connections: Socially Integrated (03/11/2024)   Social Connection and Isolation Panel    Frequency of Communication with Friends and Family: Never    Frequency of Social Gatherings with Friends and Family: More than three times a week    Attends Religious Services: More than 4 times per year    Active Member of Clubs or Organizations: Yes    Attends Banker Meetings: More than 4 times per year    Marital Status: Married  Catering Manager Violence: Not At Risk (03/11/2024)   Humiliation, Afraid, Rape, and Kick questionnaire    Fear of Current or Ex-Partner: No    Emotionally Abused: No    Physically Abused: No    Sexually Abused: No  Depression (PHQ2-9): Low Risk (02/11/2024)   Depression (PHQ2-9)    PHQ-2 Score: 0  Alcohol  Screen: Low Risk (02/11/2024)   Alcohol  Screen    Last Alcohol  Screening Score (AUDIT): 0  Housing: Low Risk (03/11/2024)   Housing Stability Vital Sign    Unable to Pay for Housing in the Last Year: No    Number of Times Moved in the Last Year: 0    Homeless in the Last Year: No  Utilities: Not At Risk (03/11/2024)   AHC Utilities    Threatened with loss of utilities: No  Health Literacy: Adequate Health Literacy (02/11/2024)   B1300 Health Literacy    Frequency of need for help with medical instructions: Never    Physical Exam      Future Appointments  Date Time Provider Department Center  12/29/2024 11:30 AM [facility] LAB [facility] None  01/04/2025 11:20 AM [facility] DEVICE 1 [facility]  01/19/2025  9:00 AM [facility] PA/NP [facility] None  02/03/2025  7:00 AM [facility] H[facility] DEVICE REMOTES [facility] H&[facility]  03/16/2025  2:40 PM [facility]-ANNUAL WELLNESS VISIT [facility] [address]  03/22/2025  1:30 PM Leverne Charlies Helling, PA-C [facility]&V  05/05/2025  7:00 AM [facility] H[facility] DEVICE REMOTES [facility]  08/04/2025  7:00 AM [facility] DEVICE REMOTES [facility]  11/03/2025  7:00 AM [facility] DEVICE REMOTES [facility]  02/02/2026  7:00 AM [facility] H[facility] DEVICE REMOTES  [facility]          [1]  Current Outpatient Medications:    apixaban  (ELIQUIS ) 5 MG TABS tablet, Take 1 tablet (5 mg total) by mouth 2 (two) times daily., Disp: 180 tablet, Rfl: 3   digoxin  (LANOXIN ) 0.125 MG tablet, Take 1 tablet (0.125 mg total) by mouth daily., Disp: 90 tablet, Rfl: 3   ferrous sulfate  325 (65 FE) MG tablet, Take 1 tablet (325 mg total) by mouth daily with breakfast., Disp: 30 tablet, Rfl: 3   furosemide  (LASIX ) 40 MG tablet, Take 1.5 tablets (60 mg total) by mouth daily., Disp: 90 tablet, Rfl: 3   JARDIANCE   10 MG TABS tablet, TAKE 1 TABLET BY MOUTH DAILY BEFORE BREAKFAST., Disp: 90 tablet, Rfl: 3   losartan  (COZAAR ) 25 MG tablet, Take 1 tablet (25 mg total) by mouth daily., Disp: 90 tablet, Rfl: 3   metoprolol  succinate (TOPROL  XL) 25 MG 24 hr tablet, Take 1 tablet (25 mg total) by mouth daily., Disp: 90 tablet, Rfl: 3   sildenafil  (VIAGRA ) 25 MG tablet, TAKE 2 TABLETS (50 MG TOTAL) BY MOUTH AS NEEDED., Disp: 10 tablet, Rfl: 0   spironolactone  (ALDACTONE ) 25 MG tablet, Take 1 tablet (25 mg total) by mouth daily., Disp: 90 tablet, Rfl: 3   docusate sodium  (COLACE) 100 MG capsule, Take 1 capsule (100 mg total) by mouth 2 (two) times daily. (Patient not taking: Reported on 12/23/2024), Disp: 10 capsule, Rfl: 0   hydrocortisone  (CORTEF ) 5 MG tablet, Take 1 tablet (5 mg total) by mouth daily. Start one daily after twice daily for 15 daily. (Patient not taking: Reported on 12/23/2024), Disp: 30 tablet, Rfl: 5   loperamide (IMODIUM) 2 MG capsule, Take 2 mg by mouth every 6 (six) hours as needed for diarrhea or loose stools. (Patient not taking: Reported on 12/23/2024), Disp: , Rfl:    magnesium  oxide (MAG-OX) 400 MG tablet, Take 1 tablet (400 mg total) by mouth daily for 14 days. (Patient not taking: Reported on 12/23/2024), Disp: 14 tablet, Rfl: 0   Multiple Vitamin (MULTIVITAMIN) tablet, Take 1 tablet by mouth daily. (Patient not taking: Reported on 12/23/2024), Disp: , Rfl:     senna (SENOKOT) 8.6 MG TABS tablet, Take 1 tablet (8.6 mg total) by mouth 2 (two) times daily. (Patient not taking: Reported on 12/23/2024), Disp: 120 tablet, Rfl: 0 [2]  Allergies Allergen Reactions   Latex Swelling and Rash

## 2024-12-24 ENCOUNTER — Telehealth: Payer: Self-pay

## 2024-12-24 NOTE — Telephone Encounter (Signed)
 SABRA

## 2024-12-24 NOTE — Telephone Encounter (Signed)
 Attempted outreach to patient to discuss wound site and assess for additional symptoms  No answer  Left message to call back (clinic number provided)

## 2024-12-24 NOTE — Telephone Encounter (Signed)
 Pt wife called in concerned about pt device site being swollen and is going to send a picture through email and would like a call back once received

## 2024-12-27 NOTE — Telephone Encounter (Signed)
 Spoke w/ patient regarding concern for swelling around the incision site. Patient states incision site is slightly swollen w/ no signs of redness, or drainage. Informed patient slight swelling around the incision site is normal directly after surgery. Patient states he is using a muscle cream not close to incision site to help w/ tenderness. Informed patient to not use ointments or cream near incision site. Verbalized understanding.   Informed to call device clinic if notes any signs of redness, increased swelling, or drainage. Verbalized understanding.   Scheduled to see Device Clinic for wound check on 01/04/2025 @ 11:20am.   Will continue to monitor and update accordingly.

## 2024-12-28 NOTE — Addendum Note (Signed)
 Encounter addended by: Debarah Garrison MATSU, RN on: 12/28/2024 12:10 PM  Actions taken: Imaging Exam ended

## 2024-12-29 ENCOUNTER — Telehealth (HOSPITAL_COMMUNITY): Payer: Self-pay | Admitting: Emergency Medicine

## 2024-12-29 ENCOUNTER — Ambulatory Visit (HOSPITAL_COMMUNITY)

## 2024-12-29 NOTE — Telephone Encounter (Signed)
 Called to do a visit this afternoon and Austin [name] states that Austin [name] would prefer to be seen tomorrow.  Set up home visit for 1/22 @ 8:00 for med rec.      Mary Sharps, EMT-Paramedic [phone] 12/29/2024

## 2024-12-30 ENCOUNTER — Other Ambulatory Visit (HOSPITAL_COMMUNITY): Payer: Self-pay | Admitting: Emergency Medicine

## 2024-12-30 ENCOUNTER — Ambulatory Visit (HOSPITAL_COMMUNITY): Payer: Self-pay | Admitting: Internal Medicine

## 2024-12-30 ENCOUNTER — Ambulatory Visit (HOSPITAL_COMMUNITY)
Admission: RE | Admit: 2024-12-30 | Discharge: 2024-12-30 | Disposition: A | Discharge status: Home / Self Care | Source: Ambulatory Visit | Attending: Cardiology | Admitting: Cardiology

## 2024-12-30 DIAGNOSIS — I5022 Chronic systolic (congestive) heart failure: Secondary | ICD-10-CM | POA: Diagnosis present

## 2024-12-30 LAB — DIGOXIN LEVEL: Digoxin Level: 1.1 ng/mL (ref 0.8–2.0)

## 2024-12-30 NOTE — Progress Notes (Signed)
 Paramedicine Encounter    Patient ID: Austin [name], male    DOB: 10-29-1956, 69 y.o.   MRN: [id number]   Complaints NONE  Assessment A&O x 4, skin W&D w/ good color.  Denies chest pain or SOB. Lung sounds equal bilat.  No peripheral edema noted  Compliance with meds Missing 5 of 7 pm doses of his Eliquis  and 1 Jardiance  from a.m.  Pill box filled x 1   Refills needed Needs multivitamins  Meds changes since last visit NONE    Social changes NONE   BP 120/80 (BP Location: Left Arm, Patient Position: Standing, Cuff Size: Normal)   Pulse 80   Resp 16   Wt 144 lb (65.3 kg)   SpO2 96%   BMI 22.55 kg/m  Weight yesterday- Last visit weight-143.6lb  ACTION: Home visit completed  Mary Claudene Kennel [phone] 12/30/24  Patient Care Team: Georgina Speaks, FNP as PCP - General (General Practice) Rolan Ezra RAMAN, MD as PCP - Advanced Heart Failure (Cardiology) Rolan Ezra RAMAN, MD as Consulting Physician (Cardiology)  Patient Active Problem List   Diagnosis Date Noted   Hypotension 11/30/2024   Syncope and collapse 11/30/2024   Hyponatremia 03/18/2024   Acute on chronic systolic CHF (congestive heart failure) (HCC) 03/11/2024   Abnormal levels of other serum enzymes 03/11/2024   Anemia 03/11/2024   Rectal bleeding 03/11/2024   ABLA (acute blood loss anemia) 01/13/2024   Adrenal insufficiency 01/12/2024   Closed left hip fracture (HCC) 01/10/2024   Transient hypotension 06/05/2023   Alcohol  use disorder 06/05/2023   Hypertensive 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 (HCC) 06/05/2023   Elevated brain natriuretic peptide (BNP) level 06/05/2023   Elevated LFTs 05/18/2023   Prolonged QT interval 05/18/2023   AKI (acute kidney injury) 05/17/2023   Iron deficiency anemia 09/03/2020   NICM (nonischemic cardiomyopathy) (HCC)    ETOH abuse 11/10/2018   Essential hypertension 11/10/2018   Blood in stool 11/10/2018    Current Medications[1] Allergies[2]   Social History   Socioeconomic History   Marital status: Married    Spouse name: Not on file   Number of children: 2   Years of education: Not on file   Highest education level: Bachelor's degree (e.g., BA, AB, BS)  Occupational History   Not on file  Tobacco Use   Smoking status: Never   Smokeless tobacco: Never  Vaping Use   Vaping status: Never Used  Substance and Sexual Activity   Alcohol  use: Not Currently    Comment: 1-3 beers daily   Drug use: No   Sexual activity: Yes  Other Topics Concern   Not on file  Social History Narrative   Not on file   Social Drivers of Health   Tobacco Use: Low Risk (12/20/2024)   Patient History    Smoking Tobacco Use: Never    Smokeless Tobacco Use: Never    Passive Exposure: Not on file  Financial Resource Strain: Low Risk (02/11/2024)   Overall Financial Resource Strain (CARDIA)    Difficulty of Paying Living Expenses: Not hard at all  Food Insecurity: No Food Insecurity (03/11/2024)   Hunger Vital Sign    Worried About Running Out of Food in the Last Year: Never true    Ran Out of Food in the Last Year: Never true  Transportation Needs: No Transportation Needs (03/11/2024)   PRAPARE - Administrator, Civil Service (Medical): No    Lack of Transportation (Non-Medical): No  Physical Activity: Insufficiently Active (02/11/2024)   Exercise Vital Sign    Days of Exercise per Week: 2 days    Minutes of Exercise per Session: 40 min  Stress: No Stress Concern Present (02/11/2024)   [employer] of Occupational Health - Occupational Stress Questionnaire    Feeling of Stress : Not at all  Social Connections: Socially Integrated (03/11/2024)   Social Connection and Isolation Panel    Frequency of Communication with Friends and Family: Never    Frequency of Social Gatherings with Friends and Family: More than three times a week    Attends Religious Services: More than 4 times per year     Active Member of Clubs or Organizations: Yes    Attends Banker Meetings: More than 4 times per year    Marital Status: Married  Catering Manager Violence: Not At Risk (03/11/2024)   Humiliation, Afraid, Rape, and Kick questionnaire    Fear of Current or Ex-Partner: No    Emotionally Abused: No    Physically Abused: No    Sexually Abused: No  Depression (PHQ2-9): Low Risk (02/11/2024)   Depression (PHQ2-9)    PHQ-2 Score: 0  Alcohol  Screen: Low Risk (02/11/2024)   Alcohol  Screen    Last Alcohol  Screening Score (AUDIT): 0  Housing: Low Risk (03/11/2024)   Housing Stability Vital Sign    Unable to Pay for Housing in the Last Year: No    Number of Times Moved in the Last Year: 0    Homeless in the Last Year: No  Utilities: Not At Risk (03/11/2024)   AHC Utilities    Threatened with loss of utilities: No  Health Literacy: Adequate Health Literacy (02/11/2024)   B1300 Health Literacy    Frequency of need for help with medical instructions: Never    Physical Exam      Future Appointments  Date Time Provider Department Center  12/30/2024  9:30 AM [facility] LAB [facility] None  01/04/2025 11:20 AM [facility] H[facility] DEVICE 1 [facility]V  01/19/2025  9:00 AM [facility] PA/NP [facility] None  02/03/2025  7:00 AM [facility] HVT DEVICE REMOTES [facility]  03/16/2025  2:40 PM [facility]-ANNUAL WELLNESS VISIT [facility] [address]  03/22/2025  1:30 PM Leverne Charlies Helling, PA-C [facility]V  05/05/2025  7:00 AM [facility] HVT DEVICE REMOTES [facility]&V  08/04/2025  7:00 AM [facility] HVT DEVICE REMOTES [facility]&V  11/03/2025  7:00 AM [facility] HVT DEVICE REMOTES [facility]&V  02/02/2026  7:00 AM [facility] HVT DEVICE REMOTES [facility]          [1]  Current Outpatient Medications:    apixaban  (ELIQUIS ) 5 MG TABS tablet, Take 1 tablet (5 mg total) by mouth 2 (two) times daily., Disp: 180 tablet, Rfl: 3   digoxin  (LANOXIN ) 0.125 MG tablet, Take 1 tablet (0.125 mg total) by mouth daily., Disp: 90 tablet, Rfl: 3   ferrous sulfate  325  (65 FE) MG tablet, Take 1 tablet (325 mg total) by mouth daily with breakfast., Disp: 30 tablet, Rfl: 3   furosemide  (LASIX ) 40 MG tablet, Take 1.5 tablets (60 mg total) by mouth daily., Disp: 90 tablet, Rfl: 3   hydrocortisone  (CORTEF ) 5 MG tablet, Take 1 tablet (5 mg total) by mouth daily. Start one daily after twice daily for 15 daily., Disp: 30 tablet, Rfl: 5   JARDIANCE  10 MG TABS tablet, TAKE 1 TABLET BY MOUTH DAILY BEFORE BREAKFAST., Disp: 90 tablet, Rfl: 3   losartan  (COZAAR ) 25 MG tablet, Take 1 tablet (25  mg total) by mouth daily., Disp: 90 tablet, Rfl: 3   metoprolol  succinate (TOPROL  XL) 25 MG 24 hr tablet, Take 1 tablet (25 mg total) by mouth daily., Disp: 90 tablet, Rfl: 3   sildenafil  (VIAGRA ) 25 MG tablet, TAKE 2 TABLETS (50 MG TOTAL) BY MOUTH AS NEEDED., Disp: 10 tablet, Rfl: 0   spironolactone  (ALDACTONE ) 25 MG tablet, Take 1 tablet (25 mg total) by mouth daily., Disp: 90 tablet, Rfl: 3   docusate sodium  (COLACE) 100 MG capsule, Take 1 capsule (100 mg total) by mouth 2 (two) times daily. (Patient not taking: Reported on 12/30/2024), Disp: 10 capsule, Rfl: 0   loperamide (IMODIUM) 2 MG capsule, Take 2 mg by mouth every 6 (six) hours as needed for diarrhea or loose stools. (Patient not taking: Reported on 12/30/2024), Disp: , Rfl:    magnesium  oxide (MAG-OX) 400 MG tablet, Take 1 tablet (400 mg total) by mouth daily for 14 days. (Patient not taking: Reported on 12/30/2024), Disp: 14 tablet, Rfl: 0   Multiple Vitamin (MULTIVITAMIN) tablet, Take 1 tablet by mouth daily. (Patient not taking: Reported on 12/30/2024), Disp: , Rfl:    senna (SENOKOT) 8.6 MG TABS tablet, Take 1 tablet (8.6 mg total) by mouth 2 (two) times daily. (Patient not taking: Reported on 12/30/2024), Disp: 120 tablet, Rfl: 0 [2]  Allergies Allergen Reactions   Latex Swelling and Rash

## 2024-12-31 ENCOUNTER — Telehealth: Payer: Self-pay

## 2024-12-31 ENCOUNTER — Telehealth (HOSPITAL_COMMUNITY): Payer: Self-pay | Admitting: *Deleted

## 2024-12-31 NOTE — Telephone Encounter (Signed)
 Remote transmission received. Presenting rhythm remains AF w/ elevated ventricular rates. Patient remains asymptomatic.   Will forward to AF clinic to contact patient for apt. Patient/wife are agreeable and was appreciative of call.

## 2024-12-31 NOTE — Telephone Encounter (Signed)
 Called DeDe (Paramedicine) with following:  Again. Needs repeat digoxin  level without taking his digoxin  that morning. Level high again today in the setting of taking right before labs. Please arrange for repeat dig level next week, once arranged let Dede know so that she can make sure its not in his pill box if possible.  Pt has lab appointment next Thursday, 01/06/25. DeDe says she will remove his digoxin  from his box for that day.

## 2024-12-31 NOTE — Telephone Encounter (Signed)
 Alert received from [employer] for VT episode in the monitor zone event occurred 1/22 @ 07:12, duration 48sec, HR 179, EGM c/w AF with irregular R-R, brief periods of regularity, likely AF with RVR.  Persistent AF, not always good rate control, Eliquis  per EPIC .  Patient called who denies any symptoms. He currently is at [employer] and will send a remote transmission when he returns home in about 1 hour. Will call pt to assist w/ sending.   Discussed referral to AF clinic. Patient is agreeable.   Will follow up with pt shortly to assist with sending an updated transmission.

## 2025-01-02 ENCOUNTER — Ambulatory Visit (HOSPITAL_COMMUNITY): Payer: Self-pay | Admitting: Cardiology

## 2025-01-04 ENCOUNTER — Encounter: Payer: Self-pay | Admitting: *Deleted

## 2025-01-04 ENCOUNTER — Ambulatory Visit: Admitting: *Deleted

## 2025-01-04 DIAGNOSIS — I5022 Chronic systolic (congestive) heart failure: Secondary | ICD-10-CM | POA: Diagnosis not present

## 2025-01-04 LAB — CUP PACEART INCLINIC DEVICE CHECK
Battery Remaining Longevity: 99 mo
Brady Statistic RA Percent Paced: 0.01 %
Brady Statistic RV Percent Paced: 17 %
Date Time Interrogation Session: 20260127170542
HighPow Impedance: 50.625
HighPow Impedance: 51 Ohm
Implantable Lead Connection Status: 753985
Implantable Lead Connection Status: 753985
Implantable Lead Connection Status: 753985
Implantable Lead Implant Date: 20260113
Implantable Lead Implant Date: 20260113
Implantable Lead Implant Date: 20260113
Implantable Lead Location: 753858
Implantable Lead Location: 753859
Implantable Lead Location: 753860
Implantable Pulse Generator Implant Date: 20260113
Lead Channel Impedance Value: 475 Ohm
Lead Channel Impedance Value: 525 Ohm
Lead Channel Impedance Value: 575 Ohm
Lead Channel Pacing Threshold Amplitude: 0.5 V
Lead Channel Pacing Threshold Amplitude: 0.5 V
Lead Channel Pacing Threshold Amplitude: 1.5 V
Lead Channel Pacing Threshold Amplitude: 1.5 V
Lead Channel Pacing Threshold Pulse Width: 0.5 ms
Lead Channel Pacing Threshold Pulse Width: 0.5 ms
Lead Channel Pacing Threshold Pulse Width: 0.5 ms
Lead Channel Pacing Threshold Pulse Width: 0.5 ms
Lead Channel Sensing Intrinsic Amplitude: 1 mV
Lead Channel Sensing Intrinsic Amplitude: 11.3 mV
Lead Channel Setting Pacing Amplitude: 3.5 V
Lead Channel Setting Pacing Amplitude: 3.5 V
Lead Channel Setting Pacing Amplitude: 3.5 V
Lead Channel Setting Pacing Pulse Width: 0.5 ms
Lead Channel Setting Pacing Pulse Width: 0.5 ms
Lead Channel Setting Sensing Sensitivity: 0.5 mV
Pulse Gen Serial Number: 211065540
Zone Setting Status: 755011

## 2025-01-04 NOTE — Patient Instructions (Signed)

## 2025-01-04 NOTE — Progress Notes (Signed)
 Normal multi chamber CRT-D wound check. Wound well healed. Presenting rhythm: Afib/VS. Routine testing performed. Thresholds, sensing, and impedance consistent with implant measurements with 3.5V safety margin/auto capture until 3 month visit. No treated arrhythmias. Reviewed arm restrictions to continue for 6 weeks total post op. Reviewed shock plan.  Pt enrolled in remote follow-up. BiVP effective 17% Patient scheduled to see AFib clinic 01/06/25. TEE with [facility] per Dr. Rolan note. Patient restarted Eliquis  12/25/24.

## 2025-01-05 ENCOUNTER — Telehealth (HOSPITAL_COMMUNITY): Payer: Self-pay | Admitting: Emergency Medicine

## 2025-01-05 ENCOUNTER — Other Ambulatory Visit (HOSPITAL_COMMUNITY): Payer: Self-pay | Admitting: Emergency Medicine

## 2025-01-05 NOTE — Progress Notes (Signed)
 "  Primary Care Physician: Georgina Speaks, FNP Primary Cardiologist: Rolan Ezra RAMAN, MD  Electrophysiologist: Inocencio Soyla Lunger, MD   Referring Physician: Inocencio Soyla Lunger, MD   Austin [name] is a 69 y.o. male with a history of paroxysmal AF, chronic systolic CHF/NICM s/p CRT-D 12/21/2024, hypotension, adrenal insufficiency, EtOH abuse who presents for follow up in the [facility] Atrial Fibrillation Clinic.  The patient was initially diagnosed with atrial fibrillation in 11/2024.  He has a history of CHF dating back to 2021 with 2D echo showing EF of less than 20% with severe RV dysfunction.  He underwent a R/LHC on 02/2020 that showed no evidence of CAD but elevated filling pressures with preserved cardiac index.  He completed a cardiac MRI that showed EF of 40% with no late gadolinium enhancement.  He had GDMT initiated and was followed by the advanced heart failure clinic with improvement to EF by echo on 04/2022 but reduction back to 25-30% on 07/2023.  He was referred to EP and evaluated by Dr. Fernande 12/22/2023 who felt that CRT-D was not beneficial at that time.  He was seen in follow-up by Dr. Rolan on 10/2024 and underwent repeat 2D echo on 10/29/2024 that showed EF still reduced.  He was referred to EP and evaluated by Dr. Inocencio on 11/03/2024 and was felt to be a good candidate for CRT-D therapy.  He experienced a syncopal episode while at [facility] on 11/30/2024 and was noted to be in atrial fibrillation.  He was started on Eliquis  5 mg twice daily with plan to place a ZIO monitor prior to discharge.  He also reported recently recovering from a GI illness and was not consistently taking his medications.  Unfortunately left AMA on 12/01/2024.  He was seen back in the advanced heart failure clinic on 12/20/2024 and underwent CRT-D placement by Dr. Inocencio on 12/21/2024.  He was contacted by the device clinic on 12/31/2024 due to alerts showing VT episode with AF with RVR irregularities.  He  denied any symptoms and was advised to follow-up with the AF clinic for further evaluation.  [name] and his wife present today for follow-up in the A-fib clinic.  On examination patient is in atrial fibrillation with a controlled heart rate.  He is also reporting no symptoms associated with his heart rhythm.  He appears euvolemic on examination and blood pressure is soft but stable at 92/62. He has been experiencing persistent atrial fibrillation and remains in this arrhythmia.He reports being compliant with his Eliquis  since his CRT-D implant.  During today's visit we discussed the pathophysiology of atrial fibrillation and also reviewed some of the treatments for managing heart rhythm such as medications, cardioversion, and possible ablation procedure.  He recently wore a 14-day ZIO that showed persistent atrial fibrillation and will require TEE/DCCV due to decreased LV function He has concerns about his ability to work and travel due to his condition. He works at the [employer] and has considered canceling a planned trip to the [place].  He has a supportive network, including his church community and family, who are involved in his care and provide emotional support.   Today, he denies symptoms of palpitations, chest pain, shortness of breath, orthopnea, PND, lower extremity edema, dizziness, presyncope, syncope, snoring, daytime somnolence, bleeding, or neurologic sequela. The patient is tolerating medications without difficulties and is otherwise without complaint today.   Discussed the use of AI scribe software for clinical note transcription with the patient, who gave verbal consent  to proceed.   Atrial Fibrillation Management history: History of Sleep Apnea History of alcohol  use daily Previous antiarrhythmic drugs: None Previous cardioversions: None Previous ablations: None Anticoagulation history: Eliquis   ROS- All systems are reviewed and negative except as per the HPI above.  Past  Medical History:  Diagnosis Date   Arthritis    hands   CHF (congestive heart failure) (HCC)    GERD (gastroesophageal reflux disease)    Hypertension    Hypomagnesemia    Hyponatremia    Prostate cancer [facility])    Past Surgical History:  Procedure Laterality Date   BIV ICD INSERTION CRT-D N/A 12/21/2024   Procedure: BIV ICD INSERTION CRT-D;  Surgeon: Inocencio Soyla Lunger, MD;  Location: [facility] INVASIVE CV LAB;  Service: Cardiovascular;  Laterality: N/A;   LEAD INSERTION N/A 12/21/2024   Procedure: LEAD INSERTION;  Surgeon: Inocencio Soyla Lunger, MD;  Location: [facility] INVASIVE CV LAB;  Service: Cardiovascular;  Laterality: N/A;   LYMPHADENECTOMY Bilateral 01/26/2014   Procedure: LYMPHADENECTOMY WITH INDOCYANINE GREEN  DYE;  Surgeon: Ricardo Likens, MD;  Location: [facility] ORS;  Service: Urology;  Laterality: Bilateral;   RIGHT/LEFT HEART CATH AND CORONARY ANGIOGRAPHY N/A 02/25/2020   Procedure: RIGHT/LEFT HEART CATH AND CORONARY ANGIOGRAPHY;  Surgeon: Verlin Lonni BIRCH, MD;  Location: [facility] INVASIVE CV LAB;  Service: Cardiovascular;  Laterality: N/A;   ROBOT ASSISTED LAPAROSCOPIC RADICAL PROSTATECTOMY N/A 01/26/2014   Procedure: ROBOTIC ASSISTED LAPAROSCOPIC RADICAL PROSTATECTOMY;  Surgeon: Ricardo Likens, MD;  Location: [facility] ORS;  Service: Urology;  Laterality: N/A;   SHOULDER SURGERY Left    TEE WITHOUT CARDIOVERSION N/A 04/15/2022   Procedure: TRANSESOPHAGEAL ECHOCARDIOGRAM (TEE);  Surgeon: Rolan Ezra RAMAN, MD;  Location: [facility] ENDOSCOPY;  Service: Cardiovascular;  Laterality: N/A;   TOTAL HIP ARTHROPLASTY Left 01/11/2024   Procedure: TOTAL HIP ARTHROPLASTY ANTERIOR APPROACH;  Surgeon: Fidel Rogue, MD;  Location: [facility] ORS;  Service: Orthopedics;  Laterality: Left;   Latex Current Outpatient Medications  Medication Sig Dispense Refill   apixaban  (ELIQUIS ) 5 MG TABS tablet Take 1 tablet (5 mg total) by mouth 2 (two) times daily. 180 tablet 3   digoxin  (LANOXIN ) 0.125 MG tablet Take 1 tablet (0.125 mg total)  by mouth daily. 90 tablet 3   ferrous sulfate  325 (65 FE) MG tablet Take 1 tablet (325 mg total) by mouth daily with breakfast. 30 tablet 3   furosemide  (LASIX ) 40 MG tablet Take 1.5 tablets (60 mg total) by mouth daily. 90 tablet 3   hydrocortisone  (CORTEF ) 5 MG tablet Take 1 tablet (5 mg total) by mouth daily. Start one daily after twice daily for 15 daily. 30 tablet 5   JARDIANCE  10 MG TABS tablet TAKE 1 TABLET BY MOUTH DAILY BEFORE BREAKFAST. 90 tablet 3   losartan  (COZAAR ) 25 MG tablet Take 1 tablet (25 mg total) by mouth daily. 90 tablet 3   metoprolol  succinate (TOPROL  XL) 25 MG 24 hr tablet Take 1 tablet (25 mg total) by mouth daily. 90 tablet 3   sildenafil  (VIAGRA ) 25 MG tablet TAKE 2 TABLETS (50 MG TOTAL) BY MOUTH AS NEEDED. 10 tablet 0   spironolactone  (ALDACTONE ) 25 MG tablet Take 1 tablet (25 mg total) by mouth daily. 90 tablet 3   No current facility-administered medications for this encounter.    Physical Exam: BP 92/62   Pulse 88   Ht 5' 7 (1.702 m)   Wt 67.2 kg   BMI 23.21 kg/m   GEN: Well nourished, well developed in no acute distress NECK: No JVD; No  carotid bruits CARDIAC: Irregularly irregular rate and rhythm, no murmurs, rubs, gallops RESPIRATORY:  Clear to auscultation without rales, wheezing or rhonchi  ABDOMEN: Soft, non-tender, non-distended EXTREMITIES:  No edema; No deformity   Wt Readings from Last 3 Encounters:  01/06/25 67.2 kg  01/05/25 64.3 kg  12/30/24 65.3 kg    Lab Results  Component Value Date   TSH 0.625 11/30/2024   EKG today demonstrates:   EKG Interpretation Date/Time:  Thursday January 06 2025 11:46:37 EST Ventricular Rate:  88 PR Interval:    QRS Duration:  156 QT Interval:  416 QTC Calculation: 503 R Axis:   212  Text Interpretation: Atrial fibrillation Right superior axis deviation Non-specific intra-ventricular conduction block Minimal voltage criteria for LVH, may be normal variant ( Cornell product ) Abnormal ECG When  compared with ECG of 21-Dec-2024 18:39, Current undetermined rhythm precludes rhythm comparison, needs review Confirmed by Wyn Manus [phone]) on 01/06/2025 11:50:11 AM        Echo Completed 10/29/2024: 1. Left ventricular ejection fraction, by estimation, is 20 to 25%. The  left ventricle has severely decreased function. The left ventricle  demonstrates global hypokinesis. The left ventricular internal cavity size  was mildly dilated. There is mild left  ventricular hypertrophy of the septal segment. Left ventricular diastolic  parameters are indeterminate.   2. Right ventricular systolic function is severely reduced. The right  ventricular size is normal. There is severely elevated pulmonary artery  systolic pressure.   3. Left atrial size was severely dilated.   4. Right atrial size was severely dilated.   5. The mitral valve is normal in structure. Mild mitral valve  regurgitation. No evidence of mitral stenosis.   6. Tricuspid valve regurgitation is mild to moderate.   7. The aortic valve is tricuspid. Aortic valve regurgitation is not  visualized. No aortic stenosis is present.   8. The inferior vena cava is dilated in size with <50% respiratory  variability, suggesting right atrial pressure of 15 mmHg.    CHA2DS2-VASc Score = 3  The patient's score is based upon: CHF History: 1 HTN History: 1 Diabetes History: 0 Stroke History: 0 Vascular Disease History: 0 Age Score: 1 Gender Score: 0      ASSESSMENT AND PLAN: Persistent Atrial Fibrillation (ICD10:  I48.19) The patient's CHA2DS2-VASc score is 3, indicating a 3.2% annual risk of stroke.   - recent ZIO monitor with persistent A-fib noted over 14 days requiring intervention for conversion to sinus rhythm due to decreased LV function. - Scheduled TEE and cardioversion for February 4th, 2026. - Instructed to hold Jardiance  for three days prior to the procedure. - Continue Eliquis  5 mg twice daily - CBC, BMET today  -  Discussed further treatment such as possible ablation and medications but may be limited due to history of noncompliance if unable to maintain sinus rhythm post cardioversion  Secondary Hypercoagulable State (ICD10:  D68.69) The patient is at significant risk for stroke/thromboembolism based upon his CHA2DS2-VASc Score of 3.  Continue Apixaban  (Eliquis ).   HFrEF/NICM: -s/p CRT-D implant by Dr. Inocencio on 12/21/2024 with ZIO monitor showing persistent atrial fibrillation. - 2D echo recently showed estimated EF of 20-25% and currently managed by advanced heart failure clinic - Continue current medications as prescribed  EtOH abuse: - Reports ongoing EtOH use and encouraged to reduce to better control AF burden.  HTN: BP controlled. Continue current antihypertensive regimen.   Signed,  Wyn Raddle, Manus Shove, NP    01/06/2025 1:27 PM  Informed Consent   Shared Decision Making/Informed Consent   The risks [stroke, cardiac arrhythmias rarely resulting in the need for a temporary or permanent pacemaker, skin irritation or burns, esophageal damage, perforation (1:10,000 risk), bleeding, pharyngeal hematoma as well as other potential complications associated with conscious sedation including aspiration, arrhythmia, respiratory failure and death], benefits (treatment guidance, restoration of normal sinus rhythm, diagnostic support) and alternatives of a transesophageal echocardiogram guided cardioversion were discussed in detail with Mr. Breithaupt and he is willing to proceed.     Follow up with the AF Clinic in 2 weeks post conversion     "

## 2025-01-05 NOTE — Progress Notes (Signed)
 Paramedicine Encounter    Patient ID: Austin [name], male    DOB: Jun 01, 1956, 69 y.o.   MRN: [id number]   Complaints NONE  Assessment A&O x 4, skin W&D w/ good color. Pt w/ odor of ETOH on his breath and an almost empty glass of beer on his coffee table.   Denies chest pain or SOB. Lung sounds clear and equal bilat. No peripheral edema noted.  Pt's weight down 3lbs.  Med box reconciled x 1 week.  Tomorrow's dose set up w/o Digoxin .    Compliance with meds Still missing evening doses of Eliquis  about half the time.  Pill box filled x 1 week  Refills needed NONE  Meds changes since last visit NONE    Social changes NONE   BP 90/60 (BP Location: Left Arm, Patient Position: Sitting, Cuff Size: Normal)   Pulse 72   Resp 14   Wt 141 lb 12.8 oz (64.3 kg)   SpO2 96%   BMI 22.21 kg/m  Weight yesterday-   Last visit weight-144lb  ACTION: Home visit completed  Mary Claudene Kennel [phone] 01/05/25  Patient Care Team: Georgina Speaks, FNP as PCP - General (General Practice) Rolan Ezra RAMAN, MD as PCP - Advanced Heart Failure (Cardiology) Rolan Ezra RAMAN, MD as Consulting Physician (Cardiology)  Patient Active Problem List   Diagnosis Date Noted   Hypotension 11/30/2024   Syncope and collapse 11/30/2024   Hyponatremia 03/18/2024   Acute on chronic systolic CHF (congestive heart failure) (HCC) 03/11/2024   Abnormal levels of other serum enzymes 03/11/2024   Anemia 03/11/2024   Rectal bleeding 03/11/2024   ABLA (acute blood loss anemia) 01/13/2024   Adrenal insufficiency 01/12/2024   Closed left hip fracture (HCC) 01/10/2024   Transient hypotension 06/05/2023   Alcohol  use disorder 06/05/2023   Hypertensive 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 (HCC) 06/05/2023   Elevated brain natriuretic peptide (BNP) level 06/05/2023   Elevated LFTs 05/18/2023   Prolonged QT interval 05/18/2023   AKI (acute kidney injury)  05/17/2023   Iron deficiency anemia 09/03/2020   NICM (nonischemic cardiomyopathy) (HCC)    ETOH abuse 11/10/2018   Essential hypertension 11/10/2018   Blood in stool 11/10/2018   Current Medications[1] Allergies[2]   Social History   Socioeconomic History   Marital status: Married    Spouse name: Not on file   Number of children: 2   Years of education: Not on file   Highest education level: Bachelor's degree (e.g., BA, AB, BS)  Occupational History   Not on file  Tobacco Use   Smoking status: Never   Smokeless tobacco: Never  Vaping Use   Vaping status: Never Used  Substance and Sexual Activity   Alcohol  use: Not Currently    Comment: 1-3 beers daily   Drug use: No   Sexual activity: Yes  Other Topics Concern   Not on file  Social History Narrative   Not on file   Social Drivers of Health   Tobacco Use: Low Risk (12/20/2024)   Patient History    Smoking Tobacco Use: Never    Smokeless Tobacco Use: Never    Passive Exposure: Not on file  Financial Resource Strain: Low Risk (02/11/2024)   Overall Financial Resource Strain (CARDIA)    Difficulty of Paying Living Expenses: Not hard at all  Food Insecurity: No Food Insecurity (03/11/2024)   Hunger Vital Sign    Worried About Running Out of Food in the Last Year: Never true  Ran Out of Food in the Last Year: Never true  Transportation Needs: No Transportation Needs (03/11/2024)   PRAPARE - Administrator, Civil Service (Medical): No    Lack of Transportation (Non-Medical): No  Physical Activity: Insufficiently Active (02/11/2024)   Exercise Vital Sign    Days of Exercise per Week: 2 days    Minutes of Exercise per Session: 40 min  Stress: No Stress Concern Present (02/11/2024)   [employer] of Occupational Health - Occupational Stress Questionnaire    Feeling of Stress : Not at all  Social Connections: Socially Integrated (03/11/2024)   Social Connection and Isolation Panel    Frequency of  Communication with Friends and Family: Never    Frequency of Social Gatherings with Friends and Family: More than three times a week    Attends Religious Services: More than 4 times per year    Active Member of Clubs or Organizations: Yes    Attends Banker Meetings: More than 4 times per year    Marital Status: Married  Catering Manager Violence: Not At Risk (03/11/2024)   Humiliation, Afraid, Rape, and Kick questionnaire    Fear of Current or Ex-Partner: No    Emotionally Abused: No    Physically Abused: No    Sexually Abused: No  Depression (PHQ2-9): Low Risk (02/11/2024)   Depression (PHQ2-9)    PHQ-2 Score: 0  Alcohol  Screen: Low Risk (02/11/2024)   Alcohol  Screen    Last Alcohol  Screening Score (AUDIT): 0  Housing: Low Risk (03/11/2024)   Housing Stability Vital Sign    Unable to Pay for Housing in the Last Year: No    Number of Times Moved in the Last Year: 0    Homeless in the Last Year: No  Utilities: Not At Risk (03/11/2024)   AHC Utilities    Threatened with loss of utilities: No  Health Literacy: Adequate Health Literacy (02/11/2024)   B1300 Health Literacy    Frequency of need for help with medical instructions: Never    Physical Exam      Future Appointments  Date Time Provider Department Center  01/06/2025 11:30 AM Wyn Jackee VEAR Mickey., NP [facility] H&V  01/06/2025 12:30 PM [facility] LAB [facility] None  01/19/2025  9:00 AM [facility] PA/NP [facility] None  02/03/2025  7:00 AM [facility] HVT DEVICE REMOTES [facility] H&V  03/16/2025  2:40 PM [facility]-ANNUAL WELLNESS VISIT [facility] [id number]  03/22/2025  1:30 PM Leverne Charlies Helling, PA-C [facility] H&V  05/05/2025  7:00 AM CVD HVT DEVICE REMOTES [facility] H&V  08/04/2025  7:00 AM CVD HVT DEVICE REMOTES [facility] H&V  11/03/2025  7:00 AM CVD HVT DEVICE REMOTES [facility] H&V  02/02/2026  7:00 AM CVD HVT DEVICE REMOTES [facility] H&V          [1]  Current Outpatient Medications:    apixaban  (ELIQUIS ) 5 MG TABS tablet, Take 1 tablet (5  mg total) by mouth 2 (two) times daily., Disp: 180 tablet, Rfl: 3   digoxin  (LANOXIN ) 0.125 MG tablet, Take 1 tablet (0.125 mg total) by mouth daily., Disp: 90 tablet, Rfl: 3   ferrous sulfate  325 (65 FE) MG tablet, Take 1 tablet (325 mg total) by mouth daily with breakfast., Disp: 30 tablet, Rfl: 3   furosemide  (LASIX ) 40 MG tablet, Take 1.5 tablets (60 mg total) by mouth daily., Disp: 90 tablet, Rfl: 3   hydrocortisone  (CORTEF ) 5 MG tablet, Take 1 tablet (5 mg total) by mouth daily. Start one daily after twice daily  for 15 daily., Disp: 30 tablet, Rfl: 5   JARDIANCE  10 MG TABS tablet, TAKE 1 TABLET BY MOUTH DAILY BEFORE BREAKFAST., Disp: 90 tablet, Rfl: 3   losartan  (COZAAR ) 25 MG tablet, Take 1 tablet (25 mg total) by mouth daily., Disp: 90 tablet, Rfl: 3   metoprolol  succinate (TOPROL  XL) 25 MG 24 hr tablet, Take 1 tablet (25 mg total) by mouth daily., Disp: 90 tablet, Rfl: 3   sildenafil  (VIAGRA ) 25 MG tablet, TAKE 2 TABLETS (50 MG TOTAL) BY MOUTH AS NEEDED., Disp: 10 tablet, Rfl: 0   spironolactone  (ALDACTONE ) 25 MG tablet, Take 1 tablet (25 mg total) by mouth daily., Disp: 90 tablet, Rfl: 3   docusate sodium  (COLACE) 100 MG capsule, Take 1 capsule (100 mg total) by mouth 2 (two) times daily. (Patient not taking: Reported on 01/05/2025), Disp: 10 capsule, Rfl: 0   loperamide (IMODIUM) 2 MG capsule, Take 2 mg by mouth every 6 (six) hours as needed for diarrhea or loose stools. (Patient not taking: Reported on 01/05/2025), Disp: , Rfl:    magnesium  oxide (MAG-OX) 400 MG tablet, Take 1 tablet (400 mg total) by mouth daily for 14 days. (Patient not taking: Reported on 01/05/2025), Disp: 14 tablet, Rfl: 0   Multiple Vitamin (MULTIVITAMIN) tablet, Take 1 tablet by mouth daily. (Patient not taking: Reported on 01/05/2025), Disp: , Rfl:    senna (SENOKOT) 8.6 MG TABS tablet, Take 1 tablet (8.6 mg total) by mouth 2 (two) times daily. (Patient not taking: Reported on 01/05/2025), Disp: 120 tablet, Rfl: 0 [2]   Allergies Allergen Reactions   Latex Swelling and Rash

## 2025-01-05 NOTE — Telephone Encounter (Signed)
 Digoxin  has been omitted from 1/29 med regimen for his labwork as requested by H&V.    Mary Sharps, EMT-Paramedic [phone] 01/05/2025

## 2025-01-06 ENCOUNTER — Telehealth (HOSPITAL_COMMUNITY): Payer: Self-pay

## 2025-01-06 ENCOUNTER — Encounter (HOSPITAL_COMMUNITY): Payer: Self-pay | Admitting: Nurse Practitioner

## 2025-01-06 ENCOUNTER — Ambulatory Visit (HOSPITAL_COMMUNITY)

## 2025-01-06 ENCOUNTER — Ambulatory Visit: Payer: Self-pay | Admitting: Cardiology

## 2025-01-06 ENCOUNTER — Ambulatory Visit (HOSPITAL_COMMUNITY)
Admission: RE | Admit: 2025-01-06 | Discharge: 2025-01-06 | Disposition: A | Discharge status: Home / Self Care | Source: Ambulatory Visit | Attending: Nurse Practitioner | Admitting: Nurse Practitioner

## 2025-01-06 VITALS — BP 92/62 | HR 88 | Ht 67.0 in | Wt 148.2 lb

## 2025-01-06 DIAGNOSIS — I5020 Unspecified systolic (congestive) heart failure: Secondary | ICD-10-CM | POA: Diagnosis not present

## 2025-01-06 DIAGNOSIS — F1010 Alcohol abuse, uncomplicated: Secondary | ICD-10-CM

## 2025-01-06 DIAGNOSIS — I5022 Chronic systolic (congestive) heart failure: Secondary | ICD-10-CM

## 2025-01-06 DIAGNOSIS — I10 Essential (primary) hypertension: Secondary | ICD-10-CM | POA: Diagnosis not present

## 2025-01-06 DIAGNOSIS — I4819 Other persistent atrial fibrillation: Secondary | ICD-10-CM

## 2025-01-06 DIAGNOSIS — I428 Other cardiomyopathies: Secondary | ICD-10-CM | POA: Diagnosis not present

## 2025-01-06 DIAGNOSIS — D6859 Other primary thrombophilia: Secondary | ICD-10-CM

## 2025-01-06 NOTE — H&P (View-Only) (Signed)
 Jackee,  Have them put the TEE/DCCV on for me to do on 2/4.  I want to see what his LV looks like. Thanks.

## 2025-01-06 NOTE — Telephone Encounter (Signed)
Released order for [facility] °

## 2025-01-06 NOTE — Patient Instructions (Addendum)
 Hold Jardiance  starting 01/08/25 Saturday    Cardioversion scheduled for: 01/12/25 Wednesday at 12:00pm   - Arrive at the Main Entrance A of [facility] ([address]  and check in with ADMITTING at 12:00 pm    - Do not eat or drink anything after midnight the night prior to your procedure.   - Take all your morning medication (except diabetic medications) with a sip of water  prior to arrival.  - Do NOT miss any doses of your blood thinner - if you should miss a dose or take a dose more than 4 hours late -- please notify our office immediately.  - You will not be able to drive home after your procedure. Please ensure you have a responsible adult to drive you home. You will need someone with you for 24 hours post procedure.     - Expect to be in the procedural area approximately 2 hours.   - If you feel as if you go back into normal rhythm prior to scheduled cardioversion, please notify our office immediately.   If your procedure is canceled in the cardioversion suite you will be charged a cancellation fee.      Hold below medications 72 hours prior to scheduled procedure/anesthesia. Restart medication on the following day after scheduled procedure/anesthesia  Empagliflozin  (Jardiance )      For those patients who have a scheduled procedure/anesthesia on the same day of the week as their dose, hold the medication on the day of surgery.  They can take their scheduled dose the week before.  **Patients on the above medications scheduled for elective procedures that have not held the medication for the appropriate amount of time are at risk of cancellation or change in the anesthetic plan.

## 2025-01-06 NOTE — Progress Notes (Signed)
 Jackee,  Have them put the TEE/DCCV on for me to do on 2/4.  I want to see what his LV looks like. Thanks.

## 2025-01-06 NOTE — Addendum Note (Signed)
 Encounter addended by: Janel Nancy SAUNDERS, RN on: 01/06/2025 1:40 PM  Actions taken: Order list changed, Diagnosis association updated

## 2025-01-06 NOTE — Addendum Note (Signed)
 Encounter addended by: Janel Nancy SAUNDERS, RN on: 01/06/2025 2:36 PM  Actions taken: Order list changed

## 2025-01-06 NOTE — Addendum Note (Signed)
 Encounter addended by: Janel Nancy SAUNDERS, RN on: 01/06/2025 3:26 PM  Actions taken: Order list changed

## 2025-01-07 ENCOUNTER — Telehealth (HOSPITAL_COMMUNITY): Payer: Self-pay

## 2025-01-07 ENCOUNTER — Ambulatory Visit (HOSPITAL_COMMUNITY): Payer: Self-pay | Admitting: Nurse Practitioner

## 2025-01-07 LAB — BASIC METABOLIC PANEL WITH GFR
BUN/Creatinine Ratio: 17 (ref 10–24)
BUN: 27 mg/dL (ref 8–27)
CO2: 19 mmol/L — ABNORMAL LOW (ref 20–29)
Calcium: 8.7 mg/dL (ref 8.6–10.2)
Chloride: 92 mmol/L — ABNORMAL LOW (ref 96–106)
Creatinine, Ser: 1.55 mg/dL — ABNORMAL HIGH (ref 0.76–1.27)
Glucose: 66 mg/dL — ABNORMAL LOW (ref 70–99)
Potassium: 3.9 mmol/L (ref 3.5–5.2)
Sodium: 129 mmol/L — ABNORMAL LOW (ref 134–144)
eGFR: 48 mL/min/{1.73_m2} — ABNORMAL LOW

## 2025-01-07 LAB — CBC
Hematocrit: 30.8 % — ABNORMAL LOW (ref 37.5–51.0)
Hemoglobin: 10.4 g/dL — ABNORMAL LOW (ref 13.0–17.7)
MCH: 33 pg (ref 26.6–33.0)
MCHC: 33.8 g/dL (ref 31.5–35.7)
MCV: 98 fL — ABNORMAL HIGH (ref 79–97)
Platelets: 174 10*3/uL (ref 150–450)
RBC: 3.15 x10E6/uL — ABNORMAL LOW (ref 4.14–5.80)
RDW: 14.5 % (ref 11.6–15.4)
WBC: 6.8 10*3/uL (ref 3.4–10.8)

## 2025-01-07 NOTE — Telephone Encounter (Addendum)
 We (dede and I)  seen the notes ref the lab results and the recommendation of him going to the ER for further eval.  We finally got him on the phone on a 3 way conversation and relayed this info to him.  He said he was eating a piece of fish and we finally got him talked into going to ER. So he said he was going to go now.   There was also instructions from HF clinic to hold his lasix  for 1 day and then cut back to 40mg  daily.  This was done over the phone with wife.   Izetta Quivers, EMT-Paramedic  [phone] 01/07/2025

## 2025-01-10 ENCOUNTER — Ambulatory Visit (HOSPITAL_COMMUNITY)

## 2025-01-11 ENCOUNTER — Other Ambulatory Visit (HOSPITAL_COMMUNITY): Payer: Self-pay | Admitting: Emergency Medicine

## 2025-01-11 NOTE — Progress Notes (Signed)
 Med box reconciled x 1 week.  No Jardiance  until 2/5. No Lasix  2/5.   Reviewed procedure instruction with Mr and Mrs Bushart.   Will follow up post procedure 01/13/25    Mary Sharps, EMT-Paramedic [phone] 01/11/2025

## 2025-01-12 ENCOUNTER — Encounter (HOSPITAL_COMMUNITY): Admitting: Certified Registered Nurse Anesthetist

## 2025-01-12 ENCOUNTER — Encounter (HOSPITAL_COMMUNITY): Payer: Self-pay | Admitting: Cardiology

## 2025-01-12 ENCOUNTER — Ambulatory Visit (HOSPITAL_COMMUNITY)

## 2025-01-12 ENCOUNTER — Ambulatory Visit (HOSPITAL_COMMUNITY)
Admission: RE | Admit: 2025-01-12 | Discharge: 2025-01-12 | Disposition: A | Discharge status: Home / Self Care | Attending: Cardiology | Admitting: Cardiology

## 2025-01-12 ENCOUNTER — Encounter (HOSPITAL_COMMUNITY)
Admission: RE | Disposition: A | Discharge status: Home / Self Care | Payer: Self-pay | Source: Home / Self Care | Attending: Cardiology

## 2025-01-12 ENCOUNTER — Other Ambulatory Visit: Payer: Self-pay

## 2025-01-12 DIAGNOSIS — I4819 Other persistent atrial fibrillation: Secondary | ICD-10-CM

## 2025-01-12 MED ORDER — PHENYLEPHRINE 80 MCG/ML (10ML) SYRINGE FOR IV PUSH (FOR BLOOD PRESSURE SUPPORT)
PREFILLED_SYRINGE | INTRAVENOUS | Status: DC | PRN
  Administered 2025-01-12: 120 ug via INTRAVENOUS

## 2025-01-12 MED ORDER — SODIUM CHLORIDE 0.9 % IV SOLN: INTRAVENOUS | Status: DC

## 2025-01-12 MED ORDER — PROPOFOL 500 MG/50ML IV EMUL
INTRAVENOUS | Status: DC | PRN
  Administered 2025-01-12: 180 ug/kg/min via INTRAVENOUS

## 2025-01-12 MED ORDER — PROPOFOL 10 MG/ML IV BOLUS
INTRAVENOUS | Status: DC | PRN
  Administered 2025-01-12: 20 mg via INTRAVENOUS

## 2025-01-12 MED ORDER — LIDOCAINE 2% (20 MG/ML) 5 ML SYRINGE
INTRAMUSCULAR | Status: DC | PRN
  Administered 2025-01-12: 60 mg via INTRAVENOUS

## 2025-01-12 NOTE — Interval H&P Note (Signed)
 History and Physical Interval Note:  01/12/2025 8:08 AM  Austin [name]  has presented today for surgery, with the diagnosis of AFIB.  The various methods of treatment have been discussed with the patient and family. After consideration of risks, benefits and other options for treatment, the patient has consented to  Procedures: TRANSESOPHAGEAL ECHOCARDIOGRAM (N/A) CARDIOVERSION (N/A) as a surgical intervention.  The patient's history has been reviewed, patient examined, no change in status, stable for surgery.  I have reviewed the patient's chart and labs.  Questions were answered to the patient's satisfaction.     Linda Grimmer [employer]

## 2025-01-12 NOTE — Anesthesia Postprocedure Evaluation (Signed)
"   Anesthesia Post Note  Patient: Austin [name]  Procedure(s) Performed: TRANSESOPHAGEAL ECHOCARDIOGRAM CARDIOVERSION     Patient location during evaluation: PACU Anesthesia Type: MAC Level of consciousness: awake and alert Pain management: pain level controlled Vital Signs Assessment: post-procedure vital signs reviewed and stable Respiratory status: spontaneous breathing, nonlabored ventilation, respiratory function stable and patient connected to nasal cannula oxygen Cardiovascular status: stable and blood pressure returned to baseline Postop Assessment: no apparent nausea or vomiting Anesthetic complications: no   No notable events documented.  Last Vitals:  Vitals:   01/12/25 0721  BP: 116/85  Pulse: 75  Resp: 15  Temp: 36.5 C  SpO2: 100%    Last Pain:  Vitals:   01/12/25 0721  TempSrc: Tympanic  PainSc: 0-No pain                 Avyukt Cimo      "

## 2025-01-12 NOTE — Procedures (Signed)
 Electrical Cardioversion Procedure Note Austin [name] [id number] 1956/05/04  Procedure: Electrical Cardioversion Indications:  Atrial Fibrillation  Procedure Details Consent: Risks of procedure as well as the alternatives and risks of each were explained to the (patient/caregiver).  Consent for procedure obtained. Time Out: Verified patient identification, verified procedure, site/side was marked, verified correct patient position, special equipment/implants available, medications/allergies/relevent history reviewed, required imaging and test results available.  Performed  Patient placed on cardiac monitor, pulse oximetry, supplemental oxygen as necessary.  Sedation given: Propofol  per anesthesiology Pacer pads placed anterior and posterior chest.  Cardioverted 1 time(s).  Cardioverted at 360J.  Evaluation Findings: Post procedure EKG shows: NSR Complications: None Patient did tolerate procedure well.   Austin [name] 01/12/2025, 8:40 AM

## 2025-01-12 NOTE — CV Procedure (Signed)
 Procedure: TEE  Indication: Atrial fibrillation  Sedation: Per anesthesiology  Findings: Please see echo section for full report.  Mildly dilated LV with mild LV hypertrophy.  Global hypokinesis, EF 20-25%.  Mildly dilated right ventricle with severe systolic dysfunction.  Moderate left atrial enlargement, no LA appendage thrombus.  Moderate right atrial enlargement.  No PFO/ASD by color doppler.  Moderate TR, peak RV-RA gradient 42 mmHg. Mild mitral regurgitation.  Trileaflet aortic valve with no stenosis or regurgitation.  Normal caliber thoracic aorta with minimal plaque.   May proceed with DCCV.  Austin [name] 01/12/2025 8:40 AM

## 2025-01-12 NOTE — Transfer of Care (Signed)
 Immediate Anesthesia Transfer of Care Note  Patient: Austin [name]  Procedure(s) Performed: TRANSESOPHAGEAL ECHOCARDIOGRAM CARDIOVERSION  Patient Location: PACU  Anesthesia Type:MAC  Level of Consciousness: drowsy  Airway & Oxygen Therapy: Patient Spontanous Breathing and Patient connected to nasal cannula oxygen  Post-op Assessment: Report given to RN  Post vital signs: Reviewed and stable  Last Vitals:  Vitals Value Taken Time  BP 94/69 01/12/25 08:45  Temp    Pulse 77 01/12/25 08:47  Resp 19 01/12/25 08:47  SpO2 95 % 01/12/25 08:47  Vitals shown include unfiled device data.  Last Pain:  Vitals:   01/12/25 0721  TempSrc: Tympanic  PainSc: 0-No pain         Complications: No notable events documented.

## 2025-01-12 NOTE — Discharge Instructions (Signed)
 Electrical Cardioversion Electrical cardioversion is the delivery of a jolt of electricity to restore a normal rhythm to the heart. A rhythm that is too fast or is not regular keeps the heart from pumping well. In this procedure, sticky patches or metal paddles are placed on the chest to deliver electricity to the heart from a device. This procedure may be done in an emergency if: There is low or no blood pressure as a result of the heart rhythm. Normal rhythm must be restored as fast as possible to protect the brain and heart from further damage. It may save a life. This may also be a scheduled procedure for irregular or fast heart rhythms that are not immediately life-threatening.  What can I expect after the procedure? Your blood pressure, heart rate, breathing rate, and blood oxygen level will be monitored until you leave the hospital or clinic. Your heart rhythm will be watched to make sure it does not change. You may have some redness on the skin where the shocks were given. Over the counter cortizone cream may be helpful.  Follow these instructions at home: Do not drive for 24 hours if you were given a sedative during your procedure. Take over-the-counter and prescription medicines only as told by your health care provider. Ask your health care provider how to check your pulse. Check it often. Rest for 48 hours after the procedure or as told by your health care provider. Avoid or limit your caffeine use as told by your health care provider. Keep all follow-up visits as told by your health care provider. This is important. Contact a health care provider if: You feel like your heart is beating too quickly or your pulse is not regular. You have a serious muscle cramp that does not go away. Get help right away if: You have discomfort in your chest. You are dizzy or you feel faint. You have trouble breathing or you are short of breath. Your speech is slurred. You have trouble moving an  arm or leg on one side of your body. Your fingers or toes turn cold or blue. Summary Electrical cardioversion is the delivery of a jolt of electricity to restore a normal rhythm to the heart. This procedure may be done right away in an emergency or may be a scheduled procedure if the condition is not an emergency. Generally, this is a safe procedure. After the procedure, check your pulse often as told by your health care provider. This information is not intended to replace advice given to you by your health care provider. Make sure you discuss any questions you have with your health care provider. Document Revised: 06/28/2019 Document Reviewed: 06/28/2019 Elsevier Patient EducatiElectrical Cardioversion Electrical cardioversion is the delivery of a jolt of electricity to restore a normal rhythm to the heart. A rhythm that is too fast or is not regular (arrhythmia) keeps the heart from pumping blood well. There is also another type of cardioversion called a chemical (pharmacologic) cardioversion. This is when your health care provider gives you one or more medicines to bring back your regular heart rhythm. Electrical cardioversion is done as a scheduled procedure for arrhythmiasthat are not life-threatening. Electrical cardioversion may also be done in an emergency for sudden life-threatening arrhythmias. Tell a health care provider about: Any allergies you have. All medicines you are taking, including vitamins, herbs, eye drops, creams, and over-the-counter medicines. Any problems you or family members have had with sedatives or anesthesia. Any bleeding problems you have. Any surgeries you  have had, including a pacemaker, defibrillator, or other implanted device. Any medical conditions you have. Whether you are pregnant or may be pregnant. What are the risks? Your provider will talk with you about risks. These include: Allergic reactions to medicines. Irritation to the skin on your chest or  back where the sticky pads (electrodes) or paddles were put during electrical cardioversion. A blood clot that breaks free and travels to other parts of your body, such as your brain. Return of a worse abnormal heart rhythm that will need to be treated with medicines, a pacemaker, or an implantable cardioverter defibrillator (ICD). What happens before the procedure? Medicines Your provider may give you: Blood-thinning medicines (anticoagulants) so your blood does not clot as easily. If your provider gives you this medicine, you may need to take it for 4 weeks before the procedure. Medicines to help stabilize your heart rate and rhythm. Ask your provider about: Changing or stopping your regular medicines. These include any diabetes medicines or blood thinners you take. Taking medicines such as aspirin  and ibuprofen. These medicines can thin your blood. Do not take them unless your provider tells you to. Taking over-the-counter medicines, vitamins, herbs, and supplements. General instructions Follow instructions from your provider about what you may eat and drink. Do not put any lotions, powders, or ointments on your chest and back for 24 hours before the procedure. They can cause problems with the electrodes or paddles used to deliver electricity to your heart. Do not wear jewelry as this can interfere with delivering electricity to your heart. If you will be going home right after the procedure, plan to have a responsible adult: Take you home from the hospital or clinic. You will not be allowed to drive. Care for you for the time you are told. Tests You may have an exam or testing. This may include: Blood labs. A transesophageal echocardiogram (TEE). What happens during the procedure?     An IV will be inserted into one of your veins. You will be given a sedative. This helps you relax. Electrodes or metal paddles will be placed on your chest. They may be placed in one of these ways: One  placed on your right chest, the other on the left ribs. One placed on your chest and the other on your back. An electrical shock will be delivered. The shock briefly stops (resets) your heart rhythm. Your provider will check to see if your heart rhythm is now normal. Some people need only one shock. Some need more to restore a normal heart rhythm. The procedure may vary among providers and hospitals. What happens after the procedure? Your blood pressure, heart rate, breathing rate, and blood oxygen level will be monitored until you leave the hospital or clinic. Your heart rhythm will be watched to make sure it does not change. This information is not intended to replace advice given to you by your health care provider. Make sure you discuss any questions you have with your health care provider. Document Revised: 07/18/2022 Document Reviewed: 07/18/2022 Elsevier Patient Education  2024 [employer]on  2020 [employer] Transesophageal Echocardiogram  Transesophageal echocardiogram, or TEE, is a test that uses sound waves to make pictures of your heart. TEE is done using a small ultrasound probe. The probe is passed down your esophagus, which is the part of your body that moves food from your mouth to your stomach. Because your heart is near your esophagus, the TEE will give clear pictures of your heart. Your health  care provider can use a TEE: To see how different parts of your heart are working. To check for problems with your heart, such as infection, blood clots, or growths. You may feel the probe in your throat, but the test usually doesn't cause pain or affect your breathing. Tell a health care provider about: Any allergies you have. All medicines you are taking. These include vitamins, herbs, eye drops, creams, and over-the-counter medicines. Any problems you or family members have had with anesthesia. Any bleeding problems you have. Any surgeries you have had. Any medical conditions  you have. Any trouble with swallowing. Whether you have or have had a blockage of the esophagus. Whether you're pregnant or may be pregnant. What are the risks? Your provider will talk with you about risks. These may include: Damage to nearby structures or organs. A tear of the esophagus. Fast or uneven heartbeats. A hoarse voice or trouble swallowing. Bleeding. What happens before the procedure? Medicines Ask about changing or stopping: Any medicines you take. Any vitamins, herbs, or supplements you take. Do not take aspirin  or ibuprofen unless you're told to. General instructions Follow instructions about what you may eat and drink. You will need to take out any dentures or dental retainers. Ask if you'll be staying overnight in the hospital. If you'll be going home right after the test, plan to have a responsible adult: Drive you home from the hospital or clinic. You won't be allowed to drive. Stay with you for the time you are told. What happens during the procedure?  An IV will be put into a vein in your hand or arm. You will be given: A sedative. This helps you relax. Anesthesia. This keeps you from feeling pain. It will be sprayed, or you'll gargle it, to numb the back of your throat. You may be asked to lie on your left side. A bite block will be put in your mouth. This keeps you from biting the probe. The tip of the probe will be placed into the back of your mouth. You'll be asked to swallow. Once the probe is in place, your provider will take pictures of your heart. The probe and bite block will be taken out after the test is done. The procedure may vary among providers and hospitals.  What can I expect after the procedure? You will be watched closely until you leave. This includes checking your blood pressure, heart rate, breathing rate, and blood oxygen level. Your throat may feel numb or sore. This will get better over time. You will not be allowed to eat or drink  until the numbness has gone away. Ask when your test results will be ready and how to get them. You may need to call or meet with your provider to discuss your results.  This information is not intended to replace advice given to you by your health care provider. Make sure you discuss any questions you have with your health care provider.  Document Revised: 08/28/2023 Document Reviewed: 02/05/2023 Elsevier Patient Education  2024 [employer].

## 2025-01-13 ENCOUNTER — Encounter (HOSPITAL_COMMUNITY): Payer: Self-pay | Admitting: Cardiology

## 2025-01-13 LAB — ECHO TEE

## 2025-01-19 ENCOUNTER — Ambulatory Visit (HOSPITAL_COMMUNITY)

## 2025-01-26 ENCOUNTER — Ambulatory Visit (HOSPITAL_COMMUNITY): Admitting: Nurse Practitioner

## 2025-02-03 ENCOUNTER — Ambulatory Visit

## 2025-03-16 ENCOUNTER — Ambulatory Visit: Payer: Self-pay

## 2025-03-22 ENCOUNTER — Ambulatory Visit: Admitting: Physician Assistant

## 2025-05-05 ENCOUNTER — Ambulatory Visit

## 2025-08-04 ENCOUNTER — Ambulatory Visit

## 2025-11-03 ENCOUNTER — Ambulatory Visit

## 2026-02-02 ENCOUNTER — Ambulatory Visit
